# Patient Record
Sex: Female | Born: 1980 | Race: White | Hispanic: No | Smoking: Former smoker
Health system: Southern US, Community
[De-identification: ages and names within clinical notes are randomized; demographics above are authoritative.]

## PROBLEM LIST (undated history)

## (undated) ENCOUNTER — Inpatient Hospital Stay (HOSPITAL_COMMUNITY): Payer: Self-pay

## (undated) DIAGNOSIS — O24419 Gestational diabetes mellitus in pregnancy, unspecified control: Secondary | ICD-10-CM

## (undated) DIAGNOSIS — F419 Anxiety disorder, unspecified: Secondary | ICD-10-CM

## (undated) DIAGNOSIS — E119 Type 2 diabetes mellitus without complications: Secondary | ICD-10-CM

## (undated) DIAGNOSIS — O139 Gestational [pregnancy-induced] hypertension without significant proteinuria, unspecified trimester: Secondary | ICD-10-CM

## (undated) DIAGNOSIS — I499 Cardiac arrhythmia, unspecified: Secondary | ICD-10-CM

## (undated) DIAGNOSIS — R319 Hematuria, unspecified: Secondary | ICD-10-CM

## (undated) DIAGNOSIS — K8020 Calculus of gallbladder without cholecystitis without obstruction: Secondary | ICD-10-CM

## (undated) DIAGNOSIS — F329 Major depressive disorder, single episode, unspecified: Secondary | ICD-10-CM

## (undated) DIAGNOSIS — N809 Endometriosis, unspecified: Secondary | ICD-10-CM

## (undated) DIAGNOSIS — Z3490 Encounter for supervision of normal pregnancy, unspecified, unspecified trimester: Principal | ICD-10-CM

## (undated) DIAGNOSIS — D649 Anemia, unspecified: Secondary | ICD-10-CM

## (undated) DIAGNOSIS — M549 Dorsalgia, unspecified: Secondary | ICD-10-CM

## (undated) DIAGNOSIS — R110 Nausea: Secondary | ICD-10-CM

## (undated) DIAGNOSIS — F32A Depression, unspecified: Secondary | ICD-10-CM

## (undated) DIAGNOSIS — J069 Acute upper respiratory infection, unspecified: Secondary | ICD-10-CM

## (undated) DIAGNOSIS — T8859XA Other complications of anesthesia, initial encounter: Secondary | ICD-10-CM

## (undated) DIAGNOSIS — O9989 Other specified diseases and conditions complicating pregnancy, childbirth and the puerperium: Secondary | ICD-10-CM

## (undated) DIAGNOSIS — T4145XA Adverse effect of unspecified anesthetic, initial encounter: Secondary | ICD-10-CM

## (undated) DIAGNOSIS — R51 Headache: Secondary | ICD-10-CM

## (undated) DIAGNOSIS — Z87442 Personal history of urinary calculi: Secondary | ICD-10-CM

## (undated) DIAGNOSIS — N926 Irregular menstruation, unspecified: Secondary | ICD-10-CM

## (undated) DIAGNOSIS — O26851 Spotting complicating pregnancy, first trimester: Secondary | ICD-10-CM

## (undated) HISTORY — DX: Encounter for supervision of normal pregnancy, unspecified, unspecified trimester: Z34.90

## (undated) HISTORY — DX: Nausea: R11.0

## (undated) HISTORY — DX: Other specified diseases and conditions complicating pregnancy, childbirth and the puerperium: O99.89

## (undated) HISTORY — DX: Spotting complicating pregnancy, first trimester: O26.851

## (undated) HISTORY — DX: Gestational (pregnancy-induced) hypertension without significant proteinuria, unspecified trimester: O13.9

## (undated) HISTORY — DX: Hematuria, unspecified: R31.9

## (undated) HISTORY — DX: Dorsalgia, unspecified: M54.9

## (undated) HISTORY — PX: WISDOM TOOTH EXTRACTION: SHX21

## (undated) HISTORY — DX: Anemia, unspecified: D64.9

## (undated) HISTORY — PX: MULTIPLE TOOTH EXTRACTIONS: SHX2053

## (undated) HISTORY — DX: Cardiac arrhythmia, unspecified: I49.9

## (undated) HISTORY — DX: Type 2 diabetes mellitus without complications: E11.9

---

## 2000-07-26 ENCOUNTER — Encounter: Payer: Self-pay | Admitting: Family Medicine

## 2000-07-26 ENCOUNTER — Ambulatory Visit (HOSPITAL_COMMUNITY): Admission: RE | Admit: 2000-07-26 | Discharge: 2000-07-26 | Payer: Self-pay | Admitting: Family Medicine

## 2000-11-27 ENCOUNTER — Other Ambulatory Visit: Admission: RE | Admit: 2000-11-27 | Discharge: 2000-11-27 | Payer: Self-pay | Admitting: *Deleted

## 2001-02-18 ENCOUNTER — Emergency Department (HOSPITAL_COMMUNITY): Admission: EM | Admit: 2001-02-18 | Discharge: 2001-02-18 | Payer: Self-pay | Admitting: Internal Medicine

## 2001-04-10 ENCOUNTER — Emergency Department (HOSPITAL_COMMUNITY): Admission: EM | Admit: 2001-04-10 | Discharge: 2001-04-10 | Payer: Self-pay | Admitting: Emergency Medicine

## 2001-09-19 ENCOUNTER — Emergency Department (HOSPITAL_COMMUNITY): Admission: EM | Admit: 2001-09-19 | Discharge: 2001-09-19 | Payer: Self-pay | Admitting: Emergency Medicine

## 2001-09-19 ENCOUNTER — Encounter: Payer: Self-pay | Admitting: Emergency Medicine

## 2001-09-23 ENCOUNTER — Encounter: Payer: Self-pay | Admitting: Family Medicine

## 2001-09-23 ENCOUNTER — Ambulatory Visit (HOSPITAL_COMMUNITY): Admission: RE | Admit: 2001-09-23 | Discharge: 2001-09-23 | Payer: Self-pay | Admitting: Family Medicine

## 2002-03-20 ENCOUNTER — Ambulatory Visit (HOSPITAL_COMMUNITY): Admission: RE | Admit: 2002-03-20 | Discharge: 2002-03-20 | Payer: Self-pay | Admitting: Family Medicine

## 2002-03-20 ENCOUNTER — Encounter: Payer: Self-pay | Admitting: Family Medicine

## 2002-08-31 ENCOUNTER — Encounter: Payer: Self-pay | Admitting: Family Medicine

## 2002-08-31 ENCOUNTER — Ambulatory Visit (HOSPITAL_COMMUNITY): Admission: RE | Admit: 2002-08-31 | Discharge: 2002-08-31 | Payer: Self-pay | Admitting: Family Medicine

## 2003-01-13 ENCOUNTER — Emergency Department (HOSPITAL_COMMUNITY): Admission: EM | Admit: 2003-01-13 | Discharge: 2003-01-13 | Payer: Self-pay | Admitting: Emergency Medicine

## 2003-09-03 ENCOUNTER — Ambulatory Visit (HOSPITAL_COMMUNITY): Admission: RE | Admit: 2003-09-03 | Discharge: 2003-09-03 | Payer: Self-pay | Admitting: *Deleted

## 2003-09-17 ENCOUNTER — Ambulatory Visit (HOSPITAL_COMMUNITY): Admission: RE | Admit: 2003-09-17 | Discharge: 2003-09-17 | Payer: Self-pay | Admitting: *Deleted

## 2003-11-15 ENCOUNTER — Ambulatory Visit (HOSPITAL_COMMUNITY): Admission: AD | Admit: 2003-11-15 | Discharge: 2003-11-15 | Payer: Self-pay | Admitting: *Deleted

## 2003-12-10 ENCOUNTER — Ambulatory Visit (HOSPITAL_COMMUNITY): Admission: AD | Admit: 2003-12-10 | Discharge: 2003-12-10 | Payer: Self-pay | Admitting: *Deleted

## 2004-01-21 ENCOUNTER — Ambulatory Visit (HOSPITAL_COMMUNITY): Admission: AD | Admit: 2004-01-21 | Discharge: 2004-01-21 | Payer: Self-pay | Admitting: *Deleted

## 2004-01-24 ENCOUNTER — Inpatient Hospital Stay (HOSPITAL_COMMUNITY): Admission: AD | Admit: 2004-01-24 | Discharge: 2004-01-26 | Payer: Self-pay | Admitting: *Deleted

## 2004-06-11 ENCOUNTER — Emergency Department (HOSPITAL_COMMUNITY): Admission: EM | Admit: 2004-06-11 | Discharge: 2004-06-11 | Payer: Self-pay | Admitting: Family Medicine

## 2004-10-27 ENCOUNTER — Ambulatory Visit (HOSPITAL_COMMUNITY): Admission: RE | Admit: 2004-10-27 | Discharge: 2004-10-27 | Payer: Self-pay | Admitting: *Deleted

## 2005-01-23 ENCOUNTER — Ambulatory Visit (HOSPITAL_COMMUNITY): Admission: AD | Admit: 2005-01-23 | Discharge: 2005-01-23 | Payer: Self-pay | Admitting: *Deleted

## 2005-01-23 ENCOUNTER — Ambulatory Visit (HOSPITAL_COMMUNITY): Admission: RE | Admit: 2005-01-23 | Discharge: 2005-01-23 | Payer: Self-pay | Admitting: *Deleted

## 2005-02-25 ENCOUNTER — Ambulatory Visit (HOSPITAL_COMMUNITY): Admission: RE | Admit: 2005-02-25 | Discharge: 2005-02-25 | Payer: Self-pay | Admitting: *Deleted

## 2005-03-09 ENCOUNTER — Inpatient Hospital Stay (HOSPITAL_COMMUNITY): Admission: AD | Admit: 2005-03-09 | Discharge: 2005-03-11 | Payer: Self-pay | Admitting: *Deleted

## 2006-04-22 ENCOUNTER — Emergency Department (HOSPITAL_COMMUNITY): Admission: EM | Admit: 2006-04-22 | Discharge: 2006-04-22 | Payer: Self-pay | Admitting: Emergency Medicine

## 2006-05-17 ENCOUNTER — Emergency Department (HOSPITAL_COMMUNITY): Admission: EM | Admit: 2006-05-17 | Discharge: 2006-05-17 | Payer: Self-pay | Admitting: Emergency Medicine

## 2006-05-18 ENCOUNTER — Ambulatory Visit (HOSPITAL_COMMUNITY): Admission: RE | Admit: 2006-05-18 | Discharge: 2006-05-18 | Payer: Self-pay | Admitting: Family Medicine

## 2006-06-01 ENCOUNTER — Emergency Department (HOSPITAL_COMMUNITY): Admission: EM | Admit: 2006-06-01 | Discharge: 2006-06-01 | Payer: Self-pay | Admitting: Emergency Medicine

## 2006-07-05 ENCOUNTER — Ambulatory Visit (HOSPITAL_COMMUNITY): Admission: RE | Admit: 2006-07-05 | Discharge: 2006-07-05 | Payer: Self-pay | Admitting: Family Medicine

## 2006-08-06 ENCOUNTER — Ambulatory Visit (HOSPITAL_COMMUNITY): Admission: RE | Admit: 2006-08-06 | Discharge: 2006-08-06 | Payer: Self-pay | Admitting: Family Medicine

## 2007-01-19 ENCOUNTER — Inpatient Hospital Stay (HOSPITAL_COMMUNITY): Admission: AD | Admit: 2007-01-19 | Discharge: 2007-01-19 | Payer: Self-pay | Admitting: Obstetrics & Gynecology

## 2007-01-20 ENCOUNTER — Other Ambulatory Visit: Admission: RE | Admit: 2007-01-20 | Discharge: 2007-01-20 | Payer: Self-pay | Admitting: Obstetrics and Gynecology

## 2007-03-24 ENCOUNTER — Ambulatory Visit (HOSPITAL_COMMUNITY): Admission: RE | Admit: 2007-03-24 | Discharge: 2007-03-24 | Payer: Self-pay | Admitting: Obstetrics and Gynecology

## 2007-04-18 ENCOUNTER — Ambulatory Visit (HOSPITAL_COMMUNITY): Admission: RE | Admit: 2007-04-18 | Discharge: 2007-04-18 | Payer: Self-pay | Admitting: Obstetrics and Gynecology

## 2007-05-30 ENCOUNTER — Ambulatory Visit (HOSPITAL_COMMUNITY): Admission: RE | Admit: 2007-05-30 | Discharge: 2007-05-30 | Payer: Self-pay | Admitting: Obstetrics and Gynecology

## 2007-06-10 ENCOUNTER — Inpatient Hospital Stay (HOSPITAL_COMMUNITY): Admission: AD | Admit: 2007-06-10 | Discharge: 2007-06-11 | Payer: Self-pay | Admitting: Obstetrics & Gynecology

## 2007-06-11 ENCOUNTER — Ambulatory Visit: Payer: Self-pay | Admitting: *Deleted

## 2007-06-27 ENCOUNTER — Ambulatory Visit (HOSPITAL_COMMUNITY): Admission: RE | Admit: 2007-06-27 | Discharge: 2007-06-27 | Payer: Self-pay | Admitting: Obstetrics and Gynecology

## 2007-06-29 ENCOUNTER — Ambulatory Visit: Payer: Self-pay | Admitting: Obstetrics and Gynecology

## 2007-06-29 ENCOUNTER — Inpatient Hospital Stay (HOSPITAL_COMMUNITY): Admission: AD | Admit: 2007-06-29 | Discharge: 2007-06-29 | Payer: Self-pay | Admitting: Obstetrics & Gynecology

## 2007-07-11 ENCOUNTER — Ambulatory Visit (HOSPITAL_COMMUNITY): Admission: RE | Admit: 2007-07-11 | Discharge: 2007-07-11 | Payer: Self-pay | Admitting: Obstetrics and Gynecology

## 2007-07-26 ENCOUNTER — Emergency Department (HOSPITAL_COMMUNITY): Admission: EM | Admit: 2007-07-26 | Discharge: 2007-07-27 | Payer: Self-pay | Admitting: Emergency Medicine

## 2007-08-08 ENCOUNTER — Ambulatory Visit (HOSPITAL_COMMUNITY): Admission: RE | Admit: 2007-08-08 | Discharge: 2007-08-08 | Payer: Self-pay | Admitting: Obstetrics and Gynecology

## 2007-08-15 ENCOUNTER — Ambulatory Visit: Payer: Self-pay | Admitting: Obstetrics and Gynecology

## 2007-08-15 ENCOUNTER — Encounter: Payer: Self-pay | Admitting: Obstetrics and Gynecology

## 2007-08-15 ENCOUNTER — Inpatient Hospital Stay (HOSPITAL_COMMUNITY): Admission: AD | Admit: 2007-08-15 | Discharge: 2007-08-15 | Payer: Self-pay | Admitting: Obstetrics and Gynecology

## 2007-08-18 ENCOUNTER — Ambulatory Visit: Payer: Self-pay | Admitting: Obstetrics and Gynecology

## 2007-08-18 ENCOUNTER — Inpatient Hospital Stay (HOSPITAL_COMMUNITY): Admission: AD | Admit: 2007-08-18 | Discharge: 2007-08-20 | Payer: Self-pay | Admitting: Obstetrics & Gynecology

## 2007-08-18 DIAGNOSIS — O24419 Gestational diabetes mellitus in pregnancy, unspecified control: Secondary | ICD-10-CM

## 2009-03-30 ENCOUNTER — Emergency Department (HOSPITAL_COMMUNITY): Admission: EM | Admit: 2009-03-30 | Discharge: 2009-03-30 | Payer: Self-pay | Admitting: Family Medicine

## 2009-11-22 ENCOUNTER — Emergency Department (HOSPITAL_COMMUNITY): Admission: EM | Admit: 2009-11-22 | Discharge: 2009-11-22 | Payer: Self-pay | Admitting: Family Medicine

## 2010-08-22 NOTE — H&P (Signed)
NAMEJONNELL, HENTGES               ACCOUNT NO.:  [id number]   MEDICAL RECORD NO.:  [id number]          PATIENT TYPE:  INP   LOCATION:  9113                          FACILITY:  [facility]   PHYSICIAN:  Tilda Burrow, M.D. DATE OF BIRTH:  12/09/1980   DATE OF ADMISSION:  08/18/2007  DATE OF DISCHARGE:                              HISTORY & PHYSICAL   ADMITTING DIAGNOSIS:  Pregnancy at 37 weeks and 4 days, early labor.   HISTORY OF PRESENT ILLNESS:  This 30 year old gravida 5, para 3-0-1-3,  LMP October 12, 2006,  with [facility] Sep 04, 2007, is admitted after a pregnancy  course followed through our office at [facility].  She has been  seen through 14 prenatal visits with blood type A negative, antibody  screen negative, hemoglobin 14, and hematocrit 45; hepatitis C, HIV,  RPR, GC, and chlamydia all negative; rubella immunity present, and  varicella immunity present.  She has a negative group B strep, and  negative repeat gonorrhea and chlamydia cultures.  A 20-week glucose  tolerance test 119 mg/dL.  Today, she presents to our office with  prodromal labor symptoms.  She has been seen in labor in the MAU last  week throughout labor.  Today, she is progressing 3-4 cm.  Her membranes  are intact.  She is referred to [facility] at [facility] for labor  management with cervix 4 cm, 50, -1.   PAST MEDICAL HISTORY:  Benign.   SURGICAL HISTORY:  Negative.   She had pregnancy course also notable for detailed fetal survey due to  an increased risk of ONTD based on the MSAFP.  This fetal screen was  negative for anomalies.  Recheck was similarly negative.  The patient is  admitted for labor management.   PHYSICAL EXAMINATION:  VITAL SIGNS:  Height 5 feet 8 inches, weight 187,  and blood pressure 132/88.   LABORATORY DATA:  Urinalysis negative.  NST reactive.  Cervix 4 cm, 50,  -1.   PLAN:  Admit to [facility] at [facility] with good prognosis for  vaginal delivery promptly.   ADDENDUM  The patient is uncertain regarding tubal ligation plans versus  vasectomy.      Tilda Burrow, M.D.  Electronically Signed     JVF/MEDQ  D:  08/18/2007  T:  08/19/2007  Job:  [id number]   cc:   [facility]

## 2010-08-25 NOTE — Op Note (Signed)
[name], BENTLY               ACCOUNT NO.:  [id number]   MEDICAL RECORD NO.:  [id number]          PATIENT TYPE:  INP   LOCATION:                                FACILITY:  [facility]   PHYSICIAN:  Langley Gauss, M.D.   DATE OF BIRTH:  1980-11-13   DATE OF PROCEDURE:  DATE OF DISCHARGE:                                 OPERATIVE REPORT   PROCEDURE:  Placement of epidural analgesia at L3-L4 interspace performed by  Dr. Roylene Reason. Lisette Grinder.   COMPLICATIONS:  None.   DESCRIPTION OF PROCEDURE:  An appropriate and informed consent was obtained.  The patient had had epidural placed with previous labor and deliveries.  She  was placed in the seated position.  Immediately prior the cervix had been  examined, 3 cm dilated, 70% effaced, 0 station, vertex presentation  confirmed.  Fetal scalp electrode placed with the resultant amniotomy and  findings of clear amniotic fluid.   In a seated position bony landmarks were identified.  The back is sterilely  prepped and draped utilizing the epidural kit; 5 cc of 1% lidocaine injected  at the midline of the L3-L4 interspace.  A 17-gauge Touhy-Schliff needle was  then utilized with loss of resistance in air-filled glass syringe to  identify entry into the epidural space on the first attempt without  difficulty.  Initial test dose of 5 cc of 1.5% lidocaine plus epinephrine  injected through the epidural needle.  No signs of CSF or intravascular  injection.  A second test dose of 2 cc of 1.5% lidocaine plus epinephrine  injected through the epidural needle after insertion of the catheter to a  depth of 5 cm.  Aspiration test is negative. Catheter is secured into place.  The patient is having evidence of a bilateral block in place.   She is thus connected to the infusion pump with the standard mixture.  She  will be started on continuous infusion of 14 cc/hour.  Upon return to the  lateral supine position the patient does have evidence of a bilateral  block  in place.       ___________________________________________  Langley Gauss, M.D.    DC/MEDQ  D:  01/24/2004  T:  01/24/2004  Job:  [id number]

## 2010-08-25 NOTE — Op Note (Signed)
[name], [name]               ACCOUNT NO.:  [id number]   MEDICAL RECORD NO.:  [id number]          PATIENT TYPE:  INP   LOCATION:  LDR1                          FACILITY:  [facility]   PHYSICIAN:  Langley Gauss, MD     DATE OF BIRTH:  1980-05-10   DATE OF PROCEDURE:  03/09/2005  DATE OF DISCHARGE:                                 OPERATIVE REPORT   PROCEDURE:  Placement of continuous lumbar epidural analgesia at the L3-4  interspace, performed by Dr. Roylene Reason. Lisette Grinder.   COMPLICATIONS:  None.   SPECIMENS:  None.   EPIDURAL START TIME:  1330 hours.   EPIDURAL DISCONTINUATION TIME:  1830 hours.   SUMMARY:  Appropriate informed consent was obtained.  The patient had  epidural placed with previous labor and delivery.  She is placed in the  seated position, at which time bony landmarks are identified.  The L3-4  interspace is chosen.  The patient's back is sterilely prepped and draped,  utilizing epidural tray, and 5 cc 1% lidocaine injected at the midline of  the L3-4 interspace, __________ small skin wheal.  The 17 gauge Tuohy-Schiff  needle is then utilized with loss of resistance in an air-filled glass  syringe to identify and use the atraumatic entry into the epidural space on  the first attempt without difficulty.  Excellent loss of resistance is  noted.  Initial test dose 5 cc, 1.5% lidocaine plus epinephrine injected  through the epidural needle.  No signs of CSF or intravascular injection  obtained.  Thus, the catheter is inserted to a depth of 5 cm.  The needle is  removed.  Aspiration test is negative.  Second test dose 2 cc, 1.5%  lidocaine plus epinephrine injected through the epidural catheter.  Again,  no signs of CSF or intravascular injection obtained.  The catheter is  secured into place.  The patient is having an obvious block setting up on  the right side, but the left side is slower to respond.  The patient is  connected to the infusion pump, contained in a  standard mixture at an  infusion rate of 14 cc/hr and returned to the lateral supine position.  Fetal heart rates remained reassuring throughout the procedure.      Langley Gauss, MD  Electronically Signed     DC/MEDQ  D:  03/09/2005  T:  03/09/2005  Job:  [id number]

## 2010-08-25 NOTE — H&P (Signed)
[name], [name]               ACCOUNT NO.:  [id number]   MEDICAL RECORD NO.:  [id number]           PATIENT TYPE:   LOCATION:                                 FACILITY:   PHYSICIAN:  Langley Gauss, M.D.        DATE OF BIRTH:   DATE OF ADMISSION:  01/24/2004  DATE OF DISCHARGE:  LH                                HISTORY & PHYSICAL   HISTORY OF PRESENT ILLNESS:  This is a 30 year old gravida 2, para 1, at 38+  weeks gestation.  The patient is admitted for induction of labor due to  psychosocial factors.  Her husband, Bernadene Bell, works for [employer].  He has made special arrangements to be available in town for the  labor and delivery, where as the remainder of his work crew has currently  traveled to [place] to repair storm damage.  The patient's prenatal course  was complicated by some uncertainty regarding her date as her last menstrual  period was on arrival.  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  She was treated with Clomid x1 month duration, but  failed to conceive during that cycle.  Subsequently, she was able to  conceive on the cycle following the Clomid induced cycle.  She did have some  first trimester nausea with resultant weight loss, but overall was able to  gain 18 pounds during the entirety of the pregnancy.  She is noted to be A  negative blood type.  She did receive full dose RhoGAM dated November 02, 2003.  She otherwise has had ultrasounds which have shown documented normal  anatomic survey and good fetal growth.  She is noted to be hepatitis  negative, RPR is non-reactive, HIV is negative.  GC and Chlamydia cultures  negative.  Pap class I dated Aug 09, 2003.   PAST OBSTETRICAL HISTORY:  She had a vaginal delivery on July 28, 1999, 6  pound 4 ounce female infant.  She utilized epidural analgesic, delivered  over a first degree perineal laceration at that time.   ALLERGIES:  No known drug allergies.   CURRENT  MEDICATIONS:  Prenatal vitamins only.   PHYSICAL EXAMINATION:  GENERAL:  A pleasant white female.  VITAL SIGNS:  Weight is 178.5 pounds, blood pressure 120/82, pulse rate of  80, respiratory rate is 20.  HEENT:  Negative.  NECK:  No adenopathy, supple.  Thyroid is not palpable.  LUNGS:  Clear.  CARDIOVASCULAR:  Regular rate and rhythm.  ABDOMEN:  Soft and nontender.  No surgical scars are identified.  She has a  vertex presentation by Leopold's maneuvers.  The fetal heart tones are  auscultated in the 150s.  EXTREMITIES:  Normal.  PELVIC:  Normal external genitalia, no lesions or ulcerations identified.  No vaginal bleeding, no leakage of fluid.  Cervix noted to be 1+ cm dilated,  0 station, 60% effaced, with the vertex well applied.   LABORATORY DATA:  GBS carrier status is negative.   SOCIAL HISTORY:  The patient does plan on both breast-feeding and bottle-  feeding.  She would like to utilize either pills or patch for postpartum  birth control purposes.  She will be utilizing the pediatrician on call and  subsequent care provided at [facility].   ASSESSMENT AND PLAN:  A 38+ weeks intrauterine pregnancy.  Previous labor  and delivery went very well utilizing epidural analgesic.   PLAN:  I am admitting the patient on January 24, 2004.  Subsequently,  amniotomy can be performed.  The patient can be managed expectantly  subsequently thereafter augmentation or induction with Pitocin if clinically  indicated.  The patient had an epidural placed with previous labor and  delivery, and plans on utilizing epidural during this course of labor and  delivery.       ___________________________________________  Langley Gauss, M.D.    DC/MEDQ  D:  01/20/2004  T:  01/20/2004  Job:  [id number]

## 2010-08-25 NOTE — Op Note (Signed)
[name], [name]               ACCOUNT NO.:  [id number]   MEDICAL RECORD NO.:  [id number]          PATIENT TYPE:  INP   LOCATION:  A412                          FACILITY:  [facility]   PHYSICIAN:  Langley Gauss, MD     DATE OF BIRTH:  [birth year]   DATE OF PROCEDURE:  03/09/2005  DATE OF DISCHARGE:                                 OPERATIVE REPORT   DELIVERY NOTE:   DIAGNOSIS:  A 39-week intrauterine pregnancy for induction of labor.   PROCEDURES:  1.  Placement of continuous lumbar epidural analgesia.  2.  Spontaneous, assisted vaginal delivery of 7-pounds 10-ounce female      infant, delivered over an intact perineum.   ATTENDING PHYSICIAN:  Delivery performed by Dr. Langley Gauss.   ESTIMATED BLOOD LOSS:  Less than 500 cc.   COMPLICATIONS:  None.   SPECIMENS:  Arterial cord gas and cord blood to laboratory. The placenta is  examined, noted be apparently intact, with a three-vessel umbilical cord.   FINDINGS AT TIME OF DELIVERY:  A nuchal cord x1, which had resulted in some  mild variable decelerations during the second phase of labor.   SUMMARY:  The patient is admitted for induction of labor at 87 weeks'  gestation. On initial examination, she was noted to be 3-cm dilated, 70%  effaced, -1 station with the vertex well applied to the cervix, amniotomy  was performed at that time with findings of clear amniotic fluid. Fetal  scalp electrode was placed. The patient did have a reassuring fetal heart  rate. The patient planned on having an epidural placed with this labor and  delivery, as she had with two previous.  She has had a history of a very  rapid labor and delivery, so very shortly after starting the Pitocin, the  epidural was placed.  The epidural provided complete analgesia on the right.  The patient did have still some pain experienced on her left side.  She,  however, did progress rapidly to complete dilatation.  She was pushing in  the labor pad at complete  dilatation, pushing rather poorly with minimal  effort and minimal descent. Thus, I placed her in the dorsal lithotomy  position at which time her expulsive efforts improved.  She pushed well at  this time during this second stage of labor to delivery in a direct O-A  position over an intact perineum.   Mouth and nares were bulb-suctioned of clear amniotic fluid.  Renewed,  expulsive efforts resulted in spontaneous rotation to her right anterior  shoulder position. A nuchal cord x1 is reduced prior to delivery of the  shoulders. Gentle, downward traction combined with expulsive efforts,  resulted in delivery of this anterior shoulder as well as the remainder of  the infant without difficulty.  Umbilical cord is milked toward the infant,  cord is doubly clamped and cut.  Infant is placed on maternal abdomen for  immediate bonding purposes. Excellent uterine tone is noted. Arterial cord  gas and cord blood were then obtained.  Gentle traction on the umbilical  cord results in separation, which upon examination,  appears to be an intact  placenta with associated three-vessel umbilical cord. Again, excellent  uterine tone is still achieved.  Examination of the genital tract, at this  time, reveals a very minimal first-degree perineal laceration.  No other  lacerations are noted.  No episiotomy was performed.  The mother and infant  both doing well following delivery.  The patient is taken out dorsal  lithotomy position, and rolled to her side, at which time the epidural  catheter was removed, with the boot tip noted to be intact.      Langley Gauss, MD  Electronically Signed     DC/MEDQ  D:  03/09/2005  T:  03/10/2005  Job:  [id number]

## 2010-08-25 NOTE — H&P (Signed)
[name], [name]               ACCOUNT NO.:  [id number]   MEDICAL RECORD NO.:  [id number]          PATIENT TYPE:  INP   LOCATION:                                FACILITY:  [facility]   PHYSICIAN:  Langley Gauss, MD     DATE OF BIRTH:  [birth year]   DATE OF ADMISSION:  03/09/2005  DATE OF DISCHARGE:  LH                                HISTORY & PHYSICAL   The patient is a 30 year old gravida 4, para 2 at 38-1/[redacted] weeks gestation who  is admitted for induction of labor secondary to psychosocial factors.  Pertinently, the patient is noted to have two other living children who will  require child care. Additionally, she would like to schedule such that her  husband who frequently travels out of town to work on an emergency disaster  clean-up crew to be present at time of labor and delivery. Her cervix is  noted to be very favorable.   The patient's prenatal course. She is noted to be O negative. She did  receive full dose RhoGAM December 08, 2004. She has had serial ultrasounds  which have documented adequate fetal growth. GBS carrier status is negative.  Serial ultrasounds documented normal anatomical survey and adequate fetal  growth. Glucose tolerance test was normal in 2001. She has no high-risk  factors for abnormal GTT. Thus, it is not repeated during this pregnancy.   PAST MEDICAL HISTORY:  She has no known drug allergies.   SOCIAL HISTORY:  She is described as a homemaker, a nonsmoker. Her husband,  Bernadene Bell, works for [employer].   OBSTETRICAL HISTORY:  July 10, 1999, vaginal delivery 6-pound 4-ounce infant  utilizing epidural. January 24, 2004, vaginal delivery 6-pound 10.4-ounce  infant delivered utilizing epidural; that epidural was suboptimally  functioning as the catheter was noted to be kinked with limited flow of  medication during the active course of labor. She has no other medical or  surgical history. She does plan on both bottle and breast feeding. She  would  like to use Dr. Milford Cage for newborn pediatric services. She declines  sterilization, would like to use oral contraceptives for postpartum birth  control purposes.   PHYSICAL EXAMINATION:  VITAL SIGNS:  Prepregnancy 170, most recently is 187.  Blood pressure is 128/82.  HEENT:  Negative. No adenopathy. Neck is supple. Thyroid is nonpalpable.  LUNGS:  Clear.  CARDIOVASCULAR:  Regular rate and rhythm.  ABDOMEN:  Soft and nontender. No surgical scars are identified. She is  vertex presentation by Leopold's maneuvers. Fundal height is 36 cm. Fetal  tones are auscultated in the 150s.  EXTREMITIES:  Minimal edema identified.  PELVIC:  Normal external genitalia. No lesions or ulcerations identified. No  vaginal bleeding. No leakage of fluid. Pelvis is clinical adequate on  examination. Cervix is noted to be nearly 3 cm dilated, 80% effaced, with  the vertex well applied at a 0 station.   ASSESSMENT/PLAN:  A 38-1/2 week intrauterine pregnancy, very favorable  cervix. Additionally, the patient is noted to have history of very rapid  labor and delivery with previous  delivery occurring fours hours after  amniotomy. The patient to be referred to [facility] March 09, 2005. Can  initially proceed with amniotomy and then observe  patient for onset of active labor. If active labor does not ensue, can  augment or induce with Pitocin as clinically indicated. As per two previous  labor and deliveries, the patient very well may request epidural during this  course of labor and delivery.      Langley Gauss, MD  Electronically Signed     DC/MEDQ  D:  03/06/2005  T:  03/06/2005  Job:  [id number]

## 2010-08-25 NOTE — Op Note (Signed)
NAMERHODIA, ACRES               ACCOUNT NO.:  [id number]   MEDICAL RECORD NO.:  [id number]          PATIENT TYPE:  INP   LOCATION:  A401                          FACILITY:  [facility]   PHYSICIAN:  Langley Gauss, M.D.   DATE OF BIRTH:  02-17-1981   DATE OF PROCEDURE:  01/24/2004  DATE OF DISCHARGE:                                 OPERATIVE REPORT   DELIVERY NOTE:   PROCEDURES:  1.  Placement of continuous lumbar epidural analgesia.  2.  Spontaneous assisted vaginal delivery of a viable, vigorous female      infant, weight unavailable, delivered over an intact perineum.   Analgesia at time of delivery, a total of 5, 5 mL 2% lidocaine plane was  injected through the epidural.   COMPLICATIONS:  She did have some moderate variable decelerations during the  second stage of labor.  At time of delivery, a nuchal cord x2 was noted,  which was reduced.   ESTIMATED BLOOD LOSS:  Less than 500 mL.   SPECIMENS:  Arterial cord gas and cord blood to laboratory.  The placenta  was examined and noted to be apparently intact with a three-vessel umbilical  cord.   SUMMARY:  The patient had been admitted for induction of labor.  Initially  IV Pitocin induction was initiated, amniotomy performed.  Epidural was  placed without difficulty.  However, despite excellent functioning of the  epidural initially, it became apparent that during the active phase of labor  the epidural was nonfunctional for labor purposes as error message on  epidural pump was that of occlusion.  Thus, the patient was treated with IV  Nubain and Phenergan as I completed office space responsibilities.  When I  arrived to administer a bolus to the patient, she was noted to be actively  laboring, progressing very rapidly from 3 to 8 cm.  At time of evaluation  she was noted to be 9 cm dilated.  Lidocaine 2% plain 5, 5mL injected  through the epidural.  The patient is then placed in the dorsal lithotomy  position, prepped and  draped in the usual sterile manner.  Foley catheter is  removed.  The patient pushed well during the extremely short second stage of  labor with excellent perineal support.  The infant was noted to deliver in a  direct OA position over the intact perineum.  Spontaneous rotation occurred  to a left anterior shoulder position, nuchal cord x2 was reduced.  Expulsive  efforts plus gentle downward traction resulted in delivery of this anterior  shoulder as well as the remainder of the infant without difficulty.  Umbilical cord is milked toward the infant, cord is doubly clamped and cut.  The infant is placed on the maternal abdomen for immediate bonding purposes.  Arterial cord gas and cord blood then obtained.  Gentle traction on the  umbilical cord results in separation, which upon examination is intact  placenta plus a three-vessel umbilical cord.  Excellent uterine tone is  achieved.  Examination of the genital tract reveals intact perineum, no  lacerations noted.  Excellent uterine tone is  achieved.  The patient is thus taken out of the dorsal lithotomy position  and rolled to her side, at which time the epidural catheter is removed with  the blue tip noted to be intact.  Both mother and infant doing well  following delivery.      DC/MEDQ  D:  01/24/2004  T:  01/25/2004  Job:  [id number]

## 2010-08-25 NOTE — Discharge Summary (Signed)
[name], [name]               ACCOUNT NO.:  [id number]   MEDICAL RECORD NO.:  [id number]          PATIENT TYPE:  INP   LOCATION:  A401                          FACILITY:  [facility]   PHYSICIAN:  Langley Gauss, MD     DATE OF BIRTH:  05/12/1980   DATE OF ADMISSION:  01/24/2004  DATE OF DISCHARGE:  10/19/2005LH                                 DISCHARGE SUMMARY   PROCEDURES:  01/24/2004:  Placement of continuous lumbar epidural analgesia.  01/24/2004:  Spontaneous assisted vaginal delivery.   DISPOSITION:  Given a copy of standarized discharge instructions at the time  of discharge.  Follow up in the office in 4 weeks' time.   PERTINENT LABORATORY STUDIES:  Hemoglobin and hematocrit 9.9/29.0 with a  white count of 13.6; postpartum day #1:  9.8/29.4 with a white count of 9.1.   DISCHARGE MEDICATION:  Tylox #30. The patient does have significant problems  swallowing meds; however, she has done well this hospitalization with  crushing Tylox and mixing them with food.   HOSPITAL COURSE:  See previous dictations.  The patient actually delivered  on 01/24/2004.  Postpartum she did well. She had Neosporin ointment  postpartum complications; bonded well with the infant; and was thus  discharged home on postpartum day one and one-half.     [name]   DC/MEDQ  D:  02/02/2004  T:  02/02/2004  Job:  [id number]

## 2010-08-25 NOTE — Discharge Summary (Signed)
[name], [name]               ACCOUNT NO.:  [id number]   MEDICAL RECORD NO.:  [id number]          PATIENT TYPE:  INP   LOCATION:  A412                          FACILITY:  [facility]   PHYSICIAN:  Langley Gauss, MD     DATE OF BIRTH:  02/16/1981   DATE OF ADMISSION:  03/09/2005  DATE OF DISCHARGE:  12/03/2006LH                                 DISCHARGE SUMMARY   DATE OF EPIDURAL AND VAGINAL DELIVERY:  March 09, 2005.   DISPOSITION:  Patient is given a copy of standard discharge instructions at  time of discharge.  Follow up in the office in 4 weeks' time for postpartum  visit and discussion of birth control options.   PERTINENT LABORATORY STUDIES:  Admission hemoglobin and hematocrit 9.1 and  27.1; postpartum day #1, 8.4 and 25.8.   ADDITIONAL DIAGNOSIS:  Anemia secondary to iron deficiency and blood loss.   HOSPITAL COURSE:  See previous dictations.  Patient admitted on March 09, 2005 for medically indicated induction of labor.  She progressed rapidly  along the labor curve to an uncomplicated vaginal delivery utilizing  epidural analgesic.  Postpartum patient did well.  She bonded well with the  infant and had no postpartum complications and was thus discharged to home  on postpartum day one and one half.      Langley Gauss, MD  Electronically Signed     DC/MEDQ  D:  04/13/2005  T:  04/13/2005  Job:  [id number]

## 2011-01-01 LAB — CK TOTAL AND CKMB (NOT AT ARMC)
CK, MB: 1.3
Relative Index: INVALID
Total CK: 42

## 2011-01-02 LAB — DIFFERENTIAL
Basophils Absolute: 0
Basophils Relative: 0
Eosinophils Absolute: 0.1
Monocytes Absolute: 1.1 — ABNORMAL HIGH
Monocytes Relative: 10
Neutro Abs: 7.8 — ABNORMAL HIGH
Neutrophils Relative %: 71

## 2011-01-02 LAB — CBC
RBC: 3.51 — ABNORMAL LOW
RDW: 16.3 — ABNORMAL HIGH
WBC: 11.1 — ABNORMAL HIGH

## 2011-01-02 LAB — BASIC METABOLIC PANEL
Calcium: 8.7
Chloride: 104
Creatinine, Ser: 0.44

## 2011-01-02 LAB — URINE MICROSCOPIC-ADD ON

## 2011-01-02 LAB — URINALYSIS, ROUTINE W REFLEX MICROSCOPIC
Bilirubin Urine: NEGATIVE
Glucose, UA: NEGATIVE
Hgb urine dipstick: NEGATIVE
Nitrite: NEGATIVE
Specific Gravity, Urine: 1.015

## 2011-01-18 LAB — URINALYSIS, ROUTINE W REFLEX MICROSCOPIC
Nitrite: NEGATIVE
Urobilinogen, UA: 2 — ABNORMAL HIGH
pH: 6.5

## 2011-09-17 ENCOUNTER — Inpatient Hospital Stay (HOSPITAL_COMMUNITY): Payer: Medicaid Other

## 2011-09-17 ENCOUNTER — Inpatient Hospital Stay (HOSPITAL_COMMUNITY)
Admission: AD | Admit: 2011-09-17 | Discharge: 2011-09-17 | Disposition: A | Discharge status: Home / Self Care | Payer: Medicaid Other | Source: Ambulatory Visit | Attending: Obstetrics and Gynecology | Admitting: Obstetrics and Gynecology

## 2011-09-17 ENCOUNTER — Encounter (HOSPITAL_COMMUNITY): Payer: Self-pay | Admitting: *Deleted

## 2011-09-17 DIAGNOSIS — O2390 Unspecified genitourinary tract infection in pregnancy, unspecified trimester: Secondary | ICD-10-CM | POA: Insufficient documentation

## 2011-09-17 DIAGNOSIS — O2340 Unspecified infection of urinary tract in pregnancy, unspecified trimester: Secondary | ICD-10-CM

## 2011-09-17 DIAGNOSIS — Z1389 Encounter for screening for other disorder: Secondary | ICD-10-CM

## 2011-09-17 DIAGNOSIS — Z3490 Encounter for supervision of normal pregnancy, unspecified, unspecified trimester: Secondary | ICD-10-CM

## 2011-09-17 DIAGNOSIS — N390 Urinary tract infection, site not specified: Secondary | ICD-10-CM | POA: Insufficient documentation

## 2011-09-17 DIAGNOSIS — R109 Unspecified abdominal pain: Secondary | ICD-10-CM | POA: Insufficient documentation

## 2011-09-17 HISTORY — DX: Depression, unspecified: F32.A

## 2011-09-17 HISTORY — DX: Irregular menstruation, unspecified: N92.6

## 2011-09-17 HISTORY — DX: Other complications of anesthesia, initial encounter: T88.59XA

## 2011-09-17 HISTORY — DX: Adverse effect of unspecified anesthetic, initial encounter: T41.45XA

## 2011-09-17 HISTORY — DX: Acute upper respiratory infection, unspecified: J06.9

## 2011-09-17 HISTORY — DX: Headache: R51

## 2011-09-17 HISTORY — DX: Major depressive disorder, single episode, unspecified: F32.9

## 2011-09-17 LAB — POCT PREGNANCY, URINE: Preg Test, Ur: POSITIVE — AB

## 2011-09-17 LAB — HCG, QUANTITATIVE, PREGNANCY: hCG, Beta Chain, Quant, S: 22712 m[IU]/mL — ABNORMAL HIGH (ref <5)

## 2011-09-17 LAB — CBC
HCT: 37.1 % (ref 36.0–46.0)
MCV: 83.6 fL (ref 78.0–100.0)
RBC: 4.44 MIL/uL (ref 3.87–5.11)
WBC: 8 10*3/uL (ref 4.0–10.5)

## 2011-09-17 LAB — URINALYSIS, ROUTINE W REFLEX MICROSCOPIC: Nitrite: NEGATIVE

## 2011-09-17 LAB — DIFFERENTIAL
Eosinophils Relative: 1 % (ref 0–5)
Lymphocytes Relative: 17 % (ref 12–46)
Lymphs Abs: 1.4 10*3/uL (ref 0.7–4.0)
Monocytes Absolute: 0.8 10*3/uL (ref 0.1–1.0)

## 2011-09-17 LAB — URINE MICROSCOPIC-ADD ON

## 2011-09-17 MED ORDER — CEPHALEXIN 500 MG PO CAPS: 500.0000 mg | ORAL_CAPSULE | Freq: Three times a day (TID) | ORAL | Status: AC

## 2011-09-17 NOTE — MAU Provider Note (Signed)
History     CSN: [id number]  Arrival date and time: 09/17/11 0759        Chief Complaint  Patient presents with  . Abdominal Pain   HPI [name] is a 31 y.o. female @ approximately [redacted] weeks gestation who presents to MAU for abdominal pain. She had a positive home pregnancy test last week but has irregular periods so is not sure of her LMP. UTI symptoms that started a few days ago. No history of STI's.   Pertinent Gynecological History:    Past Medical History  Diagnosis Date  . Complication of anesthesia     Epidural 1 sided  . Headache   . Depression     During 1 pregnancy  . Irregular periods   . Recurrent upper respiratory infection (URI)     Mostly when pregnant    Past Surgical History  Procedure Date  . Wisdom tooth extraction     History reviewed. No pertinent family history.  History  Substance Use Topics  . Smoking status: Former Smoker    Types: Cigarettes  . Smokeless tobacco: Former Neurosurgeon    Quit date: 09/17/1998  . Alcohol Use: No    Allergies: No Known Allergies  Prescriptions prior to admission  Medication Sig Dispense Refill  . acetaminophen (TYLENOL) 160 MG/5ML suspension Take 325 mg by mouth every 4 (four) hours as needed. For pain/headache      . cetirizine (ZYRTEC) 10 MG tablet Take 10 mg by mouth daily as needed. For allergies      . fluticasone (FLONASE) 50 MCG/ACT nasal spray Place 2 sprays into the nose daily as needed. For allergies        Review of Systems  Constitutional: Negative for fever, chills, weight loss and malaise/fatigue.  HENT: Negative for ear pain, nosebleeds, congestion and sore throat.   Eyes: Negative for blurred vision, double vision and photophobia.  Respiratory: Negative for cough and wheezing.   Cardiovascular: Negative for chest pain, palpitations and leg swelling.          Gastrointestinal: Positive for nausea and abdominal pain. Negative for vomiting, diarrhea and constipation.  Genitourinary:  Positive for dysuria, urgency and frequency. Negative for flank pain.  Musculoskeletal: Positive for back pain.  Skin: Negative.   Neurological: Positive for dizziness and headaches. Negative for seizures.  Psychiatric/Behavioral: Negative for depression. The patient is not nervous/anxious.       Blood pressure 136/82, pulse 95, temperature 98.5 F (36.9 C), temperature source Oral, resp. rate 18, height 5\' 5"  (1.651 m), weight 177 lb (80.287 kg), last menstrual period 07/19/2011.  Physical Exam  Nursing note and vitals reviewed. Constitutional: She is oriented to person, place, and time. She appears well-developed and well-nourished. No distress.  HENT:  Head: Normocephalic.  Eyes: EOM are normal.  Neck: Neck supple.  Cardiovascular: Normal rate.   Respiratory: Effort normal.  GI: Soft. There is tenderness in the suprapubic area.  Genitourinary:       External genitalia without lesions. Yellow discharge vaginal vault. Cervix with irritation, closed, mild CMT, bilateral adnexal tenderness that is mild. Uterus without palpable enlargement.  Musculoskeletal: Normal range of motion. She exhibits no edema.  Neurological: She is alert and oriented to person, place, and time.  Skin: Skin is warm and dry.  Psychiatric: She has a normal mood and affect. Her behavior is normal. Judgment and thought content normal.   Results for orders placed during the hospital encounter of 09/17/11 (from the past 24 hour(s))  URINALYSIS, ROUTINE W REFLEX MICROSCOPIC     Status: Abnormal   Collection Time   09/17/11  8:20 AM      Component Value Range   Color, Urine YELLOW  YELLOW    APPearance HAZY (*) CLEAR    Specific Gravity, Urine 1.025  1.005 - 1.030    pH 6.5  5.0 - 8.0    Glucose, UA NEGATIVE  NEGATIVE (mg/dL)   Hgb urine dipstick NEGATIVE  NEGATIVE    Bilirubin Urine NEGATIVE  NEGATIVE    Ketones, ur NEGATIVE  NEGATIVE (mg/dL)   Protein, ur NEGATIVE  NEGATIVE (mg/dL)   Urobilinogen, UA 0.2   0.0 - 1.0 (mg/dL)   Nitrite NEGATIVE  NEGATIVE    Leukocytes, UA MODERATE (*) NEGATIVE   URINE MICROSCOPIC-ADD ON     Status: Abnormal   Collection Time   09/17/11  8:20 AM      Component Value Range   Squamous Epithelial / LPF MANY (*) RARE    WBC, UA 7-10  <3 (WBC/hpf)   Bacteria, UA FEW (*) RARE   POCT PREGNANCY, URINE     Status: Abnormal   Collection Time   09/17/11  8:33 AM      Component Value Range   Preg Test, Ur POSITIVE (*) NEGATIVE   WET PREP, GENITAL     Status: Abnormal   Collection Time   09/17/11  9:13 AM      Component Value Range   Yeast Wet Prep HPF POC NONE SEEN  NONE SEEN    Trich, Wet Prep NONE SEEN  NONE SEEN    Clue Cells Wet Prep HPF POC NONE SEEN  NONE SEEN    WBC, Wet Prep HPF POC TOO NUMEROUS TO COUNT (*) NONE SEEN    MAU Course  Procedures Ultrasound shows a 6 week IUGS with YS and FP, cardiac activity at 120 bpm.    Assessment and Plan  31 y.o. female with IUP @ [redacted] weeks gestation UTI in pregnancy  Rx Keflex GC, Chlamydia, Urine cultures pending Start prenatal care Return as needed  Evelena Masci 09/17/2011, 10:12 AM

## 2011-09-17 NOTE — MAU Note (Signed)
Every morning when she wakes up, her stomach has been hurting, hurts when she goes to the bathroom and  Her urine has been real dark.

## 2011-09-17 NOTE — Discharge Instructions (Signed)
    ________________________________________     To schedule your Maternity Eligibility Appointment, please call [phone].  When you arrive for your appointment you must bring the following items or information listed below.  Your appointment will be rescheduled if you do not have these items or are 15 minutes late. If currently receiving Medicaid, you MUST bring: 1. Medicaid Card 2. Social Security Card 3. Picture ID 4. Proof of Pregnancy 5. Verification of current address if the address on Medicaid card is incorrect "postmarked mail" If not receiving Medicaid, you MUST bring: 1. Social Security Card 2. Picture ID 3. Birth Certificate (if available) Passport or *Green Card 4. Proof of Pregnancy 5. Verification of current address "postmarked mail" for each income presented. 6. Verification of insurance coverage, if any 7. Check stubs from each employer for the previous month (if unable to present check stub  for each week, we will accept check stub for the first and last week ill the same month.) If you can't locate check stubs, you must bring a letter from the employer(s) and it must have the following information on letterhead, typed, in English: o name of company o company telephone number o how long been with the company, if less than one month o how much person earns per hour o how many hours per week work o the gross pay the person earned for the previous month If you are 32 years old or less, you do not have to bring proof of income unless you work or live with the father of the baby and at that time we will need proof of income from you and/or the father of the baby. Green Card recipients are eligible for Medicaid for Pregnant Women (MPW)    

## 2011-09-17 NOTE — MAU Note (Signed)
Pt needed to use restroom. Will obtain spec then speak with pt

## 2011-09-17 NOTE — MAU Note (Signed)
Had pos test on Thurs.  Burning with urination, abd pain, cramping like period was going to start. Frequency some urgency with urination. Hx of irreg cycles.

## 2011-09-17 NOTE — MAU Provider Note (Signed)
Agree with above note.  [name] 09/17/2011 10:21 AM

## 2011-09-18 LAB — URINE CULTURE: Culture  Setup Time: 201306101805

## 2011-09-19 ENCOUNTER — Other Ambulatory Visit (HOSPITAL_COMMUNITY): Payer: Self-pay | Admitting: Nurse Practitioner

## 2011-09-19 MED ORDER — PROMETHAZINE HCL 25 MG RE SUPP: 25.0000 mg | Freq: Four times a day (QID) | RECTAL | Status: DC | PRN

## 2011-09-19 MED ORDER — ONDANSETRON 8 MG PO TBDP: 8.0000 mg | ORAL_TABLET | Freq: Three times a day (TID) | ORAL | Status: AC | PRN

## 2011-09-21 LAB — OB RESULTS CONSOLE ABO/RH: RH Type: NEGATIVE

## 2011-09-21 LAB — OB RESULTS CONSOLE HEPATITIS B SURFACE ANTIGEN: Hepatitis B Surface Ag: NEGATIVE

## 2011-10-02 ENCOUNTER — Other Ambulatory Visit: Payer: Self-pay | Admitting: Adult Health

## 2011-10-02 ENCOUNTER — Other Ambulatory Visit (HOSPITAL_COMMUNITY)
Admission: RE | Admit: 2011-10-02 | Discharge: 2011-10-02 | Disposition: A | Discharge status: Home / Self Care | Payer: Medicaid Other | Source: Ambulatory Visit | Attending: Obstetrics and Gynecology | Admitting: Obstetrics and Gynecology

## 2011-10-02 DIAGNOSIS — Z01419 Encounter for gynecological examination (general) (routine) without abnormal findings: Secondary | ICD-10-CM | POA: Insufficient documentation

## 2011-10-02 DIAGNOSIS — Z113 Encounter for screening for infections with a predominantly sexual mode of transmission: Secondary | ICD-10-CM | POA: Insufficient documentation

## 2011-10-02 DIAGNOSIS — Z1159 Encounter for screening for other viral diseases: Secondary | ICD-10-CM | POA: Insufficient documentation

## 2011-11-11 ENCOUNTER — Inpatient Hospital Stay (HOSPITAL_COMMUNITY)
Admission: AD | Admit: 2011-11-11 | Discharge: 2011-11-11 | Disposition: A | Discharge status: Home / Self Care | Payer: Medicaid Other | Source: Ambulatory Visit | Attending: Obstetrics & Gynecology | Admitting: Obstetrics & Gynecology

## 2011-11-11 ENCOUNTER — Encounter (HOSPITAL_COMMUNITY): Payer: Self-pay | Admitting: *Deleted

## 2011-11-11 DIAGNOSIS — J329 Chronic sinusitis, unspecified: Secondary | ICD-10-CM | POA: Insufficient documentation

## 2011-11-11 DIAGNOSIS — J019 Acute sinusitis, unspecified: Secondary | ICD-10-CM

## 2011-11-11 DIAGNOSIS — O99891 Other specified diseases and conditions complicating pregnancy: Secondary | ICD-10-CM | POA: Insufficient documentation

## 2011-11-11 DIAGNOSIS — O210 Mild hyperemesis gravidarum: Secondary | ICD-10-CM | POA: Insufficient documentation

## 2011-11-11 DIAGNOSIS — H6690 Otitis media, unspecified, unspecified ear: Secondary | ICD-10-CM | POA: Insufficient documentation

## 2011-11-11 DIAGNOSIS — O219 Vomiting of pregnancy, unspecified: Secondary | ICD-10-CM

## 2011-11-11 MED ORDER — PROMETHAZINE HCL 25 MG/ML IJ SOLN
25.0000 mg | Freq: Once | INTRAMUSCULAR | Status: AC
  Administered 2011-11-11: 25 mg via INTRAMUSCULAR
  Filled 2011-11-11: qty 1

## 2011-11-11 MED ORDER — AZITHROMYCIN 200 MG/5ML PO SUSR: ORAL | Status: DC

## 2011-11-11 NOTE — MAU Note (Signed)
Pt C/O ears hurting & facial pressure x 1-2 weeks.  Severe  HA yesterday, not as bad today.  Chest heaviness also started yesterday.  Productive cough, clear sputum.

## 2011-11-11 NOTE — MAU Provider Note (Signed)
[name] [id number] @[redacted]w[redacted]d  by LMP Chief Complaint  Patient presents with  . URI   First Provider Initiated Contact with Patient 11/11/11 1947    SUBJECTIVE  HPI: The patient is a 31 year old gravida 6 para 4014 at 13 weeks 5 days gestation who presents to maternity admissions C/O ear pain & facial pressure x 1-2 weeks. Severe HA yesterday, not as bad today. Productive cough, clear sputum started today. Denies fever, chills, shortness of breath, ear drainage, vaginal bleeding or vaginal discharge. Has not experienced quickening yet. Morning sickness continues with vomiting approximately 3 times per day adequate controlled with Phenergan. Has tried multiple over-the-counter cold medications with minimal relief of symptoms.  Past Medical History  Diagnosis Date  . Complication of anesthesia     Epidural 1 sided  . Headache   . Depression     During 1 pregnancy  . Irregular periods   . Recurrent upper respiratory infection (URI)     Mostly when pregnant   Past Surgical History  Procedure Date  . Wisdom tooth extraction    History   Social History  . Marital Status: Married    Spouse Name: N/A    Number of Children: N/A  . Years of Education: N/A   Occupational History  . Not on file.   Social History Main Topics  . Smoking status: Former Smoker    Types: Cigarettes  . Smokeless tobacco: Former Neurosurgeon    Quit date: 09/17/1998  . Alcohol Use: No  . Drug Use: No  . Sexually Active: Yes    Birth Control/ Protection: None   Other Topics Concern  . Not on file   Social History Narrative  . No narrative on file   No current facility-administered medications on file prior to encounter.   Current Outpatient Prescriptions on File Prior to Encounter  Medication Sig Dispense Refill  . acetaminophen (TYLENOL) 160 MG/5ML suspension Take 325 mg by mouth every 4 (four) hours as needed. For pain/headache      . promethazine (PHENERGAN) 25 MG suppository Place 1 suppository  (25 mg total) rectally every 6 (six) hours as needed for nausea. Sedation precautions  12 each  0   No Known Allergies  ROS: Pertinent items in HPI  OBJECTIVE Blood pressure 130/76, pulse 32, temperature 99 F (37.2 C), temperature source Oral, resp. rate 18, last menstrual period 07/19/2011, SpO2 100.00%.  GENERAL: Well-developed, well-nourished female in no acute distress.  HEENT: Normocephalic, poor dentition, congested. Positive for rhinorrhea, edematous nasal mucous membranes. Throat without erythema, exudate or lesions. Left ear: Positive for effusion negative for erythema or drainage. Bony landmarks not visualized due to the effusion. Right ear positive for effusion and erythema. Negative for drainage. Bony landmarks not visualized due to the effusion. HEART: normal rate RESP: normal effort, lungs clear to auscultation bilaterally, no egophony. ABDOMEN: Soft, nontender EXTREMITIES: Nontender, no edema NEURO: Alert and oriented SPECULUM EXAM: Deferred FHR: 153  LAB RESULTS N/A  IMAGING N/A  ASSESSMENT 1. Acute sinus infection   2. Otitis media, acute   3. Nausea and vomiting of pregnancy, antepartum    PLAN Discharge home Increase fluids and rest Medication List  As of 11/11/2011  7:31 PM   ASK your doctor about these medications         acetaminophen 160 MG/5ML suspension   Commonly known as: TYLENOL   Take 325 mg by mouth every 4 (four) hours as needed. For pain/headache      promethazine 25 MG suppository  Commonly known as: PHENERGAN   Place 1 suppository (25 mg total) rectally every 6 (six) hours as needed for nausea. Sedation precautions      promethazine 25 MG tablet   Commonly known as: PHENERGAN   Take 25 mg by mouth every 6 (six) hours as needed. For nausea            start prenatal care Followup with primary care provider as needed if symptoms do not improve over the next 2-3 days.  Dorathy Kinsman 11/11/2011 7:31 PM

## 2011-11-11 NOTE — MAU Note (Signed)
Pt dx'd with UTI in MAU in June, states she completed an antibiotic.

## 2011-11-15 NOTE — MAU Provider Note (Signed)
Attestation of Attending Supervision of Advanced Practitioner (CNM/NP): Evaluation and management procedures were performed by the Advanced Practitioner under my supervision and collaboration.  I have reviewed the Advanced Practitioner's note and chart, and I agree with the management and plan.  HARRAWAY-SMITH, Savion Washam 4:02 PM     

## 2012-02-08 ENCOUNTER — Inpatient Hospital Stay (HOSPITAL_COMMUNITY)
Admission: AD | Admit: 2012-02-08 | Discharge: 2012-02-08 | Disposition: A | Discharge status: Home / Self Care | Payer: Medicaid Other | Source: Ambulatory Visit | Attending: Obstetrics & Gynecology | Admitting: Obstetrics & Gynecology

## 2012-02-08 ENCOUNTER — Encounter (HOSPITAL_COMMUNITY): Payer: Self-pay | Admitting: *Deleted

## 2012-02-08 DIAGNOSIS — R51 Headache: Secondary | ICD-10-CM | POA: Insufficient documentation

## 2012-02-08 DIAGNOSIS — M549 Dorsalgia, unspecified: Secondary | ICD-10-CM | POA: Insufficient documentation

## 2012-02-08 DIAGNOSIS — R109 Unspecified abdominal pain: Secondary | ICD-10-CM | POA: Insufficient documentation

## 2012-02-08 DIAGNOSIS — O99891 Other specified diseases and conditions complicating pregnancy: Secondary | ICD-10-CM | POA: Insufficient documentation

## 2012-02-08 LAB — URINALYSIS, ROUTINE W REFLEX MICROSCOPIC
Bilirubin Urine: NEGATIVE
Ketones, ur: NEGATIVE mg/dL
Nitrite: NEGATIVE
pH: 7.5 (ref 5.0–8.0)

## 2012-02-08 LAB — CBC
HCT: 30 % — ABNORMAL LOW (ref 36.0–46.0)
Hemoglobin: 10.1 g/dL — ABNORMAL LOW (ref 12.0–15.0)
MCH: 28.5 pg (ref 26.0–34.0)
MCHC: 33.7 g/dL (ref 30.0–36.0)
MCV: 84.5 fL (ref 78.0–100.0)

## 2012-02-08 LAB — COMPREHENSIVE METABOLIC PANEL
BUN: 5 mg/dL — ABNORMAL LOW (ref 6–23)
Calcium: 9.4 mg/dL (ref 8.4–10.5)
Creatinine, Ser: 0.4 mg/dL — ABNORMAL LOW (ref 0.50–1.10)
GFR calc Af Amer: >90 mL/min (ref >90)
GFR calc non Af Amer: >90 mL/min (ref >90)
Glucose, Bld: 91 mg/dL (ref 70–99)
Sodium: 131 mEq/L — ABNORMAL LOW (ref 135–145)
Total Protein: 6.5 g/dL (ref 6.0–8.3)

## 2012-02-08 LAB — URINE MICROSCOPIC-ADD ON

## 2012-02-08 MED ORDER — IBUPROFEN 600 MG PO TABS
600.0000 mg | ORAL_TABLET | Freq: Once | ORAL | Status: AC
  Administered 2012-02-08: 600 mg via ORAL
  Filled 2012-02-08: qty 1

## 2012-02-08 MED ORDER — LACTATED RINGERS IV BOLUS (SEPSIS)
1000.0000 mL | Freq: Once | INTRAVENOUS | Status: AC
  Administered 2012-02-08: 1000 mL via INTRAVENOUS

## 2012-02-08 MED ORDER — GI COCKTAIL ~~LOC~~
30.0000 mL | Freq: Three times a day (TID) | ORAL | Status: DC | PRN
  Administered 2012-02-08: 30 mL via ORAL
  Filled 2012-02-08: qty 30

## 2012-02-08 NOTE — MAU Note (Signed)
Haven't felt well all day. Lower back pain and pain in hips. Stopped awhile and then came back. Feeling some pressure in lower abd

## 2012-02-08 NOTE — MAU Provider Note (Signed)
Chief Complaint:  Back Pain, Abdominal Pain and Headache  First Provider Initiated Contact with Patient 02/08/12 2052     HPI: [name] is a 31 y.o. [id number] at [redacted]w[redacted]d who presents to maternity admissions reporting: 1. Not feeling well all day.  2. Intermittent low back and hip pain. Possibly contracting 10 times per day. 3. Pressure in lower abd. 4. Tightness in mid upper chest 5. Frontal headache and sinus pressure x1 week. Different than her usual headaches.  6. More shortness of breath at this point in the pregnancy compared to previous pregnancies.  Upon further questioning patient also reported increased shortness of breath this pregnancy, some episodes of racing heart rate in history of abnormal EKG several years ago. Patient states she does not know what the abnormality was and did not follow up. Denies vision changes, epigastric pain, urinary complaints, GI complaints, fever, chills, cough, wheezing, leakage of fluid or vaginal bleeding. No shortness of breath while in maternity admissions. Good fetal movement.   Past Medical History: Past Medical History  Diagnosis Date  . Complication of anesthesia     Epidural 1 sided  . Headache   . Depression     During 1 pregnancy  . Irregular periods   . Recurrent upper respiratory infection (URI)     Mostly when pregnant    Past obstetric history: OB History    Grav Para Term Preterm Abortions TAB SAB Ect Mult Living   6 4 4  0 1  1   4      # Outc Date GA Lbr Len/2nd Wgt Sex Del Anes PTL Lv   1 SAB            2 TRM            3 TRM            4 TRM            5 TRM            6 CUR               Past Surgical History: Past Surgical History  Procedure Date  . Wisdom tooth extraction   . Multiple tooth extractions     Family History: Family History  Problem Relation Age of Onset  . Heart disease Maternal Grandmother   . Cancer Maternal Grandmother   . Cancer Maternal Grandfather   . Cancer Paternal Grandmother    . Cancer Paternal Grandfather     Social History: History  Substance Use Topics  . Smoking status: Former Smoker    Types: Cigarettes  . Smokeless tobacco: Former Neurosurgeon    Quit date: 09/17/1998  . Alcohol Use: No    Allergies: No Known Allergies  Meds:  Prescriptions prior to admission  Medication Sig Dispense Refill  . promethazine (PHENERGAN) 25 MG tablet Take 25 mg by mouth every 6 (six) hours as needed. For nausea        ROS: Pertinent findings in history of present illness.  Physical Exam  Blood pressure 128/93, pulse 116, temperature 99.1 F (37.3 C), temperature source Oral, resp. rate 18, height 5' 5.5" (1.664 m), weight 84.369 kg (186 lb), last menstrual period 07/19/2011. Patient Vitals for the past 24 hrs:  BP Temp Temp src Pulse Resp Height Weight  02/08/12 2102 130/85 mmHg - - 109  - - -  02/08/12 2045 118/69 mmHg 98.8 F (37.1 C) - 108  - - -  02/08/12 2030 123/72 mmHg - -  109  - - -  02/08/12 2014 128/93 mmHg - - 116  - - -  02/08/12 2009 116/99 mmHg 99.1 F (37.3 C) Oral 115  18  - -  02/08/12 1934 135/87 mmHg 98.6 F (37 C) Oral 119  20  5' 5.5" (1.664 m) 84.369 kg (186 lb)    GENERAL: Well-developed, well-nourished female in no acute distress.  HEENT: normocephalic HEART: Tachycardia, normal rhythm RESP: normal effort, congested ABDOMEN: Soft, non-tender, gravid appropriate for gestational age EXTREMITIES: Nontender, no edema NEURO: alert and oriented SPECULUM EXAM: NEFG, physiologic discharge, no blood, cervix clean Dilation: Fingertip. 1 cm ext os, closed internal os Effacement (%): Thick Cervical Position: Middle Presentation: Undeterminable Exam by:: Dorathy Kinsman, CNM  FHT:  Baseline 140 , moderate variability, accelerations present, no decelerations Contractions: none   Labs: Results for orders placed during the hospital encounter of 02/08/12 (from the past 24 hour(s))  URINALYSIS, ROUTINE W REFLEX MICROSCOPIC     Status: Abnormal     Collection Time   02/08/12  7:46 PM      Component Value Range   Color, Urine YELLOW  YELLOW   APPearance CLEAR  CLEAR   Specific Gravity, Urine 1.010  1.005 - 1.030   pH 7.5  5.0 - 8.0   Glucose, UA NEGATIVE  NEGATIVE mg/dL   Hgb urine dipstick NEGATIVE  NEGATIVE   Bilirubin Urine NEGATIVE  NEGATIVE   Ketones, ur NEGATIVE  NEGATIVE mg/dL   Protein, ur NEGATIVE  NEGATIVE mg/dL   Urobilinogen, UA 0.2  0.0 - 1.0 mg/dL   Nitrite NEGATIVE  NEGATIVE   Leukocytes, UA SMALL (*) NEGATIVE  URINE MICROSCOPIC-ADD ON     Status: Abnormal   Collection Time   02/08/12  7:46 PM      Component Value Range   Squamous Epithelial / LPF FEW (*) RARE   WBC, UA 3-6  <3 WBC/hpf   RBC / HPF 0-2  <3 RBC/hpf   Bacteria, UA FEW (*) RARE  FETAL FIBRONECTIN     Status: Normal   Collection Time   02/08/12  9:00 PM      Component Value Range   Fetal Fibronectin NEGATIVE  NEGATIVE   Imaging:  NA  MAU Course: Consulted w/ Dr. Despina Hidden. Does not recommend EKG. Recommends GI cocktail for chest tightness and IV fluids for tachycardia. Will give Fioricet for HA and draw PIH labs.  Care of patient turned over to Philipp Deputy, CNM at 2130.  [place], CNM 02/08/2012 10:01 PM  Assessment: No diagnosis found.  Plan: Discharge home Preterm labor precautions and fetal kick counts    Medication List     As of 02/08/2012  8:17 PM    ASK your doctor about these medications         promethazine 25 MG tablet   Commonly known as: PHENERGAN   Take 25 mg by mouth every 6 (six) hours as needed. For nausea        Pt received mod relief of H/A from Fiorcet; feels better and is ready to go home. EFM: no ctx, FHR reactive and reassuring  Labs: Hgb 10.1, WBC 11.2, plt 250,  nl LE  Urine to cult per reflex  A/P: IUP @ 26.3wks Sinus H/A  D/C home with comfort meas rev'd Rev'd OTC sinus meds, Tylenol, Ibuprofen (until 28wks)  F/U as sched at [facility] or sooner prn  [name]  CNM 02/08/2012 11:22pm

## 2012-02-10 LAB — URINE CULTURE

## 2012-02-12 ENCOUNTER — Encounter (HOSPITAL_COMMUNITY): Payer: Self-pay | Admitting: Obstetrics and Gynecology

## 2012-02-12 ENCOUNTER — Inpatient Hospital Stay (HOSPITAL_COMMUNITY)
Admission: AD | Admit: 2012-02-12 | Discharge: 2012-02-12 | Disposition: A | Discharge status: Home / Self Care | Payer: Medicaid Other | Source: Ambulatory Visit | Attending: Family Medicine | Admitting: Family Medicine

## 2012-02-12 DIAGNOSIS — E860 Dehydration: Secondary | ICD-10-CM | POA: Insufficient documentation

## 2012-02-12 DIAGNOSIS — Z3490 Encounter for supervision of normal pregnancy, unspecified, unspecified trimester: Secondary | ICD-10-CM

## 2012-02-12 DIAGNOSIS — R0609 Other forms of dyspnea: Secondary | ICD-10-CM | POA: Insufficient documentation

## 2012-02-12 DIAGNOSIS — O26899 Other specified pregnancy related conditions, unspecified trimester: Secondary | ICD-10-CM | POA: Diagnosis present

## 2012-02-12 DIAGNOSIS — O99891 Other specified diseases and conditions complicating pregnancy: Secondary | ICD-10-CM | POA: Insufficient documentation

## 2012-02-12 DIAGNOSIS — R0989 Other specified symptoms and signs involving the circulatory and respiratory systems: Secondary | ICD-10-CM | POA: Insufficient documentation

## 2012-02-12 DIAGNOSIS — R0602 Shortness of breath: Secondary | ICD-10-CM | POA: Insufficient documentation

## 2012-02-12 LAB — URINALYSIS, ROUTINE W REFLEX MICROSCOPIC
Ketones, ur: NEGATIVE mg/dL
Nitrite: NEGATIVE
Specific Gravity, Urine: 1.015 (ref 1.005–1.030)
Urobilinogen, UA: 0.2 mg/dL (ref 0.0–1.0)
pH: 6 (ref 5.0–8.0)

## 2012-02-12 LAB — URINE MICROSCOPIC-ADD ON

## 2012-02-12 MED ORDER — LACTATED RINGERS IV BOLUS (SEPSIS)
1000.0000 mL | Freq: Once | INTRAVENOUS | Status: AC
  Administered 2012-02-12: 1000 mL via INTRAVENOUS

## 2012-02-12 NOTE — MAU Provider Note (Signed)
History     CSN: [id number]  Arrival date and time: 02/12/12 1248   First Provider Initiated Contact with Patient 02/12/12 1348      Chief Complaint  Patient presents with  . Shortness of Breath   HPI Patient presented to the [facility] for shortness of breath and chest pressure for "a couple weeks." She admits she experienced similar symptoms during her last pregnancy. She states, at that time she had an abnormal EKG, which she did not follow up. She does not remember why her EKG was said to be abnormal. She had a childhood history of asthma, but has not needed medications since. She drinks ice tea and little water. She has had some sinus congestion for a few weeks as well. She has experienced depression with her past pregnancy. She denies complications with her pregnancy and has not experienced leaking, gush of fluid or vaginal bleeding. She has felt her baby move.   Past Medical History  Diagnosis Date  . Complication of anesthesia     Epidural 1 sided  . Headache   . Depression     During 1 pregnancy  . Irregular periods   . Recurrent upper respiratory infection (URI)     Mostly when pregnant    Past Surgical History  Procedure Date  . Wisdom tooth extraction   . Multiple tooth extractions     Family History  Problem Relation Age of Onset  . Heart disease Maternal Grandmother   . Cancer Maternal Grandmother   . Cancer Maternal Grandfather   . Cancer Paternal Grandmother   . Cancer Paternal Grandfather     History  Substance Use Topics  . Smoking status: Former Smoker    Types: Cigarettes  . Smokeless tobacco: Former Neurosurgeon    Quit date: 09/17/1998  . Alcohol Use: No    Allergies: No Known Allergies  No prescriptions prior to admission    Review of Systems  Constitutional: Negative for fever and chills.  HENT: Positive for congestion. Negative for sore throat.   Eyes: Negative for blurred vision and double vision.  Respiratory: Positive for shortness of  breath. Negative for cough, hemoptysis, sputum production and wheezing.   Cardiovascular: Negative for chest pain, palpitations, orthopnea and leg swelling.       "Chest Pressure"  Gastrointestinal: Negative for heartburn, nausea, vomiting, abdominal pain and diarrhea.  Genitourinary: Negative for dysuria, urgency, frequency and hematuria.  Musculoskeletal: Negative.        Leg cramps in her sleep  Skin: Negative for rash.  Neurological: Positive for dizziness. Negative for weakness and headaches.       Dizziness upon standing  All other systems reviewed and are negative.   Physical Exam   Blood pressure 126/82, pulse 115, temperature 98.5 F (36.9 C), temperature source Oral, resp. rate 18, last menstrual period 07/19/2011. Patient Vitals for the past 24 hrs:  BP Temp Temp src Pulse Resp  02/12/12 1558 122/85 mmHg - - 100  18   02/12/12 1319 126/82 mmHg 98.5 F (36.9 C) Oral 115  18    Pulse oximetry on room air is 99 - 100%.  Physical Exam  Nursing note and vitals reviewed. Constitutional: She is oriented to person, place, and time. She appears well-developed and well-nourished. No distress.  Eyes: Conjunctivae normal and EOM are normal. Right eye exhibits no discharge. No scleral icterus.  Neck: Neck supple.  Cardiovascular: Normal rate, regular rhythm, normal heart sounds and intact distal pulses.  Exam reveals no gallop and  no friction rub.   No murmur heard. Respiratory: Effort normal and breath sounds normal. No respiratory distress. She has no wheezes. She has no rales. She exhibits no tenderness.  GI: Soft. Bowel sounds are normal. She exhibits no distension. There is no tenderness. There is no rebound and no guarding.       Gravid   Musculoskeletal: She exhibits no edema and no tenderness.  Lymphadenopathy:    She has no cervical adenopathy.  Neurological: She is alert and oriented to person, place, and time.  Skin: Skin is warm and dry. No rash noted.  Psychiatric:  She has a normal mood and affect.   EFM: no contractions. Moderate variability.  Results for orders placed during the hospital encounter of 02/12/12 (from the past 24 hour(s))  URINALYSIS, ROUTINE W REFLEX MICROSCOPIC     Status: Abnormal   Collection Time   02/12/12  1:54 PM      Component Value Range   Color, Urine YELLOW  YELLOW   APPearance CLEAR  CLEAR   Specific Gravity, Urine 1.015  1.005 - 1.030   pH 6.0  5.0 - 8.0   Glucose, UA NEGATIVE  NEGATIVE mg/dL   Hgb urine dipstick NEGATIVE  NEGATIVE   Bilirubin Urine NEGATIVE  NEGATIVE   Ketones, ur NEGATIVE  NEGATIVE mg/dL   Protein, ur NEGATIVE  NEGATIVE mg/dL   Urobilinogen, UA 0.2  0.0 - 1.0 mg/dL   Nitrite NEGATIVE  NEGATIVE   Leukocytes, UA TRACE (*) NEGATIVE  URINE MICROSCOPIC-ADD ON     Status: Abnormal   Collection Time   02/12/12  1:54 PM      Component Value Range   Squamous Epithelial / LPF FEW (*) RARE   WBC, UA 0-2  <3 WBC/hpf   Bacteria, UA RARE  RARE   MAU Course  Procedures 1. IV fluid bolus 2. UA 3. EKG Assessment and Plan  1. Dyspnea on exertion: outpatient cardiac consult with maternal Echo recommended 2. Dehydration with pregnancy: Encourage PO fluids 8-10 glasses a day 3. TSH: pending 4. Discharge home with follow up in clinic within a week    [name], [name] 02/12/2012, 2:09 PM  I was present for the exam and agree with above with addition of pt report that while she had mild tachycardia and SOB w/ prior pregnancies, she states that it is worse with this pregnancy. Sometimes she feels very dizzy as if she may pass out when she has a racing heartbeat. Sx are worse w/ exertion or anxiety. States she has not been significantly symptomatic while in MAU. Encouraged to Notify MDs at [facility] of how these Sx vary from previous pregnancies. May need cardiology referral. F/U tomorrow (has 1 hour GTT).   [place], CNM 02/12/2012 7:15 PM

## 2012-02-12 NOTE — MAU Note (Signed)
Pt presents to MAU with chief complain of shortness of breath. Pt is [redacted]w[redacted]d- with hx of asthma as a child. Pt says she had something similar to this with last pregnancy. Pt says the SOB started over the weekend, when she is up moving around the SOB worsens.

## 2012-02-13 NOTE — MAU Provider Note (Signed)
Chart reviewed and agree with management and plan.  

## 2012-03-05 ENCOUNTER — Encounter: Payer: Self-pay | Admitting: *Deleted

## 2012-03-05 ENCOUNTER — Encounter (HOSPITAL_COMMUNITY): Payer: Self-pay

## 2012-03-05 ENCOUNTER — Encounter: Payer: Medicaid Other | Attending: Obstetrics and Gynecology | Admitting: *Deleted

## 2012-03-05 ENCOUNTER — Other Ambulatory Visit: Payer: Self-pay

## 2012-03-05 ENCOUNTER — Inpatient Hospital Stay (HOSPITAL_COMMUNITY)
Admission: AD | Admit: 2012-03-05 | Discharge: 2012-03-05 | Disposition: A | Discharge status: Home / Self Care | Payer: Medicaid Other | Source: Ambulatory Visit | Attending: Family Medicine | Admitting: Family Medicine

## 2012-03-05 DIAGNOSIS — Z713 Dietary counseling and surveillance: Secondary | ICD-10-CM | POA: Insufficient documentation

## 2012-03-05 DIAGNOSIS — R000 Tachycardia, unspecified: Secondary | ICD-10-CM

## 2012-03-05 DIAGNOSIS — R109 Unspecified abdominal pain: Secondary | ICD-10-CM | POA: Insufficient documentation

## 2012-03-05 DIAGNOSIS — O99891 Other specified diseases and conditions complicating pregnancy: Secondary | ICD-10-CM | POA: Insufficient documentation

## 2012-03-05 DIAGNOSIS — R42 Dizziness and giddiness: Secondary | ICD-10-CM | POA: Insufficient documentation

## 2012-03-05 DIAGNOSIS — O99810 Abnormal glucose complicating pregnancy: Secondary | ICD-10-CM | POA: Insufficient documentation

## 2012-03-05 DIAGNOSIS — F419 Anxiety disorder, unspecified: Secondary | ICD-10-CM

## 2012-03-05 HISTORY — DX: Gestational diabetes mellitus in pregnancy, unspecified control: O24.419

## 2012-03-05 LAB — COMPREHENSIVE METABOLIC PANEL
ALT: 15 U/L (ref 0–35)
AST: 14 U/L (ref 0–37)
Albumin: 2.4 g/dL — ABNORMAL LOW (ref 3.5–5.2)
Alkaline Phosphatase: 66 U/L (ref 39–117)
CO2: 24 mEq/L (ref 19–32)
Chloride: 99 mEq/L (ref 96–112)
Creatinine, Ser: 0.44 mg/dL — ABNORMAL LOW (ref 0.50–1.10)
GFR calc non Af Amer: >90 mL/min (ref >90)
Potassium: 3.7 mEq/L (ref 3.5–5.1)
Sodium: 132 mEq/L — ABNORMAL LOW (ref 135–145)
Total Bilirubin: 0.2 mg/dL — ABNORMAL LOW (ref 0.3–1.2)

## 2012-03-05 LAB — URINALYSIS, ROUTINE W REFLEX MICROSCOPIC
Bilirubin Urine: NEGATIVE
Glucose, UA: 100 mg/dL — AB
Hgb urine dipstick: NEGATIVE
Specific Gravity, Urine: <1.005 — ABNORMAL LOW (ref 1.005–1.030)
Urobilinogen, UA: 0.2 mg/dL (ref 0.0–1.0)

## 2012-03-05 LAB — URINE MICROSCOPIC-ADD ON

## 2012-03-05 LAB — CBC
MCV: 81 fL (ref 78.0–100.0)
Platelets: 248 10*3/uL (ref 150–400)
RBC: 3.63 MIL/uL — ABNORMAL LOW (ref 3.87–5.11)
RDW: 14.1 % (ref 11.5–15.5)
WBC: 11.8 10*3/uL — ABNORMAL HIGH (ref 4.0–10.5)

## 2012-03-05 LAB — GLUCOSE, CAPILLARY: Glucose-Capillary: 97 mg/dL (ref 70–99)

## 2012-03-05 NOTE — MAU Provider Note (Signed)
History     CSN: [id number]  Arrival date and time: 03/05/12 1513   First Provider Initiated Contact with Patient 03/05/12 1633      Chief Complaint  Patient presents with  . Dizziness  . Headache  . Tachycardia   HPI Ms. Branch is 31yo, [id number], GA: [redacted]w[redacted]d who presents with tachycardia and dizziness. Pt states palpitation today and felt she might pass out. She also reports an abdominal pain and lower abdominal pain. No vaginal discharge, good fetal movement.  Pt was seen in MAU on 11/15 for the same problem. She reports stress at home lately with her kids.     Past Medical History  Diagnosis Date  . Complication of anesthesia     Epidural 1 sided  . Headache   . Depression     During 1 pregnancy  . Irregular periods   . Recurrent upper respiratory infection (URI)     Mostly when pregnant  . Diabetes mellitus without complication   . Gestational diabetes     just started checking BS 03/05/12    Past Surgical History  Procedure Date  . Wisdom tooth extraction   . Multiple tooth extractions     Family History  Problem Relation Age of Onset  . Heart disease Maternal Grandmother   . Cancer Maternal Grandmother   . Cancer Maternal Grandfather   . Cancer Paternal Grandmother   . Cancer Paternal Grandfather     History  Substance Use Topics  . Smoking status: Former Smoker    Types: Cigarettes  . Smokeless tobacco: Former Neurosurgeon    Quit date: 09/17/1998  . Alcohol Use: No    Allergies: No Known Allergies  Prescriptions prior to admission  Medication Sig Dispense Refill  . glucose blood test strip 1 each by Other route as needed. Use as instructed      . glucose monitoring kit (FREESTYLE) monitoring kit 1 each by Does not apply route as needed.        Review of Systems  Constitutional: Negative for fever.  Cardiovascular: Positive for chest pain and palpitations.  Gastrointestinal: Positive for nausea and abdominal pain. Negative for vomiting.    Physical Exam   Blood pressure 126/84, pulse 108, temperature 98.5 F (36.9 C), temperature source Oral, resp. rate 18, height 5\' 6"  (1.676 m), weight 86.274 kg (190 lb 3.2 oz), last menstrual period 07/19/2011, SpO2 99.00%.  Physical Exam  Constitutional: She appears well-developed.  HENT:  Head: Normocephalic.  Neck: Neck supple.  Cardiovascular: Regular rhythm.  Tachycardia present.   GI: Soft. Bowel sounds are normal.  Musculoskeletal: She exhibits no edema.  Skin: Skin is warm.  FHT: 130s, mod variability, no decels, accels present Contraction: None Category I   MAU Course  Procedures  MDM UA EKG CBC  CMP  Assessment and Plan  This  is 31yo, [id number], GA: [redacted]w[redacted]d who presents with tachycardia and dizziness.  Differential: dehydration, thyroid dysfunction, anemia, anxiety. Her tachycardia is likely due anxiety, her TSH was normal, her anemia is mild. Pt's CBC shows Hg 9.5. EKG wnl Will start Vistaril 50mg  TID PRN Advised to follow up with her pre natal provider.  Governor Specking 03/05/2012, 4:39 PM   I have seen this patient and agree with the above resident's note with the following additions.  Reviewed assessment and findings with Dr Emelda Fear.  Pt unable to swallow pills so will take liquid Benadryl in place of Vistaril, continue chewable vitamins, increase iron-rich foods, and f/u with [facility].  LEFTWICH-KIRBY, Shanitha Twining Certified Nurse-Midwife

## 2012-03-05 NOTE — Progress Notes (Signed)
  Patient was seen on 03/05/2012 for Gestational Diabetes self-management class at the [facility]. The following learning objectives were met by the patient during this course:   States the definition of Gestational Diabetes  States why dietary management is important in controlling blood glucose  Describes the effects each nutrient has on blood glucose levels  Demonstrates ability to create a balanced meal plan  Demonstrates carbohydrate counting   States when to check blood glucose levels  Demonstrates proper blood glucose monitoring techniques  States the effect of stress and exercise on blood glucose levels  States the importance of limiting caffeine and abstaining from alcohol and smoking  Blood glucose monitor given: Accu Chek Nano BG Monitoring Kit Lot # [id number] Exp: 03/08/2013 Blood glucose reading: 92 mg/dl  Patient instructed to monitor glucose levels: FBS: 60 - <90 2 hour: <120  *Patient received handouts:  Nutrition Diabetes and Pregnancy  Carbohydrate Counting List  Patient will be seen for follow-up as needed.

## 2012-03-05 NOTE — MAU Note (Signed)
Headache all week with some dizziness. Intermittent abdominal tightening with no pain. Urine with strong odor so she thinks might be getting a UTI. Denies vaginal bleeding or leaking of fluid. Felt heart racing today and felt like she might pass out; states has been seen here and at doctors office for fast heart rate.

## 2012-03-05 NOTE — Patient Instructions (Signed)
Goals:  Check glucose levels per MD as instructed  Follow Gestational Diabetes Diet as instructed  Call for follow-up as needed    

## 2012-03-08 NOTE — MAU Provider Note (Signed)
Attestation of Attending Supervision of Advanced Practitioner: Evaluation and management procedures were performed by the PA/NP/CNM/OB Fellow under my supervision/collaboration. Chart reviewed and agree with management and plan.  Tilda Burrow 03/08/2012 6:41 PM

## 2012-04-09 NOTE — L&D Delivery Note (Signed)
Delivery Note At 2:32 PM a viable and healthy female was delivered via Vaginal, Spontaneous Delivery (Presentation: Left Occiput Anterior).  APGAR: 9, 9; weight pending .   Placenta status: Intact, Spontaneous.  Cord: 3 vessels with the following complications: None.  AROM immediately prior to delivery, meconium noted.  Infant vigorous at delivery and placed in mothers arms, bulb suction only.  Cord clamped by CNM and cut by FOB.    Anesthesia: Epidural  Episiotomy: None Lacerations: None Suture Repair: N/A Est. Blood Loss (mL): 350  Mom to postpartum.  Baby rooming in with mother.   LEFTWICH-KIRBY, Caliegh Middlekauff 05/10/2012, 2:53 PM

## 2012-04-21 ENCOUNTER — Inpatient Hospital Stay (HOSPITAL_COMMUNITY)
Admission: AD | Admit: 2012-04-21 | Discharge: 2012-04-21 | Disposition: A | Discharge status: Hospice | Payer: Medicaid Other | Source: Ambulatory Visit | Attending: Obstetrics & Gynecology | Admitting: Obstetrics & Gynecology

## 2012-04-21 ENCOUNTER — Encounter (HOSPITAL_COMMUNITY): Payer: Self-pay | Admitting: *Deleted

## 2012-04-21 DIAGNOSIS — O99891 Other specified diseases and conditions complicating pregnancy: Secondary | ICD-10-CM | POA: Insufficient documentation

## 2012-04-21 DIAGNOSIS — R109 Unspecified abdominal pain: Secondary | ICD-10-CM | POA: Insufficient documentation

## 2012-04-21 LAB — URINALYSIS, ROUTINE W REFLEX MICROSCOPIC
Glucose, UA: NEGATIVE mg/dL
Nitrite: NEGATIVE
Protein, ur: NEGATIVE mg/dL
Urobilinogen, UA: 0.2 mg/dL (ref 0.0–1.0)

## 2012-04-21 LAB — CBC
Hemoglobin: 10.4 g/dL — ABNORMAL LOW (ref 12.0–15.0)
MCHC: 31.3 g/dL (ref 30.0–36.0)
Platelets: 268 10*3/uL (ref 150–400)
RDW: 18.6 % — ABNORMAL HIGH (ref 11.5–15.5)

## 2012-04-21 LAB — COMPREHENSIVE METABOLIC PANEL
ALT: 15 U/L (ref 0–35)
CO2: 20 mEq/L (ref 19–32)
Calcium: 9.4 mg/dL (ref 8.4–10.5)
GFR calc Af Amer: >90 mL/min (ref >90)
GFR calc non Af Amer: >90 mL/min (ref >90)
Glucose, Bld: 97 mg/dL (ref 70–99)
Sodium: 134 mEq/L — ABNORMAL LOW (ref 135–145)

## 2012-04-21 LAB — OB RESULTS CONSOLE GBS: GBS: NEGATIVE

## 2012-04-21 LAB — URINE MICROSCOPIC-ADD ON

## 2012-04-21 MED ORDER — OXYCODONE-ACETAMINOPHEN 5-325 MG/5ML PO SOLN
10.0000 mL | Freq: Once | ORAL | Status: AC
  Administered 2012-04-21: 10 mL via ORAL
  Filled 2012-04-21: qty 10

## 2012-04-21 MED ORDER — OXYCODONE-ACETAMINOPHEN 5-325 MG PO TABS: 2.0000 | ORAL_TABLET | Freq: Once | ORAL | Status: DC

## 2012-04-21 MED ORDER — ONDANSETRON 4 MG PO TBDP
4.0000 mg | ORAL_TABLET | Freq: Once | ORAL | Status: AC
  Administered 2012-04-21: 4 mg via ORAL
  Filled 2012-04-21: qty 1

## 2012-04-21 NOTE — MAU Note (Addendum)
Started around 1230 feeling sick and crampy.  Is nauseated, not actually thrown up.  Cramping in lower abd, was worse earlier.  Denies diarrhea or fever.

## 2012-04-22 LAB — URINE CULTURE: Colony Count: 4000

## 2012-05-02 ENCOUNTER — Encounter: Payer: Self-pay | Admitting: *Deleted

## 2012-05-02 DIAGNOSIS — Z3490 Encounter for supervision of normal pregnancy, unspecified, unspecified trimester: Secondary | ICD-10-CM

## 2012-05-09 ENCOUNTER — Telehealth (HOSPITAL_COMMUNITY): Payer: Self-pay | Admitting: *Deleted

## 2012-05-09 ENCOUNTER — Encounter (HOSPITAL_COMMUNITY): Payer: Self-pay | Admitting: *Deleted

## 2012-05-09 NOTE — Telephone Encounter (Signed)
Preadmission screen  

## 2012-05-10 ENCOUNTER — Inpatient Hospital Stay (HOSPITAL_COMMUNITY): Payer: Medicaid Other | Admitting: Anesthesiology

## 2012-05-10 ENCOUNTER — Inpatient Hospital Stay (HOSPITAL_COMMUNITY)
Admission: RE | Admit: 2012-05-10 | Discharge: 2012-05-11 | DRG: 775 | Disposition: A | Discharge status: Home / Self Care | Payer: Medicaid Other | Source: Ambulatory Visit | Attending: Obstetrics & Gynecology | Admitting: Obstetrics & Gynecology

## 2012-05-10 ENCOUNTER — Encounter (HOSPITAL_COMMUNITY): Payer: Self-pay

## 2012-05-10 ENCOUNTER — Encounter (HOSPITAL_COMMUNITY): Payer: Self-pay | Admitting: Anesthesiology

## 2012-05-10 DIAGNOSIS — O99892 Other specified diseases and conditions complicating childbirth: Secondary | ICD-10-CM | POA: Diagnosis present

## 2012-05-10 DIAGNOSIS — R030 Elevated blood-pressure reading, without diagnosis of hypertension: Secondary | ICD-10-CM | POA: Diagnosis present

## 2012-05-10 DIAGNOSIS — O9989 Other specified diseases and conditions complicating pregnancy, childbirth and the puerperium: Secondary | ICD-10-CM

## 2012-05-10 DIAGNOSIS — O99814 Abnormal glucose complicating childbirth: Principal | ICD-10-CM | POA: Diagnosis present

## 2012-05-10 HISTORY — DX: Anxiety disorder, unspecified: F41.9

## 2012-05-10 LAB — CBC
HCT: 33.1 % — ABNORMAL LOW (ref 36.0–46.0)
Hemoglobin: 10.3 g/dL — ABNORMAL LOW (ref 12.0–15.0)
MCV: 78.2 fL (ref 78.0–100.0)
Platelets: 249 10*3/uL (ref 150–400)
RBC: 4.17 MIL/uL (ref 3.87–5.11)
RDW: 18.6 % — ABNORMAL HIGH (ref 11.5–15.5)
WBC: 9.9 10*3/uL (ref 4.0–10.5)

## 2012-05-10 LAB — RPR: RPR Ser Ql: NONREACTIVE

## 2012-05-10 MED ORDER — EPHEDRINE 5 MG/ML INJ
10.0000 mg | INTRAVENOUS | Status: DC | PRN
  Filled 2012-05-10: qty 2

## 2012-05-10 MED ORDER — PHENYLEPHRINE 40 MCG/ML (10ML) SYRINGE FOR IV PUSH (FOR BLOOD PRESSURE SUPPORT)
80.0000 ug | PREFILLED_SYRINGE | INTRAVENOUS | Status: DC | PRN
  Filled 2012-05-10: qty 2
  Filled 2012-05-10: qty 5

## 2012-05-10 MED ORDER — CITRIC ACID-SODIUM CITRATE 334-500 MG/5ML PO SOLN: 30.0000 mL | ORAL | Status: DC | PRN

## 2012-05-10 MED ORDER — LIDOCAINE HCL (PF) 1 % IJ SOLN
INTRAMUSCULAR | Status: DC | PRN
  Administered 2012-05-10 (×2): 5 mL

## 2012-05-10 MED ORDER — FLEET ENEMA 7-19 GM/118ML RE ENEM: 1.0000 | ENEMA | RECTAL | Status: DC | PRN

## 2012-05-10 MED ORDER — ONDANSETRON HCL 4 MG/2ML IJ SOLN: 4.0000 mg | Freq: Four times a day (QID) | INTRAMUSCULAR | Status: DC | PRN

## 2012-05-10 MED ORDER — DIBUCAINE 1 % RE OINT: 1.0000 "application " | TOPICAL_OINTMENT | RECTAL | Status: DC | PRN

## 2012-05-10 MED ORDER — OXYCODONE-ACETAMINOPHEN 5-325 MG/5ML PO SOLN
5.0000 mg | ORAL | Status: DC | PRN
  Administered 2012-05-11: 5 mg via ORAL
  Filled 2012-05-10: qty 5

## 2012-05-10 MED ORDER — ONDANSETRON HCL 4 MG PO TABS: 4.0000 mg | ORAL_TABLET | ORAL | Status: DC | PRN

## 2012-05-10 MED ORDER — OXYCODONE-ACETAMINOPHEN 5-325 MG PO TABS: 1.0000 | ORAL_TABLET | ORAL | Status: DC | PRN

## 2012-05-10 MED ORDER — TETANUS-DIPHTH-ACELL PERTUSSIS 5-2.5-18.5 LF-MCG/0.5 IM SUSP
0.5000 mL | Freq: Once | INTRAMUSCULAR | Status: AC
  Administered 2012-05-10: 0.5 mL via INTRAMUSCULAR
  Filled 2012-05-10: qty 0.5

## 2012-05-10 MED ORDER — PRENATAL MULTIVITAMIN CH
1.0000 | ORAL_TABLET | Freq: Every day | ORAL | Status: DC
  Filled 2012-05-10: qty 1

## 2012-05-10 MED ORDER — PHENYLEPHRINE 40 MCG/ML (10ML) SYRINGE FOR IV PUSH (FOR BLOOD PRESSURE SUPPORT)
80.0000 ug | PREFILLED_SYRINGE | INTRAVENOUS | Status: DC | PRN
  Filled 2012-05-10: qty 2

## 2012-05-10 MED ORDER — DIPHENHYDRAMINE HCL 50 MG/ML IJ SOLN: 12.5000 mg | INTRAMUSCULAR | Status: DC | PRN

## 2012-05-10 MED ORDER — LANOLIN HYDROUS EX OINT: TOPICAL_OINTMENT | CUTANEOUS | Status: DC | PRN

## 2012-05-10 MED ORDER — IBUPROFEN 100 MG/5ML PO SUSP
600.0000 mg | Freq: Four times a day (QID) | ORAL | Status: DC | PRN
  Administered 2012-05-10 – 2012-05-11 (×5): 600 mg via ORAL
  Filled 2012-05-10 (×6): qty 30

## 2012-05-10 MED ORDER — ONDANSETRON HCL 4 MG/2ML IJ SOLN: 4.0000 mg | INTRAMUSCULAR | Status: DC | PRN

## 2012-05-10 MED ORDER — LIDOCAINE HCL (PF) 1 % IJ SOLN: 30.0000 mL | INTRAMUSCULAR | Status: DC | PRN

## 2012-05-10 MED ORDER — ZOLPIDEM TARTRATE 5 MG PO TABS: 5.0000 mg | ORAL_TABLET | Freq: Every evening | ORAL | Status: DC | PRN

## 2012-05-10 MED ORDER — OXYTOCIN BOLUS FROM INFUSION: 500.0000 mL | INTRAVENOUS | Status: DC

## 2012-05-10 MED ORDER — IBUPROFEN 600 MG PO TABS: 600.0000 mg | ORAL_TABLET | Freq: Four times a day (QID) | ORAL | Status: DC | PRN

## 2012-05-10 MED ORDER — LACTATED RINGERS IV SOLN: 500.0000 mL | INTRAVENOUS | Status: DC | PRN

## 2012-05-10 MED ORDER — LACTATED RINGERS IV SOLN
500.0000 mL | Freq: Once | INTRAVENOUS | Status: AC
  Administered 2012-05-10: 12:00:00 via INTRAVENOUS

## 2012-05-10 MED ORDER — BENZOCAINE-MENTHOL 20-0.5 % EX AERO: 1.0000 "application " | INHALATION_SPRAY | CUTANEOUS | Status: DC | PRN

## 2012-05-10 MED ORDER — ACETAMINOPHEN 325 MG PO TABS: 650.0000 mg | ORAL_TABLET | ORAL | Status: DC | PRN

## 2012-05-10 MED ORDER — FENTANYL 2.5 MCG/ML BUPIVACAINE 1/10 % EPIDURAL INFUSION (WH - ANES)
14.0000 mL/h | INTRAMUSCULAR | Status: DC
  Administered 2012-05-10: 14 mL/h via EPIDURAL
  Filled 2012-05-10: qty 125

## 2012-05-10 MED ORDER — OXYTOCIN 40 UNITS IN LACTATED RINGERS INFUSION - SIMPLE MED
62.5000 mL/h | INTRAVENOUS | Status: DC
Start: 2012-05-10 — End: 2012-05-10
  Administered 2012-05-10: 62.5 mL/h via INTRAVENOUS

## 2012-05-10 MED ORDER — OXYTOCIN 40 UNITS IN LACTATED RINGERS INFUSION - SIMPLE MED
1.0000 m[IU]/min | INTRAVENOUS | Status: DC
  Administered 2012-05-10: 2 m[IU]/min via INTRAVENOUS
  Filled 2012-05-10: qty 1000

## 2012-05-10 MED ORDER — WITCH HAZEL-GLYCERIN EX PADS: 1.0000 "application " | MEDICATED_PAD | CUTANEOUS | Status: DC | PRN

## 2012-05-10 MED ORDER — SIMETHICONE 80 MG PO CHEW: 80.0000 mg | CHEWABLE_TABLET | ORAL | Status: DC | PRN

## 2012-05-10 MED ORDER — SENNOSIDES-DOCUSATE SODIUM 8.6-50 MG PO TABS: 2.0000 | ORAL_TABLET | Freq: Every day | ORAL | Status: DC

## 2012-05-10 MED ORDER — EPHEDRINE 5 MG/ML INJ
10.0000 mg | INTRAVENOUS | Status: DC | PRN
  Filled 2012-05-10: qty 2
  Filled 2012-05-10: qty 4

## 2012-05-10 MED ORDER — LACTATED RINGERS IV SOLN
INTRAVENOUS | Status: DC
  Administered 2012-05-10 (×2): via INTRAVENOUS

## 2012-05-10 MED ORDER — TERBUTALINE SULFATE 1 MG/ML IJ SOLN: 0.2500 mg | Freq: Once | INTRAMUSCULAR | Status: DC | PRN

## 2012-05-10 MED ORDER — DIPHENHYDRAMINE HCL 12.5 MG/5ML PO ELIX: 25.0000 mg | ORAL_SOLUTION | Freq: Four times a day (QID) | ORAL | Status: DC | PRN

## 2012-05-10 NOTE — Progress Notes (Signed)
Pt state she has difficulty swallowing pills-prefers liquids.

## 2012-05-10 NOTE — Anesthesia Preprocedure Evaluation (Signed)
Anesthesia Evaluation  Patient identified by MRN, date of birth, ID band Patient awake    Reviewed: Allergy & Precautions, H&P , Patient's Chart, lab work & pertinent test results  Airway Mallampati: II TM Distance: >3 FB Neck ROM: full    Dental No notable dental hx.    Pulmonary neg pulmonary ROS, Recent URI ,  breath sounds clear to auscultation  Pulmonary exam normal       Cardiovascular hypertension, negative cardio ROS  Rhythm:regular Rate:Normal     Neuro/Psych  Headaches, negative neurological ROS  negative psych ROS   GI/Hepatic negative GI ROS, Neg liver ROS,   Endo/Other  negative endocrine ROSdiabetes  Renal/GU negative Renal ROS     Musculoskeletal   Abdominal   Peds  Hematology negative hematology ROS (+)   Anesthesia Other Findings Complication of anesthesia   Epidural 1 sided Headache        Depression   During 1 pregnancy Irregular periods        Recurrent upper respiratory infection (URI)   Mostly when pregnant Diabetes mellitus without complication        Gestational diabetes   just started checking BS 03/05/12 Pregnancy induced hypertension        Anemia     Anxiety "fast hearbeat" took Benadryl at end of pregnancy    Reproductive/Obstetrics (+) Pregnancy                           Anesthesia Physical Anesthesia Plan  ASA: III  Anesthesia Plan: Epidural   Post-op Pain Management:    Induction:   Airway Management Planned:   Additional Equipment:   Intra-op Plan:   Post-operative Plan:   Informed Consent: I have reviewed the patients History and Physical, chart, labs and discussed the procedure including the risks, benefits and alternatives for the proposed anesthesia with the patient or authorized representative who has indicated his/her understanding and acceptance.     Plan Discussed with:   Anesthesia Plan Comments:         Anesthesia Quick  Evaluation

## 2012-05-10 NOTE — Anesthesia Procedure Notes (Signed)
Epidural Patient location during procedure: OB Start time: 05/10/2012 11:58 AM  Staffing Anesthesiologist: Angus Seller., Harrell Gave. Performed by: anesthesiologist   Preanesthetic Checklist Completed: patient identified, site marked, surgical consent, pre-op evaluation, timeout performed, IV checked, risks and benefits discussed and monitors and equipment checked  Epidural Patient position: sitting Prep: site prepped and draped and DuraPrep Patient monitoring: continuous pulse ox and blood pressure Approach: midline Injection technique: LOR air and LOR saline  Needle:  Needle type: Tuohy  Needle gauge: 17 G Needle length: 9 cm and 9 Needle insertion depth: 5 cm cm Catheter type: closed end flexible Catheter size: 19 Gauge Catheter at skin depth: 10 cm Test dose: negative  Assessment Events: blood not aspirated, injection not painful, no injection resistance, negative IV test and no paresthesia  Additional Notes Patient identified.  Risk benefits discussed including failed block, incomplete pain control, headache, nerve damage, paralysis, blood pressure changes, nausea, vomiting, reactions to medication both toxic or allergic, and postpartum back pain.  Patient expressed understanding and wished to proceed.  All questions were answered.  Sterile technique used throughout procedure and epidural site dressed with sterile barrier dressing. No paresthesia or other complications noted.The patient did not experience any signs of intravascular injection such as tinnitus or metallic taste in mouth nor signs of intrathecal spread such as rapid motor block. Please see nursing notes for vital signs.

## 2012-05-10 NOTE — Progress Notes (Signed)
Pt states her blood sugar was 78 this morning at home.

## 2012-05-10 NOTE — Plan of Care (Signed)
Problem: Consults Goal: Birthing Suites Patient Information Press F2 to bring up selections list  Outcome: Completed/Met Date Met:  05/10/12  Pt 37-[redacted] weeks EGA, Inpatient induction, PIH (Pregnancy induced hypertension) and Diabetic

## 2012-05-10 NOTE — H&P (Signed)
[name] is a 32 y.o. female presenting for IOL for gestational diabetes at 60 weeks with favorable cervix.  GDM is diet controlled.  Pt has had some elevated BPs during late pregnancy, but no proteinuria and BP normal on admission. Her blood type is A negative and she received routine Rhophylac injection during her pregnancy.  She reports good fetal movement, denies LOF, vaginal bleeding, vaginal itching/burning, urinary symptoms, h/a, dizziness, n/v, or fever/chills.    CBG on admission 78  Maternal Medical History:  Reason for admission: Reason for Admission:   nauseaIOL for gestational diabetes, diet controlled, and favorable cervix  Contractions: Onset was 1-2 hours ago.   Frequency: irregular.   Perceived severity is mild.    Fetal activity: Perceived fetal activity is normal.   Last perceived fetal movement was within the past hour.    Prenatal complications: Hypertension.   Prenatal Complications - Diabetes: gestational. Diabetes is managed by diet.      OB History    Grav Para Term Preterm Abortions TAB SAB Ect Mult Living   6 4 4  0 1  1   4      Past Medical History  Diagnosis Date  . Complication of anesthesia     Epidural 1 sided  . Headache   . Depression     During 1 pregnancy  . Irregular periods   . Recurrent upper respiratory infection (URI)     Mostly when pregnant  . Diabetes mellitus without complication   . Gestational diabetes     just started checking BS 03/05/12  . Pregnancy induced hypertension   . Anemia   . Anxiety "fast hearbeat"    took Benadryl at end of pregnancy   Past Surgical History  Procedure Date  . Wisdom tooth extraction   . Multiple tooth extractions    Family History: family history includes Cancer in her maternal grandmother and paternal grandmother; Heart disease in her maternal grandmother; Heart failure in her maternal grandmother; and Hypertension in her father and mother. Social History:  reports that she quit  smoking about 13 years ago. Her smoking use included Cigarettes. She quit after .5 years of use. She has quit using smokeless tobacco. She reports that she does not drink alcohol or use illicit drugs.   Prenatal Transfer Tool  Maternal Diabetes: Yes:  Diabetes Type:  Diet controlled Genetic Screening: Normal Maternal Ultrasounds/Referrals: Normal Fetal Ultrasounds or other Referrals:  None Maternal Substance Abuse:  No Significant Maternal Medications:  None Significant Maternal Lab Results:  Lab values include: Group B Strep negative Other Comments:  None  Review of Systems  Constitutional: Negative for fever, chills and malaise/fatigue.  Eyes: Negative for blurred vision.  Respiratory: Negative for cough and shortness of breath.   Cardiovascular: Negative for chest pain.  Gastrointestinal: Negative for heartburn, nausea, vomiting and abdominal pain.  Genitourinary: Negative for dysuria, urgency and frequency.  Musculoskeletal: Negative.   Neurological: Negative for dizziness and headaches.  Psychiatric/Behavioral: Negative for depression.    Dilation: 3.5 Effacement (%): 60 Station: -2 Exam by:: Milt Coye Leftwhich-Kirby,CNM Blood pressure 131/73, pulse 105, temperature 98.9 F (37.2 C), temperature source Oral, resp. rate 20, height 5\' 6"  (1.676 m), weight 87.544 kg (193 lb), last menstrual period 07/19/2011. Maternal Exam:  Uterine Assessment: Contraction strength is mild.  Contraction frequency is rare.   Abdomen: Patient reports no abdominal tenderness. Estimated fetal weight is 2730g, 43%tile at 36 weeks.   Fetal presentation: vertex  Pelvis: adequate for delivery.   Cervix:  Cervix evaluated by digital exam.     Fetal Exam Fetal Monitor Review: Mode: ultrasound.   Baseline rate: 135.  Variability: moderate (6-25 bpm).   Pattern: accelerations present and no decelerations.    Fetal State Assessment: Category I - tracings are normal.     Physical Exam  Nursing  note and vitals reviewed. Constitutional: She is oriented to person, place, and time. She appears well-developed and well-nourished.  Neck: Normal range of motion.  Cardiovascular: Normal rate, regular rhythm and normal heart sounds.   Respiratory: Effort normal and breath sounds normal.  GI: Soft.  Genitourinary:       Dilation: 3.5 Effacement (%): 60 Station: -2 Presentation: Vertex Exam by:: Misty Stanley Leftwhich-Kirby,CNM  Musculoskeletal: Normal range of motion.  Neurological: She is alert and oriented to person, place, and time. She has normal reflexes.  Skin: Skin is warm and dry.  Psychiatric: She has a normal mood and affect. Her behavior is normal. Judgment and thought content normal.    Prenatal labs: ABO, Rh: A/Negative/-- (06/14 0000) Antibody: Negative (06/14 0000) Rubella: Immune (06/14 0000) RPR: Nonreactive (06/14 0000)  HBsAg: Negative (06/14 0000)  HIV: Non-reactive (06/14 0000)  GBS: Negative (01/13 0000)   Assessment/Plan: IOL for GDM at [redacted]w[redacted]d GBS negative  Pitocin started on admission Anticipate NSVD  LEFTWICH-KIRBY, [name] 05/10/2012, 10:21 AM

## 2012-05-11 MED ORDER — IBUPROFEN 100 MG/5ML PO SUSP: 600.0000 mg | Freq: Four times a day (QID) | ORAL | Status: DC | PRN

## 2012-05-11 MED ORDER — RHO D IMMUNE GLOBULIN 1500 UNIT/2ML IJ SOLN
300.0000 ug | Freq: Once | INTRAMUSCULAR | Status: AC
  Administered 2012-05-11: 300 ug via INTRAMUSCULAR
  Filled 2012-05-11: qty 2

## 2012-05-11 MED ORDER — COMPLETENATE 29-1 MG PO CHEW
1.0000 | CHEWABLE_TABLET | Freq: Every day | ORAL | Status: DC
  Administered 2012-05-11: 1 via ORAL
  Filled 2012-05-11 (×2): qty 1

## 2012-05-11 NOTE — Anesthesia Postprocedure Evaluation (Signed)
Anesthesia Post Note  Patient: [name]  Procedure(s) Performed: * No procedures listed *  Anesthesia type: Epidural  Patient location: Mother/Baby  Post pain: Pain level controlled  Post assessment: Post-op Vital signs reviewed  Last Vitals:  Filed Vitals:   05/11/12 0505  BP: 125/86  Pulse: 87  Temp: 36.6 C  Resp: 18    Post vital signs: Reviewed  Level of consciousness:alert  Complications: No apparent anesthesia complications

## 2012-05-11 NOTE — Discharge Summary (Signed)
Obstetric Discharge Summary Reason for Admission: induction of labor Prenatal Procedures: ultrasound and management of Class A1 diabetes Intrapartum Procedures: spontaneous vaginal delivery Postpartum Procedures: none Complications-Operative and Postpartum: none Hemoglobin  Date Value Range Status  05/10/2012 9.8* 12.0 - 15.0 g/dL Final     HCT  Date Value Range Status  05/10/2012 30.5* 36.0 - 46.0 % Final    Physical Exam:  General: alert, cooperative and no distress Lochia: few clots Uterine Fundus: firm DVT Evaluation: No evidence of DVT seen on physical exam. Negative Homan's sign.  Discharge Diagnoses: Term Pregnancy-delivered and gestational diabetes diet controlled  Discharge Information: Date: 05/11/2012 Activity: pelvic rest Diet: routine Medications: Ibuprofen Condition: stable Instructions: postpatum  Discharge to: home Follow-up Information    Follow up with [facility]. In 6 weeks.   Contact information:   [address] [place] [address] [phone]         Newborn Data: Live born female  Birth Weight: 7 lb 7.2 oz (3379 g) APGAR: 9, 9  Home with mother.  ARNOLD,JAMES 05/11/2012, 7:07 AM

## 2012-05-12 LAB — RH IG WORKUP (INCLUDES ABO/RH): Antibody Screen: POSITIVE

## 2012-05-12 NOTE — Progress Notes (Signed)
Post discharge chart review completed.  

## 2012-10-02 ENCOUNTER — Emergency Department (HOSPITAL_COMMUNITY)
Admission: EM | Admit: 2012-10-02 | Discharge: 2012-10-03 | Disposition: A | Discharge status: Home / Self Care | Payer: Self-pay | Attending: Emergency Medicine | Admitting: Emergency Medicine

## 2012-10-02 ENCOUNTER — Encounter (HOSPITAL_COMMUNITY): Payer: Self-pay | Admitting: *Deleted

## 2012-10-02 ENCOUNTER — Emergency Department (HOSPITAL_COMMUNITY): Payer: Self-pay

## 2012-10-02 DIAGNOSIS — R0789 Other chest pain: Secondary | ICD-10-CM | POA: Insufficient documentation

## 2012-10-02 DIAGNOSIS — Z87891 Personal history of nicotine dependence: Secondary | ICD-10-CM | POA: Insufficient documentation

## 2012-10-02 DIAGNOSIS — Z8632 Personal history of gestational diabetes: Secondary | ICD-10-CM | POA: Insufficient documentation

## 2012-10-02 DIAGNOSIS — R42 Dizziness and giddiness: Secondary | ICD-10-CM | POA: Insufficient documentation

## 2012-10-02 DIAGNOSIS — Z8679 Personal history of other diseases of the circulatory system: Secondary | ICD-10-CM | POA: Insufficient documentation

## 2012-10-02 DIAGNOSIS — Z79899 Other long term (current) drug therapy: Secondary | ICD-10-CM | POA: Insufficient documentation

## 2012-10-02 DIAGNOSIS — R002 Palpitations: Secondary | ICD-10-CM | POA: Insufficient documentation

## 2012-10-02 DIAGNOSIS — R064 Hyperventilation: Secondary | ICD-10-CM | POA: Insufficient documentation

## 2012-10-02 DIAGNOSIS — R110 Nausea: Secondary | ICD-10-CM | POA: Insufficient documentation

## 2012-10-02 DIAGNOSIS — E119 Type 2 diabetes mellitus without complications: Secondary | ICD-10-CM | POA: Insufficient documentation

## 2012-10-02 DIAGNOSIS — Z8709 Personal history of other diseases of the respiratory system: Secondary | ICD-10-CM | POA: Insufficient documentation

## 2012-10-02 DIAGNOSIS — D649 Anemia, unspecified: Secondary | ICD-10-CM | POA: Insufficient documentation

## 2012-10-02 DIAGNOSIS — R0602 Shortness of breath: Secondary | ICD-10-CM | POA: Insufficient documentation

## 2012-10-02 DIAGNOSIS — Z8659 Personal history of other mental and behavioral disorders: Secondary | ICD-10-CM | POA: Insufficient documentation

## 2012-10-02 DIAGNOSIS — F410 Panic disorder [episodic paroxysmal anxiety] without agoraphobia: Secondary | ICD-10-CM | POA: Insufficient documentation

## 2012-10-02 DIAGNOSIS — F419 Anxiety disorder, unspecified: Secondary | ICD-10-CM

## 2012-10-02 DIAGNOSIS — R209 Unspecified disturbances of skin sensation: Secondary | ICD-10-CM | POA: Insufficient documentation

## 2012-10-02 LAB — CBC WITH DIFFERENTIAL/PLATELET
Basophils Relative: 0 % (ref 0–1)
HCT: 39.2 % (ref 36.0–46.0)
Hemoglobin: 13.2 g/dL (ref 12.0–15.0)
Lymphocytes Relative: 33 % (ref 12–46)
MCHC: 33.7 g/dL (ref 30.0–36.0)
Monocytes Absolute: 0.7 10*3/uL (ref 0.1–1.0)
Monocytes Relative: 10 % (ref 3–12)
Neutro Abs: 3.7 10*3/uL (ref 1.7–7.7)

## 2012-10-02 MED ORDER — LORAZEPAM 2 MG/ML IJ SOLN
1.0000 mg | Freq: Once | INTRAMUSCULAR | Status: DC
  Filled 2012-10-02: qty 1

## 2012-10-02 NOTE — ED Provider Notes (Signed)
History    This chart was scribed for Dione Booze, MD by Donne Anon, ED Scribe. This patient was seen in room APA14/APA14 and the patient's care was started at 2317.  CSN: [id number] Arrival date & time 10/02/12  2237  First MD Initiated Contact with Patient 10/02/12 2317     Chief Complaint  Patient presents with  . Panic Attack  . Shortness of Breath  . Dizziness   The history is provided by the patient and the spouse. No language interpreter was used.    HPI Comments: [name] is a 32 y.o. female who presents to the Emergency Department complaining of gradual onset, intermittent, moderate palpitations and tingling in her arms, hands, lips and forehead which began 4 months ago. She states this began when she was pregnant. She reports chest tightness and CP with deep breaths with associated nausea that typically last 30 minutes. She also reports associated dry mouth and dizziness. She reports she has had panic attacks before and that this feels similar. She denies any other pain at this time.  [facility] is her PCP.  Past Medical History  Diagnosis Date  . Complication of anesthesia     Epidural 1 sided  . Headache(784.0)   . Depression     During 1 pregnancy  . Irregular periods   . Recurrent upper respiratory infection (URI)     Mostly when pregnant  . Diabetes mellitus without complication   . Gestational diabetes     just started checking BS 03/05/12  . Pregnancy induced hypertension   . Anemia   . Anxiety "fast hearbeat"    took Benadryl at end of pregnancy   Past Surgical History  Procedure Laterality Date  . Wisdom tooth extraction    . Multiple tooth extractions     Family History  Problem Relation Age of Onset  . Heart disease Maternal Grandmother   . Cancer Maternal Grandmother     brain  . Heart failure Maternal Grandmother   . Cancer Paternal Grandmother     brain  . Hypertension Mother   . Hypertension Father    History   Substance Use Topics  . Smoking status: Former Smoker -- .5 years    Types: Cigarettes    Quit date: 09/17/1998  . Smokeless tobacco: Former Neurosurgeon  . Alcohol Use: No     Comment: not while pregnant   OB History   Grav Para Term Preterm Abortions TAB SAB Ect Mult Living   6 5 5  0 1  1   5      Review of Systems  Constitutional: Negative for fever.  Respiratory: Positive for chest tightness and shortness of breath.   Gastrointestinal: Positive for nausea.  Neurological: Positive for numbness.  All other systems reviewed and are negative.    Allergies  Review of patient's allergies indicates no known allergies.  Home Medications   Current Outpatient Rx  Name  Route  Sig  Dispense  Refill  . diphenhydrAMINE (BENADRYL) 25 mg capsule   Oral   Take 25 mg by mouth every 6 (six) hours as needed. Patient uses for anxiety         . ibuprofen (ADVIL,MOTRIN) 100 MG/5ML suspension   Oral   Take 30 mLs (600 mg total) by mouth every 6 (six) hours as needed.   240 mL   1   . multivitamin (VIT W/EXTRA C) CHEW   Oral   Chew 2 tablets by mouth daily.  BP 128/83  Pulse 103  Temp(Src) 98.5 F (36.9 C) (Oral)  Resp 18  SpO2 98%  LMP 08/28/2012  Breastfeeding? Yes  Physical Exam  Nursing note and vitals reviewed. Constitutional: She appears well-developed and well-nourished. No distress.  HENT:  Head: Normocephalic and atraumatic.  Eyes: Conjunctivae are normal.  Neck: Neck supple. No tracheal deviation present.  Cardiovascular: Normal rate.   Pulmonary/Chest: Effort normal. No respiratory distress.  Musculoskeletal: Normal range of motion.  Neurological: She is alert.  Skin: Skin is warm and dry.  Psychiatric: She has a normal mood and affect. Her behavior is normal.    ED Course  Procedures (including critical care time) DIAGNOSTIC STUDIES: Oxygen Saturation is 98% on RA, normal by my interpretation.    COORDINATION OF CARE: 11:25 PM Discussed treatment  plan which includes CXR, labs and medication with pt at bedside and pt agreed to plan.    Results for orders placed during the hospital encounter of 10/02/12  CBC WITH DIFFERENTIAL      Result Value Range   WBC 6.8  4.0 - 10.5 K/uL   RBC 4.54  3.87 - 5.11 MIL/uL   Hemoglobin 13.2  12.0 - 15.0 g/dL   HCT 16.1  09.6 - 04.5 %   MCV 86.3  78.0 - 100.0 fL   MCH 29.1  26.0 - 34.0 pg   MCHC 33.7  30.0 - 36.0 g/dL   RDW 40.9  81.1 - 91.4 %   Platelets 281  150 - 400 K/uL   Neutrophils Relative % 55  43 - 77 %   Neutro Abs 3.7  1.7 - 7.7 K/uL   Lymphocytes Relative 33  12 - 46 %   Lymphs Abs 2.3  0.7 - 4.0 K/uL   Monocytes Relative 10  3 - 12 %   Monocytes Absolute 0.7  0.1 - 1.0 K/uL   Eosinophils Relative 1  0 - 5 %   Eosinophils Absolute 0.1  0.0 - 0.7 K/uL   Basophils Relative 0  0 - 1 %   Basophils Absolute 0.0  0.0 - 0.1 K/uL  BASIC METABOLIC PANEL      Result Value Range   Sodium 138  135 - 145 mEq/L   Potassium 3.6  3.5 - 5.1 mEq/L   Chloride 99  96 - 112 mEq/L   CO2 27  19 - 32 mEq/L   Glucose, Bld 102 (*) 70 - 99 mg/dL   BUN 14  6 - 23 mg/dL   Creatinine, Ser 7.82  0.50 - 1.10 mg/dL   Calcium 95.6  8.4 - 21.3 mg/dL   GFR calc non Af Amer >90  >90 mL/min   GFR calc Af Amer >90  >90 mL/min  D-DIMER, QUANTITATIVE      Result Value Range   D-Dimer, Quant 0.30  0.00 - 0.48 ug/mL-FEU  POCT PREGNANCY, URINE      Result Value Range   Preg Test, Ur NEGATIVE  NEGATIVE   Dg Chest 2 View  10/03/2012   *RADIOLOGY REPORT*  Clinical Data: Panic attack, shortness of breath.  CHEST - 2 VIEW  Comparison: 12/20/2009  Findings: Heart and mediastinal contours are within normal limits. No focal opacities or effusions.  No acute bony abnormality.  IMPRESSION: Normal study.   Original Report Authenticated By: Charlett Nose, M.D.     Date: 10/03/2012  Rate: 108  Rhythm: sinus tachycardia  QRS Axis: normal  Intervals: normal  ST/T Wave abnormalities: normal  Conduction Disutrbances:none  Narrative Interpretation: Sinus tachycardia, otherwise normal ECG. When compared with ECG of 03/05/2012, no significant changes are seen  Old EKG Reviewed: unchanged   1. Anxiety   2. Hyperventilation     MDM  Difficulty breathing with numbness and tingling most consistent with hyperventilation syndrome. ECG shows no acute changes and a laboratory testing is unremarkable. She is currently breast-feeding, so I am reluctant to use benzodiazepines for her. Workup was done including chest x-ray and d-dimer because of her recent pregnancy. All tests have come back normal. Patient is advised of negative workup and given instructions on how to treat hyperventilation when it occurs. Patient expresses understanding.  I personally performed the services described in this documentation, which was scribed in my presence. The recorded information has been reviewed and is accurate.      Dione Booze, MD 10/03/12 [phone]

## 2012-10-02 NOTE — ED Notes (Signed)
Pt co sob, tingling in arms and forehead, pt also states she has had panic attacks before and the feeling is similar.

## 2012-10-03 LAB — BASIC METABOLIC PANEL
BUN: 14 mg/dL (ref 6–23)
CO2: 27 mEq/L (ref 19–32)
Chloride: 99 mEq/L (ref 96–112)
Creatinine, Ser: 0.59 mg/dL (ref 0.50–1.10)
Glucose, Bld: 102 mg/dL — ABNORMAL HIGH (ref 70–99)

## 2012-10-03 NOTE — ED Notes (Signed)
Discharge instructions reviewed with pt, questions answered. Pt verbalized understanding.  

## 2013-09-25 ENCOUNTER — Encounter (HOSPITAL_COMMUNITY): Payer: Self-pay | Admitting: Emergency Medicine

## 2013-09-25 ENCOUNTER — Emergency Department (HOSPITAL_COMMUNITY): Payer: Medicaid Other

## 2013-09-25 ENCOUNTER — Emergency Department (HOSPITAL_COMMUNITY)
Admission: EM | Admit: 2013-09-25 | Discharge: 2013-09-25 | Disposition: A | Discharge status: Home / Self Care | Payer: Medicaid Other | Attending: Emergency Medicine | Admitting: Emergency Medicine

## 2013-09-25 DIAGNOSIS — R071 Chest pain on breathing: Secondary | ICD-10-CM | POA: Insufficient documentation

## 2013-09-25 DIAGNOSIS — J069 Acute upper respiratory infection, unspecified: Secondary | ICD-10-CM | POA: Insufficient documentation

## 2013-09-25 DIAGNOSIS — Z791 Long term (current) use of non-steroidal anti-inflammatories (NSAID): Secondary | ICD-10-CM | POA: Insufficient documentation

## 2013-09-25 DIAGNOSIS — Z8679 Personal history of other diseases of the circulatory system: Secondary | ICD-10-CM | POA: Insufficient documentation

## 2013-09-25 DIAGNOSIS — E119 Type 2 diabetes mellitus without complications: Secondary | ICD-10-CM | POA: Insufficient documentation

## 2013-09-25 DIAGNOSIS — Z862 Personal history of diseases of the blood and blood-forming organs and certain disorders involving the immune mechanism: Secondary | ICD-10-CM | POA: Insufficient documentation

## 2013-09-25 DIAGNOSIS — Z8659 Personal history of other mental and behavioral disorders: Secondary | ICD-10-CM | POA: Insufficient documentation

## 2013-09-25 DIAGNOSIS — R0789 Other chest pain: Secondary | ICD-10-CM

## 2013-09-25 DIAGNOSIS — Z87891 Personal history of nicotine dependence: Secondary | ICD-10-CM | POA: Insufficient documentation

## 2013-09-25 DIAGNOSIS — Z8742 Personal history of other diseases of the female genital tract: Secondary | ICD-10-CM | POA: Insufficient documentation

## 2013-09-25 MED ORDER — FLUTICASONE PROPIONATE 50 MCG/ACT NA SUSP: 1.0000 | Freq: Every day | NASAL | Status: DC

## 2013-09-25 MED ORDER — HYDROCODONE-HOMATROPINE 5-1.5 MG/5ML PO SYRP: 5.0000 mL | ORAL_SOLUTION | Freq: Four times a day (QID) | ORAL | Status: DC | PRN

## 2013-09-25 MED ORDER — CETIRIZINE HCL 5 MG/5ML PO SYRP: 5.0000 mg | ORAL_SOLUTION | Freq: Every day | ORAL | Status: DC

## 2013-09-25 MED ORDER — ALBUTEROL SULFATE HFA 108 (90 BASE) MCG/ACT IN AERS: 2.0000 | INHALATION_SPRAY | RESPIRATORY_TRACT | Status: DC | PRN

## 2013-09-25 NOTE — ED Notes (Signed)
Pt reports pain in chest with movment, coughing, and deep breathing.  Reports has had productive cough with clear sputum.  Denies fever.

## 2013-09-25 NOTE — ED Provider Notes (Signed)
CSN: [id number]     Arrival date & time 09/25/13  1619 History   First MD Initiated Contact with Patient 09/25/13 1738     Chief Complaint  Patient presents with  . Chest Pain     (Consider location/radiation/quality/duration/timing/severity/associated sxs/prior Treatment) HPI Comments: Patient presents to the ER for evaluation of chest discomfort the patient reports that she has had increased sinus congestion, sinus drainage, cough, chest discomfort for the last 2 or 3 days. She reports that the pain is in the center of her chest and worsens when she bends over, twists, turns, coughs or takes a deep breath. There has not been any fever.  Patient is a 33 y.o. female presenting with chest pain.  Chest Pain Associated symptoms: cough     Past Medical History  Diagnosis Date  . Complication of anesthesia     Epidural 1 sided  . Headache(784.0)   . Depression     During 1 pregnancy  . Irregular periods   . Recurrent upper respiratory infection (URI)     Mostly when pregnant  . Diabetes mellitus without complication   . Gestational diabetes     just started checking BS 03/05/12  . Pregnancy induced hypertension   . Anemia   . Anxiety "fast hearbeat"    took Benadryl at end of pregnancy   Past Surgical History  Procedure Laterality Date  . Wisdom tooth extraction    . Multiple tooth extractions     Family History  Problem Relation Age of Onset  . Heart disease Maternal Grandmother   . Cancer Maternal Grandmother     brain  . Heart failure Maternal Grandmother   . Cancer Paternal Grandmother     brain  . Hypertension Mother   . Hypertension Father    History  Substance Use Topics  . Smoking status: Former Smoker -- .5 years    Types: Cigarettes    Quit date: 09/17/1998  . Smokeless tobacco: Former NeurosurgeonUser  . Alcohol Use: No     Comment: not while pregnant   OB History   Grav Para Term Preterm Abortions TAB SAB Ect Mult Living   6 5 5  0 1  1   5      Review of  Systems  HENT: Positive for congestion.   Respiratory: Positive for cough.   Cardiovascular: Positive for chest pain.  All other systems reviewed and are negative.     Allergies  Review of patient's allergies indicates no known allergies.  Home Medications   Prior to Admission medications   Medication Sig Start Date End Date Taking? Authorizing Provider  diphenhydrAMINE (BENADRYL) 25 mg capsule Take 25 mg by mouth every 6 (six) hours as needed. Patient uses for anxiety    Historical Provider, MD  ibuprofen (ADVIL,MOTRIN) 100 MG/5ML suspension Take 30 mLs (600 mg total) by mouth every 6 (six) hours as needed. 05/11/12   Adam PhenixJames G Arnold, MD  multivitamin (VIT Lorel MonacoW/EXTRA C) CHEW Chew 2 tablets by mouth daily.    Historical Provider, MD   BP 149/85  Pulse 108  Temp(Src) 98.4 F (36.9 C) (Oral)  Resp 20  SpO2 99%  LMP 09/02/2013 Physical Exam  Constitutional: She is oriented to person, place, and time. She appears well-developed and well-nourished. No distress.  HENT:  Head: Normocephalic and atraumatic.  Right Ear: Hearing normal.  Left Ear: Hearing normal.  Nose: Nose normal.  Mouth/Throat: Oropharynx is clear and moist and mucous membranes are normal.  Eyes: Conjunctivae and EOM are  normal. Pupils are equal, round, and reactive to light.  Neck: Normal range of motion. Neck supple.  Cardiovascular: Regular rhythm, S1 normal and S2 normal.  Exam reveals no gallop and no friction rub.   No murmur heard. Pulmonary/Chest: Effort normal and breath sounds normal. No respiratory distress. She exhibits tenderness.    Abdominal: Soft. Normal appearance and bowel sounds are normal. There is no hepatosplenomegaly. There is no tenderness. There is no rebound, no guarding, no tenderness at McBurney's point and negative Murphy's sign. No hernia.  Musculoskeletal: Normal range of motion.  Neurological: She is alert and oriented to person, place, and time. She has normal strength. No cranial nerve  deficit or sensory deficit. Coordination normal. GCS eye subscore is 4. GCS verbal subscore is 5. GCS motor subscore is 6.  Skin: Skin is warm, dry and intact. No rash noted. No cyanosis.  Psychiatric: She has a normal mood and affect. Her speech is normal and behavior is normal. Thought content normal.    ED Course  Procedures (including critical care time) Labs Review Labs Reviewed - No data to display  Imaging Review Dg Chest 2 View  09/25/2013   CLINICAL DATA:  Productive cough and chest pain.  EXAM: CHEST  2 VIEW  COMPARISON:  10/02/2012  FINDINGS: Two views of the chest were obtained. Evidence for bilateral nipple shadows. No evidence for airspace disease or pulmonary edema. Heart and mediastinum are within normal limits. The trachea is midline. No acute bone abnormality.  IMPRESSION: No acute cardiopulmonary disease.   Electronically Signed   By: Richarda OverlieAdam  Henn M.D.   On: 09/25/2013 16:59     EKG Interpretation   Date/Time:  Friday September 25 2013 16:44:50 EDT Ventricular Rate:  96 PR Interval:  170 QRS Duration: 92 QT Interval:  348 QTC Calculation: 439 R Axis:   81 Text Interpretation:  Normal sinus rhythm Normal ECG Confirmed by POLLINA   MD, CHRISTOPHER [id number]) on 09/25/2013 5:39:59 PM      MDM   Final diagnoses:  None   URI  Chest wall pain  Presents to the ER for evaluation of upper respiratory infection symptoms with chest discomfort. No acute bronchospasm seen upon arrival. Chest is tender to palpation and pain is also reproducible with movement of the torso. This is consistent with musculoskeletal chest wall pain.  I have no suspicion for PE, she does have cough and upper respiratory infection symptoms I explained the current complaints. EKG is negative. Chest x-ray was unremarkable.  Gilda Creasehristopher J. Pollina, MD 09/25/13 [phone]

## 2013-09-25 NOTE — Discharge Instructions (Signed)

## 2014-02-08 ENCOUNTER — Encounter (HOSPITAL_COMMUNITY): Payer: Self-pay | Admitting: Emergency Medicine

## 2014-09-28 ENCOUNTER — Emergency Department (HOSPITAL_COMMUNITY)
Admission: EM | Admit: 2014-09-28 | Discharge: 2014-09-28 | Disposition: A | Discharge status: Home / Self Care | Payer: Medicaid Other | Attending: Emergency Medicine | Admitting: Emergency Medicine

## 2014-09-28 ENCOUNTER — Encounter (HOSPITAL_COMMUNITY): Payer: Self-pay

## 2014-09-28 DIAGNOSIS — E119 Type 2 diabetes mellitus without complications: Secondary | ICD-10-CM | POA: Diagnosis not present

## 2014-09-28 DIAGNOSIS — Z8742 Personal history of other diseases of the female genital tract: Secondary | ICD-10-CM | POA: Diagnosis not present

## 2014-09-28 DIAGNOSIS — Z79899 Other long term (current) drug therapy: Secondary | ICD-10-CM | POA: Insufficient documentation

## 2014-09-28 DIAGNOSIS — Z7951 Long term (current) use of inhaled steroids: Secondary | ICD-10-CM | POA: Diagnosis not present

## 2014-09-28 DIAGNOSIS — T679XXA Effect of heat and light, unspecified, initial encounter: Secondary | ICD-10-CM | POA: Insufficient documentation

## 2014-09-28 DIAGNOSIS — Y998 Other external cause status: Secondary | ICD-10-CM | POA: Insufficient documentation

## 2014-09-28 DIAGNOSIS — F419 Anxiety disorder, unspecified: Secondary | ICD-10-CM | POA: Insufficient documentation

## 2014-09-28 DIAGNOSIS — L559 Sunburn, unspecified: Secondary | ICD-10-CM | POA: Insufficient documentation

## 2014-09-28 DIAGNOSIS — Y9289 Other specified places as the place of occurrence of the external cause: Secondary | ICD-10-CM | POA: Insufficient documentation

## 2014-09-28 DIAGNOSIS — Z3202 Encounter for pregnancy test, result negative: Secondary | ICD-10-CM | POA: Insufficient documentation

## 2014-09-28 DIAGNOSIS — Z87891 Personal history of nicotine dependence: Secondary | ICD-10-CM | POA: Diagnosis not present

## 2014-09-28 DIAGNOSIS — Z8632 Personal history of gestational diabetes: Secondary | ICD-10-CM | POA: Diagnosis not present

## 2014-09-28 DIAGNOSIS — Y9389 Activity, other specified: Secondary | ICD-10-CM | POA: Insufficient documentation

## 2014-09-28 DIAGNOSIS — Z862 Personal history of diseases of the blood and blood-forming organs and certain disorders involving the immune mechanism: Secondary | ICD-10-CM | POA: Diagnosis not present

## 2014-09-28 DIAGNOSIS — X30XXXA Exposure to excessive natural heat, initial encounter: Secondary | ICD-10-CM | POA: Diagnosis not present

## 2014-09-28 DIAGNOSIS — R42 Dizziness and giddiness: Secondary | ICD-10-CM | POA: Diagnosis present

## 2014-09-28 LAB — URINE MICROSCOPIC-ADD ON

## 2014-09-28 LAB — COMPREHENSIVE METABOLIC PANEL
ALBUMIN: 3.9 g/dL (ref 3.5–5.0)
ALK PHOS: 27 U/L — AB (ref 38–126)
ALT: 14 U/L (ref 14–54)
ANION GAP: 9 (ref 5–15)
AST: 16 U/L (ref 15–41)
BUN: 11 mg/dL (ref 6–20)
CALCIUM: 9.2 mg/dL (ref 8.9–10.3)
CO2: 25 mmol/L (ref 22–32)
CREATININE: 0.69 mg/dL (ref 0.44–1.00)
Chloride: 100 mmol/L — ABNORMAL LOW (ref 101–111)
GFR calc non Af Amer: >60 mL/min (ref >60)
GLUCOSE: 127 mg/dL — AB (ref 65–99)
Potassium: 3.8 mmol/L (ref 3.5–5.1)
SODIUM: 134 mmol/L — AB (ref 135–145)
TOTAL PROTEIN: 7.6 g/dL (ref 6.5–8.1)
Total Bilirubin: 1.8 mg/dL — ABNORMAL HIGH (ref 0.3–1.2)

## 2014-09-28 LAB — PREGNANCY, URINE: Preg Test, Ur: NEGATIVE

## 2014-09-28 LAB — URINALYSIS, ROUTINE W REFLEX MICROSCOPIC
Bilirubin Urine: NEGATIVE
GLUCOSE, UA: NEGATIVE mg/dL
Hgb urine dipstick: NEGATIVE
KETONES UR: NEGATIVE mg/dL
Nitrite: NEGATIVE
PH: 6 (ref 5.0–8.0)
Protein, ur: NEGATIVE mg/dL
SPECIFIC GRAVITY, URINE: 1.025 (ref 1.005–1.030)
Urobilinogen, UA: 0.2 mg/dL (ref 0.0–1.0)

## 2014-09-28 LAB — CBC WITH DIFFERENTIAL/PLATELET
BASOS ABS: 0 10*3/uL (ref 0.0–0.1)
BASOS PCT: 0 % (ref 0–1)
Eosinophils Absolute: 0 10*3/uL (ref 0.0–0.7)
Eosinophils Relative: 0 % (ref 0–5)
HEMATOCRIT: 46.1 % — AB (ref 36.0–46.0)
Hemoglobin: 15.7 g/dL — ABNORMAL HIGH (ref 12.0–15.0)
LYMPHS ABS: 1.2 10*3/uL (ref 0.7–4.0)
LYMPHS PCT: 7 % — AB (ref 12–46)
MCH: 30.4 pg (ref 26.0–34.0)
MCHC: 34.1 g/dL (ref 30.0–36.0)
MCV: 89.2 fL (ref 78.0–100.0)
MONO ABS: 1.9 10*3/uL — AB (ref 0.1–1.0)
Monocytes Relative: 11 % (ref 3–12)
Neutro Abs: 13.9 10*3/uL — ABNORMAL HIGH (ref 1.7–7.7)
Neutrophils Relative %: 82 % — ABNORMAL HIGH (ref 43–77)
Platelets: 294 10*3/uL (ref 150–400)
RBC: 5.17 MIL/uL — AB (ref 3.87–5.11)
RDW: 13.5 % (ref 11.5–15.5)
WBC: 17 10*3/uL — AB (ref 4.0–10.5)

## 2014-09-28 LAB — LIPASE, BLOOD: Lipase: 12 U/L — ABNORMAL LOW (ref 22–51)

## 2014-09-28 MED ORDER — ONDANSETRON HCL 4 MG/2ML IJ SOLN
4.0000 mg | INTRAMUSCULAR | Status: DC | PRN
  Administered 2014-09-28: 4 mg via INTRAVENOUS
  Filled 2014-09-28: qty 2

## 2014-09-28 MED ORDER — SODIUM CHLORIDE 0.9 % IV BOLUS (SEPSIS)
2000.0000 mL | Freq: Once | INTRAVENOUS | Status: AC
  Administered 2014-09-28: 2000 mL via INTRAVENOUS

## 2014-09-28 NOTE — ED Notes (Signed)
Pt states she lay out in the sun yesterday for several hours. Complain of pain, nausea, dizziness and many other complaints

## 2014-09-28 NOTE — Discharge Instructions (Signed)
°Emergency Department Resource Guide °1) Find a Doctor and Pay Out of Pocket °Although you won't have to find out who is covered by your insurance plan, it is a good idea to ask around and get recommendations. You will then need to call the office and see if the doctor you have chosen will accept you as a new patient and what types of options they offer for patients who are self-pay. Some doctors offer discounts or will set up payment plans for their patients who do not have insurance, but you will need to ask so you aren't surprised when you get to your appointment. ° °2) Contact Your Local Health Department °Not all health departments have doctors that can see patients for sick visits, but many do, so it is worth a call to see if yours does. If you don't know where your local health department is, you can check in your phone book. The CDC also has a tool to help you locate your state's health department, and many state websites also have listings of all of their local health departments. ° °3) Find a Walk-in Clinic °If your illness is not likely to be very severe or complicated, you may want to try a walk in clinic. These are popping up all over the country in pharmacies, drugstores, and shopping centers. They're usually staffed by nurse practitioners or physician assistants that have been trained to treat common illnesses and complaints. They're usually fairly quick and inexpensive. However, if you have serious medical issues or chronic medical problems, these are probably not your best option. ° °No Primary Care Doctor: °- Call [facility] Connect at  832-8000 - they can help you locate a primary care doctor that  accepts your insurance, provides certain services, etc. °- Physician Referral Service- 1-800-533-3463 ° °Chronic Pain Problems: °Organization         Address  Phone   Notes  °[facility]  (336) 297-2271 Patients need to be referred by their primary care doctor.  ° °Medication  Assistance: °Organization         Address  Phone   Notes  °[place] [facility] [address] °[place], [place] [address] (336) 641-8030 --Must be a resident of [place] °-- Must have NO insurance coverage whatsoever (no Medicaid/ Medicare, etc.) °-- The pt. MUST have a primary care doctor that directs their care regularly and follows them in the community °  °[facility]  (866) 331-1348   °[employer]  (888) 892-1162   ° °Agencies that provide inexpensive medical care: °Organization         Address  Phone   Notes  °[facility]  (336) 832-8035   °[facility]    (336) 832-7272   °[facility] [address] °[place], [place] [address] (336) 832-4777   °[facility] [address], °[place] (336) 271-4999   °[facility]    (336) 373-0678   °[facility]    (336) 272-1050   °[facility] ° [address], [place] Phone:  (336) 832-4444, Fax:  (336) 832-4440 Hours of Operation:  9 am - 6 pm, M-F.  Also accepts Medicaid/Medicare and self-pay.  °[facility] ° [address], [place] Phone: (336) 832-3150, Fax: (336) 832-3151. Hours of Operation:  8:30 am - 5:30 pm, M-F.  Also accepts Medicaid and self-pay.  °[facility] [place] [address], [place] Phone: (336) 878-6027   °[facility] [address], [place], [place] (336)723-1848, Ext. 123 Mondays & Thursdays: 7-9 AM.  First 15 patients are seen on a first come, first serve basis. °  ° °Medicaid-accepting [place] Providers: ° °Organization         Address  Phone   Notes  °[facility] [address], [place] (336) 641-2100 Also accepts self-pay patients.  °[facility] [address], [place] ° (336) 856-9996   °[facility] [address], [place]  (336) 288-8857   °[facility] [address] [place] [address], [place] (336) 299-7000   °Veita Bland [address], [place]  ° (336) 373-1557 Only accepts [place] Access Medicaid patients after they have their name applied to their card.  ° °Self-Pay (no insurance) in [place]: ° °Organization         Address  Phone   Notes  °Sickle Cell Patients, [facility] [address], [place] (336) 832-1970   °[facility] [address], [place] (336) 832-4400   °[facility] ° [address], [place] (336) 992-4800   °[facility]/Dr. Osei-Bonsu ° [address] [place] [address], [place] or [address], [place] (336) 841-8500 Phone number for both [place] and [place] locations is the same.  °[facility] [address], [place] (336) 299-0000   °[facility] [address] [place] [address], [place] or [address] (336) 852-7530 °(336) 878-2260   °[facility] [address], [place] (336) 350-1642, phone; (336) 294-5005, fax Sees patients 1st and 3rd Saturday of every month.  Must not qualify for public or private insurance (i.e. Medicaid, Medicare, [place] Health Choice, Veterans' Benefits) • Household income should be no more than 200% of the poverty level •The clinic cannot treat you if you are pregnant or think you are pregnant • Sexually transmitted diseases are not treated at the clinic.  ° ° °Dental Care: °Organization         Address  Phone  Notes  °[place] [facility] [address], [place] (336) 641-6152 Accepts children up to age 21 who are enrolled in Medicaid or [place] Health Choice; pregnant women with a Medicaid card; and children who have applied for Medicaid or [place] Health Choice, but were declined, whose parents can pay a reduced fee at time of service.  °[place]  [facility] [place]  [address], [place] (336) 641-7733 Accepts children up to age 21 who are enrolled in Medicaid or [place] Health Choice; pregnant women with a Medicaid card; and children who have applied for Medicaid or [place] Health Choice, but were declined, whose parents can pay a reduced fee at time of service.  °[facility] ° [address], [place] (336) 641-4533 Patients are seen by appointment only. Walk-ins are not accepted. [facility] will see patients 18 years of age and older. °Monday - Tuesday (8am-5pm) °Most Wednesdays (8:30-5pm) °$30 per visit, cash only  °[facility] ° [address], [place] (336) 641-4533 Patients are seen by appointment only. Walk-ins are not accepted. [facility] will see patients 18 years of age and older. °One   Wednesday Evening (Monthly: Volunteer Based).  $30 per visit, cash only  °[facility]  (919) 537-3737 for adults; Children under age 4, call Graduate Pediatric Dentistry at (919) [phone]. Children aged 4-14, please call (919) [phone] to request a pediatric application. ° Dental services are provided in all areas of dental care including fillings, crowns and bridges, complete and partial dentures, implants, gum treatment, root canals, and extractions. Preventive care is also provided. Treatment is provided to both adults and children. °Patients are selected via a lottery and there is often a waiting list. °  °[facility] [address], °[place] ° (336) 763-8833 [email or web address] °  °[facility] [address], [place], [place] (336)723-1848, Ext. 123 Second and Fourth Thursday of each month, opens at 6:30 AM; Clinic ends at 9 AM.  Patients are seen on a first-come first-served basis, and a limited number are seen during each clinic.  ° °[facility] ° [address], [place], [place] (336) 723-7904    Eligibility Requirements °You must have lived in [place], [place], or [place] counties for at least the last three months. °  You cannot be eligible for state or federal sponsored healthcare insurance, including Veterans Administration, Medicaid, or Medicare. °  You generally cannot be eligible for healthcare insurance through your employer.  °  How to apply: °Eligibility screenings are held every Tuesday and Wednesday afternoon from 1:00 pm until 4:00 pm. You do not need an appointment for the interview!  °[facility] [address], [place], [place] 336-631-2330   °[facility]  336-342-8273   °[facility]  336-703-3100   °[facility]  336-570-6415   ° °Behavioral Health Resources in the Community: °Intensive Outpatient Programs °Organization         Address  Phone  Notes  °[facility] [address], [place], [place] 336-878-6098   °[facility] [address], [place], [place] 336-832-9800   °[facility] [address], [place], [place] ° 336-882-2125   °[facility] [address],  °[place], [place] 1-800-853-5163 or 336-641-4981   °Substance Abuse Resources °Organization         Address  Phone  Notes  °[facility]  336-882-2125   °[facility]  336-784-9470   °[facility]  336-285-9073   °[facility]  336-845-3988   °Residential & Outpatient Substance Abuse Program  1-800-659-3381   °Psychological Services °Organization         Address  Phone  Notes  °[facility]  336- 832-9600   °[facility]  336- 378-7881   °[facility] [address], [place] 1-800-853-5163 or 336-641-4981   ° °Mobile Crisis Teams °Organization         Address  Phone  Notes  °[facility], Mobile Crisis Care Unit  1-877-626-1772   °Assertive °[facility] ° [address]  [place], [place] 336-834-9664   °Sharon DeEsch [address] °[place] 336-554-5454   ° °Self-Help/Support Groups °Organization         Address  Phone             Notes  °[facility]  - variety of support groups  336- 373-1402 Call for more information  °Narcotics Anonymous (NA), [facility] [address], °[place]  2 meetings at this location  ° °  Residential Treatment Programs Organization         Address  Phone  Notes  [facility] [address]    [place]  0-254-270-6237   [facility]  [address], [place] [address], [place], [place] 176-160-7371   [facility] [address] [place], [place] 062-694-8546 Admissions: 8am-3pm M-F  [facility] [address],    [place], [place] 270-350-0938   [facility] [address] [place], [place], [place] 182-993-7169   [facility] [address],  [place], [place] 678-938-1017   [facility] - Intensive Outpatient [address], [place], [place] 510-258-5277   [facility] ([facility] [address]  [place], [place] 8-242-353-6144 or 873-072-8653   [facility] ([facility] [address] [place], [place] 195-093-2671 Accepts Medicaid  [facility] [address]  [place] 2-458-099-8338 Substance Abuse/Addiction Treatment   [facility] Organization         Address  Phone  Notes  [facility]  226-188-5781   Angie Fava, PhD [address] [place] [place] [place], [place]   430-097-6854 or 226 828 4573   [facility]   [address] [place], [place] (820)736-8304   [facility] [address] [place], [place] 541-729-1192 Insurance/Medicaid/sponsorship through [facility] [address]                                    [place], [place] 475-731-4646 Therapy/tele-psych/case    [facility] [address] [place], [place] (276)622-7697    Dr. Lolly Mustache  251-676-6109   [facility]  [employer] [place] [facility] 1) [address] [place] 2) [address], [place] 3)  [address], [place] [phone] 435-622-4647  502-769-3826   [facility] Child Abuse Hotline (579) 441-7812 or (681) 071-3886 (After Hours)      Increase your fluid intake (ie:  Gatoraide) for the next few days.  Eat a bland diet and advance to your regular diet slowly as you can tolerate it. Take your usual prescriptions as previously directed.  Call your regular medical doctor today to schedule a follow up appointment within the next 2 days.  Return to the Emergency Department immediately sooner if worsening.

## 2014-09-28 NOTE — ED Notes (Signed)
Pt made aware to return if symptoms worsen or if any life threatening symptoms occur.   

## 2014-09-28 NOTE — ED Provider Notes (Signed)
CSN: [id number]     Arrival date & time 09/28/14  1029 History   First MD Initiated Contact with Patient 09/28/14 1136     Chief Complaint  Patient presents with  . Sunburn  . Nausea  . Dizziness     HPI  Pt was seen at 1150. Per pt, c/o gradual onset and persistence of constant "dizziness" that began yesterday after she was "laying outside in the sun" for several hours. Pt also c/o nausea, generalized fatigue and sunburn. Denies vomiting/diarrhea, no CP/palpitations, no SOB/cough, no abd pain, no back pain, no fevers.    Past Medical History  Diagnosis Date  . Complication of anesthesia     Epidural 1 sided  . Headache(784.0)   . Depression     During 1 pregnancy  . Irregular periods   . Recurrent upper respiratory infection (URI)     Mostly when pregnant  . Diabetes mellitus without complication   . Gestational diabetes     just started checking BS 03/05/12  . Pregnancy induced hypertension   . Anemia   . Anxiety "fast hearbeat"    took Benadryl at end of pregnancy   Past Surgical History  Procedure Laterality Date  . Wisdom tooth extraction    . Multiple tooth extractions     Family History  Problem Relation Age of Onset  . Heart disease Maternal Grandmother   . Cancer Maternal Grandmother     brain  . Heart failure Maternal Grandmother   . Cancer Paternal Grandmother     brain  . Hypertension Mother   . Hypertension Father    History  Substance Use Topics  . Smoking status: Former Smoker -- .5 years    Types: Cigarettes    Quit date: 09/17/1998  . Smokeless tobacco: Former Neurosurgeon  . Alcohol Use: No     Comment: not while pregnant   OB History    Gravida Para Term Preterm AB TAB SAB Ectopic Multiple Living   6 5 5  0 1  1   5      Review of Systems ROS: Statement: All systems negative except as marked or noted in the HPI; Constitutional: Negative for fever and chills. +generalized fatigue, "dizziness.".; ; Eyes: Negative for eye pain, redness and  discharge. ; ; ENMT: Negative for ear pain, hoarseness, nasal congestion, sinus pressure and sore throat. ; ; Cardiovascular: Negative for chest pain, palpitations, diaphoresis, dyspnea and peripheral edema. ; ; Respiratory: Negative for cough, wheezing and stridor. ; ; Gastrointestinal: +nausea. Negative for vomiting, diarrhea, abdominal pain, blood in stool, hematemesis, jaundice and rectal bleeding. . ; ; Genitourinary: Negative for dysuria, flank pain and hematuria. ; ; Musculoskeletal: Negative for back pain and neck pain. Negative for swelling and trauma.; ; Skin: +sunburn. Negative for pruritus, abrasions, blisters, bruising and skin lesion.; ; Neuro: Negative for headache, lightheadedness and neck stiffness. Negative for altered level of consciousness , altered mental status, extremity weakness, paresthesias, involuntary movement, seizure and syncope.      Allergies  Review of patient's allergies indicates no known allergies.  Home Medications   Prior to Admission medications   Medication Sig Start Date End Date Taking? Authorizing Provider  cetirizine HCl (ZYRTEC) 5 MG/5ML SYRP Take 5 mLs (5 mg total) by mouth daily. 09/25/13  Yes Gilda Crease, MD  citalopram (CELEXA) 10 MG tablet Take 10 mg by mouth daily.   Yes Historical Provider, MD  fluticasone (FLONASE) 50 MCG/ACT nasal spray Place 1 spray into both nostrils daily. 09/25/13  Yes Gilda Crease, MD  albuterol (PROVENTIL HFA;VENTOLIN HFA) 108 (90 BASE) MCG/ACT inhaler Inhale 2 puffs into the lungs every 4 (four) hours as needed for wheezing or shortness of breath. Patient not taking: Reported on 09/28/2014 09/25/13   Gilda Crease, MD  HYDROcodone-homatropine [facility]) 5-1.5 MG/5ML syrup Take 5 mLs by mouth every 6 (six) hours as needed for cough. Patient not taking: Reported on 09/28/2014 09/25/13   Gilda Crease, MD   BP 113/69 mmHg  Pulse 103  Temp(Src) 98.5 F (36.9 C) (Oral)  Resp 18  Ht   (1.651 m)  Wt 170 lb (77.111 kg)  BMI 28.29 kg/m2  SpO2 100%  LMP 09/15/2014   11:40 Orthostatic Vital Signs AC  Orthostatic Lying  - BP- Lying: 113/69 mmHg ; Pulse- Lying: 107  Orthostatic Sitting - BP- Sitting: 117/79 mmHg ; Pulse- Sitting: 114  Orthostatic Standing at 0 minutes - BP- Standing at 0 minutes: 108/75 mmHg ; Pulse- Standing at 0 minutes: 139      Physical Exam  1155: Physical examination:  Nursing notes reviewed; Vital signs and O2 SAT reviewed;  Constitutional: Well developed, Well nourished, In no acute distress; Head:  Normocephalic, atraumatic; Eyes: EOMI, PERRL, No scleral icterus; ENMT: Mouth and pharynx normal, Mucous membranes dry; Neck: Supple, Full range of motion, No lymphadenopathy. No meningeal signs; Cardiovascular: Tachycardic rate and rhythm, No murmur, rub, or gallop; Respiratory: Breath sounds clear & equal bilaterally, No rales, rhonchi, wheezes.  Speaking full sentences with ease, Normal respiratory effort/excursion; Chest: Nontender, Movement normal; Abdomen: Soft, Nontender, Nondistended, Normal bowel sounds; Genitourinary: No CVA tenderness; Extremities: Pulses normal, No tenderness, No edema, No calf edema or asymmetry.; Neuro: AA&Ox3, Major CN grossly intact.  Speech clear. No gross focal motor or sensory deficits in extremities.; Skin: Color normal, Warm, Dry. +diffuse sunburn.   ED Course  Procedures     EKG Interpretation None      MDM  MDM Reviewed: previous chart, nursing note and vitals Reviewed previous: labs Interpretation: labs      Results for orders placed or performed during the hospital encounter of 09/28/14  Pregnancy, urine  Result Value Ref Range   Preg Test, Ur NEGATIVE NEGATIVE  Urinalysis, Routine w reflex microscopic (not at [facility])  Result Value Ref Range   Color, Urine YELLOW YELLOW   APPearance CLEAR CLEAR   Specific Gravity, Urine 1.025 1.005 - 1.030   pH 6.0 5.0 - 8.0   Glucose, UA NEGATIVE NEGATIVE mg/dL    Hgb urine dipstick NEGATIVE NEGATIVE   Bilirubin Urine NEGATIVE NEGATIVE   Ketones, ur NEGATIVE NEGATIVE mg/dL   Protein, ur NEGATIVE NEGATIVE mg/dL   Urobilinogen, UA 0.2 0.0 - 1.0 mg/dL   Nitrite NEGATIVE NEGATIVE   Leukocytes, UA TRACE (A) NEGATIVE  Comprehensive metabolic panel  Result Value Ref Range   Sodium 134 (L) 135 - 145 mmol/L   Potassium 3.8 3.5 - 5.1 mmol/L   Chloride 100 (L) 101 - 111 mmol/L   CO2 25 22 - 32 mmol/L   Glucose, Bld 127 (H) 65 - 99 mg/dL   BUN 11 6 - 20 mg/dL   Creatinine, Ser 1.61 0.44 - 1.00 mg/dL   Calcium 9.2 8.9 - 09.6 mg/dL   Total Protein 7.6 6.5 - 8.1 g/dL   Albumin 3.9 3.5 - 5.0 g/dL   AST 16 15 - 41 U/L   ALT 14 14 - 54 U/L   Alkaline Phosphatase 27 (L) 38 - 126 U/L   Total  Bilirubin 1.8 (H) 0.3 - 1.2 mg/dL   GFR calc non Af Amer >60 >60 mL/min   GFR calc Af Amer >60 >60 mL/min   Anion gap 9 5 - 15  Lipase, blood  Result Value Ref Range   Lipase 12 (L) 22 - 51 U/L  CBC with Differential  Result Value Ref Range   WBC 17.0 (H) 4.0 - 10.5 K/uL   RBC 5.17 (H) 3.87 - 5.11 MIL/uL   Hemoglobin 15.7 (H) 12.0 - 15.0 g/dL   HCT 56.2 (H) 13.0 - 86.5 %   MCV 89.2 78.0 - 100.0 fL   MCH 30.4 26.0 - 34.0 pg   MCHC 34.1 30.0 - 36.0 g/dL   RDW 78.4 69.6 - 29.5 %   Platelets 294 150 - 400 K/uL   Neutrophils Relative % 82 (H) 43 - 77 %   Neutro Abs 13.9 (H) 1.7 - 7.7 K/uL   Lymphocytes Relative 7 (L) 12 - 46 %   Lymphs Abs 1.2 0.7 - 4.0 K/uL   Monocytes Relative 11 3 - 12 %   Monocytes Absolute 1.9 (H) 0.1 - 1.0 K/uL   Eosinophils Relative 0 0 - 5 %   Eosinophils Absolute 0.0 0.0 - 0.7 K/uL   Basophils Relative 0 0 - 1 %   Basophils Absolute 0.0 0.0 - 0.1 K/uL  Urine microscopic-add on  Result Value Ref Range   Squamous Epithelial / LPF MANY (A) RARE   WBC, UA 3-6 <3 WBC/hpf   Bacteria, UA MANY (A) RARE    1420:  WBC, H/H elevated d/t hemeconcentration/dehydration. IVF boluses given. Pt has tol PO well while in the ED without N/V.  No  stooling while in the ED.  Abd remains benign. Pt has ambulated with steady gait, easy resps, NAD. Denies "dizziness." Pt states she feels better and wants to go home now. Udip contaminated; UC pending. Dx and testing d/w pt and family.  Questions answered.  Verb understanding, agreeable to d/c home with outpt f/u.     Samuel Jester, DO 10/01/14 Flossie Buffy

## 2014-09-30 LAB — URINE CULTURE

## 2015-01-25 ENCOUNTER — Telehealth: Payer: Self-pay | Admitting: *Deleted

## 2015-01-25 NOTE — Telephone Encounter (Signed)
Pt states she just found out she was pregnant about 6 weeks based on LMP noticed blood in her under garment but not when she wiped and c/o right sided pain and dizziness. Pt informed to continue to monitor bleeding if increases or right sided pain increases go to ER. Also to push fluids, eat frequent meals, and rest to help with dizziness. Pt has an appt 01/27/2015 with Cyril MourningJennifer Griffin, NP for pregnancy test. Pt verbalized understanding.

## 2015-01-27 ENCOUNTER — Ambulatory Visit (INDEPENDENT_AMBULATORY_CARE_PROVIDER_SITE_OTHER): Payer: Medicaid Other | Admitting: Adult Health

## 2015-01-27 ENCOUNTER — Encounter: Payer: Self-pay | Admitting: Adult Health

## 2015-01-27 VITALS — BP 120/80 | HR 106 | Ht 64.5 in | Wt 164.5 lb

## 2015-01-27 DIAGNOSIS — Z3201 Encounter for pregnancy test, result positive: Secondary | ICD-10-CM

## 2015-01-27 DIAGNOSIS — Z3490 Encounter for supervision of normal pregnancy, unspecified, unspecified trimester: Secondary | ICD-10-CM

## 2015-01-27 DIAGNOSIS — R110 Nausea: Secondary | ICD-10-CM | POA: Insufficient documentation

## 2015-01-27 DIAGNOSIS — O3680X0 Pregnancy with inconclusive fetal viability, not applicable or unspecified: Secondary | ICD-10-CM

## 2015-01-27 DIAGNOSIS — O26851 Spotting complicating pregnancy, first trimester: Secondary | ICD-10-CM | POA: Insufficient documentation

## 2015-01-27 HISTORY — DX: Encounter for supervision of normal pregnancy, unspecified, unspecified trimester: Z34.90

## 2015-01-27 HISTORY — DX: Nausea: R11.0

## 2015-01-27 HISTORY — DX: Spotting complicating pregnancy, first trimester: O26.851

## 2015-01-27 LAB — POCT URINE PREGNANCY: PREG TEST UR: POSITIVE — AB

## 2015-01-27 NOTE — Patient Instructions (Signed)
First Trimester of Pregnancy The first trimester of pregnancy is from week 1 until the end of week 12 (months 1 through 3). A week after a sperm fertilizes an egg, the egg will implant on the wall of the uterus. This embryo will begin to develop into a baby. Genes from you and your partner are forming the baby. The female genes determine whether the baby is a boy or a girl. At 6-8 weeks, the eyes and face are formed, and the heartbeat can be seen on ultrasound. At the end of 12 weeks, all the baby's organs are formed.  Now that you are pregnant, you will want to do everything you can to have a healthy baby. Two of the most important things are to get good prenatal care and to follow your health care provider's instructions. Prenatal care is all the medical care you receive before the baby's birth. This care will help prevent, find, and treat any problems during the pregnancy and childbirth. BODY CHANGES Your body goes through many changes during pregnancy. The changes vary from woman to woman.   You may gain or lose a couple of pounds at first.  You may feel sick to your stomach (nauseous) and throw up (vomit). If the vomiting is uncontrollable, call your health care provider.  You may tire easily.  You may develop headaches that can be relieved by medicines approved by your health care provider.  You may urinate more often. Painful urination may mean you have a bladder infection.  You may develop heartburn as a result of your pregnancy.  You may develop constipation because certain hormones are causing the muscles that push waste through your intestines to slow down.  You may develop hemorrhoids or swollen, bulging veins (varicose veins).  Your breasts may begin to grow larger and become tender. Your nipples may stick out more, and the tissue that surrounds them (areola) may become darker.  Your gums may bleed and may be sensitive to brushing and flossing.  Dark spots or blotches (chloasma,  mask of pregnancy) may develop on your face. This will likely fade after the baby is born.  Your menstrual periods will stop.  You may have a loss of appetite.  You may develop cravings for certain kinds of food.  You may have changes in your emotions from day to day, such as being excited to be pregnant or being concerned that something may go wrong with the pregnancy and baby.  You may have more vivid and strange dreams.  You may have changes in your hair. These can include thickening of your hair, rapid growth, and changes in texture. Some women also have hair loss during or after pregnancy, or hair that feels dry or thin. Your hair will most likely return to normal after your baby is born. WHAT TO EXPECT AT YOUR PRENATAL VISITS During a routine prenatal visit:  You will be weighed to make sure you and the baby are growing normally.  Your blood pressure will be taken.  Your abdomen will be measured to track your baby's growth.  The fetal heartbeat will be listened to starting around week 10 or 12 of your pregnancy.  Test results from any previous visits will be discussed. Your health care provider may ask you:  How you are feeling.  If you are feeling the baby move.  If you have had any abnormal symptoms, such as leaking fluid, bleeding, severe headaches, or abdominal cramping.  If you are using any tobacco products,   including cigarettes, chewing tobacco, and electronic cigarettes.  If you have any questions. Other tests that may be performed during your first trimester include:  Blood tests to find your blood type and to check for the presence of any previous infections. They will also be used to check for low iron levels (anemia) and Rh antibodies. Later in the pregnancy, blood tests for diabetes will be done along with other tests if problems develop.  Urine tests to check for infections, diabetes, or protein in the urine.  An ultrasound to confirm the proper growth  and development of the baby.  An amniocentesis to check for possible genetic problems.  Fetal screens for spina bifida and Down syndrome.  You may need other tests to make sure you and the baby are doing well.  HIV (human immunodeficiency virus) testing. Routine prenatal testing includes screening for HIV, unless you choose not to have this test. HOME CARE INSTRUCTIONS  Medicines  Follow your health care provider's instructions regarding medicine use. Specific medicines may be either safe or unsafe to take during pregnancy.  Take your prenatal vitamins as directed.  If you develop constipation, try taking a stool softener if your health care provider approves. Diet  Eat regular, well-balanced meals. Choose a variety of foods, such as meat or vegetable-based protein, fish, milk and low-fat dairy products, vegetables, fruits, and whole grain breads and cereals. Your health care provider will help you determine the amount of weight gain that is right for you.  Avoid raw meat and uncooked cheese. These carry germs that can cause birth defects in the baby.  Eating four or five small meals rather than three large meals a day may help relieve nausea and vomiting. If you start to feel nauseous, eating a few soda crackers can be helpful. Drinking liquids between meals instead of during meals also seems to help nausea and vomiting.  If you develop constipation, eat more high-fiber foods, such as fresh vegetables or fruit and whole grains. Drink enough fluids to keep your urine clear or pale yellow. Activity and Exercise  Exercise only as directed by your health care provider. Exercising will help you:  Control your weight.  Stay in shape.  Be prepared for labor and delivery.  Experiencing pain or cramping in the lower abdomen or low back is a good sign that you should stop exercising. Check with your health care provider before continuing normal exercises.  Try to avoid standing for long  periods of time. Move your legs often if you must stand in one place for a long time.  Avoid heavy lifting.  Wear low-heeled shoes, and practice good posture.  You may continue to have sex unless your health care provider directs you otherwise. Relief of Pain or Discomfort  Wear a good support bra for breast tenderness.   Take warm sitz baths to soothe any pain or discomfort caused by hemorrhoids. Use hemorrhoid cream if your health care provider approves.   Rest with your legs elevated if you have leg cramps or low back pain.  If you develop varicose veins in your legs, wear support hose. Elevate your feet for 15 minutes, 3-4 times a day. Limit salt in your diet. Prenatal Care  Schedule your prenatal visits by the twelfth week of pregnancy. They are usually scheduled monthly at first, then more often in the last 2 months before delivery.  Write down your questions. Take them to your prenatal visits.  Keep all your prenatal visits as directed by your   health care provider. Safety  Wear your seat belt at all times when driving.  Make a list of emergency phone numbers, including numbers for family, friends, the hospital, and police and fire departments. General Tips  Ask your health care provider for a referral to a local prenatal education class. Begin classes no later than at the beginning of month 6 of your pregnancy.  Ask for help if you have counseling or nutritional needs during pregnancy. Your health care provider can offer advice or refer you to specialists for help with various needs.  Do not use hot tubs, steam rooms, or saunas.  Do not douche or use tampons or scented sanitary pads.  Do not cross your legs for long periods of time.  Avoid cat litter boxes and soil used by cats. These carry germs that can cause birth defects in the baby and possibly loss of the fetus by miscarriage or stillbirth.  Avoid all smoking, herbs, alcohol, and medicines not prescribed by  your health care provider. Chemicals in these affect the formation and growth of the baby.  Do not use any tobacco products, including cigarettes, chewing tobacco, and electronic cigarettes. If you need help quitting, ask your health care provider. You may receive counseling support and other resources to help you quit.  Schedule a dentist appointment. At home, brush your teeth with a soft toothbrush and be gentle when you floss. SEEK MEDICAL CARE IF:   You have dizziness.  You have mild pelvic cramps, pelvic pressure, or nagging pain in the abdominal area.  You have persistent nausea, vomiting, or diarrhea.  You have a bad smelling vaginal discharge.  You have pain with urination.  You notice increased swelling in your face, hands, legs, or ankles. SEEK IMMEDIATE MEDICAL CARE IF:   You have a fever.  You are leaking fluid from your vagina.  You have spotting or bleeding from your vagina.  You have severe abdominal cramping or pain.  You have rapid weight gain or loss.  You vomit blood or material that looks like coffee grounds.  You are exposed to [place] measles and have never had them.  You are exposed to fifth disease or chickenpox.  You develop a severe headache.  You have shortness of breath.  You have any kind of trauma, such as from a fall or a car accident.   This information is not intended to replace advice given to you by your health care provider. Make sure you discuss any questions you have with your health care provider.   Document Released: 03/20/2001 Document Revised: 04/16/2014 Document Reviewed: 02/03/2013 Elsevier Interactive Patient Education 2016 Elsevier Inc. Hemorragia vaginal durante Firefighterel embarazo (primer trimestre) (Vaginal Bleeding During Pregnancy, First Trimester) Durante los primeros meses de [place], es comn tener una pequea hemorragia vaginal (manchas). A veces, la hemorragia es normal y no representa un problema, pero en algunas  ocasiones es un sntoma de algo grave. Asegrese de decirle a su mdico de inmediato si tiene algn tipo de hemorragia vaginal. CUIDADOS EN EL HOGAR  Controle su afeccin para ver si hay cambios.  Siga las indicaciones de su mdico con respecto al Roslyngrado de actividad que [place]uede tener.  Si debe hacer reposo en cama:  Es posible que deba quedarse en cama y levantarse nicamente para ir al bao.  Quizs le permitan hacer The PNC Financialalgunas actividades.  Si es necesario, planifique que alguien la ayude.  Marcelino FreestoneEscriba:  La cantidad de toallas higinicas que [place]a cada da.  La frecuencia con la  que se cambia las toallas higinicas.  Indique que tan empapados (saturados) estn.  No use tampones.  No se haga duchas vaginales.  No tenga relaciones sexuales ni orgasmos hasta que el mdico la autorice.  Si elimina tejido por la vagina, gurdelo para mostrrselo al [employer].  Tome los medicamentos solamente como se lo haya indicado el mdico.  No tome aspirina, ya que puede causar hemorragias.  Concurra a todas las visitas de control como se lo haya indicado el mdico. SOLICITE AYUDA SI:   Tiene una hemorragia vaginal.  Tiene clicos.  Tiene dolores de [place].  Tiene fiebre que no desaparece despus de Teacher, adult education. SOLICITE AYUDA DE INMEDIATO SI:   Siente clicos muy intensos en la espalda o en el vientre (abdomen).  Elimina cogulos grandes o tejido por la vagina.  Tiene ms hemorragia.  Se siente dbil o que va a desvanecerse.  Pierde el conocimiento (se desmaya).  Tiene escalofros.  Tiene una prdida importante o sale lquido a borbotones por la vagina.  Se desmaya mientras defeca. ASEGRESE DE QUE:  Comprende estas instrucciones.  Controlar su afeccin.  Recibir ayuda de inmediato si no mejora o si empeora.   Esta informacin no tiene Theme park manager el consejo del mdico. Asegrese de hacerle al mdico cualquier pregunta que tenga.   Document Released:  08/10/2013 Elsevier Interactive Patient Education [employer] No sex no lifting Return in 5 days for [place]

## 2015-01-27 NOTE — Progress Notes (Signed)
Subjective:     Patient ID: [name] C Branch, female   DOB: [birth year], 34 y.o.   MRN: [id number]  HPI [name] is a 34 year old white female, G7P5 in for UPT, had + HPT and has spotted some and stopped celexa but thinks she needs it, has new partner, husband left after last child.She has some nausea and dizziness at times.  Review of Systems Patient denies any headaches, hearing loss, fatigue, blurred vision, shortness of breath, chest pain, abdominal pain, problems with bowel movements, or intercourse. No joint pain or mood swings. See HPI for positives. Reviewed past medical,surgical, social and family history. Reviewed medications and allergies.     Objective:   Physical Exam BP 120/80 mmHg  Pulse 106  Ht 5' 4.5" (1.638 m)  Wt 164 lb 8 oz (74.617 kg)  BMI 27.81 kg/m2  LMP 12/15/2014  Breastfeeding? No UPT +, about 6+1 week by LMP EDD 09/21/15, Skin warm and dry.Lungs: clear to ausculation bilaterally. Cardiovascular: regular rate and rhythm.Abdomen non tender, had brown spotting this morning,no pain.    Assessment:     Pregnant Nausea Spotting in first trimester     Plan:    Take flintstones 2 daily  Eat often and push fluids Ok to take celexa Return in 5 days for dating [place] No sex or heavy lifting Review handout on first tirmester

## 2015-01-30 ENCOUNTER — Inpatient Hospital Stay (HOSPITAL_COMMUNITY): Payer: Medicaid Other

## 2015-01-30 ENCOUNTER — Encounter (HOSPITAL_COMMUNITY): Payer: Self-pay | Admitting: *Deleted

## 2015-01-30 ENCOUNTER — Inpatient Hospital Stay (HOSPITAL_COMMUNITY)
Admission: AD | Admit: 2015-01-30 | Discharge: 2015-01-30 | Disposition: A | Discharge status: Home / Self Care | Payer: Medicaid Other | Source: Ambulatory Visit | Attending: Obstetrics and Gynecology | Admitting: Obstetrics and Gynecology

## 2015-01-30 DIAGNOSIS — R350 Frequency of micturition: Secondary | ICD-10-CM | POA: Insufficient documentation

## 2015-01-30 DIAGNOSIS — Z3A01 Less than 8 weeks gestation of pregnancy: Secondary | ICD-10-CM | POA: Diagnosis not present

## 2015-01-30 DIAGNOSIS — O4691 Antepartum hemorrhage, unspecified, first trimester: Secondary | ICD-10-CM

## 2015-01-30 DIAGNOSIS — O209 Hemorrhage in early pregnancy, unspecified: Secondary | ICD-10-CM | POA: Diagnosis not present

## 2015-01-30 DIAGNOSIS — Z87891 Personal history of nicotine dependence: Secondary | ICD-10-CM | POA: Insufficient documentation

## 2015-01-30 DIAGNOSIS — Z79899 Other long term (current) drug therapy: Secondary | ICD-10-CM | POA: Diagnosis not present

## 2015-01-30 DIAGNOSIS — R1031 Right lower quadrant pain: Secondary | ICD-10-CM | POA: Insufficient documentation

## 2015-01-30 DIAGNOSIS — O3680X0 Pregnancy with inconclusive fetal viability, not applicable or unspecified: Secondary | ICD-10-CM

## 2015-01-30 DIAGNOSIS — O360111 Maternal care for anti-D [Rh] antibodies, first trimester, fetus 1: Secondary | ICD-10-CM

## 2015-01-30 LAB — URINE MICROSCOPIC-ADD ON

## 2015-01-30 LAB — URINALYSIS, ROUTINE W REFLEX MICROSCOPIC
BILIRUBIN URINE: NEGATIVE
Glucose, UA: NEGATIVE mg/dL
KETONES UR: NEGATIVE mg/dL
Leukocytes, UA: NEGATIVE
NITRITE: NEGATIVE
PROTEIN: NEGATIVE mg/dL
Specific Gravity, Urine: 1.02 (ref 1.005–1.030)
UROBILINOGEN UA: 0.2 mg/dL (ref 0.0–1.0)
pH: 7 (ref 5.0–8.0)

## 2015-01-30 LAB — CBC
HCT: 36.8 % (ref 36.0–46.0)
Hemoglobin: 12.1 g/dL (ref 12.0–15.0)
MCH: 29.2 pg (ref 26.0–34.0)
MCHC: 32.9 g/dL (ref 30.0–36.0)
MCV: 88.7 fL (ref 78.0–100.0)
PLATELETS: 323 10*3/uL (ref 150–400)
RBC: 4.15 MIL/uL (ref 3.87–5.11)
RDW: 13.6 % (ref 11.5–15.5)
WBC: 6.4 10*3/uL (ref 4.0–10.5)

## 2015-01-30 LAB — WET PREP, GENITAL
Clue Cells Wet Prep HPF POC: NONE SEEN
Trich, Wet Prep: NONE SEEN
Yeast Wet Prep HPF POC: NONE SEEN

## 2015-01-30 LAB — HCG, QUANTITATIVE, PREGNANCY: hCG, Beta Chain, Quant, S: 143 m[IU]/mL — ABNORMAL HIGH (ref <5)

## 2015-01-30 MED ORDER — RHO D IMMUNE GLOBULIN 1500 UNIT/2ML IJ SOSY
300.0000 ug | PREFILLED_SYRINGE | Freq: Once | INTRAMUSCULAR | Status: AC
  Administered 2015-01-30: 300 ug via INTRAMUSCULAR
  Filled 2015-01-30: qty 2

## 2015-01-30 NOTE — MAU Note (Signed)
Patient presents at [redacted] weeks gestation with c/o vaginal bleeding since Wednesday that is now brownish in color. Also c/o right flank pain today. Denies discharge.

## 2015-01-30 NOTE — MAU Provider Note (Signed)
Chief Complaint: Vaginal Bleeding and Flank Pain   First Provider Initiated Contact with Patient 01/30/15 1805      SUBJECTIVE HPI: [name] is a 34 y.o. [id number] at [redacted]w[redacted]d by LMP who presents to maternity admissions reporting RLQ abdominal pain radiating to right flank, urinary frequency and urgency, and vaginal spotting x 4 days. She was seen on 10/20 at [facility] for pregnancy test and labs and reported her spotting at that time.   She denies vaginal itching/burning, h/a, dizziness, n/v, or fever/chills.     Vaginal Bleeding The patient's primary symptoms include vaginal bleeding. The patient's pertinent negatives include no pelvic pain or vaginal discharge. This is a new problem. The current episode started in the past 7 days. The problem occurs constantly. The problem has been unchanged. The pain is moderate. The problem affects the right side. She is pregnant. Associated symptoms include frequency, nausea and urgency. Pertinent negatives include no chills, constipation, diarrhea, dysuria, fever, flank pain, headaches or vomiting. The vaginal bleeding is spotting. She has not been passing clots. She has not been passing tissue. Nothing aggravates the symptoms. She has tried nothing for the symptoms.    Past Medical History  Diagnosis Date  . Complication of anesthesia     Epidural 1 sided  . Headache(784.0)   . Depression     During 1 pregnancy  . Irregular periods   . Recurrent upper respiratory infection (URI)     Mostly when pregnant  . Diabetes mellitus without complication (HCC)   . Gestational diabetes     just started checking BS 03/05/12  . Pregnancy induced hypertension   . Anemia   . Anxiety "fast hearbeat"    took Benadryl at end of pregnancy  . Pregnant 01/27/2015  . Nausea 01/27/2015  . Spotting affecting pregnancy in first trimester 01/27/2015   Past Surgical History  Procedure Laterality Date  . Wisdom tooth extraction    . Multiple tooth  extractions     Social History   Social History  . Marital Status: Legally Separated    Spouse Name: N/A  . Number of Children: N/A  . Years of Education: N/A   Occupational History  . Not on file.   Social History Main Topics  . Smoking status: Former Smoker -- .5 years    Types: Cigarettes    Quit date: 09/17/1998  . Smokeless tobacco: Former Neurosurgeon  . Alcohol Use: No     Comment: not while pregnant  . Drug Use: No  . Sexual Activity: Yes    Birth Control/ Protection: None   Other Topics Concern  . Not on file   Social History Narrative   No current facility-administered medications on file prior to encounter.   Current Outpatient Prescriptions on File Prior to Encounter  Medication Sig Dispense Refill  . citalopram (CELEXA) 10 MG tablet Take 10 mg by mouth at bedtime.      No Known Allergies  ROS:  Review of Systems  Constitutional: Negative for fever, chills and fatigue.  HENT: Negative for sinus pressure.   Eyes: Negative for photophobia.  Respiratory: Negative for shortness of breath.   Cardiovascular: Negative for chest pain.  Gastrointestinal: Positive for nausea. Negative for vomiting, diarrhea and constipation.  Genitourinary: Positive for urgency, frequency and vaginal bleeding. Negative for dysuria, flank pain, vaginal discharge, difficulty urinating, vaginal pain and pelvic pain.  Musculoskeletal: Negative for neck pain.  Neurological: Negative for dizziness, weakness and headaches.  Psychiatric/Behavioral: Negative.  I have reviewed patient's Past Medical Hx, Surgical Hx, Family Hx, Social Hx, medications and allergies.   Physical Exam   Patient Vitals for the past 24 hrs:  BP Temp Temp src Pulse Resp Height Weight  01/30/15 1719 139/86 mmHg 98.1 F (36.7 C) Oral 98 16 5' 4.5" (1.638 m) 74.617 kg (164 lb 8 oz)   Constitutional: Well-developed, well-nourished female in no acute distress.  Cardiovascular: normal rate Respiratory: normal  effort GI: Abd soft, mild tenderness in RLQ, no rebound tenderness or guarding. Pos BS x 4 MS: Extremities nontender, no edema, normal ROM Neurologic: Alert and oriented x 4.  GU: Neg CVAT.  PELVIC EXAM: Cervix pink, visually closed, without lesion, small amount dark brown bleeding, vaginal walls and external genitalia normal Bimanual exam: Cervix 0/long/high, firm, anterior, neg CMT, uterus nontender, nonenlarged, adnexa without tenderness, enlargement, or mass  LAB RESULTS Results for orders placed or performed during the hospital encounter of 01/30/15 (from the past 24 hour(s))  Urinalysis, Routine w reflex microscopic (not at [facility])     Status: Abnormal   Collection Time: 01/30/15  5:10 PM  Result Value Ref Range   Color, Urine YELLOW YELLOW   APPearance CLEAR CLEAR   Specific Gravity, Urine 1.020 1.005 - 1.030   pH 7.0 5.0 - 8.0   Glucose, UA NEGATIVE NEGATIVE mg/dL   Hgb urine dipstick SMALL (A) NEGATIVE   Bilirubin Urine NEGATIVE NEGATIVE   Ketones, ur NEGATIVE NEGATIVE mg/dL   Protein, ur NEGATIVE NEGATIVE mg/dL   Urobilinogen, UA 0.2 0.0 - 1.0 mg/dL   Nitrite NEGATIVE NEGATIVE   Leukocytes, UA NEGATIVE NEGATIVE  Urine microscopic-add on     Status: Abnormal   Collection Time: 01/30/15  5:10 PM  Result Value Ref Range   Squamous Epithelial / LPF FEW (A) RARE   WBC, UA 0-2 <3 WBC/hpf   RBC / HPF 0-2 <3 RBC/hpf   Bacteria, UA FEW (A) RARE  Wet prep, genital     Status: Abnormal   Collection Time: 01/30/15  5:49 PM  Result Value Ref Range   Yeast Wet Prep HPF POC NONE SEEN NONE SEEN   Trich, Wet Prep NONE SEEN NONE SEEN   Clue Cells Wet Prep HPF POC NONE SEEN NONE SEEN   WBC, Wet Prep HPF POC MANY (A) NONE SEEN  CBC     Status: None   Collection Time: 01/30/15  6:10 PM  Result Value Ref Range   WBC 6.4 4.0 - 10.5 K/uL   RBC 4.15 3.87 - 5.11 MIL/uL   Hemoglobin 12.1 12.0 - 15.0 g/dL   HCT 16.1 09.6 - 04.5 %   MCV 88.7 78.0 - 100.0 fL   MCH 29.2 26.0 - 34.0 pg    MCHC 32.9 30.0 - 36.0 g/dL   RDW 40.9 81.1 - 91.4 %   Platelets 323 150 - 400 K/uL  hCG, quantitative, pregnancy     Status: Abnormal   Collection Time: 01/30/15  6:10 PM  Result Value Ref Range   hCG, Beta Chain, Quant, S 143 (H) <5 mIU/mL  Rh IG workup (includes ABO/Rh)     Status: None (Preliminary result)   Collection Time: 01/30/15  6:10 PM  Result Value Ref Range   Gestational Age(Wks) 6    ABO/RH(D) A NEG    Antibody Screen NEG    Unit Number [id number]    Blood Component Type RHIG    Unit division 00    Status of Unit ALLOCATED    Transfusion  Status OK TO TRANSFUSE     --/--/A NEG (10/23 1810)  IMAGING [place] Ob Comp Less 14 Wks  01/30/2015  CLINICAL DATA:  Vaginal bleeding for 5 days EXAM: OBSTETRIC <14 WK US AND TRANSVAGINAL OB US TECHNIQUE: Both transabdominal and transvaginal ultrasound examinations were performed for complete evaluation of the gestation as well as the maternal uterus, adnexal regions, and pelvic cul-de-sac. Transvaginal technique was performed to assess early pregnancy. COMPARISON:  None. FINDINGS: Intrauterine gestational sac: Not visualized Maternal uterus/adnexae: The ovaries are within normal limits bilaterally. Mild decidual reaction is noted within the uterus although no gestational sac is seen. No free fluid is noted. IMPRESSION: No evidence of intrauterine or extrauterine gestational sac is noted. Electronically Signed   By: Alcide CleverMark  Lukens M.D.   On: 01/30/2015 19:23   [place] Ob Transvaginal  01/30/2015  CLINICAL DATA:  Vaginal bleeding for 5 days EXAM: OBSTETRIC <14 WK US AND TRANSVAGINAL OB US TECHNIQUE: Both transabdominal and transvaginal ultrasound examinations were performed for complete evaluation of the gestation as well as the maternal uterus, adnexal regions, and pelvic cul-de-sac. Transvaginal technique was performed to assess early pregnancy. COMPARISON:  None. FINDINGS: Intrauterine gestational sac: Not visualized Maternal uterus/adnexae: The  ovaries are within normal limits bilaterally. Mild decidual reaction is noted within the uterus although no gestational sac is seen. No free fluid is noted. IMPRESSION: No evidence of intrauterine or extrauterine gestational sac is noted. Electronically Signed   By: Alcide CleverMark  Lukens M.D.   On: 01/30/2015 19:23    MAU Management/MDM: Ordered labs and imaging and reviewed results. Findings today could represent a normal early pregnancy, spontaneous abortion or ectopic pregnancy which can be life-threatening.  Ectopic precautions were given to the patient with plan to return in 48 hours for repeat quant hcg to evaluate pregnancy development.  Treatments in MAU included Rhophylac dose given IM x 1.  Pt stable at time of discharge.  ASSESSMENT 1. Pregnancy of unknown anatomic location   2. Vaginal bleeding in pregnancy, first trimester     PLAN Discharge home with ectopic precautions F/U in MAU in 48 hours for repeat hcg or sooner as needed for emergencies    Medication List    TAKE these medications        citalopram 10 MG tablet  Commonly known as:  CELEXA  Take 10 mg by mouth at bedtime.     prenatal multivitamin Tabs tablet  Take 1 tablet by mouth daily.       Follow-up Information    [facility]   Why:  In 48 hours for repeat labs or sooner as needed   Contact information:   [address] [id number] [place] [id number] [phone]      Sharen CounterLisa Leftwich-Kirby Certified Nurse-Midwife 01/30/2015  7:26 PM

## 2015-01-30 NOTE — Discharge Instructions (Signed)
Vaginal Bleeding During Pregnancy, First Trimester A small amount of bleeding (spotting) from the vagina is relatively common in early pregnancy. It usually stops on its own. Various things may cause bleeding or spotting in early pregnancy. Some bleeding may be related to the pregnancy, and some may not. In most cases, the bleeding is normal and is not a problem. However, bleeding can also be a sign of something serious. Be sure to tell your health care provider about any vaginal bleeding right away. Some possible causes of vaginal bleeding during the first trimester include:  Infection or inflammation of the cervix.  Growths (polyps) on the cervix.  Miscarriage or threatened miscarriage.  Pregnancy tissue has developed outside of the uterus and in a fallopian tube (tubal pregnancy).  Tiny cysts have developed in the uterus instead of pregnancy tissue (molar pregnancy). HOME CARE INSTRUCTIONS  Watch your condition for any changes. The following actions may help to lessen any discomfort you are feeling:  Follow your health care provider's instructions for limiting your activity. If your health care provider orders bed rest, you may need to stay in bed and only get up to use the bathroom. However, your health care provider may allow you to continue light activity.  If needed, make plans for someone to help with your regular activities and responsibilities while you are on bed rest.  Keep track of the number of pads you use each day, how often you change pads, and how soaked (saturated) they are. Write this down.  Do not use tampons. Do not douche.  Do not have sexual intercourse or orgasms until approved by your health care provider.  If you pass any tissue from your vagina, save the tissue so you can show it to your health care provider.  Only take over-the-counter or prescription medicines as directed by your health care provider.  Do not take aspirin because it can make you  bleed.  Keep all follow-up appointments as directed by your health care provider. SEEK MEDICAL CARE IF:  You have any vaginal bleeding during any part of your pregnancy.  You have cramps or labor pains.  You have a fever, not controlled by medicine. SEEK IMMEDIATE MEDICAL CARE IF:   You have severe cramps in your back or belly (abdomen).  You pass large clots or tissue from your vagina.  Your bleeding increases.  You feel light-headed or weak, or you have fainting episodes.  You have chills.  You are leaking fluid or have a gush of fluid from your vagina.  You pass out while having a bowel movement. MAKE SURE YOU:  Understand these instructions.  Will watch your condition.  Will get help right away if you are not doing well or get worse.   This information is not intended to replace advice given to you by your health care provider. Make sure you discuss any questions you have with your health care provider.   Document Released: 01/03/2005 Document Revised: 03/31/2013 Document Reviewed: 12/01/2012 Elsevier Interactive Patient Education 2016 Elsevier Inc. Abdominal Pain During Pregnancy Abdominal pain is common in pregnancy. Most of the time, it does not cause harm. There are many causes of abdominal pain. Some causes are more serious than others. Some of the causes of abdominal pain in pregnancy are easily diagnosed. Occasionally, the diagnosis takes time to understand. Other times, the cause is not determined. Abdominal pain can be a sign that something is very wrong with the pregnancy, or the pain may have nothing to do  with the pregnancy at all. For this reason, always tell your health care provider if you have any abdominal discomfort. HOME CARE INSTRUCTIONS  Monitor your abdominal pain for any changes. The following actions may help to alleviate any discomfort you are experiencing:  Do not have sexual intercourse or put anything in your vagina until your symptoms go  away completely.  Get plenty of rest until your pain improves.  Drink clear fluids if you feel nauseous. Avoid solid food as long as you are uncomfortable or nauseous.  Only take over-the-counter or prescription medicine as directed by your health care provider.  Keep all follow-up appointments with your health care provider. SEEK IMMEDIATE MEDICAL CARE IF:  You are bleeding, leaking fluid, or passing tissue from the vagina.  You have increasing pain or cramping.  You have persistent vomiting.  You have painful or bloody urination.  You have a fever.  You notice a decrease in your baby's movements.  You have extreme weakness or feel faint.  You have shortness of breath, with or without abdominal pain.  You develop a severe headache with abdominal pain.  You have abnormal vaginal discharge with abdominal pain.  You have persistent diarrhea.  You have abdominal pain that continues even after rest, or gets worse. MAKE SURE YOU:   Understand these instructions.  Will watch your condition.  Will get help right away if you are not doing well or get worse.   This information is not intended to replace advice given to you by your health care provider. Make sure you discuss any questions you have with your health care provider.   Document Released: 03/26/2005 Document Revised: 01/14/2013 Document Reviewed: 10/23/2012 Elsevier Interactive Patient Education [employer] Elsevier Inc.

## 2015-01-31 LAB — GC/CHLAMYDIA PROBE AMP (~~LOC~~) NOT AT ARMC
Chlamydia: NEGATIVE
Neisseria Gonorrhea: NEGATIVE

## 2015-01-31 LAB — RH IG WORKUP (INCLUDES ABO/RH)
ABO/RH(D): A NEG
ANTIBODY SCREEN: NEGATIVE
GESTATIONAL AGE(WKS): 6
UNIT DIVISION: 0

## 2015-01-31 LAB — HIV ANTIBODY (ROUTINE TESTING W REFLEX): HIV SCREEN 4TH GENERATION: NONREACTIVE

## 2015-02-01 ENCOUNTER — Inpatient Hospital Stay (HOSPITAL_COMMUNITY)
Admission: AD | Admit: 2015-02-01 | Discharge: 2015-02-01 | Disposition: A | Discharge status: Home / Self Care | Payer: Medicaid Other | Source: Ambulatory Visit | Attending: Obstetrics & Gynecology | Admitting: Obstetrics & Gynecology

## 2015-02-01 DIAGNOSIS — O209 Hemorrhage in early pregnancy, unspecified: Secondary | ICD-10-CM | POA: Diagnosis present

## 2015-02-01 DIAGNOSIS — O020 Blighted ovum and nonhydatidiform mole: Secondary | ICD-10-CM | POA: Insufficient documentation

## 2015-02-01 LAB — HCG, QUANTITATIVE, PREGNANCY: HCG, BETA CHAIN, QUANT, S: 65 m[IU]/mL — AB (ref <5)

## 2015-02-01 NOTE — MAU Provider Note (Signed)
History     CSN: [id number]  Arrival date and time: 02/01/15 1828   None     Chief Complaint  Patient presents with  . Follow-up   HPI  Pt is [id number] here for repeat HCG after being seen on 10/23 with HCG 143 and US showing no intrauterine or extrauterine pregnancy. Pt is having some pain and has passed 2 clots- small amount.  Pain is tolerable RN note:      Expand All Collapse All   PT SAYS WAS TOLD TO RETURN FOR LABS. WAS HERE ON Sunday - WAS TOLD LABS AND U/S - TO RECHECK HORMONE LEVEL . ON Sunday - BLEEDING WAS BROWN WITH PINK AND RED SPOTS WHEN SHE WIPED . TODAY IS RED - STARTED AT 0600 - - WHEN SHE WIPED . HAS BEEN BLEEDING ALL DAY . IN TRIAGE - PAD - SMALL AMT LIGHT BROWN. PAIN ON Sunday- RIGHT SIDE AND BACK- 7. NO MEDS FOR PAIN. NOW PAIN IS 6 - NO MEDS TODAY .           Past Medical History  Diagnosis Date  . Complication of anesthesia     Epidural 1 sided  . Headache(784.0)   . Depression     During 1 pregnancy  . Irregular periods   . Recurrent upper respiratory infection (URI)     Mostly when pregnant  . Diabetes mellitus without complication (HCC)   . Gestational diabetes     just started checking BS 03/05/12  . Pregnancy induced hypertension   . Anemia   . Anxiety "fast hearbeat"    took Benadryl at end of pregnancy  . Pregnant 01/27/2015  . Nausea 01/27/2015  . Spotting affecting pregnancy in first trimester 01/27/2015    Past Surgical History  Procedure Laterality Date  . Wisdom tooth extraction    . Multiple tooth extractions      Family History  Problem Relation Age of Onset  . Heart disease Maternal Grandmother   . Cancer Maternal Grandmother     brain  . Heart failure Maternal Grandmother   . Cancer Paternal Grandmother     brain  . Hypertension Mother   . Anxiety disorder Mother   . Depression Mother   . Hypertension Father   . Anxiety disorder Sister   . ADD /  ADHD Daughter   . Asthma Daughter   . ADD / ADHD Daughter   . Seizures Daughter   . ADD / ADHD Daughter   . ADD / ADHD Daughter     Social History  Substance Use Topics  . Smoking status: Former Smoker -- .5 years    Types: Cigarettes    Quit date: 09/17/1998  . Smokeless tobacco: Former Neurosurgeon  . Alcohol Use: No     Comment: not while pregnant    Allergies: No Known Allergies  Prescriptions prior to admission  Medication Sig Dispense Refill Last Dose  . citalopram (CELEXA) 10 MG tablet Take 10 mg by mouth at bedtime.    01/29/2015 at Unknown time  . Prenatal Vit-Fe Fumarate-FA (PRENATAL MULTIVITAMIN) TABS tablet Take 1 tablet by mouth daily.   01/30/2015 at Unknown time    Review of Systems  Constitutional: Negative for fever and chills.  Gastrointestinal: Positive for abdominal pain. Negative for nausea and vomiting.  Genitourinary: Negative for dysuria.   Physical Exam   Last menstrual period 12/15/2014.  Physical Exam  Nursing note and vitals reviewed. Constitutional: She is oriented to person, place, and time. She appears  well-developed and well-nourished. No distress.  HENT:  Head: Normocephalic.  Eyes: Pupils are equal, round, and reactive to light.  Neck: Normal range of motion. Neck supple.  Cardiovascular: Normal rate.   Respiratory: Effort normal.  GI: Soft.  Musculoskeletal: Normal range of motion.  Neurological: She is alert and oriented to person, place, and time.  Skin: Skin is warm and dry.  Psychiatric: She has a normal mood and affect.    MAU Course  Procedures Results for orders placed or performed during the hospital encounter of 02/01/15 (from the past 24 hour(s))  hCG, quantitative, pregnancy     Status: Abnormal   Collection Time: 02/01/15  6:32 PM  Result Value Ref Range   hCG, Beta Chain, Quant, S 65 (H) <5 mIU/mL  Results for Lucretia FieldBRANCH, Infantof C (MRN [id number]) as of 02/01/2015 21:06  Ref. Range 01/30/2015 17:10 01/30/2015 17:49  01/30/2015 18:10 01/30/2015 19:14 02/01/2015 18:32  HCG, Beta Chain, Quant, S Latest Ref Range: <5 mIU/mL   143 (H)  65 (H)    Assessment and Plan  Blighted ovum- [facility]  H[place]le [place]burn 02/01/2015, 7:25 PM

## 2015-02-01 NOTE — Discharge Instructions (Signed)
°  Blighted Ovum °A blighted ovum is a common kind of early pregnancy failure. It happens when a fertilized egg attaches to the uterus but stops growing. Even though the egg never develops, the body acts like it is pregnant. A sac starts to form around the egg, and tissue to support a baby starts to form in the placenta. °CAUSES °This condition is usually caused by a genetic defect in the egg. °SYMPTOMS °Early symptoms of this condition are the same as those of early pregnancy. They include: °· A missed menstrual period. °· Fatigue. °· Feeling sick to your stomach (nauseous). °· Sore breasts. °Later symptoms are those of pregnancy loss. They include: °· Abdominal cramps. °· Vaginal bleeding or spotting. °· A menstrual period that is heavier than usual. °DIAGNOSIS °This condition is usually diagnosed during a routine ultrasound. It can be confirmed with blood tests. °TREATMENT °This condition may be treated by: °· Waiting until your body naturally gets rid of the empty egg sac and placenta (miscarriage). °· Taking medicine to start a miscarriage. This medicine can be taken by mouth or placed into the vagina. °· Having a surgical procedure to remove the tissue. Your health care provider would open the entrance to your womb (dilation) and remove the tissue (curettage). °HOME CARE INSTRUCTIONS °· Take over-the-counter and prescription medicines only as told by your health care provider. °· Talk to your health care provider about when you can try to get pregnant again. Having this condition does not mean you will lose future pregnancies. °· After your miscarriage: °¨ Rest at home for a few days. °¨ You may bleed heavily for a week or more, and you may have light bleeding for a couple weeks after that. Wear a pad until vaginal bleeding stops. °SEEK MEDICAL CARE IF: °· You have a fever or chills. °· Your pain medicine is not helping. °· You have vaginal bleeding that continues for longer than expected. °SEEK IMMEDIATE  MEDICAL CARE IF: °· You have severe abdominal pain. °· You feel dizzy or faint. °· You pass out. °· You have very heavy vaginal bleeding. A sign that vaginal bleeding is very heavy is if blood soaks through two large sanitary pads an hour for more than two hours. °  °This information is not intended to replace advice given to you by your health care provider. Make sure you discuss any questions you have with your health care provider. °  °Document Released: 07/11/2010 Document Revised: 12/15/2014 Document Reviewed: 08/11/2014 °Elsevier Interactive Patient Education ©2016 Elsevier Inc. ° °

## 2015-02-01 NOTE — MAU Note (Signed)
PT  SAYS WAS TOLD  TO RETURN   FOR  LABS.  WAS HERE ON Sunday - WAS TOLD  LABS AND U/S -  TO  RECHECK  HORMONE  LEVEL .     ON Sunday  - BLEEDING  WAS BROWN    WITH PINK AND RED   SPOTS  WHEN SHE WIPED .   TODAY   IS RED - STARTED   AT 0600  -  - WHEN SHE WIPED   . HAS BEEN BLEEDING ALL DAY  .   IN TRIAGE  - PAD  -  SMALL AMT  LIGHT  BROWN.        PAIN ON Sunday-    RIGHT  SIDE  AND BACK- 7.  NO MEDS  FOR  PAIN.  NOW  PAIN IS   6 - NO MEDS  TODAY .

## 2015-02-02 ENCOUNTER — Other Ambulatory Visit: Payer: Medicaid Other

## 2015-02-09 ENCOUNTER — Ambulatory Visit (INDEPENDENT_AMBULATORY_CARE_PROVIDER_SITE_OTHER): Payer: Medicaid Other | Admitting: Obstetrics and Gynecology

## 2015-02-09 ENCOUNTER — Encounter: Payer: Self-pay | Admitting: Obstetrics and Gynecology

## 2015-02-09 VITALS — BP 100/60 | Ht 63.0 in | Wt 164.5 lb

## 2015-02-09 DIAGNOSIS — O021 Missed abortion: Secondary | ICD-10-CM

## 2015-02-09 DIAGNOSIS — Z3202 Encounter for pregnancy test, result negative: Secondary | ICD-10-CM

## 2015-02-09 LAB — POCT URINE PREGNANCY: Preg Test, Ur: NEGATIVE

## 2015-02-09 NOTE — Progress Notes (Signed)
Patient ID: [name], female   DOB: 1980/06/20, 34 y.o.   MRN: [id number] Pt miscarried and was told to come and have some blood work done. Pt has stopped bleeding.

## 2015-02-09 NOTE — Progress Notes (Signed)
   [facility] Visit  Patient name: [name] MRN [id number]  Date of birth: 06-Nov-1980  CC & HPI:  [name] is a 34 y.o. female [facility] 6 [facility]. She is here to have blood work done. Pt denies any any bleeding at this time. On 10/`23/16 pt [facility] HCG 143 and US showing not intrauterine or extrauterine pregnancy. Pregnancy test done in the office today was negative.  ROS:  10 Systems reviewed and all are negative for acute change except as noted in the HPI.  Pertinent History Reviewed:   Reviewed: Significant for DM Medical         Past Medical History  Diagnosis Date  . Complication of anesthesia     Epidural 1 sided  . Headache(784.0)   . Depression     During 1 pregnancy  . Irregular periods   . Recurrent upper respiratory infection (URI)     Mostly when pregnant  . Diabetes mellitus without complication (HCC)   . Gestational diabetes     just started checking BS 03/05/12  . Pregnancy induced hypertension   . Anemia   . Anxiety "fast hearbeat"    took Benadryl at end of pregnancy  . Pregnant 01/27/2015  . Nausea 01/27/2015  . Spotting affecting pregnancy in first trimester 01/27/2015                              Surgical Hx:    Past Surgical History  Procedure Laterality Date  . Wisdom tooth extraction    . Multiple tooth extractions     Medications: Reviewed & Updated - see associated section                       Current outpatient prescriptions:  .  citalopram (CELEXA) 10 MG tablet, Take 10 mg by mouth at bedtime. , Disp: , Rfl:  .  Prenatal Vit-Fe Fumarate-FA (PRENATAL MULTIVITAMIN) TABS tablet, Take 1 tablet by mouth daily., Disp: , Rfl:    Social History: Reviewed -  reports that she quit smoking about 16 years ago. Her smoking use included Cigarettes. She quit after .5 years of use. She has never used smokeless tobacco.  Objective Findings:  Vitals: Blood pressure 100/60, height  5\' 3"  (1.6 m), weight 164 lb 8 oz (74.617 kg), last menstrual period 12/15/2014, not currently breastfeeding.  Physical Examination: discussion only 15 minutes   Assessment & Plan:   A:  1. Spontaneous Miscarriage completed  P:  1.  Continue prenatal vitamins and begin conception efforts after first menses. Discussed risk of miscarriage, future fertility rates,  And when to seek assistance. (6 mos)  By signing my name below, I, Jarvis Morganaylor Wiatt Mahabir, attest that this documentation has been prepared under the direction and in the presence of Tilda BurrowJohn Tishina Lown V, MD. Electronically Signed: Jarvis Morganaylor Esha Fincher, ED Scribe. 02/09/2015. 9:38 AM.  I personally performed the services described in this documentation, which was SCRIBED in my presence. The recorded information has been reviewed and considered accurate. It has been edited as necessary during review. Tilda BurrowFERGUSON,Azzure Garabedian V, MD

## 2015-02-16 ENCOUNTER — Telehealth: Payer: Self-pay | Admitting: Obstetrics and Gynecology

## 2015-02-16 NOTE — Telephone Encounter (Signed)
Pt seen last week for spont ab. No further f/u required at this time.

## 2015-04-10 NOTE — L&D Delivery Note (Signed)
       [name], [name] [[id number]][[id number]]  Delivery Note At 9:03 AM a viable female was delivered via Vaginal, Spontaneous Delivery (Presentation:  OA to ROA ).  APGAR: 9, 9; weight pending  .   Placenta status: delivered spontaneously shultz presentation. 3VC.  Anesthesia:  epidural Episiotomy: None Lacerations: None Est. Blood Loss (mL): 200  Mom to postpartum.  Baby to Couplet care / Skin to Skin.  Charlesetta GaribaldiKathryn Lorraine Dafne Nield CNM 01/24/2016, 9:16 AM

## 2015-05-27 ENCOUNTER — Ambulatory Visit (INDEPENDENT_AMBULATORY_CARE_PROVIDER_SITE_OTHER): Payer: Medicaid Other | Admitting: Adult Health

## 2015-05-27 ENCOUNTER — Encounter: Payer: Self-pay | Admitting: Adult Health

## 2015-05-27 VITALS — BP 128/90 | HR 84 | Ht 64.75 in | Wt 166.5 lb

## 2015-05-27 DIAGNOSIS — R350 Frequency of micturition: Secondary | ICD-10-CM | POA: Diagnosis not present

## 2015-05-27 DIAGNOSIS — O3680X0 Pregnancy with inconclusive fetal viability, not applicable or unspecified: Secondary | ICD-10-CM | POA: Diagnosis not present

## 2015-05-27 DIAGNOSIS — Z3201 Encounter for pregnancy test, result positive: Secondary | ICD-10-CM | POA: Diagnosis not present

## 2015-05-27 DIAGNOSIS — R319 Hematuria, unspecified: Secondary | ICD-10-CM

## 2015-05-27 DIAGNOSIS — M549 Dorsalgia, unspecified: Secondary | ICD-10-CM

## 2015-05-27 DIAGNOSIS — O9989 Other specified diseases and conditions complicating pregnancy, childbirth and the puerperium: Secondary | ICD-10-CM

## 2015-05-27 DIAGNOSIS — O99891 Other specified diseases and conditions complicating pregnancy: Secondary | ICD-10-CM

## 2015-05-27 DIAGNOSIS — Z3490 Encounter for supervision of normal pregnancy, unspecified, unspecified trimester: Secondary | ICD-10-CM

## 2015-05-27 HISTORY — DX: Dorsalgia, unspecified: M54.9

## 2015-05-27 HISTORY — DX: Other specified diseases and conditions complicating pregnancy: O99.891

## 2015-05-27 HISTORY — DX: Hematuria, unspecified: R31.9

## 2015-05-27 LAB — POCT URINALYSIS DIPSTICK
Glucose, UA: NEGATIVE
LEUKOCYTES UA: NEGATIVE
NITRITE UA: NEGATIVE
PROTEIN UA: NEGATIVE

## 2015-05-27 LAB — POCT URINE PREGNANCY: PREG TEST UR: POSITIVE — AB

## 2015-05-27 NOTE — Patient Instructions (Signed)
First Trimester of Pregnancy The first trimester of pregnancy is from week 1 until the end of week 12 (months 1 through 3). A week after a sperm fertilizes an egg, the egg will implant on the wall of the uterus. This embryo will begin to develop into a baby. Genes from you and your partner are forming the baby. The female genes determine whether the baby is a boy or a girl. At 6-8 weeks, the eyes and face are formed, and the heartbeat can be seen on ultrasound. At the end of 12 weeks, all the baby's organs are formed.  Now that you are pregnant, you will want to do everything you can to have a healthy baby. Two of the most important things are to get good prenatal care and to follow your health care provider's instructions. Prenatal care is all the medical care you receive before the baby's birth. This care will help prevent, find, and treat any problems during the pregnancy and childbirth. BODY CHANGES Your body goes through many changes during pregnancy. The changes vary from woman to woman.   You may gain or lose a couple of pounds at first.  You may feel sick to your stomach (nauseous) and throw up (vomit). If the vomiting is uncontrollable, call your health care provider.  You may tire easily.  You may develop headaches that can be relieved by medicines approved by your health care provider.  You may urinate more often. Painful urination may mean you have a bladder infection.  You may develop heartburn as a result of your pregnancy.  You may develop constipation because certain hormones are causing the muscles that push waste through your intestines to slow down.  You may develop hemorrhoids or swollen, bulging veins (varicose veins).  Your breasts may begin to grow larger and become tender. Your nipples may stick out more, and the tissue that surrounds them (areola) may become darker.  Your gums may bleed and may be sensitive to brushing and flossing.  Dark spots or blotches (chloasma,  mask of pregnancy) may develop on your face. This will likely fade after the baby is born.  Your menstrual periods will stop.  You may have a loss of appetite.  You may develop cravings for certain kinds of food.  You may have changes in your emotions from day to day, such as being excited to be pregnant or being concerned that something may go wrong with the pregnancy and baby.  You may have more vivid and strange dreams.  You may have changes in your hair. These can include thickening of your hair, rapid growth, and changes in texture. Some women also have hair loss during or after pregnancy, or hair that feels dry or thin. Your hair will most likely return to normal after your baby is born. WHAT TO EXPECT AT YOUR PRENATAL VISITS During a routine prenatal visit:  You will be weighed to make sure you and the baby are growing normally.  Your blood pressure will be taken.  Your abdomen will be measured to track your baby's growth.  The fetal heartbeat will be listened to starting around week 10 or 12 of your pregnancy.  Test results from any previous visits will be discussed. Your health care provider may ask you:  How you are feeling.  If you are feeling the baby move.  If you have had any abnormal symptoms, such as leaking fluid, bleeding, severe headaches, or abdominal cramping.  If you are using any tobacco products,   including cigarettes, chewing tobacco, and electronic cigarettes.  If you have any questions. Other tests that may be performed during your first trimester include:  Blood tests to find your blood type and to check for the presence of any previous infections. They will also be used to check for low iron levels (anemia) and Rh antibodies. Later in the pregnancy, blood tests for diabetes will be done along with other tests if problems develop.  Urine tests to check for infections, diabetes, or protein in the urine.  An ultrasound to confirm the proper growth  and development of the baby.  An amniocentesis to check for possible genetic problems.  Fetal screens for spina bifida and Down syndrome.  You may need other tests to make sure you and the baby are doing well.  HIV (human immunodeficiency virus) testing. Routine prenatal testing includes screening for HIV, unless you choose not to have this test. HOME CARE INSTRUCTIONS  Medicines  Follow your health care provider's instructions regarding medicine use. Specific medicines may be either safe or unsafe to take during pregnancy.  Take your prenatal vitamins as directed.  If you develop constipation, try taking a stool softener if your health care provider approves. Diet  Eat regular, well-balanced meals. Choose a variety of foods, such as meat or vegetable-based protein, fish, milk and low-fat dairy products, vegetables, fruits, and whole grain breads and cereals. Your health care provider will help you determine the amount of weight gain that is right for you.  Avoid raw meat and uncooked cheese. These carry germs that can cause birth defects in the baby.  Eating four or five small meals rather than three large meals a day may help relieve nausea and vomiting. If you start to feel nauseous, eating a few soda crackers can be helpful. Drinking liquids between meals instead of during meals also seems to help nausea and vomiting.  If you develop constipation, eat more high-fiber foods, such as fresh vegetables or fruit and whole grains. Drink enough fluids to keep your urine clear or pale yellow. Activity and Exercise  Exercise only as directed by your health care provider. Exercising will help you:  Control your weight.  Stay in shape.  Be prepared for labor and delivery.  Experiencing pain or cramping in the lower abdomen or low back is a good sign that you should stop exercising. Check with your health care provider before continuing normal exercises.  Try to avoid standing for long  periods of time. Move your legs often if you must stand in one place for a long time.  Avoid heavy lifting.  Wear low-heeled shoes, and practice good posture.  You may continue to have sex unless your health care provider directs you otherwise. Relief of Pain or Discomfort  Wear a good support bra for breast tenderness.   Take warm sitz baths to soothe any pain or discomfort caused by hemorrhoids. Use hemorrhoid cream if your health care provider approves.   Rest with your legs elevated if you have leg cramps or low back pain.  If you develop varicose veins in your legs, wear support hose. Elevate your feet for 15 minutes, 3-4 times a day. Limit salt in your diet. Prenatal Care  Schedule your prenatal visits by the twelfth week of pregnancy. They are usually scheduled monthly at first, then more often in the last 2 months before delivery.  Write down your questions. Take them to your prenatal visits.  Keep all your prenatal visits as directed by your   health care provider. Safety  Wear your seat belt at all times when driving.  Make a list of emergency phone numbers, including numbers for family, friends, the hospital, and police and fire departments. General Tips  Ask your health care provider for a referral to a local prenatal education class. Begin classes no later than at the beginning of month 6 of your pregnancy.  Ask for help if you have counseling or nutritional needs during pregnancy. Your health care provider can offer advice or refer you to specialists for help with various needs.  Do not use hot tubs, steam rooms, or saunas.  Do not douche or use tampons or scented sanitary pads.  Do not cross your legs for long periods of time.  Avoid cat litter boxes and soil used by cats. These carry germs that can cause birth defects in the baby and possibly loss of the fetus by miscarriage or stillbirth.  Avoid all smoking, herbs, alcohol, and medicines not prescribed by  your health care provider. Chemicals in these affect the formation and growth of the baby.  Do not use any tobacco products, including cigarettes, chewing tobacco, and electronic cigarettes. If you need help quitting, ask your health care provider. You may receive counseling support and other resources to help you quit.  Schedule a dentist appointment. At home, brush your teeth with a soft toothbrush and be gentle when you floss. SEEK MEDICAL CARE IF:   You have dizziness.  You have mild pelvic cramps, pelvic pressure, or nagging pain in the abdominal area.  You have persistent nausea, vomiting, or diarrhea.  You have a bad smelling vaginal discharge.  You have pain with urination.  You notice increased swelling in your face, hands, legs, or ankles. SEEK IMMEDIATE MEDICAL CARE IF:   You have a fever.  You are leaking fluid from your vagina.  You have spotting or bleeding from your vagina.  You have severe abdominal cramping or pain.  You have rapid weight gain or loss.  You vomit blood or material that looks like coffee grounds.  You are exposed to [place] measles and have never had them.  You are exposed to fifth disease or chickenpox.  You develop a severe headache.  You have shortness of breath.  You have any kind of trauma, such as from a fall or a car accident.   This information is not intended to replace advice given to you by your health care provider. Make sure you discuss any questions you have with your health care provider.   Document Released: 03/20/2001 Document Revised: 04/16/2014 Document Reviewed: 02/03/2013 Elsevier Interactive Patient Education [employer] Push fluids Return in 3 days for dating [place]

## 2015-05-27 NOTE — Progress Notes (Signed)
Subjective:     Patient ID: [name], female   DOB: February 15, 1981, 35 y.o.   MRN: [id number]  HPI [name] is a 35 year old white female, in for UPT, has missed a period and had +HPT and is complaining of urinary frequency and back pain and some stomach pain on and off at times. She had miscarriage in October, at about 6 weeks.   Review of Systems Patient denies any headaches, hearing loss, fatigue, blurred vision, shortness of breath, chest pain,  problems with bowel movements, or intercourse. No joint pain or mood swings.See HPI for positives. Reviewed past medical,surgical, social and family history. Reviewed medications and allergies.     Objective:   Physical Exam BP 128/90 mmHg  Pulse 84  Ht 5' 4.75" (1.645 m)  Wt 166 lb 8 oz (75.524 kg)  BMI 27.91 kg/m2  LMP 04/09/2015 UPT +, about 6+6 weeks by LMP with EDD 01/14/16, Skin warm and dry. Neck: mid line trachea, normal thyroid, good ROM, no lymphadenopathy noted. Lungs: clear to ausculation bilaterally. Cardiovascular: regular rate and rhythm.No CVAT, but has low back discomfort with palpation, and some RLQ tenderness.Meidcaid form given.      Assessment:     UPT+  Pregnant Hematuria  Urinary frequency Back pain     Plan:     UA C&S sent Push fluids No sex Return in 3 days for dating [place] Review handout on first trimester

## 2015-05-28 LAB — URINALYSIS, ROUTINE W REFLEX MICROSCOPIC
Bilirubin, UA: NEGATIVE
Glucose, UA: NEGATIVE
Ketones, UA: NEGATIVE
LEUKOCYTES UA: NEGATIVE
Nitrite, UA: NEGATIVE
PH UA: 6 (ref 5.0–7.5)
Protein, UA: NEGATIVE
RBC, UA: NEGATIVE
Specific Gravity, UA: >=1.03 — AB (ref 1.005–1.030)
Urobilinogen, Ur: 0.2 mg/dL (ref 0.2–1.0)

## 2015-05-29 LAB — URINE CULTURE: ORGANISM ID, BACTERIA: NO GROWTH

## 2015-05-30 ENCOUNTER — Other Ambulatory Visit: Payer: Medicaid Other

## 2015-05-30 ENCOUNTER — Telehealth: Payer: Self-pay | Admitting: Adult Health

## 2015-05-30 ENCOUNTER — Other Ambulatory Visit: Payer: Self-pay | Admitting: Adult Health

## 2015-05-30 ENCOUNTER — Ambulatory Visit (INDEPENDENT_AMBULATORY_CARE_PROVIDER_SITE_OTHER): Payer: Medicaid Other

## 2015-05-30 DIAGNOSIS — O3680X0 Pregnancy with inconclusive fetal viability, not applicable or unspecified: Secondary | ICD-10-CM

## 2015-05-30 DIAGNOSIS — Z3A08 8 weeks gestation of pregnancy: Secondary | ICD-10-CM | POA: Diagnosis not present

## 2015-05-30 DIAGNOSIS — O200 Threatened abortion: Secondary | ICD-10-CM

## 2015-05-30 LAB — BETA HCG QUANT (REF LAB): HCG QUANT: 1605 m[IU]/mL

## 2015-05-30 NOTE — Progress Notes (Signed)
[place] NO IUP seen,(7+2wks by LMP),normal ov's bilat,small amount of cul de sac fluid,EEC 2.2cm,pt will have labs today and a f/u ultrasound in 10 days,per Victorino Dike.

## 2015-05-30 NOTE — Telephone Encounter (Signed)
Pt aware QHCG 1605 keep appt 3/2 for [place], increase fluids, if pain continues call for appt.

## 2015-06-01 ENCOUNTER — Telehealth: Payer: Self-pay | Admitting: Adult Health

## 2015-06-01 DIAGNOSIS — O3680X0 Pregnancy with inconclusive fetal viability, not applicable or unspecified: Secondary | ICD-10-CM

## 2015-06-01 NOTE — Telephone Encounter (Signed)
Will get [facility] today

## 2015-06-02 ENCOUNTER — Telehealth: Payer: Self-pay | Admitting: Adult Health

## 2015-06-02 LAB — BETA HCG QUANT (REF LAB): HCG QUANT: 3574 m[IU]/mL

## 2015-06-02 NOTE — Telephone Encounter (Signed)
Pt aware QHCG doubling will get [place] tomorrow or Monday

## 2015-06-03 ENCOUNTER — Ambulatory Visit (INDEPENDENT_AMBULATORY_CARE_PROVIDER_SITE_OTHER): Payer: Medicaid Other

## 2015-06-03 ENCOUNTER — Other Ambulatory Visit: Payer: Self-pay | Admitting: Adult Health

## 2015-06-03 DIAGNOSIS — Z3A01 Less than 8 weeks gestation of pregnancy: Secondary | ICD-10-CM

## 2015-06-03 DIAGNOSIS — O3680X0 Pregnancy with inconclusive fetal viability, not applicable or unspecified: Secondary | ICD-10-CM

## 2015-06-03 NOTE — Progress Notes (Signed)
US TV: 5+4 wks GS ,no fetal pole or ys seen,normal ov's bilat,NO free fluid seen,pt will come back in 10 days for f/u ultrasound per Victorino Dike.

## 2015-06-09 ENCOUNTER — Other Ambulatory Visit: Payer: Medicaid Other

## 2015-06-09 ENCOUNTER — Other Ambulatory Visit: Payer: Self-pay | Admitting: Obstetrics and Gynecology

## 2015-06-09 ENCOUNTER — Other Ambulatory Visit: Payer: Self-pay | Admitting: Adult Health

## 2015-06-09 DIAGNOSIS — O3680X0 Pregnancy with inconclusive fetal viability, not applicable or unspecified: Secondary | ICD-10-CM

## 2015-06-10 ENCOUNTER — Other Ambulatory Visit: Payer: Medicaid Other

## 2015-06-13 ENCOUNTER — Ambulatory Visit (INDEPENDENT_AMBULATORY_CARE_PROVIDER_SITE_OTHER): Payer: Medicaid Other

## 2015-06-13 DIAGNOSIS — O3680X0 Pregnancy with inconclusive fetal viability, not applicable or unspecified: Secondary | ICD-10-CM | POA: Diagnosis not present

## 2015-06-13 DIAGNOSIS — Z3A01 Less than 8 weeks gestation of pregnancy: Secondary | ICD-10-CM | POA: Diagnosis not present

## 2015-06-13 NOTE — Progress Notes (Signed)
US 6+6wks,single IUP w/ys, pos fht 128 bpm,normal ov's bilat,crl 10mm

## 2015-06-21 ENCOUNTER — Other Ambulatory Visit (HOSPITAL_COMMUNITY)
Admission: RE | Admit: 2015-06-21 | Discharge: 2015-06-21 | Disposition: A | Discharge status: Home / Self Care | Payer: Medicaid Other | Source: Ambulatory Visit | Attending: Obstetrics & Gynecology | Admitting: Obstetrics & Gynecology

## 2015-06-21 ENCOUNTER — Encounter: Payer: Self-pay | Admitting: Women's Health

## 2015-06-21 ENCOUNTER — Ambulatory Visit (INDEPENDENT_AMBULATORY_CARE_PROVIDER_SITE_OTHER): Payer: Medicaid Other | Admitting: Women's Health

## 2015-06-21 VITALS — BP 102/62 | HR 72 | Wt 167.0 lb

## 2015-06-21 DIAGNOSIS — Z3682 Encounter for antenatal screening for nuchal translucency: Secondary | ICD-10-CM

## 2015-06-21 DIAGNOSIS — Z641 Problems related to multiparity: Secondary | ICD-10-CM

## 2015-06-21 DIAGNOSIS — Z1151 Encounter for screening for human papillomavirus (HPV): Secondary | ICD-10-CM | POA: Diagnosis present

## 2015-06-21 DIAGNOSIS — Z1389 Encounter for screening for other disorder: Secondary | ICD-10-CM

## 2015-06-21 DIAGNOSIS — Z113 Encounter for screening for infections with a predominantly sexual mode of transmission: Secondary | ICD-10-CM | POA: Diagnosis present

## 2015-06-21 DIAGNOSIS — Z369 Encounter for antenatal screening, unspecified: Secondary | ICD-10-CM

## 2015-06-21 DIAGNOSIS — Z23 Encounter for immunization: Secondary | ICD-10-CM

## 2015-06-21 DIAGNOSIS — Z8759 Personal history of other complications of pregnancy, childbirth and the puerperium: Secondary | ICD-10-CM | POA: Insufficient documentation

## 2015-06-21 DIAGNOSIS — O0991 Supervision of high risk pregnancy, unspecified, first trimester: Secondary | ICD-10-CM

## 2015-06-21 DIAGNOSIS — O09529 Supervision of elderly multigravida, unspecified trimester: Secondary | ICD-10-CM | POA: Insufficient documentation

## 2015-06-21 DIAGNOSIS — O09521 Supervision of elderly multigravida, first trimester: Secondary | ICD-10-CM | POA: Diagnosis not present

## 2015-06-21 DIAGNOSIS — Z01419 Encounter for gynecological examination (general) (routine) without abnormal findings: Secondary | ICD-10-CM | POA: Insufficient documentation

## 2015-06-21 DIAGNOSIS — O09299 Supervision of pregnancy with other poor reproductive or obstetric history, unspecified trimester: Secondary | ICD-10-CM

## 2015-06-21 DIAGNOSIS — Z124 Encounter for screening for malignant neoplasm of cervix: Secondary | ICD-10-CM

## 2015-06-21 DIAGNOSIS — O360111 Maternal care for anti-D [Rh] antibodies, first trimester, fetus 1: Secondary | ICD-10-CM

## 2015-06-21 DIAGNOSIS — Z3490 Encounter for supervision of normal pregnancy, unspecified, unspecified trimester: Secondary | ICD-10-CM | POA: Insufficient documentation

## 2015-06-21 DIAGNOSIS — O09291 Supervision of pregnancy with other poor reproductive or obstetric history, first trimester: Secondary | ICD-10-CM

## 2015-06-21 DIAGNOSIS — Z331 Pregnant state, incidental: Secondary | ICD-10-CM

## 2015-06-21 DIAGNOSIS — O360110 Maternal care for anti-D [Rh] antibodies, first trimester, not applicable or unspecified: Secondary | ICD-10-CM

## 2015-06-21 DIAGNOSIS — Z3491 Encounter for supervision of normal pregnancy, unspecified, first trimester: Secondary | ICD-10-CM

## 2015-06-21 DIAGNOSIS — Z8632 Personal history of gestational diabetes: Secondary | ICD-10-CM

## 2015-06-21 DIAGNOSIS — F419 Anxiety disorder, unspecified: Secondary | ICD-10-CM

## 2015-06-21 DIAGNOSIS — O210 Mild hyperemesis gravidarum: Secondary | ICD-10-CM

## 2015-06-21 DIAGNOSIS — Z6791 Unspecified blood type, Rh negative: Secondary | ICD-10-CM | POA: Insufficient documentation

## 2015-06-21 DIAGNOSIS — O26899 Other specified pregnancy related conditions, unspecified trimester: Secondary | ICD-10-CM

## 2015-06-21 DIAGNOSIS — F418 Other specified anxiety disorders: Secondary | ICD-10-CM

## 2015-06-21 DIAGNOSIS — Z3A08 8 weeks gestation of pregnancy: Secondary | ICD-10-CM | POA: Diagnosis not present

## 2015-06-21 DIAGNOSIS — Z0283 Encounter for blood-alcohol and blood-drug test: Secondary | ICD-10-CM

## 2015-06-21 HISTORY — DX: Supervision of pregnancy with other poor reproductive or obstetric history, unspecified trimester: O09.299

## 2015-06-21 LAB — POCT URINALYSIS DIPSTICK
GLUCOSE UA: NEGATIVE
KETONES UA: NEGATIVE
Leukocytes, UA: NEGATIVE
Nitrite, UA: NEGATIVE
RBC UA: NEGATIVE

## 2015-06-21 MED ORDER — PROMETHAZINE HCL 6.25 MG/5ML PO SYRP
ORAL_SOLUTION | ORAL | Status: DC
Start: 2015-06-21 — End: 2015-10-06

## 2015-06-21 NOTE — Patient Instructions (Addendum)
You will have your sugar test next visit.  Please do not eat or drink anything after midnight the night before you come, not even water.  You will be here for at least two hours.     Begin taking a  baby aspirin daily at 12 weeks of pregnancy (07/19/15) to decrease risk of preeclampsia during pregnancy   Nausea & Vomiting  Have saltine crackers or pretzels by your bed and eat a few bites before you raise your head out of bed in the morning  Eat small frequent meals throughout the day instead of large meals  Drink plenty of fluids throughout the day to stay hydrated, just don't drink a lot of fluids with your meals.  This can make your stomach fill up faster making you feel sick  Do not brush your teeth right after you eat  Products with real ginger are good for nausea, like ginger ale and ginger hard candy Make sure it says made with real ginger!  Sucking on sour candy like lemon heads is also good for nausea  If your prenatal vitamins make you nauseated, take them at night so you will sleep through the nausea  Sea Bands  If you feel like you need medicine for the nausea & vomiting please let us know  If you are unable to keep any fluids or food down please let us know   Constipation  Drink plenty of fluid, preferably water, throughout the day  Eat foods high in fiber such as fruits, vegetables, and grains  Exercise, such as walking, is a good way to keep your bowels regular  Drink warm fluids, especially warm prune juice, or decaf coffee  Eat a 1/2 cup of real oatmeal (not instant), 1/2 cup applesauce, and 1/2-1 cup warm prune juice every day  If needed, you may take Colace (docusate sodium) stool softener once or twice a day to help keep the stool soft. If you are pregnant, wait until you are out of your first trimester (12-14 weeks of pregnancy)  If you still are having problems with constipation, you may take Miralax once daily as needed to help keep your bowels  regular.  If you are pregnant, wait until you are out of your first trimester (12-14 weeks of pregnancy)   First Trimester of Pregnancy The first trimester of pregnancy is from week 1 until the end of week 12 (months 1 through 3). A week after a sperm fertilizes an egg, the egg will implant on the wall of the uterus. This embryo will begin to develop into a baby. Genes from you and your partner are forming the baby. The female genes determine whether the baby is a boy or a girl. At 6-8 weeks, the eyes and face are formed, and the heartbeat can be seen on ultrasound. At the end of 12 weeks, all the baby's organs are formed.  Now that you are pregnant, you will want to do everything you can to have a healthy baby. Two of the most important things are to get good prenatal care and to follow your health care provider's instructions. Prenatal care is all the medical care you receive before the baby's birth. This care will help prevent, find, and treat any problems during the pregnancy and childbirth. BODY CHANGES Your body goes through many changes during pregnancy. The changes vary from woman to woman.   You may gain or lose a couple of pounds at first.  You may feel sick to your stomach (nauseous)  and throw up (vomit). If the vomiting is uncontrollable, call your health care provider.  You may tire easily.  You may develop headaches that can be relieved by medicines approved by your health care provider.  You may urinate more often. Painful urination may mean you have a bladder infection.  You may develop heartburn as a result of your pregnancy.  You may develop constipation because certain hormones are causing the muscles that push waste through your intestines to slow down.  You may develop hemorrhoids or swollen, bulging veins (varicose veins).  Your breasts may begin to grow larger and become tender. Your nipples may stick out more, and the tissue that surrounds them (areola) may become  darker.  Your gums may bleed and may be sensitive to brushing and flossing.  Dark spots or blotches (chloasma, mask of pregnancy) may develop on your face. This will likely fade after the baby is born.  Your menstrual periods will stop.  You may have a loss of appetite.  You may develop cravings for certain kinds of food.  You may have changes in your emotions from day to day, such as being excited to be pregnant or being concerned that something may go wrong with the pregnancy and baby.  You may have more vivid and strange dreams.  You may have changes in your hair. These can include thickening of your hair, rapid growth, and changes in texture. Some women also have hair loss during or after pregnancy, or hair that feels dry or thin. Your hair will most likely return to normal after your baby is born. WHAT TO EXPECT AT YOUR PRENATAL VISITS During a routine prenatal visit:  You will be weighed to make sure you and the baby are growing normally.  Your blood pressure will be taken.  Your abdomen will be measured to track your baby's growth.  The fetal heartbeat will be listened to starting around week 10 or 12 of your pregnancy.  Test results from any previous visits will be discussed. Your health care provider may ask you:  How you are feeling.  If you are feeling the baby move.  If you have had any abnormal symptoms, such as leaking fluid, bleeding, severe headaches, or abdominal cramping.  If you have any questions. Other tests that may be performed during your first trimester include:  Blood tests to find your blood type and to check for the presence of any previous infections. They will also be used to check for low iron levels (anemia) and Rh antibodies. Later in the pregnancy, blood tests for diabetes will be done along with other tests if problems develop.  Urine tests to check for infections, diabetes, or protein in the urine.  An ultrasound to confirm the proper  growth and development of the baby.  An amniocentesis to check for possible genetic problems.  Fetal screens for spina bifida and Down syndrome.  You may need other tests to make sure you and the baby are doing well. HOME CARE INSTRUCTIONS  Medicines  Follow your health care provider's instructions regarding medicine use. Specific medicines may be either safe or unsafe to take during pregnancy.  Take your prenatal vitamins as directed.  If you develop constipation, try taking a stool softener if your health care provider approves. Diet  Eat regular, well-balanced meals. Choose a variety of foods, such as meat or vegetable-based protein, fish, milk and low-fat dairy products, vegetables, fruits, and whole grain breads and cereals. Your health care provider will help  you determine the amount of weight gain that is right for you.  Avoid raw meat and uncooked cheese. These carry germs that can cause birth defects in the baby.  Eating four or five small meals rather than three large meals a day may help relieve nausea and vomiting. If you start to feel nauseous, eating a few soda crackers can be helpful. Drinking liquids between meals instead of during meals also seems to help nausea and vomiting.  If you develop constipation, eat more high-fiber foods, such as fresh vegetables or fruit and whole grains. Drink enough fluids to keep your urine clear or pale yellow. Activity and Exercise  Exercise only as directed by your health care provider. Exercising will help you:  Control your weight.  Stay in shape.  Be prepared for labor and delivery.  Experiencing pain or cramping in the lower abdomen or low back is a good sign that you should stop exercising. Check with your health care provider before continuing normal exercises.  Try to avoid standing for long periods of time. Move your legs often if you must stand in one place for a long time.  Avoid heavy lifting.  Wear low-heeled  shoes, and practice good posture.  You may continue to have sex unless your health care provider directs you otherwise. Relief of Pain or Discomfort  Wear a good support bra for breast tenderness.   Take warm sitz baths to soothe any pain or discomfort caused by hemorrhoids. Use hemorrhoid cream if your health care provider approves.   Rest with your legs elevated if you have leg cramps or low back pain.  If you develop varicose veins in your legs, wear support hose. Elevate your feet for 15 minutes, 3-4 times a day. Limit salt in your diet. Prenatal Care  Schedule your prenatal visits by the twelfth week of pregnancy. They are usually scheduled monthly at first, then more often in the last 2 months before delivery.  Write down your questions. Take them to your prenatal visits.  Keep all your prenatal visits as directed by your health care provider. Safety  Wear your seat belt at all times when driving.  Make a list of emergency phone numbers, including numbers for family, friends, the hospital, and police and fire departments. General Tips  Ask your health care provider for a referral to a local prenatal education class. Begin classes no later than at the beginning of month 6 of your pregnancy.  Ask for help if you have counseling or nutritional needs during pregnancy. Your health care provider can offer advice or refer you to specialists for help with various needs.  Do not use hot tubs, steam rooms, or saunas.  Do not douche or use tampons or scented sanitary pads.  Do not cross your legs for long periods of time.  Avoid cat litter boxes and soil used by cats. These carry germs that can cause birth defects in the baby and possibly loss of the fetus by miscarriage or stillbirth.  Avoid all smoking, herbs, alcohol, and medicines not prescribed by your health care provider. Chemicals in these affect the formation and growth of the baby.  Schedule a dentist appointment. At  home, brush your teeth with a soft toothbrush and be gentle when you floss. SEEK MEDICAL CARE IF:   You have dizziness.  You have mild pelvic cramps, pelvic pressure, or nagging pain in the abdominal area.  You have persistent nausea, vomiting, or diarrhea.  You have a bad smelling vaginal  discharge.  You have pain with urination.  You notice increased swelling in your face, hands, legs, or ankles. SEEK IMMEDIATE MEDICAL CARE IF:   You have a fever.  You are leaking fluid from your vagina.  You have spotting or bleeding from your vagina.  You have severe abdominal cramping or pain.  You have rapid weight gain or loss.  You vomit blood or material that looks like coffee grounds.  You are exposed to [place] measles and have never had them.  You are exposed to fifth disease or chickenpox.  You develop a severe headache.  You have shortness of breath.  You have any kind of trauma, such as from a fall or a car accident. Document Released: 03/20/2001 Document Revised: 08/10/2013 Document Reviewed: 02/03/2013 [facility] Patient Information 2015 [place], [place]. This information is not intended to replace advice given to you by your health care provider. Make sure you discuss any questions you have with your health care provider.

## 2015-06-21 NOTE — Progress Notes (Addendum)
Subjective:  [name] is a 35 y.o. [id number] Caucasian female at 5753w0d by 6wk u/s, being seen today for her first obstetrical visit.  Her obstetrical history is significant for term uncomplicated svb x 5, sab x 2. Had A1DM and GHTN w/ last pregnancy. AMA, grand multip.  Anxiety- on celexa. Pregnancy history fully reviewed.  Patient reports nausea w/ occ vomiting- requests meds- can't swallow pills- requests liquid phenergan. Denies vb, cramping, uti s/s, abnormal/malodorous vag d/c, or vulvovaginal itching/irritation.  BP 102/62 mmHg  Pulse 72  Wt 167 lb (75.751 kg)  LMP 04/09/2015  HISTORY: OB History  Gravida Para Term Preterm AB SAB TAB Ectopic Multiple Living  8 5 5  0 2 2    5     # Outcome Date GA Lbr Len/2nd Weight Sex Delivery Anes PTL Lv  8 Current           7 SAB 02/01/15 4453w0d         6 Term 05/10/12 3062w4d 04:22 / 00:10 7 lb (3.175 kg) F Vag-Spont EPI N Y     Comments: none  5 Term 08/18/07 10528w0d  6 lb 6 oz (2.892 kg) F Vag-Spont EPI N Y  4 Term 03/09/05 65353w0d  7 lb 5 oz (3.317 kg) F Vag-Spont EPI N Y  3 Term 01/24/04 16353w0d  6 lb 8 oz (2.948 kg) F Vag-Spont EPI N Y  2 SAB 01/11/03 7243w0d         1 Term 07/28/99 68353w0d  6 lb 4 oz (2.835 kg) F Vag-Spont EPI N Y     Past Medical History  Diagnosis Date  . Complication of anesthesia     Epidural 1 sided  . Headache(784.0)   . Depression     During 1 pregnancy  . Irregular periods   . Recurrent upper respiratory infection (URI)     Mostly when pregnant  . Diabetes mellitus without complication (HCC)   . Gestational diabetes     just started checking BS 03/05/12  . Pregnancy induced hypertension   . Anemia   . Anxiety "fast hearbeat"    took Benadryl at end of pregnancy  . Pregnant 01/27/2015  . Nausea 01/27/2015  . Spotting affecting pregnancy in first trimester 01/27/2015  . Hematuria 05/27/2015  . Back pain affecting pregnancy in first trimester 05/27/2015   Past Surgical History  Procedure Laterality Date  .  Wisdom tooth extraction    . Multiple tooth extractions     Family History  Problem Relation Age of Onset  . Heart disease Maternal Grandmother   . Cancer Maternal Grandmother     brain  . Heart failure Maternal Grandmother   . Cancer Paternal Grandmother     brain  . Hypertension Mother   . Anxiety disorder Mother   . Depression Mother   . Hypertension Father   . Anxiety disorder Sister   . ADD / ADHD Daughter   . Asthma Daughter   . ADD / ADHD Daughter   . Seizures Daughter   . ADD / ADHD Daughter   . ADD / ADHD Daughter     Exam   System:     General: Well developed & nourished, no acute distress   Skin: Warm & dry, normal coloration and turgor, no rashes   Neurologic: Alert & oriented, normal mood   Cardiovascular: Regular rate & rhythm   Respiratory: Effort & rate normal, LCTAB, acyanotic   Abdomen: Soft, non tender   Extremities: normal strength, tone  Pelvic Exam:    Perineum: Normal perineum   Vulva: Normal, no lesions   Vagina:  Normal mucosa, normal discharge   Cervix: Normal, bulbous, appears closed   Uterus: Normal size/shape/contour for GA   Thin prep pap smear obtained w/ high risk HPV cotesting   Assessment:   Pregnancy: [id number] Patient Active Problem List   Diagnosis Date Noted  . Supervision of normal pregnancy 06/21/2015    Priority: High  . 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 History of gestational diabetes in prior pregnancy, currently pregnant 06/21/2015  . History of gestational hypertension 06/21/2015  . Hematuria 05/27/2015  . Back pain affecting pregnancy in first trimester 05/27/2015  . Anxiety 03/05/2012  . Tachycardia 03/05/2012    [redacted]w[redacted]d [id number] New OB visit AMA Grand multip H/O A1DM H/O GHTN Rh neg Anxiety  Plan:  Initial labs drawn Continue prenatal vitamins Problem list reviewed and updated Reviewed n/v relief measures and warning s/s to report Rx liquid phenergan for n/v per pt request Reviewed recommended  weight gain based on pre-gravid BMI Encouraged well-balanced diet Genetic Screening discussed Integrated Screen: requested Cystic fibrosis screening discussed declined Ultrasound discussed; fetal survey: requested Follow up in this week for early 2hr gtt (no visit), then 4 weeks for 1st nt/it and visit [facility] completed Flu shot today Begin baby ASA at 12wks d/t h/o GHTN OK to continue celexa  Marge Duncans CNM, [facility] 06/21/2015 11:40 AM

## 2015-06-23 LAB — URINALYSIS, ROUTINE W REFLEX MICROSCOPIC
BILIRUBIN UA: NEGATIVE
GLUCOSE, UA: NEGATIVE
Ketones, UA: NEGATIVE
LEUKOCYTES UA: NEGATIVE
Nitrite, UA: NEGATIVE
RBC UA: NEGATIVE
Specific Gravity, UA: >=1.03 — AB (ref 1.005–1.030)
UUROB: 1 mg/dL (ref 0.2–1.0)
pH, UA: 6.5 (ref 5.0–7.5)

## 2015-06-23 LAB — PMP SCREEN PROFILE (10S), URINE
AMPHETAMINE SCRN UR: NEGATIVE ng/mL
Barbiturate Screen, Ur: NEGATIVE ng/mL
Benzodiazepine Screen, Urine: NEGATIVE ng/mL
Cannabinoids Ur Ql Scn: NEGATIVE ng/mL
Cocaine(Metab.)Screen, Urine: NEGATIVE ng/mL
Creatinine(Crt), U: 275.8 mg/dL (ref 20.0–300.0)
METHADONE SCREEN, URINE: NEGATIVE ng/mL
OXYCODONE+OXYMORPHONE UR QL SCN: NEGATIVE ng/mL
Opiate Scrn, Ur: NEGATIVE ng/mL
PCP SCRN UR: NEGATIVE ng/mL
PROPOXYPHENE SCREEN: NEGATIVE ng/mL
Ph of Urine: 6.4 (ref 4.5–8.9)

## 2015-06-23 LAB — MICROSCOPIC EXAMINATION: CASTS: NONE SEEN /LPF

## 2015-06-23 LAB — CBC
Hematocrit: 39.7 % (ref 34.0–46.6)
Hemoglobin: 13.3 g/dL (ref 11.1–15.9)
MCH: 29.6 pg (ref 26.6–33.0)
MCHC: 33.5 g/dL (ref 31.5–35.7)
MCV: 88 fL (ref 79–97)
PLATELETS: 325 10*3/uL (ref 150–379)
RBC: 4.49 x10E6/uL (ref 3.77–5.28)
RDW: 14.3 % (ref 12.3–15.4)
WBC: 7.4 10*3/uL (ref 3.4–10.8)

## 2015-06-23 LAB — URINE CULTURE

## 2015-06-23 LAB — HIV ANTIBODY (ROUTINE TESTING W REFLEX): HIV Screen 4th Generation wRfx: NONREACTIVE

## 2015-06-23 LAB — CYTOLOGY - PAP

## 2015-06-23 LAB — RPR: RPR Ser Ql: NONREACTIVE

## 2015-06-23 LAB — RUBELLA SCREEN: Rubella Antibodies, IGG: 4.3 index (ref >0.99)

## 2015-06-23 LAB — ANTIBODY SCREEN: ANTIBODY SCREEN: NEGATIVE

## 2015-06-23 LAB — ABO/RH: Rh Factor: NEGATIVE

## 2015-06-23 LAB — HEPATITIS B SURFACE ANTIGEN: Hepatitis B Surface Ag: NEGATIVE

## 2015-06-23 LAB — VARICELLA ZOSTER ANTIBODY, IGG: Varicella zoster IgG: 451 index (ref >165)

## 2015-06-24 ENCOUNTER — Other Ambulatory Visit: Payer: Medicaid Other

## 2015-06-27 ENCOUNTER — Telehealth: Payer: Self-pay | Admitting: *Deleted

## 2015-06-27 ENCOUNTER — Other Ambulatory Visit: Payer: Medicaid Other

## 2015-06-27 NOTE — Telephone Encounter (Signed)
Pt c/o right sided pain, no vaginal bleeding. Pt informed to push lots of water, take tylenol if no improvement call our office back. Pt verbalized understanding.

## 2015-06-28 ENCOUNTER — Other Ambulatory Visit: Payer: Medicaid Other

## 2015-06-29 LAB — GLUCOSE TOLERANCE, 2 HOURS W/ 1HR
GLUCOSE, 2 HOUR: 139 mg/dL (ref 65–152)
GLUCOSE, FASTING: 79 mg/dL (ref 65–91)
Glucose, 1 hour: 158 mg/dL (ref 65–179)

## 2015-07-10 ENCOUNTER — Encounter (HOSPITAL_COMMUNITY): Payer: Self-pay | Admitting: *Deleted

## 2015-07-10 ENCOUNTER — Inpatient Hospital Stay (HOSPITAL_COMMUNITY)
Admission: AD | Admit: 2015-07-10 | Discharge: 2015-07-10 | Disposition: A | Discharge status: Home / Self Care | Payer: Medicaid Other | Source: Ambulatory Visit | Attending: Obstetrics and Gynecology | Admitting: Obstetrics and Gynecology

## 2015-07-10 DIAGNOSIS — O26891 Other specified pregnancy related conditions, first trimester: Secondary | ICD-10-CM | POA: Diagnosis not present

## 2015-07-10 DIAGNOSIS — Z3A10 10 weeks gestation of pregnancy: Secondary | ICD-10-CM | POA: Diagnosis not present

## 2015-07-10 DIAGNOSIS — Z87891 Personal history of nicotine dependence: Secondary | ICD-10-CM | POA: Diagnosis not present

## 2015-07-10 DIAGNOSIS — R42 Dizziness and giddiness: Secondary | ICD-10-CM | POA: Diagnosis present

## 2015-07-10 DIAGNOSIS — J302 Other seasonal allergic rhinitis: Secondary | ICD-10-CM | POA: Diagnosis not present

## 2015-07-10 DIAGNOSIS — O9989 Other specified diseases and conditions complicating pregnancy, childbirth and the puerperium: Secondary | ICD-10-CM

## 2015-07-10 LAB — URINALYSIS, ROUTINE W REFLEX MICROSCOPIC
BILIRUBIN URINE: NEGATIVE
GLUCOSE, UA: NEGATIVE mg/dL
HGB URINE DIPSTICK: NEGATIVE
Ketones, ur: 15 mg/dL — AB
Nitrite: NEGATIVE
Protein, ur: NEGATIVE mg/dL
SPECIFIC GRAVITY, URINE: 1.02 (ref 1.005–1.030)
pH: 7 (ref 5.0–8.0)

## 2015-07-10 LAB — URINE MICROSCOPIC-ADD ON: RBC / HPF: NONE SEEN RBC/hpf (ref 0–5)

## 2015-07-10 NOTE — MAU Provider Note (Signed)
History     CSN: [id number]  Arrival date and time: 07/10/15 1344   First Provider Initiated Contact with Patient 07/10/15 1438      Chief Complaint  Patient presents with  . Dizziness  . Nasal Drainage    HPI [name] 35 y.o. 5074w5d Having nasal drainage for 3 weeks, has itchy eyes, and feels weak.  Is unsure what medications to take.  Usually takes claritin or zyrtec or flonase.  Advised she can take any of these that would be helpful for her.  All are safe in pregnancy.  OB History    Gravida Para Term Preterm AB TAB SAB Ectopic Multiple Living   8 5 5  0 2  2   5       Past Medical History  Diagnosis Date  . Complication of anesthesia     Epidural 1 sided  . Headache(784.0)   . Depression     During 1 pregnancy  . Irregular periods   . Recurrent upper respiratory infection (URI)     Mostly when pregnant  . Diabetes mellitus without complication (HCC)   . Gestational diabetes     just started checking BS 03/05/12  . Pregnancy induced hypertension   . Anemia   . Anxiety "fast hearbeat"    took Benadryl at end of pregnancy  . Pregnant 01/27/2015  . Nausea 01/27/2015  . Spotting affecting pregnancy in first trimester 01/27/2015  . Hematuria 05/27/2015  . Back pain affecting pregnancy in first trimester 05/27/2015    Past Surgical History  Procedure Laterality Date  . Wisdom tooth extraction    . Multiple tooth extractions      Family History  Problem Relation Age of Onset  . Heart disease Maternal Grandmother   . Cancer Maternal Grandmother     brain  . Heart failure Maternal Grandmother   . Cancer Paternal Grandmother     brain  . Hypertension Mother   . Anxiety disorder Mother   . Depression Mother   . Hypertension Father   . Anxiety disorder Sister   . ADD / ADHD Daughter   . Asthma Daughter   . ADD / ADHD Daughter   . Seizures Daughter   . ADD / ADHD Daughter   . ADD / ADHD Daughter     Social History  Substance Use Topics  . Smoking  status: Former Smoker -- .5 years    Types: Cigarettes    Quit date: 09/17/1998  . Smokeless tobacco: Never Used  . Alcohol Use: No     Comment: not while pregnant    Allergies: No Known Allergies  Prescriptions prior to admission  Medication Sig Dispense Refill Last Dose  . acetaminophen (TYLENOL) 325 MG tablet Take 650 mg by mouth every 6 (six) hours as needed.   Taking  . citalopram (CELEXA) 10 MG tablet Take 10 mg by mouth at bedtime.    Taking  . Prenatal Vit-Fe Fumarate-FA (PRENATAL MULTIVITAMIN) TABS tablet Take 1 tablet by mouth daily.   Taking  . promethazine (PHENERGAN) 6.25 MG/5ML syrup 12.5-25mg  every 6 hours as needed for nausea and or vomiting 120 mL 0     Review of Systems  Constitutional: Positive for malaise/fatigue. Negative for fever.  HENT:       Nasal drainage  Eyes:       Itchy eyes  Gastrointestinal: Negative for nausea, vomiting and abdominal pain.  Genitourinary:       No vaginal discharge. No vaginal bleeding. No dysuria.  Physical Exam   Blood pressure 112/80, pulse 91, temperature 98.2 F (36.8 C), temperature source Oral, resp. rate 16, height  (1.651 m), weight 167 lb (75.751 kg), last menstrual period 04/09/2015.  Physical Exam  Nursing note and vitals reviewed. Constitutional: She is oriented to person, place, and time. She appears well-developed and well-nourished.  HENT:  Head: Normocephalic.  Eyes: EOM are normal.  Neck: Neck supple.  GI:  FHT heard with doppler - 172  Musculoskeletal: Normal range of motion.  Neurological: She is alert and oriented to person, place, and time.  Skin: Skin is warm and dry.  Psychiatric: She has a normal mood and affect.    MAU Course  Procedures Results for orders placed or performed during the hospital encounter of 07/10/15 (from the past 24 hour(s))  Urinalysis, Routine w reflex microscopic (not at [facility])     Status: Abnormal   Collection Time: 07/10/15  1:55 PM  Result Value Ref Range    Color, Urine YELLOW YELLOW   APPearance CLEAR CLEAR   Specific Gravity, Urine 1.020 1.005 - 1.030   pH 7.0 5.0 - 8.0   Glucose, UA NEGATIVE NEGATIVE mg/dL   Hgb urine dipstick NEGATIVE NEGATIVE   Bilirubin Urine NEGATIVE NEGATIVE   Ketones, ur 15 (A) NEGATIVE mg/dL   Protein, ur NEGATIVE NEGATIVE mg/dL   Nitrite NEGATIVE NEGATIVE   Leukocytes, UA TRACE (A) NEGATIVE  Urine microscopic-add on     Status: Abnormal   Collection Time: 07/10/15  1:55 PM  Result Value Ref Range   Squamous Epithelial / LPF 6-30 (A) NONE SEEN   WBC, UA 0-5 0 - 5 WBC/hpf   RBC / HPF NONE SEEN 0 - 5 RBC/hpf   Bacteria, UA FEW (A) NONE SEEN   Urine-Other MUCOUS PRESENT     MDM   Assessment and Plan  Allergies 10w pregnancy  Plan List of medications that are safe in pregnancy given to patient. Keep your appointments as scheduled in the office.  [name] 07/10/2015, 2:47 PM

## 2015-07-10 NOTE — Discharge Instructions (Signed)
Keep your appointments as scheduled in the office. Call the office if you have questions or concerns.

## 2015-07-10 NOTE — MAU Note (Signed)
Patient presents at [redacted] weeks gestation with c/o fatigue, dizziness and nasal drainage due to allergies. Denies bleeding but states she has a small clear discharge sometimes when wiping after voiding.

## 2015-07-19 ENCOUNTER — Ambulatory Visit (INDEPENDENT_AMBULATORY_CARE_PROVIDER_SITE_OTHER): Payer: Medicaid Other

## 2015-07-19 ENCOUNTER — Ambulatory Visit (INDEPENDENT_AMBULATORY_CARE_PROVIDER_SITE_OTHER): Payer: Medicaid Other | Admitting: Women's Health

## 2015-07-19 ENCOUNTER — Encounter: Payer: Self-pay | Admitting: Women's Health

## 2015-07-19 VITALS — BP 100/70 | HR 60 | Wt 157.0 lb

## 2015-07-19 DIAGNOSIS — Z331 Pregnant state, incidental: Secondary | ICD-10-CM

## 2015-07-19 DIAGNOSIS — Z3682 Encounter for antenatal screening for nuchal translucency: Secondary | ICD-10-CM

## 2015-07-19 DIAGNOSIS — O0991 Supervision of high risk pregnancy, unspecified, first trimester: Secondary | ICD-10-CM | POA: Diagnosis not present

## 2015-07-19 DIAGNOSIS — Z1389 Encounter for screening for other disorder: Secondary | ICD-10-CM | POA: Diagnosis not present

## 2015-07-19 DIAGNOSIS — Z3A12 12 weeks gestation of pregnancy: Secondary | ICD-10-CM

## 2015-07-19 DIAGNOSIS — O09521 Supervision of elderly multigravida, first trimester: Secondary | ICD-10-CM | POA: Diagnosis not present

## 2015-07-19 DIAGNOSIS — Z3491 Encounter for supervision of normal pregnancy, unspecified, first trimester: Secondary | ICD-10-CM

## 2015-07-19 DIAGNOSIS — K089 Disorder of teeth and supporting structures, unspecified: Secondary | ICD-10-CM | POA: Insufficient documentation

## 2015-07-19 DIAGNOSIS — Z36 Encounter for antenatal screening of mother: Secondary | ICD-10-CM | POA: Diagnosis not present

## 2015-07-19 DIAGNOSIS — Z8759 Personal history of other complications of pregnancy, childbirth and the puerperium: Secondary | ICD-10-CM

## 2015-07-19 LAB — POCT URINALYSIS DIPSTICK
Blood, UA: NEGATIVE
Glucose, UA: NEGATIVE
KETONES UA: NEGATIVE
Leukocytes, UA: NEGATIVE
Nitrite, UA: NEGATIVE

## 2015-07-19 NOTE — Progress Notes (Signed)
Low-risk OB appointment [id number] 2535w0d Estimated Date of Delivery: 01/31/16 BP 100/70 mmHg  Pulse 60  Wt 157 lb (71.215 kg)  LMP 04/09/2015  BP, weight, and urine reviewed.  Refer to obstetrical flow sheet for FH & FHR.  No fm yet. Denies cramping, lof, vb, or uti s/s. Pain in Rt breast for a few weeks- reports she doesn't really drink a lot of caffeine, doesn't smoke. Breast exam normal. To let us know if anything changes.  Reviewed today's normal nt u/s. Discussed warning s/s to report. Early 2hr gtt normal. To begin taking daily baby asa starting today d/t h/o GHTN.  Plan:  Continue routine obstetrical care  F/U in 4wks for OB appointment and 2nd IT

## 2015-07-19 NOTE — Progress Notes (Signed)
US 12 wks,measurements c/w dates,crl 57.9 mm,normal ov's bilat,NT 1.3 mm,NB present,ant pl gr 0,pos fht 164 bpm

## 2015-07-19 NOTE — Patient Instructions (Signed)
Begin taking a 81mg baby aspirin daily at 12 weeks of pregnancy to decrease risk of preeclampsia during pregnancy   Second Trimester of Pregnancy The second trimester is from week 13 through week 28, months 4 through 6. The second trimester is often a time when you feel your best. Your body has also adjusted to being pregnant, and you begin to feel better physically. Usually, morning sickness has lessened or quit completely, you may have more energy, and you may have an increase in appetite. The second trimester is also a time when the fetus is growing rapidly. At the end of the sixth month, the fetus is about 9 inches long and weighs about 1 pounds. You will likely begin to feel the baby move (quickening) between 18 and 20 weeks of the pregnancy. BODY CHANGES Your body goes through many changes during pregnancy. The changes vary from woman to woman.   Your weight will continue to increase. You will notice your lower abdomen bulging out.  You may begin to get stretch marks on your hips, abdomen, and breasts.  You may develop headaches that can be relieved by medicines approved by your health care provider.  You may urinate more often because the fetus is pressing on your bladder.  You may develop or continue to have heartburn as a result of your pregnancy.  You may develop constipation because certain hormones are causing the muscles that push waste through your intestines to slow down.  You may develop hemorrhoids or swollen, bulging veins (varicose veins).  You may have back pain because of the weight gain and pregnancy hormones relaxing your joints between the bones in your pelvis and as a result of a shift in weight and the muscles that support your balance.  Your breasts will continue to grow and be tender.  Your gums may bleed and may be sensitive to brushing and flossing.  Dark spots or blotches (chloasma, mask of pregnancy) may develop on your face. This will likely fade after the  baby is born.  A dark line from your belly button to the pubic area (linea nigra) may appear. This will likely fade after the baby is born.  You may have changes in your hair. These can include thickening of your hair, rapid growth, and changes in texture. Some women also have hair loss during or after pregnancy, or hair that feels dry or thin. Your hair will most likely return to normal after your baby is born. WHAT TO EXPECT AT YOUR PRENATAL VISITS During a routine prenatal visit:  You will be weighed to make sure you and the fetus are growing normally.  Your blood pressure will be taken.  Your abdomen will be measured to track your baby's growth.  The fetal heartbeat will be listened to.  Any test results from the previous visit will be discussed. Your health care provider may ask you:  How you are feeling.  If you are feeling the baby move.  If you have had any abnormal symptoms, such as leaking fluid, bleeding, severe headaches, or abdominal cramping.  If you are using any tobacco products, including cigarettes, chewing tobacco, and electronic cigarettes.  If you have any questions. Other tests that may be performed during your second trimester include:  Blood tests that check for:  Low iron levels (anemia).  Gestational diabetes (between 24 and 28 weeks).  Rh antibodies.  Urine tests to check for infections, diabetes, or protein in the urine.  An ultrasound to confirm the proper   growth and development of the baby.  An amniocentesis to check for possible genetic problems.  Fetal screens for spina bifida and Down syndrome.  HIV (human immunodeficiency virus) testing. Routine prenatal testing includes screening for HIV, unless you choose not to have this test. HOME CARE INSTRUCTIONS   Avoid all smoking, herbs, alcohol, and unprescribed drugs. These chemicals affect the formation and growth of the baby.  Do not use any tobacco products, including cigarettes,  chewing tobacco, and electronic cigarettes. If you need help quitting, ask your health care provider. You may receive counseling support and other resources to help you quit.  Follow your health care provider's instructions regarding medicine use. There are medicines that are either safe or unsafe to take during pregnancy.  Exercise only as directed by your health care provider. Experiencing uterine cramps is a good sign to stop exercising.  Continue to eat regular, healthy meals.  Wear a good support bra for breast tenderness.  Do not use hot tubs, steam rooms, or saunas.  Wear your seat belt at all times when driving.  Avoid raw meat, uncooked cheese, cat litter boxes, and soil used by cats. These carry germs that can cause birth defects in the baby.  Take your prenatal vitamins.  Take 1500-2000 mg of calcium daily starting at the 20th week of pregnancy until you deliver your baby.  Try taking a stool softener (if your health care provider approves) if you develop constipation. Eat more high-fiber foods, such as fresh vegetables or fruit and whole grains. Drink plenty of fluids to keep your urine clear or pale yellow.  Take warm sitz baths to soothe any pain or discomfort caused by hemorrhoids. Use hemorrhoid cream if your health care provider approves.  If you develop varicose veins, wear support hose. Elevate your feet for 15 minutes, 3-4 times a day. Limit salt in your diet.  Avoid heavy lifting, wear low heel shoes, and practice good posture.  Rest with your legs elevated if you have leg cramps or low back pain.  Visit your dentist if you have not gone yet during your pregnancy. Use a soft toothbrush to brush your teeth and be gentle when you floss.  A sexual relationship may be continued unless your health care provider directs you otherwise.  Continue to go to all your prenatal visits as directed by your health care provider. SEEK MEDICAL CARE IF:   You have  dizziness.  You have mild pelvic cramps, pelvic pressure, or nagging pain in the abdominal area.  You have persistent nausea, vomiting, or diarrhea.  You have a bad smelling vaginal discharge.  You have pain with urination. SEEK IMMEDIATE MEDICAL CARE IF:   You have a fever.  You are leaking fluid from your vagina.  You have spotting or bleeding from your vagina.  You have severe abdominal cramping or pain.  You have rapid weight gain or loss.  You have shortness of breath with chest pain.  You notice sudden or extreme swelling of your face, hands, ankles, feet, or legs.  You have not felt your baby move in over an hour.  You have severe headaches that do not go away with medicine.  You have vision changes.   This information is not intended to replace advice given to you by your health care provider. Make sure you discuss any questions you have with your health care provider.   Document Released: 03/20/2001 Document Revised: 04/16/2014 Document Reviewed: 05/27/2012 Elsevier Interactive Patient Education 2016 Elsevier Inc.  

## 2015-07-21 LAB — MATERNAL SCREEN, INTEGRATED #1
Crown Rump Length: 57.9 mm
Gest. Age on Collection Date: 12.3 weeks
Maternal Age at EDD: 35.9 years
NUCHAL TRANSLUCENCY (NT): 1.3 mm
Number of Fetuses: 1
PAPP-A VALUE: 465.5 ng/mL
Weight: 157 [lb_av]

## 2015-08-08 ENCOUNTER — Inpatient Hospital Stay (HOSPITAL_COMMUNITY)
Admission: AD | Admit: 2015-08-08 | Discharge: 2015-08-08 | Disposition: A | Discharge status: Home / Self Care | Payer: Medicaid Other | Source: Ambulatory Visit | Attending: Family Medicine | Admitting: Family Medicine

## 2015-08-08 ENCOUNTER — Other Ambulatory Visit: Payer: Self-pay | Admitting: Women's Health

## 2015-08-08 ENCOUNTER — Encounter (HOSPITAL_COMMUNITY): Payer: Self-pay | Admitting: *Deleted

## 2015-08-08 ENCOUNTER — Encounter: Payer: Medicaid Other | Admitting: Obstetrics & Gynecology

## 2015-08-08 DIAGNOSIS — F418 Other specified anxiety disorders: Secondary | ICD-10-CM

## 2015-08-08 DIAGNOSIS — E119 Type 2 diabetes mellitus without complications: Secondary | ICD-10-CM | POA: Insufficient documentation

## 2015-08-08 DIAGNOSIS — Z87891 Personal history of nicotine dependence: Secondary | ICD-10-CM | POA: Diagnosis not present

## 2015-08-08 DIAGNOSIS — O24312 Unspecified pre-existing diabetes mellitus in pregnancy, second trimester: Secondary | ICD-10-CM | POA: Insufficient documentation

## 2015-08-08 DIAGNOSIS — R202 Paresthesia of skin: Secondary | ICD-10-CM

## 2015-08-08 DIAGNOSIS — O99342 Other mental disorders complicating pregnancy, second trimester: Secondary | ICD-10-CM

## 2015-08-08 DIAGNOSIS — R1011 Right upper quadrant pain: Secondary | ICD-10-CM | POA: Insufficient documentation

## 2015-08-08 DIAGNOSIS — Z3A14 14 weeks gestation of pregnancy: Secondary | ICD-10-CM | POA: Diagnosis not present

## 2015-08-08 DIAGNOSIS — Z3492 Encounter for supervision of normal pregnancy, unspecified, second trimester: Secondary | ICD-10-CM

## 2015-08-08 DIAGNOSIS — O26892 Other specified pregnancy related conditions, second trimester: Secondary | ICD-10-CM | POA: Insufficient documentation

## 2015-08-08 DIAGNOSIS — F419 Anxiety disorder, unspecified: Secondary | ICD-10-CM | POA: Insufficient documentation

## 2015-08-08 DIAGNOSIS — F411 Generalized anxiety disorder: Secondary | ICD-10-CM

## 2015-08-08 DIAGNOSIS — R200 Anesthesia of skin: Secondary | ICD-10-CM | POA: Insufficient documentation

## 2015-08-08 DIAGNOSIS — I4510 Unspecified right bundle-branch block: Secondary | ICD-10-CM

## 2015-08-08 DIAGNOSIS — K30 Functional dyspepsia: Secondary | ICD-10-CM

## 2015-08-08 DIAGNOSIS — R9431 Abnormal electrocardiogram [ECG] [EKG]: Secondary | ICD-10-CM

## 2015-08-08 LAB — CBC WITH DIFFERENTIAL/PLATELET
Basophils Absolute: 0 10*3/uL (ref 0.0–0.1)
Basophils Relative: 0 %
EOS PCT: 2 %
Eosinophils Absolute: 0.1 10*3/uL (ref 0.0–0.7)
HCT: 33.6 % — ABNORMAL LOW (ref 36.0–46.0)
Hemoglobin: 11.7 g/dL — ABNORMAL LOW (ref 12.0–15.0)
LYMPHS ABS: 1.5 10*3/uL (ref 0.7–4.0)
LYMPHS PCT: 19 %
MCH: 29.7 pg (ref 26.0–34.0)
MCHC: 34.8 g/dL (ref 30.0–36.0)
MCV: 85.3 fL (ref 78.0–100.0)
Monocytes Absolute: 0.5 10*3/uL (ref 0.1–1.0)
Monocytes Relative: 6 %
Neutro Abs: 5.6 10*3/uL (ref 1.7–7.7)
Neutrophils Relative %: 73 %
PLATELETS: 237 10*3/uL (ref 150–400)
RBC: 3.94 MIL/uL (ref 3.87–5.11)
RDW: 13.2 % (ref 11.5–15.5)
WBC: 7.7 10*3/uL (ref 4.0–10.5)

## 2015-08-08 LAB — URINALYSIS, ROUTINE W REFLEX MICROSCOPIC
Bilirubin Urine: NEGATIVE
GLUCOSE, UA: NEGATIVE mg/dL
HGB URINE DIPSTICK: NEGATIVE
Ketones, ur: NEGATIVE mg/dL
Leukocytes, UA: NEGATIVE
Nitrite: NEGATIVE
PH: 8 (ref 5.0–8.0)
Protein, ur: NEGATIVE mg/dL
SPECIFIC GRAVITY, URINE: 1.02 (ref 1.005–1.030)

## 2015-08-08 NOTE — Discharge Instructions (Signed)
Heartburn During Pregnancy  Heartburn happens when stomach acid goes up into the esophagus. The esophagus is the tube between the mouth and the stomach. This acid causes a burning pain in the chest or throat. This happens more often in the later part of pregnancy because the womb (uterus) gets larger. It may also happen because of hormone changes. Heartburn problems often go away after giving birth. HOME CARE  Take all medicine as told by your doctor.  Raise the head of your bed with blocks only as told by your doctor.  Do not exercise right after eating.  Avoid eating 2-3 hours before bed. Do not lie down right after eating.  Eat small meals throughout the day instead of 3 large meals.  Avoid foods that give you heartburn. Foods you may want to avoid include:  Peppers.  Chocolate.  High-fat foods, including fried foods.  Spicy foods.  Garlic and onions.  Citrus fruits, including oranges, grapefruit, lemons, and limes.  Food containing tomatoes or tomato products.  Mint.  Bubbly (carbonated) drinks and drinks with caffeine.  Vinegar. GET HELP IF:  You have any belly (abdominal) pain.  You feel burning in your upper belly or chest, especially after eating or lying down.  You feel sick to your stomach (nauseous) and throw up (vomit).  Your stomach feels upset after you eat. GET HELP RIGHT AWAY IF:  You have bad chest pain that goes down your arm or into your jaw or neck.  You feel sweaty, dizzy, or light-headed.  You have trouble breathing.  You throw up blood.  You have trouble or pain when swallowing.  You have bloody or black poop (stool).  You have heartburn more than 3 times a week, for more than 2 weeks. MAKE SURE YOU:  Understand these instructions.  Will watch your condition.  Will get help right away if you are not doing well or get worse.   This information is not intended to replace advice given to you by your health care provider. Make sure  you discuss any questions you have with your health care provider.   Document Released: 04/28/2010 Document Revised: 03/31/2013 Document Reviewed: 11/12/2012 Elsevier Interactive Patient Education 2016 Elsevier Inc.  GO TO EMERGENCY DEPARTMENT IF NUMBNESS WORSENS

## 2015-08-08 NOTE — MAU Note (Signed)
Pt reports history of taking Celexa for anxiety, has not been able to take due to morning sickness.

## 2015-08-08 NOTE — Progress Notes (Unsigned)
Received call from Jeani SowEve Key, NP at [facility], pt presented today at 732w6d pregnant w/ report of Lt sided numbness, mild sternal cp, sob, dizziness, ruq pain. EKG revealed Rt bundle branch block along w/ some other abnormalities and MD who read EKG recommended to Eve to have her f/u w/ cardiology. [facility] to [facility] ordered today and called to confirm they received, which they did and state they will call pt w/ appt time/date.  Cheral MarkerKimberly R. Henri Baumler, CNM, WHNP-BC 08/08/2015 1:33 PM

## 2015-08-08 NOTE — MAU Provider Note (Signed)
History     CSN: [id number]  Arrival date and time: 08/08/15 1102   None     No chief complaint on file.  HPI [name] is 35 y.o. [id number] [redacted]w[redacted]d weeks presenting with numbness on the left side involving her tongue, arm and index finger and right toe that began around 8:30  Has not worsen, tongue is less numb.  Neg for vaginal bleeding.  Is having upper right abdominal pain.  Rating 5-6 at present.  Worse "the other day"  Having a little morning sickness, a lot of indigestion.  Mid sternal chest pain, dizziness and gets short of breath if she moves around too much.  Hx of anxiety, Rx'd Celexa but has not been able to take because of morning sickness.   Negative for imbalance, falling, tripping.     Past Medical History  Diagnosis Date  . Complication of anesthesia     Epidural 1 sided  . Headache(784.0)   . Depression     During 1 pregnancy  . Irregular periods   . Recurrent upper respiratory infection (URI)     Mostly when pregnant  . Diabetes mellitus without complication (HCC)   . Gestational diabetes     just started checking BS 03/05/12  . Pregnancy induced hypertension   . Anemia   . Anxiety "fast hearbeat"    took Benadryl at end of pregnancy  . Pregnant 01/27/2015  . Nausea 01/27/2015  . Spotting affecting pregnancy in first trimester 01/27/2015  . Hematuria 05/27/2015  . Back pain affecting pregnancy in first trimester 05/27/2015    Past Surgical History  Procedure Laterality Date  . Wisdom tooth extraction    . Multiple tooth extractions      Family History  Problem Relation Age of Onset  . Heart disease Maternal Grandmother   . Cancer Maternal Grandmother     brain  . Heart failure Maternal Grandmother   . Cancer Paternal Grandmother     brain  . Hypertension Mother   . Anxiety disorder Mother   . Depression Mother   . Hypertension Father   . Anxiety disorder Sister   . ADD / ADHD Daughter   . Asthma Daughter   . ADD / ADHD Daughter   .  Seizures Daughter   . ADD / ADHD Daughter   . ADD / ADHD Daughter     Social History  Substance Use Topics  . Smoking status: Former Smoker -- .5 years    Types: Cigarettes    Quit date: 09/17/1998  . Smokeless tobacco: Never Used  . Alcohol Use: No     Comment: not while pregnant    Allergies: No Known Allergies  Prescriptions prior to admission  Medication Sig Dispense Refill Last Dose  . acetaminophen (TYLENOL) 325 MG tablet Take 650 mg by mouth every 6 (six) hours as needed for mild pain or headache.    prn  . fluticasone (FLONASE) 50 MCG/ACT nasal spray Place 1 spray into both nostrils daily.   08/07/2015 at Unknown time  . loratadine (CLARITIN) 10 MG tablet Take 10 mg by mouth daily as needed for allergies.   08/07/2015 at Unknown time  . Polyethyl Glycol-Propyl Glycol (SYSTANE ULTRA) 0.4-0.3 % SOLN Apply 1 drop to eye 2 (two) times daily as needed (for dryness).   08/07/2015 at Unknown time  . Prenatal Vit-Fe Fumarate-FA (PRENATAL MULTIVITAMIN) TABS tablet Take 1 tablet by mouth daily.   Past Month at Unknown time  . promethazine (PHENERGAN) 6.25 MG/5ML  syrup 12.5-25mg  every 6 hours as needed for nausea and or vomiting 120 mL 0 Taking    Review of Systems  Constitutional: Negative for fever and chills.  Cardiovascular: Negative for chest pain.  Gastrointestinal: Positive for heartburn, nausea, vomiting and abdominal pain (upper right abdominal pain).  Genitourinary: Negative for dysuria, urgency, frequency and hematuria.       Neg for vaginal discharge or bleeding  Neurological: Positive for dizziness. Negative for headaches.       Numbness left side of tongue (improving), left arm and index finger; left toes.  Psychiatric/Behavioral: Nervous/anxious: untreated this time because of morning sickness.    Physical Exam   Blood pressure 116/84, pulse 94, temperature 98.7 F (37.1 C), temperature source Oral, resp. rate 20, weight 156 lb 3.2 oz (70.852 kg), last menstrual  period 04/09/2015, SpO2 99 %.  Physical Exam  Constitutional: She is oriented to person, place, and time. She appears well-developed and well-nourished. No distress.  Is not acute appearing  HENT:  Head: Normocephalic.  Neck: Normal range of motion.  Cardiovascular: Normal rate and regular rhythm.   Respiratory: Effort normal.  GI: There is tenderness (mild tenderness in URQ). There is no rebound and no guarding.  Genitourinary:  Deferred.  FHR by doppler 150  Neurological: She is alert and oriented to person, place, and time. No cranial nerve deficit. She exhibits normal muscle tone. Coordination normal.  Skin: Skin is warm and dry.  Psychiatric: She has a normal mood and affect. Her behavior is normal. Thought content normal.    Results for orders placed or performed during the hospital encounter of 08/08/15 (from the past 24 hour(s))  Urinalysis, Routine w reflex microscopic (not at [facility])     Status: Abnormal   Collection Time: 08/08/15 12:15 PM  Result Value Ref Range   Color, Urine YELLOW YELLOW   APPearance CLOUDY (A) CLEAR   Specific Gravity, Urine 1.020 1.005 - 1.030   pH 8.0 5.0 - 8.0   Glucose, UA NEGATIVE NEGATIVE mg/dL   Hgb urine dipstick NEGATIVE NEGATIVE   Bilirubin Urine NEGATIVE NEGATIVE   Ketones, ur NEGATIVE NEGATIVE mg/dL   Protein, ur NEGATIVE NEGATIVE mg/dL   Nitrite NEGATIVE NEGATIVE   Leukocytes, UA NEGATIVE NEGATIVE  CBC with Differential/Platelet     Status: Abnormal   Collection Time: 08/08/15  1:10 PM  Result Value Ref Range   WBC 7.7 4.0 - 10.5 K/uL   RBC 3.94 3.87 - 5.11 MIL/uL   Hemoglobin 11.7 (L) 12.0 - 15.0 g/dL   HCT 40.9 (L) 81.1 - 91.4 %   MCV 85.3 78.0 - 100.0 fL   MCH 29.7 26.0 - 34.0 pg   MCHC 34.8 30.0 - 36.0 g/dL   RDW 78.2 95.6 - 21.3 %   Platelets 237 150 - 400 K/uL   Neutrophils Relative % 73 %   Neutro Abs 5.6 1.7 - 7.7 K/uL   Lymphocytes Relative 19 %   Lymphs Abs 1.5 0.7 - 4.0 K/uL   Monocytes Relative 6 %   Monocytes  Absolute 0.5 0.1 - 1.0 K/uL   Eosinophils Relative 2 %   Eosinophils Absolute 0.1 0.0 - 0.7 K/uL   Basophils Relative 0 %   Basophils Absolute 0.0 0.0 - 0.1 K/uL   MAU Course  Procedures  MDM MSE Exam Lab EKG--Sinus rhythm with fusion complexes.  ST&T wave abnormality, consider anterior ischemia,   Consultation with Attending and Cardiologist:  Discussed/consulted EKG results with Dr. Adrian Blackwater.  Instructed to discuss with  cardiologist.  Spoke with Cardiologist on call.  He reports she has a Right Bundle Branch Block and "pre-excitation".  Suggested cardiology referral.  I called [facility] and spoke to Joellyn HaffKim Booker, [facility] and she will make referral for [place].    Assessment and Plan  A:  Left sided numbness       Right upper abdominal pain       6617w6d gestation        Anxiety, untreated   P: Discuss anxiety with patient and encouraged her to take with OB regarding restarting medication.            They may want to use a different medication, talk with them first       Encouraged non spicy, non fried foods to see if that would decrease indigestion       REPORT any increase of numbness sxs and go to ED if needed       Continue prenatal vitamins.   Seab Axel,EVE M 08/08/2015, 1:39 PM

## 2015-08-08 NOTE — MAU Note (Signed)
Pt complains of chest pain, heavy and sharp pains, SOB at times rates pain at 5 .Dizzy at times. Left big toe feels num, and left hand feels. Pt reports N/V with pregnancy. No vomit today. Denies bleeding and discharge.

## 2015-08-14 ENCOUNTER — Emergency Department (HOSPITAL_COMMUNITY)
Admission: EM | Admit: 2015-08-14 | Discharge: 2015-08-14 | Disposition: A | Discharge status: Home / Self Care | Payer: Medicaid Other | Attending: Emergency Medicine | Admitting: Emergency Medicine

## 2015-08-14 ENCOUNTER — Emergency Department (HOSPITAL_COMMUNITY): Payer: Medicaid Other

## 2015-08-14 ENCOUNTER — Encounter (HOSPITAL_COMMUNITY): Payer: Self-pay

## 2015-08-14 DIAGNOSIS — R110 Nausea: Secondary | ICD-10-CM | POA: Diagnosis not present

## 2015-08-14 DIAGNOSIS — F329 Major depressive disorder, single episode, unspecified: Secondary | ICD-10-CM | POA: Diagnosis not present

## 2015-08-14 DIAGNOSIS — R072 Precordial pain: Secondary | ICD-10-CM | POA: Diagnosis not present

## 2015-08-14 DIAGNOSIS — Z87891 Personal history of nicotine dependence: Secondary | ICD-10-CM | POA: Diagnosis not present

## 2015-08-14 DIAGNOSIS — Z79899 Other long term (current) drug therapy: Secondary | ICD-10-CM | POA: Diagnosis not present

## 2015-08-14 DIAGNOSIS — R079 Chest pain, unspecified: Secondary | ICD-10-CM

## 2015-08-14 DIAGNOSIS — E119 Type 2 diabetes mellitus without complications: Secondary | ICD-10-CM | POA: Diagnosis not present

## 2015-08-14 LAB — CBC
HCT: 34.1 % — ABNORMAL LOW (ref 36.0–46.0)
HEMOGLOBIN: 11.8 g/dL — AB (ref 12.0–15.0)
MCH: 30 pg (ref 26.0–34.0)
MCHC: 34.6 g/dL (ref 30.0–36.0)
MCV: 86.8 fL (ref 78.0–100.0)
Platelets: 230 10*3/uL (ref 150–400)
RBC: 3.93 MIL/uL (ref 3.87–5.11)
RDW: 13.2 % (ref 11.5–15.5)
WBC: 7 10*3/uL (ref 4.0–10.5)

## 2015-08-14 LAB — COMPREHENSIVE METABOLIC PANEL
ALBUMIN: 3.3 g/dL — AB (ref 3.5–5.0)
ALK PHOS: 32 U/L — AB (ref 38–126)
ALT: 12 U/L — AB (ref 14–54)
ANION GAP: 8 (ref 5–15)
AST: 13 U/L — ABNORMAL LOW (ref 15–41)
BUN: 7 mg/dL (ref 6–20)
CALCIUM: 9.5 mg/dL (ref 8.9–10.3)
CO2: 23 mmol/L (ref 22–32)
CREATININE: 0.43 mg/dL — AB (ref 0.44–1.00)
Chloride: 105 mmol/L (ref 101–111)
GFR calc Af Amer: >60 mL/min (ref >60)
GFR calc non Af Amer: >60 mL/min (ref >60)
GLUCOSE: 102 mg/dL — AB (ref 65–99)
Potassium: 3.4 mmol/L — ABNORMAL LOW (ref 3.5–5.1)
SODIUM: 136 mmol/L (ref 135–145)
Total Bilirubin: 0.3 mg/dL (ref 0.3–1.2)
Total Protein: 6.9 g/dL (ref 6.5–8.1)

## 2015-08-14 LAB — TROPONIN I: Troponin I: <0.03 ng/mL (ref <0.031)

## 2015-08-14 MED ORDER — SODIUM CHLORIDE 0.9 % IV BOLUS (SEPSIS)
1000.0000 mL | Freq: Once | INTRAVENOUS | Status: AC
  Administered 2015-08-14: 1000 mL via INTRAVENOUS

## 2015-08-14 NOTE — ED Provider Notes (Signed)
CSN: [id number]     Arrival date & time 08/14/15  1259 History   First MD Initiated Contact with Patient 08/14/15 1318     Chief Complaint  Patient presents with  . Chest Pain     (Consider location/radiation/quality/duration/timing/severity/associated sxs/prior Treatment) Patient is a 35 y.o. female presenting with chest pain.  Chest Pain Pain location:  R chest and substernal area Pain quality: aching, pressure and sharp   Pain radiates to:  Does not radiate Pain severity:  No pain Onset quality:  Gradual Timing:  Intermittent Chronicity:  Recurrent Relieved by:  None tried Associated symptoms: dizziness, nausea and palpitations   Associated symptoms: no back pain     Past Medical History  Diagnosis Date  . Complication of anesthesia     Epidural 1 sided  . Headache(784.0)   . Depression     During 1 pregnancy  . Irregular periods   . Recurrent upper respiratory infection (URI)     Mostly when pregnant  . Diabetes mellitus without complication (HCC)   . Gestational diabetes     just started checking BS 03/05/12  . Pregnancy induced hypertension   . Anemia   . Anxiety "fast hearbeat"    took Benadryl at end of pregnancy  . Pregnant 01/27/2015  . Nausea 01/27/2015  . Spotting affecting pregnancy in first trimester 01/27/2015  . Hematuria 05/27/2015  . Back pain affecting pregnancy in first trimester 05/27/2015   Past Surgical History  Procedure Laterality Date  . Wisdom tooth extraction    . Multiple tooth extractions     Family History  Problem Relation Age of Onset  . Heart disease Maternal Grandmother   . Cancer Maternal Grandmother     brain  . Heart failure Maternal Grandmother   . Cancer Paternal Grandmother     brain  . Hypertension Mother   . Anxiety disorder Mother   . Depression Mother   . Hypertension Father   . Anxiety disorder Sister   . ADD / ADHD Daughter   . Asthma Daughter   . ADD / ADHD Daughter   . Seizures Daughter   . ADD / ADHD  Daughter   . ADD / ADHD Daughter    Social History  Substance Use Topics  . Smoking status: Former Smoker -- .5 years    Types: Cigarettes    Quit date: 09/17/1998  . Smokeless tobacco: Never Used  . Alcohol Use: No     Comment: not while pregnant   OB History    Gravida Para Term Preterm AB TAB SAB Ectopic Multiple Living   8 5 5  0 2  2   5      Review of Systems  HENT: Negative for drooling and ear pain.   Cardiovascular: Positive for chest pain and palpitations. Negative for leg swelling.  Gastrointestinal: Positive for nausea.  Musculoskeletal: Negative for back pain.  Neurological: Positive for dizziness.  Psychiatric/Behavioral: Negative for confusion and agitation. The patient is nervous/anxious. The patient is not hyperactive.   All other systems reviewed and are negative.     Allergies  Review of patient's allergies indicates no known allergies.  Home Medications   Prior to Admission medications   Medication Sig Start Date End Date Taking? Authorizing Provider  acetaminophen (TYLENOL) 325 MG tablet Take 650 mg by mouth every 6 (six) hours as needed for mild pain or headache.    Yes Historical Provider, MD  citalopram (CELEXA) 10 MG tablet Take 10 mg by mouth daily.  Yes Historical Provider, MD  fluticasone (FLONASE) 50 MCG/ACT nasal spray Place 1 spray into both nostrils daily.   Yes Historical Provider, MD  loratadine (CLARITIN) 10 MG tablet Take 10 mg by mouth at bedtime.    Yes Historical Provider, MD  Polyethyl Glycol-Propyl Glycol (SYSTANE ULTRA) 0.4-0.3 % SOLN Apply 1 drop to eye 2 (two) times daily as needed (for dryness).   Yes Historical Provider, MD  Prenatal Vit-Fe Fumarate-FA (PRENATAL MULTIVITAMIN) TABS tablet Take 1 tablet by mouth daily.   Yes Historical Provider, MD  promethazine (PHENERGAN) 6.25 MG/5ML syrup 12.5-25mg  every 6 hours as needed for nausea and or vomiting 06/21/15  Yes Cheral Marker, CNM   BP 97/59 mmHg  Pulse 89  Temp(Src) 98 F  (36.7 C) (Oral)  Resp 17  Wt 156 lb (70.761 kg)  SpO2 98%  LMP 04/09/2015 Physical Exam  Constitutional: She appears well-developed and well-nourished.  HENT:  Head: Normocephalic and atraumatic.  Neck: Normal range of motion.  Cardiovascular: Normal rate and regular rhythm.   Pulmonary/Chest: No stridor. No respiratory distress.  Abdominal: She exhibits no distension.  Neurological: She is alert.  Nursing note and vitals reviewed.   ED Course  Procedures (including critical care time) Labs Review Labs Reviewed  CBC - Abnormal; Notable for the following:    Hemoglobin 11.8 (*)    HCT 34.1 (*)    All other components within normal limits  COMPREHENSIVE METABOLIC PANEL - Abnormal; Notable for the following:    Potassium 3.4 (*)    Glucose, Bld 102 (*)    Creatinine, Ser 0.43 (*)    Albumin 3.3 (*)    AST 13 (*)    ALT 12 (*)    Alkaline Phosphatase 32 (*)    All other components within normal limits  TROPONIN I    Imaging Review No results found. I have personally reviewed and evaluated these images and lab results as part of my medical decision-making.   EKG Interpretation   Date/Time:  Sunday Aug 14 2015 13:11:34 EDT Ventricular Rate:  99 PR Interval:  153 QRS Duration: 84 QT Interval:  340 QTC Calculation: 436 R Axis:   57 Text Interpretation:  Sinus tachycardia Ventricular premature complex No  significant change since last tracing Confirmed by BEATON  MD, ROBERT  ([id number]) on 08/14/2015 1:15:53 PM      MDM   Final diagnoses:  Chest pain, unspecified chest pain type   Reproducible CP similar to previous anxiety, likely same. Does have some component of tightness, will check troponin.  Low Wells score, doubt PE.  No rash to suggest infection. No recurrent symptoms. Labs unremarkable. Will continue to follow up with cardiologist on Wednesday as planned.   New Prescriptions: Discharge Medication List as of 08/14/2015  4:01 PM     I have personally and  contemperaneously reviewed labs and imaging and used in my decision making as above.   A medical screening exam was performed and I feel the patient has had an appropriate workup for their chief complaint at this time and likelihood of emergent condition existing is low and thus workup can continue on an outpatient basis.. Their vital signs are stable. They have been counseled on decision, discharge, follow up and which symptoms necessitate immediate return to the emergency department.  They verbally stated understanding and agreement with plan and discharged in stable condition.      Marily Memos, MD 08/14/15 1850

## 2015-08-14 NOTE — ED Notes (Signed)
Patient verbalizes understanding of discharge instructions, home care and follow up care. Patient out of department at this time with family. 

## 2015-08-14 NOTE — ED Notes (Signed)
C/o substernal chest pain that started while eating today. C/o blurred vision and dizziness as well. A&OX4 [redacted]weeks pregnant per patient.

## 2015-08-14 NOTE — ED Notes (Signed)
Patient states she has this recurrent chest pain and was followed by PCP and referred to cardiologist. Patient states she has a history of Anxiety and her PCP has encouraged her to start taking her medication again. Patient states she has not taken her medication for anxiety.

## 2015-08-16 ENCOUNTER — Ambulatory Visit (INDEPENDENT_AMBULATORY_CARE_PROVIDER_SITE_OTHER): Payer: Medicaid Other | Admitting: Women's Health

## 2015-08-16 ENCOUNTER — Encounter: Payer: Self-pay | Admitting: Women's Health

## 2015-08-16 VITALS — BP 108/68 | HR 76 | Wt 158.0 lb

## 2015-08-16 DIAGNOSIS — O210 Mild hyperemesis gravidarum: Secondary | ICD-10-CM

## 2015-08-16 DIAGNOSIS — Z1389 Encounter for screening for other disorder: Secondary | ICD-10-CM

## 2015-08-16 DIAGNOSIS — O09522 Supervision of elderly multigravida, second trimester: Secondary | ICD-10-CM

## 2015-08-16 DIAGNOSIS — O2692 Pregnancy related conditions, unspecified, second trimester: Secondary | ICD-10-CM | POA: Diagnosis not present

## 2015-08-16 DIAGNOSIS — Z331 Pregnant state, incidental: Secondary | ICD-10-CM | POA: Diagnosis not present

## 2015-08-16 DIAGNOSIS — Z3A16 16 weeks gestation of pregnancy: Secondary | ICD-10-CM

## 2015-08-16 DIAGNOSIS — Z3682 Encounter for antenatal screening for nuchal translucency: Secondary | ICD-10-CM

## 2015-08-16 DIAGNOSIS — O0992 Supervision of high risk pregnancy, unspecified, second trimester: Secondary | ICD-10-CM

## 2015-08-16 DIAGNOSIS — Z3492 Encounter for supervision of normal pregnancy, unspecified, second trimester: Secondary | ICD-10-CM

## 2015-08-16 DIAGNOSIS — Z363 Encounter for antenatal screening for malformations: Secondary | ICD-10-CM

## 2015-08-16 LAB — POCT URINALYSIS DIPSTICK
GLUCOSE UA: NEGATIVE
KETONES UA: NEGATIVE
Nitrite, UA: NEGATIVE

## 2015-08-16 NOTE — Patient Instructions (Addendum)
Begin taking a 81mg  baby aspirin daily to decrease risk of preeclampsia during pregnancy   For Dizzy Spells:   This is usually related to either your blood sugar or your blood pressure dropping  Make sure you are staying well hydrated and drinking enough water so that your urine is clear  Eat small frequent meals and snacks containing protein (meat, eggs, nuts, cheese) so that your blood sugar doesn't drop  If you do get dizzy, sit/lay down and get you something to drink and a snack containing protein- you will usually start feeling better in 10-20 minutes  Nausea & Vomiting  Have saltine crackers or pretzels by your bed and eat a few bites before you raise your head out of bed in the morning  Eat small frequent meals throughout the day instead of large meals  Drink plenty of fluids throughout the day to stay hydrated, just don't drink a lot of fluids with your meals.  This can make your stomach fill up faster making you feel sick  Do not brush your teeth right after you eat  Products with real ginger are good for nausea, like ginger ale and ginger hard candy Make sure it says made with real ginger!  Sucking on sour candy like lemon heads is also good for nausea  If your prenatal vitamins make you nauseated, take them at night so you will sleep through the nausea  Sea Bands  If you feel like you need medicine for the nausea & vomiting please let us know  If you are unable to keep any fluids or food down please let us know      Second Trimester of Pregnancy The second trimester is from week 13 through week 28, months 4 through 6. The second trimester is often a time when you feel your best. Your body has also adjusted to being pregnant, and you begin to feel better physically. Usually, morning sickness has lessened or quit completely, you may have more energy, and you may have an increase in appetite. The second trimester is also a time when the fetus is growing rapidly. At the  end of the sixth month, the fetus is about 9 inches long and weighs about 1 pounds. You will likely begin to feel the baby move (quickening) between 18 and 20 weeks of the pregnancy. BODY CHANGES Your body goes through many changes during pregnancy. The changes vary from woman to woman.  15. Your weight will continue to increase. You will notice your lower abdomen bulging out. 16. You may begin to get stretch marks on your hips, abdomen, and breasts. 17. You may develop headaches that can be relieved by medicines approved by your health care provider. 18. You may urinate more often because the fetus is pressing on your bladder. 19. You may develop or continue to have heartburn as a result of your pregnancy. 20. You may develop constipation because certain hormones are causing the muscles that push waste through your intestines to slow down. 21. You may develop hemorrhoids or swollen, bulging veins (varicose veins). 22. You may have back pain because of the weight gain and pregnancy hormones relaxing your joints between the bones in your pelvis and as a result of a shift in weight and the muscles that support your balance. 23. Your breasts will continue to grow and be tender. 24. Your gums may bleed and may be sensitive to brushing and flossing. 25. Dark spots or blotches (chloasma, mask of pregnancy) may develop on your  face. This will likely fade after the baby is born. 26. A dark line from your belly button to the pubic area (linea nigra) may appear. This will likely fade after the baby is born. 27. You may have changes in your hair. These can include thickening of your hair, rapid growth, and changes in texture. Some women also have hair loss during or after pregnancy, or hair that feels dry or thin. Your hair will most likely return to normal after your baby is born. WHAT TO EXPECT AT YOUR PRENATAL VISITS During a routine prenatal visit:  You will be weighed to make sure you and the fetus are  growing normally.  Your blood pressure will be taken.  Your abdomen will be measured to track your baby's growth.  The fetal heartbeat will be listened to.  Any test results from the previous visit will be discussed. Your health care provider may ask you:  How you are feeling.  If you are feeling the baby move.  If you have had any abnormal symptoms, such as leaking fluid, bleeding, severe headaches, or abdominal cramping.  If you are using any tobacco products, including cigarettes, chewing tobacco, and electronic cigarettes.  If you have any questions. Other tests that may be performed during your second trimester include:  Blood tests that check for:  Low iron levels (anemia).  Gestational diabetes (between 24 and 28 weeks).  Rh antibodies.  Urine tests to check for infections, diabetes, or protein in the urine.  An ultrasound to confirm the proper growth and development of the baby.  An amniocentesis to check for possible genetic problems.  Fetal screens for spina bifida and Down syndrome.  HIV (human immunodeficiency virus) testing. Routine prenatal testing includes screening for HIV, unless you choose not to have this test. HOME CARE INSTRUCTIONS   Avoid all smoking, herbs, alcohol, and unprescribed drugs. These chemicals affect the formation and growth of the baby.  Do not use any tobacco products, including cigarettes, chewing tobacco, and electronic cigarettes. If you need help quitting, ask your health care provider. You may receive counseling support and other resources to help you quit.  Follow your health care provider's instructions regarding medicine use. There are medicines that are either safe or unsafe to take during pregnancy.  Exercise only as directed by your health care provider. Experiencing uterine cramps is a good sign to stop exercising.  Continue to eat regular, healthy meals.  Wear a good support bra for breast tenderness.  Do not use  hot tubs, steam rooms, or saunas.  Wear your seat belt at all times when driving.  Avoid raw meat, uncooked cheese, cat litter boxes, and soil used by cats. These carry germs that can cause birth defects in the baby.  Take your prenatal vitamins.  Take 1500-2000 mg of calcium daily starting at the 20th week of pregnancy until you deliver your baby.  Try taking a stool softener (if your health care provider approves) if you develop constipation. Eat more high-fiber foods, such as fresh vegetables or fruit and whole grains. Drink plenty of fluids to keep your urine clear or pale yellow.  Take warm sitz baths to soothe any pain or discomfort caused by hemorrhoids. Use hemorrhoid cream if your health care provider approves.  If you develop varicose veins, wear support hose. Elevate your feet for 15 minutes, 3-4 times a day. Limit salt in your diet.  Avoid heavy lifting, wear low heel shoes, and practice good posture.  Rest with your  legs elevated if you have leg cramps or low back pain.  Visit your dentist if you have not gone yet during your pregnancy. Use a soft toothbrush to brush your teeth and be gentle when you floss.  A sexual relationship may be continued unless your health care provider directs you otherwise.  Continue to go to all your prenatal visits as directed by your health care provider. SEEK MEDICAL CARE IF:   You have dizziness.  You have mild pelvic cramps, pelvic pressure, or nagging pain in the abdominal area.  You have persistent nausea, vomiting, or diarrhea.  You have a bad smelling vaginal discharge.  You have pain with urination. SEEK IMMEDIATE MEDICAL CARE IF:   You have a fever.  You are leaking fluid from your vagina.  You have spotting or bleeding from your vagina.  You have severe abdominal cramping or pain.  You have rapid weight gain or loss.  You have shortness of breath with chest pain.  You notice sudden or extreme swelling of your  face, hands, ankles, feet, or legs.  You have not felt your baby move in over an hour.  You have severe headaches that do not go away with medicine.  You have vision changes.   This information is not intended to replace advice given to you by your health care provider. Make sure you discuss any questions you have with your health care provider.   Document Released: 03/20/2001 Document Revised: 04/16/2014 Document Reviewed: 05/27/2012 Elsevier Interactive Patient Education 2016 [employer] Inc[employer]  Generalized Anxiety Disorder Generalized anxiety disorder (GAD) is a mental disorder. It interferes with life functions, including relationships, work, and school. GAD is different from normal anxiety, which everyone experiences at some point in their lives in response to specific life events and activities. Normal anxiety actually helps [place] prepare for and get through these life events and activities. Normal anxiety goes away after the event or activity is over.  GAD causes anxiety that is not necessarily related to specific events or activities. It also causes excess anxiety in proportion to specific events or activities. The anxiety associated with GAD is also difficult to control. GAD can vary from mild to severe. People with severe GAD can have intense waves of anxiety with physical symptoms (panic attacks).  SYMPTOMS The anxiety and worry associated with GAD are difficult to control. This anxiety and worry are related to many life events and activities and also occur more days than not for 6 months or longer. People with GAD also have three or more of the following symptoms (one or more in children): 28. Restlessness.  29. Fatigue. 30. Difficulty concentrating.  31. Irritability. 32. Muscle tension. 33. Difficulty sleeping or unsatisfying sleep. DIAGNOSIS GAD is diagnosed through an assessment by your health care provider. Your health care provider will ask you questions aboutyour  mood,physical symptoms, and events in your life. Your health care provider may ask you about your medical history and use of alcohol or drugs, including prescription medicines. Your health care provider may also do a physical exam and blood tests. Certain medical conditions and the use of certain substances can cause symptoms similar to those associated with GAD. Your health care provider may refer you to a mental health specialist for further evaluation. TREATMENT The following therapies are usually used to treat GAD:   Medication. Antidepressant medication usually is prescribed for long-term daily control. Antianxiety medicines may be added in severe cases, especially when panic attacks occur.   Talk therapy (psychotherapy).  Certain types of talk therapy can be helpful in treating GAD by providing support, education, and guidance. A form of talk therapy called cognitive behavioral therapy can teach you healthy ways to think about and react to daily life events and activities.  Stress managementtechniques. These include yoga, meditation, and exercise and can be very helpful when they are practiced regularly. A mental health specialist can help determine which treatment is best for you. Some people see improvement with one therapy. However, other people require a combination of therapies.   This information is not intended to replace advice given to you by your health care provider. Make sure you discuss any questions you have with your health care provider.   Document Released: 07/21/2012 Document Revised: 04/16/2014 Document Reviewed: 07/21/2012 Elsevier Interactive Patient Education [employer]

## 2015-08-16 NOTE — Progress Notes (Signed)
Low-risk OB appointment [id number] Estimated Date of Delivery: 01/31/16 BP 108/68 mmHg  Pulse 76  Wt 158 lb (71.668 kg)  LMP 04/09/2015  BP, weight, and urine reviewed.  Refer to obstetrical flow sheet for FH & FHR.  Starting to feel some fm. Denies cramping, lof, vb, or uti s/s. Dizzy spells- discussed and gave printed prevention/relief measures. Anxiety- hasn't been able to take celexa d/t nausea, did start it again last night, will try again today. Has phenergan rx at home that she says she isn't using- can take this prn to help w/ nausea to be able to keep meds down. Did counseling for anxiety in past, offered again- states once she's able to take celexa as normal she should be fine. Tried baby asa as directed for h/o GHTN but d/t nausea didn't stay down- to try to restart once nausea better. Or can try taking all meds at night.  Went to [facility] 5/1 w/ Lt sided numbness, dizziness, sob, sternal cp, ekg Rt bundle branch block- has cardiology appt tomorrow. Went to [facility] 5/7 w/ sternal cp, ekg w/o changes, labs normal, recommended keeping f/u w/ cards as scheduled.  Reviewed warning s/s to report. Plan:  Continue routine obstetrical care  F/U in 3wks for OB appointment and anatomy u/s 2nd IT today

## 2015-08-17 ENCOUNTER — Ambulatory Visit (INDEPENDENT_AMBULATORY_CARE_PROVIDER_SITE_OTHER): Payer: Medicaid Other | Admitting: Cardiovascular Disease

## 2015-08-17 ENCOUNTER — Encounter: Payer: Self-pay | Admitting: Cardiovascular Disease

## 2015-08-17 VITALS — BP 100/67 | HR 96 | Ht 66.0 in | Wt 159.0 lb

## 2015-08-17 DIAGNOSIS — R079 Chest pain, unspecified: Secondary | ICD-10-CM | POA: Diagnosis not present

## 2015-08-17 DIAGNOSIS — R9431 Abnormal electrocardiogram [ECG] [EKG]: Secondary | ICD-10-CM

## 2015-08-17 DIAGNOSIS — R0602 Shortness of breath: Secondary | ICD-10-CM | POA: Diagnosis not present

## 2015-08-17 NOTE — Patient Instructions (Signed)
Your physician recommends that you schedule a follow-up appointment TO BE DETERMINED AFTER TESTING  Your physician recommends that you continue on your current medications as directed. Please refer to the Current Medication list given to you today.  Your physician has requested that you have an echocardiogram. Echocardiography is a painless test that uses sound waves to create images of your heart. It provides your doctor with information about the size and shape of your heart and how well your heart's chambers and valves are working. This procedure takes approximately one hour. There are no restrictions for this procedure.  Thank you for choosing [facility]!!    

## 2015-08-17 NOTE — Progress Notes (Signed)
Patient ID: [name], female   DOB: Aug 10, 1980, 35 y.o.   MRN: [id number]       CARDIOLOGY CONSULT NOTE  Patient ID: [name] MRN: [id number] DOB/AGE: July 31, 1980 34 y.o.  Admit date: (Not on file) Primary Physician: Ernestine Conrad, MD Referring Physician: Darlyn Chamber NP  Reason for Consultation: chest pain  HPI: The patient is a 35 year old woman who is currently pregnant. She has a history of anxiety previously treated with Celexa but she has been unable to take this due to morning sickness. She is referred for the evaluation of chest pain. She was evaluated in the ED on 5/7. ECG showed sinus tachycardia with a nonspecific ST segment abnormality. ECG on 5/1 showed sinus rhythm with incomplete RBBB and precordial T-wave inversions. Troponin was normal on 5/7. She was mildly hypokalemic at 3.4 and mildly anemic with Hgb 11.7. ED physician thought chest pain was reproducible with palpation and due to anxiety.  She had been off of Celexa for 2-1/2 months but has now been taking it for the past 2 days. She also describes chest pain and shortness of breath when climbing a flight of stairs. This began with her first pregnancy 3-4 years ago. She denies leg swelling and paroxysmal nocturnal dyspnea. She has occasional lightheadedness and dizziness while pregnant but denies syncope. She was told she was dehydrated and has increased her water intake recently. She also has a history of panic attacks.   No Known Allergies  Current Outpatient Prescriptions  Medication Sig Dispense Refill  . acetaminophen (TYLENOL) 325 MG tablet Take 650 mg by mouth every 6 (six) hours as needed for mild pain or headache.     . citalopram (CELEXA) 10 MG tablet Take 10 mg by mouth daily.    . fluticasone (FLONASE) 50 MCG/ACT nasal spray Place 1 spray into both nostrils daily.    Marland Kitchen loratadine (CLARITIN) 10 MG tablet Take 10 mg by mouth at bedtime.     Bertram Gala Glycol-Propyl Glycol (SYSTANE ULTRA) 0.4-0.3  % SOLN Apply 1 drop to eye 2 (two) times daily as needed (for dryness). Reported on 08/16/2015    . Prenatal Vit-Fe Fumarate-FA (PRENATAL MULTIVITAMIN) TABS tablet Take 1 tablet by mouth daily.    . promethazine (PHENERGAN) 6.25 MG/5ML syrup 12.5-25mg  every 6 hours as needed for nausea and or vomiting 120 mL 0   No current facility-administered medications for this visit.    Past Medical History  Diagnosis Date  . Complication of anesthesia     Epidural 1 sided  . Headache(784.0)   . Depression     During 1 pregnancy  . Irregular periods   . Recurrent upper respiratory infection (URI)     Mostly when pregnant  . Diabetes mellitus without complication (HCC)   . Gestational diabetes     just started checking BS 03/05/12  . Pregnancy induced hypertension   . Anemia   . Anxiety "fast hearbeat"    took Benadryl at end of pregnancy  . Pregnant 01/27/2015  . Nausea 01/27/2015  . Spotting affecting pregnancy in first trimester 01/27/2015  . Hematuria 05/27/2015  . Back pain affecting pregnancy in first trimester 05/27/2015    Past Surgical History  Procedure Laterality Date  . Wisdom tooth extraction    . Multiple tooth extractions      Social History   Social History  . Marital Status: Divorced    Spouse Name: N/A  . Number of Children: N/A  . Years of Education: N/A  Occupational History  . Not on file.   Social History Main Topics  . Smoking status: Former Smoker -- 0.10 packs/day for .5 years    Types: Cigarettes    Start date: 02/19/1998    Quit date: 09/17/1998  . Smokeless tobacco: Never Used  . Alcohol Use: No     Comment: not while pregnant  . Drug Use: No  . Sexual Activity: Yes    Birth Control/ Protection: None   Other Topics Concern  . Not on file   Social History Narrative     No family history of premature CAD in 1st degree relatives.  Prior to Admission medications   Medication Sig Start Date End Date Taking? Authorizing Provider    acetaminophen (TYLENOL) 325 MG tablet Take 650 mg by mouth every 6 (six) hours as needed for mild pain or headache.     Historical Provider, MD  citalopram (CELEXA) 10 MG tablet Take 10 mg by mouth daily.    Historical Provider, MD  fluticasone (FLONASE) 50 MCG/ACT nasal spray Place 1 spray into both nostrils daily.    Historical Provider, MD  loratadine (CLARITIN) 10 MG tablet Take 10 mg by mouth at bedtime.     Historical Provider, MD  Polyethyl Glycol-Propyl Glycol (SYSTANE ULTRA) 0.4-0.3 % SOLN Apply 1 drop to eye 2 (two) times daily as needed (for dryness). Reported on 08/16/2015    Historical Provider, MD  Prenatal Vit-Fe Fumarate-FA (PRENATAL MULTIVITAMIN) TABS tablet Take 1 tablet by mouth daily.    Historical Provider, MD  promethazine (PHENERGAN) 6.25 MG/5ML syrup 12.5-25mg  every 6 hours as needed for nausea and or vomiting 06/21/15   Cheral MarkerKimberly R Booker, CNM     Review of systems complete and found to be negative unless listed above in HPI     Physical exam Blood pressure 100/67, pulse 96, height 5\' 6"  (1.676 m), weight 159 lb (72.122 kg), last menstrual period 04/09/2015. General: NAD Neck: No JVD, no thyromegaly or thyroid nodule.  Lungs: Clear to auscultation bilaterally with normal respiratory effort. CV: Nondisplaced PMI. Regular rate and rhythm, normal S1/S2, no S3/S4, no murmur.  No peripheral edema.  Normal pedal pulses.  Abdomen: Gravid.  Skin: Intact without lesions or rashes.  Neurologic: Alert and oriented x 3.  Psych: Normal affect. Extremities: No clubbing or cyanosis.  HEENT: Poor dentition.  ECG: Most recent ECG reviewed.  Labs:   Lab Results  Component Value Date   WBC 7.0 08/14/2015   HGB 11.8* 08/14/2015   HCT 34.1* 08/14/2015   MCV 86.8 08/14/2015   PLT 230 08/14/2015     Recent Labs Lab 08/14/15 1414  NA 136  K 3.4*  CL 105  CO2 23  BUN 7  CREATININE 0.43*  CALCIUM 9.5  PROT 6.9  BILITOT 0.3  ALKPHOS 32*  ALT 12*  AST 13*  GLUCOSE  102*   Lab Results  Component Value Date   CKTOTAL 42 06/11/2007   CKMB 1.3 06/11/2007   TROPONINI <0.03 08/14/2015   No results found for: CHOL No results found for: HDL No results found for: LDLCALC No results found for: TRIG No results found for: CHOLHDL No results found for: LDLDIRECT       Studies: No results found.  ASSESSMENT AND PLAN:  1. Chest pain/shortness of breath/abnormal ECG: Symptoms somewhat atypical and may be due to anxiety. However, there is an exertional component. ECG findings are nonspecific. I will order a 2-D echocardiogram with Doppler to evaluate cardiac structure, function, and  regional wall motion.  Dispo: fu to be determined.   Signed: Prentice Docker, M.D., F.A.C.C.  08/17/2015, 9:24 AM

## 2015-08-18 ENCOUNTER — Other Ambulatory Visit: Payer: Self-pay

## 2015-08-18 ENCOUNTER — Ambulatory Visit (INDEPENDENT_AMBULATORY_CARE_PROVIDER_SITE_OTHER): Payer: Medicaid Other

## 2015-08-18 DIAGNOSIS — R079 Chest pain, unspecified: Secondary | ICD-10-CM

## 2015-08-18 LAB — MATERNAL SCREEN, INTEGRATED #2
ADSF: 1.32
AFP MoM: 1.11
Alpha-Fetoprotein: 34.5 ng/mL
CROWN RUMP LENGTH: 57.9 mm
DIA MOM: 1.15
DIA VALUE: 195.7 pg/mL
ESTRIOL UNCONJUGATED: 1.12 ng/mL
GEST. AGE ON COLLECTION DATE: 12.3 wk
Gestational Age: 16.3 weeks
Maternal Age at EDD: 35.9 years
NUCHAL TRANSLUCENCY (NT): 1.3 mm
NUCHAL TRANSLUCENCY MOM: 0.87
NUMBER OF FETUSES: 1
PAPP-A MoM: 0.56
PAPP-A VALUE: 465.5 ng/mL
TEST RESULTS: NEGATIVE
Weight: 157 [lb_av]
Weight: 158 [lb_av]
hCG MoM: 1.81
hCG Value: 59.3 IU/mL

## 2015-08-23 ENCOUNTER — Telehealth: Payer: Self-pay | Admitting: *Deleted

## 2015-08-23 NOTE — Telephone Encounter (Signed)
Notes Recorded by Lesle ChrisAngela G Austyn Seier, LPN on 5/32/99245/16/2017 at 9:49 AM Patient notified. Copy to pmd.  =======================================================  No follow up required.       ----- Message -----    From: Lesle ChrisAngela G Porfirio Bollier, LPN    Sent: 2/68/34195/15/2017  4:07 PM     To: Laqueta LindenSuresh A Koneswaran, MD   ========================================================  Notes Recorded by Lesle ChrisAngela G Kaelynne Christley, LPN on 6/22/29795/15/2017 at 4:07 PM Does she need any further follow up??  Notes Recorded by Laqueta LindenSuresh A Koneswaran, MD on 08/18/2015 at 7:22 PM Normal echo.

## 2015-08-23 NOTE — Telephone Encounter (Signed)
Copy also fwd to OB/GYN.

## 2015-09-06 ENCOUNTER — Encounter: Payer: Self-pay | Admitting: Women's Health

## 2015-09-06 ENCOUNTER — Ambulatory Visit (INDEPENDENT_AMBULATORY_CARE_PROVIDER_SITE_OTHER): Payer: Medicaid Other

## 2015-09-06 ENCOUNTER — Ambulatory Visit (INDEPENDENT_AMBULATORY_CARE_PROVIDER_SITE_OTHER): Payer: Medicaid Other | Admitting: Women's Health

## 2015-09-06 VITALS — BP 104/62 | HR 76 | Wt 163.0 lb

## 2015-09-06 DIAGNOSIS — O09522 Supervision of elderly multigravida, second trimester: Secondary | ICD-10-CM

## 2015-09-06 DIAGNOSIS — Z1389 Encounter for screening for other disorder: Secondary | ICD-10-CM

## 2015-09-06 DIAGNOSIS — Z36 Encounter for antenatal screening of mother: Secondary | ICD-10-CM

## 2015-09-06 DIAGNOSIS — Z3492 Encounter for supervision of normal pregnancy, unspecified, second trimester: Secondary | ICD-10-CM

## 2015-09-06 DIAGNOSIS — Z363 Encounter for antenatal screening for malformations: Secondary | ICD-10-CM

## 2015-09-06 DIAGNOSIS — Z331 Pregnant state, incidental: Secondary | ICD-10-CM

## 2015-09-06 DIAGNOSIS — Z3A19 19 weeks gestation of pregnancy: Secondary | ICD-10-CM

## 2015-09-06 DIAGNOSIS — O0992 Supervision of high risk pregnancy, unspecified, second trimester: Secondary | ICD-10-CM

## 2015-09-06 DIAGNOSIS — Z3A20 20 weeks gestation of pregnancy: Secondary | ICD-10-CM | POA: Diagnosis not present

## 2015-09-06 LAB — POCT URINALYSIS DIPSTICK
Glucose, UA: NEGATIVE
KETONES UA: NEGATIVE
Leukocytes, UA: NEGATIVE
Nitrite, UA: NEGATIVE
PROTEIN UA: NEGATIVE
RBC UA: NEGATIVE

## 2015-09-06 NOTE — Progress Notes (Signed)
US 19 wks,measurements c/w dates,ant pl gr 0,normal ov's bilat,breech,svp of fluid 5.2,cx 3.6 cm,fhr 168 bpm,efw 260 g,please have pt come back for additional images of spine,gender and cord insertion because of fetal pos.,no obvious abnormalities seen

## 2015-09-06 NOTE — Patient Instructions (Addendum)
Humidifier, nasal spray for nose bleeds TUMS, Zantac, Prilosec, Nexium for heartburn  For Dizzy Spells:   This is usually related to either your blood sugar or your blood pressure dropping  Make sure you are staying well hydrated and drinking enough water so that your urine is clear  Eat small frequent meals and snacks containing protein (meat, eggs, nuts, cheese) so that your blood sugar doesn't drop  If you do get dizzy, sit/lay down and get you something to drink and a snack containing protein- you will usually start feeling better in 10-20 minutes   Second Trimester of Pregnancy The second trimester is from week 13 through week 28, months 4 through 6. The second trimester is often a time when you feel your best. Your body has also adjusted to being pregnant, and you begin to feel better physically. Usually, morning sickness has lessened or quit completely, you may have more energy, and you may have an increase in appetite. The second trimester is also a time when the fetus is growing rapidly. At the end of the sixth month, the fetus is about 9 inches long and weighs about 1 pounds. You will likely begin to feel the baby move (quickening) between 18 and 20 weeks of the pregnancy. BODY CHANGES Your body goes through many changes during pregnancy. The changes vary from woman to woman.   Your weight will continue to increase. You will notice your lower abdomen bulging out.  You may begin to get stretch marks on your hips, abdomen, and breasts.  You may develop headaches that can be relieved by medicines approved by your health care provider.  You may urinate more often because the fetus is pressing on your bladder.  You may develop or continue to have heartburn as a result of your pregnancy.  You may develop constipation because certain hormones are causing the muscles that push waste through your intestines to slow down.  You may develop hemorrhoids or swollen, bulging veins (varicose  veins).  You may have back pain because of the weight gain and pregnancy hormones relaxing your joints between the bones in your pelvis and as a result of a shift in weight and the muscles that support your balance.  Your breasts will continue to grow and be tender.  Your gums may bleed and may be sensitive to brushing and flossing.  Dark spots or blotches (chloasma, mask of pregnancy) may develop on your face. This will likely fade after the baby is born.  A dark line from your belly button to the pubic area (linea nigra) may appear. This will likely fade after the baby is born.  You may have changes in your hair. These can include thickening of your hair, rapid growth, and changes in texture. Some women also have hair loss during or after pregnancy, or hair that feels dry or thin. Your hair will most likely return to normal after your baby is born. WHAT TO EXPECT AT YOUR PRENATAL VISITS During a routine prenatal visit:  You will be weighed to make sure you and the fetus are growing normally.  Your blood pressure will be taken.  Your abdomen will be measured to track your baby's growth.  The fetal heartbeat will be listened to.  Any test results from the previous visit will be discussed. Your health care provider may ask you:  How you are feeling.  If you are feeling the baby move.  If you have had any abnormal symptoms, such as leaking fluid, bleeding,  severe headaches, or abdominal cramping.  If you are using any tobacco products, including cigarettes, chewing tobacco, and electronic cigarettes.  If you have any questions. Other tests that may be performed during your second trimester include:  Blood tests that check for:  Low iron levels (anemia).  Gestational diabetes (between 24 and 28 weeks).  Rh antibodies.  Urine tests to check for infections, diabetes, or protein in the urine.  An ultrasound to confirm the proper growth and development of the baby.  An  amniocentesis to check for possible genetic problems.  Fetal screens for spina bifida and Down syndrome.  HIV (human immunodeficiency virus) testing. Routine prenatal testing includes screening for HIV, unless you choose not to have this test. HOME CARE INSTRUCTIONS   Avoid all smoking, herbs, alcohol, and unprescribed drugs. These chemicals affect the formation and growth of the baby.  Do not use any tobacco products, including cigarettes, chewing tobacco, and electronic cigarettes. If you need help quitting, ask your health care provider. You may receive counseling support and other resources to help you quit.  Follow your health care provider's instructions regarding medicine use. There are medicines that are either safe or unsafe to take during pregnancy.  Exercise only as directed by your health care provider. Experiencing uterine cramps is a good sign to stop exercising.  Continue to eat regular, healthy meals.  Wear a good support bra for breast tenderness.  Do not use hot tubs, steam rooms, or saunas.  Wear your seat belt at all times when driving.  Avoid raw meat, uncooked cheese, cat litter boxes, and soil used by cats. These carry germs that can cause birth defects in the baby.  Take your prenatal vitamins.  Take 1500-2000 mg of calcium daily starting at the 20th week of pregnancy until you deliver your baby.  Try taking a stool softener (if your health care provider approves) if you develop constipation. Eat more high-fiber foods, such as fresh vegetables or fruit and whole grains. Drink plenty of fluids to keep your urine clear or pale yellow.  Take warm sitz baths to soothe any pain or discomfort caused by hemorrhoids. Use hemorrhoid cream if your health care provider approves.  If you develop varicose veins, wear support hose. Elevate your feet for 15 minutes, 3-4 times a day. Limit salt in your diet.  Avoid heavy lifting, wear low heel shoes, and practice good  posture.  Rest with your legs elevated if you have leg cramps or low back pain.  Visit your dentist if you have not gone yet during your pregnancy. Use a soft toothbrush to brush your teeth and be gentle when you floss.  A sexual relationship may be continued unless your health care provider directs you otherwise.  Continue to go to all your prenatal visits as directed by your health care provider. SEEK MEDICAL CARE IF:   You have dizziness.  You have mild pelvic cramps, pelvic pressure, or nagging pain in the abdominal area.  You have persistent nausea, vomiting, or diarrhea.  You have a bad smelling vaginal discharge.  You have pain with urination. SEEK IMMEDIATE MEDICAL CARE IF:   You have a fever.  You are leaking fluid from your vagina.  You have spotting or bleeding from your vagina.  You have severe abdominal cramping or pain.  You have rapid weight gain or loss.  You have shortness of breath with chest pain.  You notice sudden or extreme swelling of your face, hands, ankles, feet, or legs.  You have not felt your baby move in over an hour.  You have severe headaches that do not go away with medicine.  You have vision changes.   This information is not intended to replace advice given to you by your health care provider. Make sure you discuss any questions you have with your health care provider.   Document Released: 03/20/2001 Document Revised: 04/16/2014 Document Reviewed: 05/27/2012 Elsevier Interactive Patient Education 2016 [employer]  

## 2015-09-06 NOTE — Progress Notes (Signed)
Low-risk OB appointment [id number] Estimated Date of Delivery: 01/31/16 BP 104/62 mmHg  Pulse 76  Wt 163 lb (73.936 kg)  LMP 04/09/2015  BP, weight, and urine reviewed.  Refer to obstetrical flow sheet for FH & FHR.  Reports good fm.  Denies regular uc's, lof, vb, or uti s/s. Occ dizziness, nosebleeds- ran out of her flonase and bleeding started after that. To resume flonase, can use humidifier. Discussed reasons/relief measures for dizziness. Discussed normal ekg/echo w/ cardiology. Gets red rash on skin after goes into sun- doesn't wear sunblock- to start wearing and see if improves. Some epigastric pain w/ some reflux- hasn't tried anything- to try tums or otc antacids.  Reviewed today's anatomy u/s- limited views d/t position, unable to tell gender. Discussed warning s/s to report, fm.  Plan:  Continue routine obstetrical care  F/U in 4wks for OB appointment and f/u anatomy u/s

## 2015-09-19 ENCOUNTER — Encounter: Payer: Self-pay | Admitting: Obstetrics and Gynecology

## 2015-09-19 ENCOUNTER — Ambulatory Visit (INDEPENDENT_AMBULATORY_CARE_PROVIDER_SITE_OTHER): Payer: Medicaid Other | Admitting: Obstetrics and Gynecology

## 2015-09-19 VITALS — BP 120/72 | HR 96 | Wt 164.0 lb

## 2015-09-19 DIAGNOSIS — Z3A21 21 weeks gestation of pregnancy: Secondary | ICD-10-CM | POA: Diagnosis not present

## 2015-09-19 DIAGNOSIS — O09522 Supervision of elderly multigravida, second trimester: Secondary | ICD-10-CM | POA: Diagnosis not present

## 2015-09-19 DIAGNOSIS — O0992 Supervision of high risk pregnancy, unspecified, second trimester: Secondary | ICD-10-CM

## 2015-09-19 DIAGNOSIS — O26892 Other specified pregnancy related conditions, second trimester: Secondary | ICD-10-CM | POA: Diagnosis not present

## 2015-09-19 DIAGNOSIS — O09292 Supervision of pregnancy with other poor reproductive or obstetric history, second trimester: Secondary | ICD-10-CM

## 2015-09-19 DIAGNOSIS — Z8632 Personal history of gestational diabetes: Secondary | ICD-10-CM

## 2015-09-19 DIAGNOSIS — O210 Mild hyperemesis gravidarum: Secondary | ICD-10-CM

## 2015-09-19 DIAGNOSIS — Z1389 Encounter for screening for other disorder: Secondary | ICD-10-CM | POA: Diagnosis not present

## 2015-09-19 DIAGNOSIS — Z3492 Encounter for supervision of normal pregnancy, unspecified, second trimester: Secondary | ICD-10-CM

## 2015-09-19 DIAGNOSIS — Z331 Pregnant state, incidental: Secondary | ICD-10-CM

## 2015-09-19 DIAGNOSIS — H539 Unspecified visual disturbance: Secondary | ICD-10-CM

## 2015-09-19 LAB — POCT URINALYSIS DIPSTICK
Blood, UA: NEGATIVE
GLUCOSE UA: NEGATIVE
KETONES UA: NEGATIVE
Leukocytes, UA: NEGATIVE
Nitrite, UA: NEGATIVE
Protein, UA: NEGATIVE

## 2015-09-19 NOTE — Progress Notes (Signed)
Patient ID: [name], female   DOB: July 14, 1980, 35 y.o.   MRN: [id number] [place]  [id number] Estimated Date of Delivery: 01/31/16  Blood pressure 120/72, pulse 96, weight 164 lb (74.39 kg), last menstrual period 04/09/2015.   refer to the ob flow sheet for FH and FHR, also BP, Wt, Urine results: notable for none  Patient reports good fetal movement, denies any bleeding or discharge, and no rupture of membranes symptoms or regular contractions. Patient complaints: Sudden onset blurred vision this morning lasting 30 minutes. Vision is improved slightly currently. She has not eaten this morning. Pt wears glasses and reports her optometrist is monitoring spots on her cornea; last visit in 2014. Pt also reports sinus pressure and intermittent tingling in her left big toe. She notes mild nausea and lower fluid intake yesterday; urine has been light yellow. Pt states her potassium was low at a visit to the Cardiologist and ED; Potassium measured 3.1 on 08/14/2015 per chart review. Pt reports h/o gestational diabetes during 5th pregnancy, controlled through diet. This is her 6th pregnancy. Denies h/o vision changes during pregnancy, syncope, light-headedness, weakness, numbness, diarrhea, or vomiting.  FHR: 154 bpm FH: 21 cm Eyes: On fundoscopic exam, no evidence of papal edema, BL darkly pigmented spots superior to the fundus  Questions were answered. Assessment: [facility] [id number] @ [id number]   Episodic blurred vision, likely secondary to volume depletion   Plan:  Continued routine obstetrical care  Follow up with optometrist  F/u in 4 weeks for regular pnx care   By signing my name below, I, Marisue HumbleMichelle Chaffee, attest that this documentation has been prepared under the direction and in the presence of Tilda BurrowJohn V Abdiaziz Klahn, MD . Electronically Signed: Marisue HumbleMichelle Chaffee, Scribe. 09/19/2015. 10:08 AM.  I personally performed the services described in this documentation, which was SCRIBED in my presence.  The recorded information has been reviewed and considered accurate. It has been edited as necessary during review. Tilda BurrowFERGUSON,Baruch Lewers V, MD

## 2015-09-19 NOTE — Progress Notes (Signed)
Pt worked in today for blurred vision and dizziness that started suddenly this morning.

## 2015-10-04 ENCOUNTER — Encounter: Payer: Self-pay | Admitting: Women's Health

## 2015-10-04 ENCOUNTER — Ambulatory Visit (INDEPENDENT_AMBULATORY_CARE_PROVIDER_SITE_OTHER): Payer: Medicaid Other | Admitting: Women's Health

## 2015-10-04 ENCOUNTER — Ambulatory Visit (INDEPENDENT_AMBULATORY_CARE_PROVIDER_SITE_OTHER): Payer: Medicaid Other

## 2015-10-04 ENCOUNTER — Other Ambulatory Visit: Payer: Self-pay | Admitting: Women's Health

## 2015-10-04 VITALS — BP 102/66 | HR 88 | Wt 169.0 lb

## 2015-10-04 DIAGNOSIS — O0992 Supervision of high risk pregnancy, unspecified, second trimester: Secondary | ICD-10-CM

## 2015-10-04 DIAGNOSIS — Z36 Encounter for antenatal screening of mother: Secondary | ICD-10-CM | POA: Diagnosis not present

## 2015-10-04 DIAGNOSIS — Z3A23 23 weeks gestation of pregnancy: Secondary | ICD-10-CM

## 2015-10-04 DIAGNOSIS — Z1389 Encounter for screening for other disorder: Secondary | ICD-10-CM | POA: Diagnosis not present

## 2015-10-04 DIAGNOSIS — Z331 Pregnant state, incidental: Secondary | ICD-10-CM | POA: Diagnosis not present

## 2015-10-04 DIAGNOSIS — IMO0002 Reserved for concepts with insufficient information to code with codable children: Secondary | ICD-10-CM

## 2015-10-04 DIAGNOSIS — O09522 Supervision of elderly multigravida, second trimester: Secondary | ICD-10-CM | POA: Diagnosis not present

## 2015-10-04 DIAGNOSIS — Z0489 Encounter for examination and observation for other specified reasons: Secondary | ICD-10-CM

## 2015-10-04 DIAGNOSIS — Z3492 Encounter for supervision of normal pregnancy, unspecified, second trimester: Secondary | ICD-10-CM

## 2015-10-04 DIAGNOSIS — Z3A24 24 weeks gestation of pregnancy: Secondary | ICD-10-CM | POA: Diagnosis not present

## 2015-10-04 DIAGNOSIS — Z363 Encounter for antenatal screening for malformations: Secondary | ICD-10-CM

## 2015-10-04 LAB — POCT URINALYSIS DIPSTICK
GLUCOSE UA: NEGATIVE
Ketones, UA: NEGATIVE
Leukocytes, UA: NEGATIVE
Nitrite, UA: NEGATIVE
Protein, UA: NEGATIVE
RBC UA: NEGATIVE

## 2015-10-04 NOTE — Patient Instructions (Addendum)
You will have your sugar test next visit.  Please do not eat or drink anything after midnight the night before you come, not even water.  You will be here for at least two hours.     For your lower back pain you may:  Purchase a pregnancy belt from [employer], [employer], [employer], etc and wear it while you are up and about  Take warm baths  Use a heating pad to your lower back for no longer than 20 minutes at a time, and do not place near abdomen  Take tylenol as needed. Please follow directions on the bottle   Call the office ([phone]) or go to [facility] if:  You begin to have strong, frequent contractions  Your water breaks.  Sometimes it is a big gush of fluid, sometimes it is just a trickle that keeps getting your panties wet or running down your legs  You have vaginal bleeding.  It is normal to have a small amount of spotting if your cervix was checked.   You don't feel your baby moving like normal.  If you don't, get you something to eat and drink and lay down and focus on feeling your baby move.   If your baby is still not moving like normal, you should call the office or go to [facility].  Second Trimester of Pregnancy The second trimester is from week 13 through week 28, months 4 through 6. The second trimester is often a time when you feel your best. Your body has also adjusted to being pregnant, and you begin to feel better physically. Usually, morning sickness has lessened or quit completely, you may have more energy, and you may have an increase in appetite. The second trimester is also a time when the fetus is growing rapidly. At the end of the sixth month, the fetus is about 9 inches long and weighs about 1 pounds. You will likely begin to feel the baby move (quickening) between 18 and 20 weeks of the pregnancy. BODY CHANGES Your body goes through many changes during pregnancy. The changes vary from woman to woman.   Your weight will continue to  increase. You will notice your lower abdomen bulging out.  You may begin to get stretch marks on your hips, abdomen, and breasts.  You may develop headaches that can be relieved by medicines approved by your health care provider.  You may urinate more often because the fetus is pressing on your bladder.  You may develop or continue to have heartburn as a result of your pregnancy.  You may develop constipation because certain hormones are causing the muscles that push waste through your intestines to slow down.  You may develop hemorrhoids or swollen, bulging veins (varicose veins).  You may have back pain because of the weight gain and pregnancy hormones relaxing your joints between the bones in your pelvis and as a result of a shift in weight and the muscles that support your balance.  Your breasts will continue to grow and be tender.  Your gums may bleed and may be sensitive to brushing and flossing.  Dark spots or blotches (chloasma, mask of pregnancy) may develop on your face. This will likely fade after the baby is born.  A dark line from your belly button to the pubic area (linea nigra) may appear. This will likely fade after the baby is born.  You may have changes in your hair. These can include thickening of your hair, rapid   growth, and changes in texture. Some women also have hair loss during or after pregnancy, or hair that feels dry or thin. Your hair will most likely return to normal after your baby is born. WHAT TO EXPECT AT YOUR PRENATAL VISITS During a routine prenatal visit:  You will be weighed to make sure you and the fetus are growing normally.  Your blood pressure will be taken.  Your abdomen will be measured to track your baby's growth.  The fetal heartbeat will be listened to.  Any test results from the previous visit will be discussed. Your health care provider may ask you:  How you are feeling.  If you are feeling the baby move.  If you have had any  abnormal symptoms, such as leaking fluid, bleeding, severe headaches, or abdominal cramping.  If you have any questions. Other tests that may be performed during your second trimester include:  Blood tests that check for:  Low iron levels (anemia).  Gestational diabetes (between 24 and 28 weeks).  Rh antibodies.  Urine tests to check for infections, diabetes, or protein in the urine.  An ultrasound to confirm the proper growth and development of the baby.  An amniocentesis to check for possible genetic problems.  Fetal screens for spina bifida and Down syndrome. HOME CARE INSTRUCTIONS  5. Avoid all smoking, herbs, alcohol, and unprescribed drugs. These chemicals affect the formation and growth of the baby. 6. Follow your health care provider's instructions regarding medicine use. There are medicines that are either safe or unsafe to take during pregnancy. 7. Exercise only as directed by your health care provider. Experiencing uterine cramps is a good sign to stop exercising. 8. Continue to eat regular, healthy meals. 9. Wear a good support bra for breast tenderness. 10. Do not use hot tubs, steam rooms, or saunas. 11. Wear your seat belt at all times when driving. 12. Avoid raw meat, uncooked cheese, cat litter boxes, and soil used by cats. These carry germs that can cause birth defects in the baby. 13. Take your prenatal vitamins. 14. Try taking a stool softener (if your health care provider approves) if you develop constipation. Eat more high-fiber foods, such as fresh vegetables or fruit and whole grains. Drink plenty of fluids to keep your urine clear or pale yellow. 15. Take warm sitz baths to soothe any pain or discomfort caused by hemorrhoids. Use hemorrhoid cream if your health care provider approves. 16. If you develop varicose veins, wear support hose. Elevate your feet for 15 minutes, 3-4 times a day. Limit salt in your diet. 17. Avoid heavy lifting, wear low heel shoes,  and practice good posture. 18. Rest with your legs elevated if you have leg cramps or low back pain. 19. Visit your dentist if you have not gone yet during your pregnancy. Use a soft toothbrush to brush your teeth and be gentle when you floss. 20. A sexual relationship may be continued unless your health care provider directs you otherwise. 21. Continue to go to all your prenatal visits as directed by your health care provider. SEEK MEDICAL CARE IF:   You have dizziness.  You have mild pelvic cramps, pelvic pressure, or nagging pain in the abdominal area.  You have persistent nausea, vomiting, or diarrhea.  You have a bad smelling vaginal discharge.  You have pain with urination. SEEK IMMEDIATE MEDICAL CARE IF:   You have a fever.  You are leaking fluid from your vagina.  You have spotting or bleeding from your   vagina.  You have severe abdominal cramping or pain.  You have rapid weight gain or loss.  You have shortness of breath with chest pain.  You notice sudden or extreme swelling of your face, hands, ankles, feet, or legs.  You have not felt your baby move in over an hour.  You have severe headaches that do not go away with medicine.  You have vision changes. Document Released: 03/20/2001 Document Revised: 03/31/2013 Document Reviewed: 05/27/2012 [employer] Patient Information 2015 [employer]. This information is not intended to replace advice given to you by your health care provider. Make sure you discuss any questions you have with your health care provider.     

## 2015-10-04 NOTE — Progress Notes (Signed)
US 23 wks,cephalic,ant pl gr 0,cx 3.7 cm,normal ov's bilat,fhr 171 bpm,svp of fluid 5.5 cm,efw 555g 42 %,anatomy complete,no obvious abnormalities seen

## 2015-10-04 NOTE — Progress Notes (Signed)
Low-risk OB appointment [id number] Estimated Date of Delivery: 01/31/16 BP 102/66 mmHg  Pulse 88  Wt 169 lb (76.658 kg)  LMP 04/09/2015  BP, weight, and urine reviewed.  Refer to obstetrical flow sheet for FH & FHR.  Reports good fm.  Denies regular uc's, lof, vb, or uti s/s. Some LBP/aches- gave printed info Reviewed today's f/u anatomy u/s- normal anatomy, now complete. Discussed ptl s/s, fm Plan:  Continue routine obstetrical care  F/U in 4wks for OB appointment and pn2

## 2015-10-06 ENCOUNTER — Emergency Department (HOSPITAL_COMMUNITY)
Admission: EM | Admit: 2015-10-06 | Discharge: 2015-10-06 | Disposition: A | Discharge status: Home / Self Care | Payer: Medicaid Other | Attending: Emergency Medicine | Admitting: Emergency Medicine

## 2015-10-06 ENCOUNTER — Encounter (HOSPITAL_COMMUNITY): Payer: Self-pay | Admitting: Emergency Medicine

## 2015-10-06 DIAGNOSIS — H538 Other visual disturbances: Secondary | ICD-10-CM | POA: Insufficient documentation

## 2015-10-06 DIAGNOSIS — Z87891 Personal history of nicotine dependence: Secondary | ICD-10-CM | POA: Insufficient documentation

## 2015-10-06 DIAGNOSIS — E119 Type 2 diabetes mellitus without complications: Secondary | ICD-10-CM | POA: Diagnosis not present

## 2015-10-06 DIAGNOSIS — Z7982 Long term (current) use of aspirin: Secondary | ICD-10-CM | POA: Diagnosis not present

## 2015-10-06 DIAGNOSIS — F329 Major depressive disorder, single episode, unspecified: Secondary | ICD-10-CM | POA: Insufficient documentation

## 2015-10-06 DIAGNOSIS — Z79899 Other long term (current) drug therapy: Secondary | ICD-10-CM | POA: Diagnosis not present

## 2015-10-06 DIAGNOSIS — O9989 Other specified diseases and conditions complicating pregnancy, childbirth and the puerperium: Secondary | ICD-10-CM | POA: Diagnosis not present

## 2015-10-06 DIAGNOSIS — Z3A23 23 weeks gestation of pregnancy: Secondary | ICD-10-CM | POA: Diagnosis not present

## 2015-10-06 LAB — CBG MONITORING, ED: Glucose-Capillary: 93 mg/dL (ref 65–99)

## 2015-10-06 NOTE — ED Notes (Signed)
Physician in to assess pt 

## 2015-10-06 NOTE — Discharge Instructions (Signed)
Follow up with your md if any problems °

## 2015-10-06 NOTE — ED Provider Notes (Signed)
CSN: [id number]     Arrival date & time 10/06/15  2040 History  By signing my name below, I, [name], attest that this documentation has been prepared under the direction and in the presence of physician practitioner, Bethann BerkshireJoseph Layla Kesling, MD. Electronically Signed: Linna Darnerussell Lane, Scribe. 10/06/2015. 9:21 PM.    Chief Complaint  Patient presents with  . Blurred Vision    HPI Comments: Pt complains of blurred vision.  Patient is a 35 y.o. female presenting with eye problem. The history is provided by the patient. No language interpreter was used.  Eye Problem Location:  Both Severity:  Moderate Onset quality:  Sudden Duration:  45 minutes Timing:  Intermittent Progression:  Improving Chronicity:  New Context: not contact lens problem   Relieved by:  None tried Ineffective treatments:  None tried Associated symptoms: blurred vision and headaches   Associated symptoms: no numbness and no weakness   Headaches:    Severity:  Moderate   Onset quality:  Sudden   Duration: 45 minutes.   Timing:  Intermittent   Progression:  Unable to specify   Chronicity:  New    HPI Comments: [name] is a 35 y.o. female with extensive PMHx, including headache and DM, who presents to the Emergency Department complaining of sudden onset, intermittent, improving, bilateral blurred vision beginning about 45 minutes ago. Pt states that her symptoms have improved significantly but she is experiencing mild blurry vision currently. She reports that she was watching a movie tonight and began to see "jagged lines" intermittently. Pt notes associated mild dizziness and headache as well. Pt wears glasses but does not use contact lenses. She denies difficulty ambulating, eye pain, numbness, weakness, or any other associated symptoms. She is [redacted] weeks pregnant and going through her eighth pregnancy, two of which were miscarriages.  Past Medical History  Diagnosis Date  . Complication of anesthesia    Epidural 1 sided  . Headache(784.0)   . Depression     During 1 pregnancy  . Irregular periods   . Recurrent upper respiratory infection (URI)     Mostly when pregnant  . Diabetes mellitus without complication (HCC)   . Gestational diabetes     just started checking BS 03/05/12  . Pregnancy induced hypertension   . Anemia   . Anxiety "fast hearbeat"    took Benadryl at end of pregnancy  . Pregnant 01/27/2015  . Nausea 01/27/2015  . Spotting affecting pregnancy in first trimester 01/27/2015  . Hematuria 05/27/2015  . Back pain affecting pregnancy in first trimester 05/27/2015   Past Surgical History  Procedure Laterality Date  . Wisdom tooth extraction    . Multiple tooth extractions     Family History  Problem Relation Age of Onset  . Heart disease Maternal Grandmother   . Cancer Maternal Grandmother     brain  . Heart failure Maternal Grandmother   . Cancer Paternal Grandmother     brain  . Hypertension Mother   . Anxiety disorder Mother   . Depression Mother   . Hypertension Father   . Anxiety disorder Sister   . ADD / ADHD Daughter   . Asthma Daughter   . ADD / ADHD Daughter   . Seizures Daughter   . ADD / ADHD Daughter   . ADD / ADHD Daughter    Social History  Substance Use Topics  . Smoking status: Former Smoker -- 0.10 packs/day for .5 years    Types: Cigarettes    Start date:  02/19/1998    Quit date: 09/17/1998  . Smokeless tobacco: Never Used  . Alcohol Use: No     Comment: not while pregnant   OB History    Gravida Para Term Preterm AB TAB SAB Ectopic Multiple Living   0 Review of Systems  Constitutional: Negative for appetite change and fatigue.  HENT: Negative for congestion, ear discharge and sinus pressure.   Eyes: Positive for blurred vision and visual disturbance (blurred vision bilaterally). Negative for pain.  Respiratory: Negative for cough.   Cardiovascular: Negative for chest pain.  Gastrointestinal: Negative for  abdominal pain and diarrhea.  Genitourinary: Negative for frequency and hematuria.  Musculoskeletal: Negative for back pain.  Skin: Negative for rash.  Neurological: Positive for dizziness and headaches. Negative for seizures, weakness and numbness.  Psychiatric/Behavioral: Negative for hallucinations.    Allergies  Review of patient's allergies indicates no known allergies.  Home Medications   Prior to Admission medications   Medication Sig Start Date End Date Taking? Authorizing Provider  acetaminophen (TYLENOL) 325 MG tablet Take 650 mg by mouth every 6 (six) hours as needed for mild pain or headache.    Yes Historical Provider, MD  aspirin EC 81 MG tablet Take 81 mg by mouth daily.   Yes Historical Provider, MD  citalopram (CELEXA) 10 MG tablet Take 10 mg by mouth daily.   Yes Historical Provider, MD  fluticasone (FLONASE) 50 MCG/ACT nasal spray Place 1 spray into both nostrils daily.   Yes Historical Provider, MD  loratadine (CLARITIN) 10 MG tablet Take 10 mg by mouth at bedtime.    Yes Historical Provider, MD  Prenatal Vit-Fe Fumarate-FA (PRENATAL MULTIVITAMIN) TABS tablet Take 1 tablet by mouth daily.   Yes Historical Provider, MD   BP 135/79 mmHg  Pulse 102  Temp(Src) 98.7 F (37.1 C) (Oral)  Resp 18  Ht  (1.6 m)  Wt 168 lb (76.204 kg)  BMI 29.77 kg/m2  SpO2 100%  LMP 04/09/2015 Physical Exam  Constitutional: She is oriented to person, place, and time. She appears well-developed.  HENT:  Head: Normocephalic.  Eyes: Conjunctivae and EOM are normal. Pupils are equal, round, and reactive to light. No scleral icterus.  Neck: Neck supple. No thyromegaly present.  Cardiovascular: Normal rate and regular rhythm.  Exam reveals no gallop and no friction rub.   No murmur heard. Pulmonary/Chest: No stridor. She has no wheezes. She has no rales. She exhibits no tenderness.  Abdominal: She exhibits no distension. There is no tenderness. There is no rebound.  Abdomen size  consistent with length of pregnancy.  Musculoskeletal: Normal range of motion. She exhibits no edema.  Lymphadenopathy:    She has no cervical adenopathy.  Neurological: She is oriented to person, place, and time. She exhibits normal muscle tone. Coordination normal.  Skin: No rash noted. No erythema.  Psychiatric: She has a normal mood and affect. Her behavior is normal.    ED Course  Procedures (including critical care time)  DIAGNOSTIC STUDIES: Oxygen Saturation is 100% on RA, normal by my interpretation.    COORDINATION OF CARE: 9:21 PM Discussed treatment plan with pt at bedside and pt agreed to plan.  Labs Review Labs Reviewed  CBG MONITORING, ED    Imaging Review No results found. I have personally reviewed and evaluated these images and lab results as part of my medical decision-making.   EKG Interpretation None  MDM   Final diagnoses:  None   Patient vision is completely back to normal after 20 minutes. I suspect that she had a migraine variant. She will follow-up with her PCP or OB/GYN as needed  Bethann BerkshireJoseph Charde Macfarlane, MD 10/06/15 2132

## 2015-10-06 NOTE — ED Notes (Signed)
Visual acuity: OS 20/25 OD 20/30 OU 2020  With patient wearing her glasses. She reports a "film type sensation over her eyes making them blurry with ? Lines for 2-30 minutes that resolved spontaineously

## 2015-10-06 NOTE — ED Notes (Signed)
Patient states she was watching TV approximately 30 minutes ago and had blurry vision. States symptoms are "better but still a little blurry." States she is [redacted] weeks pregnant at this time.

## 2015-11-01 ENCOUNTER — Ambulatory Visit (INDEPENDENT_AMBULATORY_CARE_PROVIDER_SITE_OTHER): Payer: Medicaid Other | Admitting: Women's Health

## 2015-11-01 ENCOUNTER — Other Ambulatory Visit: Payer: Medicaid Other

## 2015-11-01 ENCOUNTER — Encounter: Payer: Self-pay | Admitting: Women's Health

## 2015-11-01 VITALS — BP 116/62 | HR 92 | Wt 176.0 lb

## 2015-11-01 DIAGNOSIS — Z1389 Encounter for screening for other disorder: Secondary | ICD-10-CM

## 2015-11-01 DIAGNOSIS — Z331 Pregnant state, incidental: Secondary | ICD-10-CM

## 2015-11-01 DIAGNOSIS — Z131 Encounter for screening for diabetes mellitus: Secondary | ICD-10-CM

## 2015-11-01 DIAGNOSIS — Z3A27 27 weeks gestation of pregnancy: Secondary | ICD-10-CM | POA: Diagnosis not present

## 2015-11-01 DIAGNOSIS — O09522 Supervision of elderly multigravida, second trimester: Secondary | ICD-10-CM | POA: Diagnosis not present

## 2015-11-01 DIAGNOSIS — O0992 Supervision of high risk pregnancy, unspecified, second trimester: Secondary | ICD-10-CM

## 2015-11-01 DIAGNOSIS — Z3493 Encounter for supervision of normal pregnancy, unspecified, third trimester: Secondary | ICD-10-CM

## 2015-11-01 DIAGNOSIS — Z369 Encounter for antenatal screening, unspecified: Secondary | ICD-10-CM

## 2015-11-01 LAB — POCT URINALYSIS DIPSTICK
GLUCOSE UA: NEGATIVE
KETONES UA: NEGATIVE
Leukocytes, UA: NEGATIVE
NITRITE UA: NEGATIVE
Protein, UA: NEGATIVE
RBC UA: NEGATIVE

## 2015-11-01 NOTE — Progress Notes (Signed)
Low-risk OB appointment [id number] [redacted]w[redacted]d Estimated Date of Delivery: 01/31/16 BP 116/62   Pulse 92   Wt 176 lb (79.8 kg)   LMP 04/09/2015   BMI 31.18 kg/m   BP, weight, and urine reviewed.  Refer to obstetrical flow sheet for FH & FHR.  Reports good fm.  Denies regular uc's, lof, vb, or uti s/s. 1-2 Braxton Hicks/day, LBP- gave relief measures Reviewed ptl s/s, fkc. Recommended Tdap at HD/PCP per CDC guidelines.  Plan:  Continue routine obstetrical care  F/U in 3wks for OB appointment  PN2 today

## 2015-11-01 NOTE — Patient Instructions (Addendum)
Call the office [phone]) or go to [facility] if:  You begin to have strong, frequent contractions  Your water breaks.  Sometimes it is a big gush of fluid, sometimes it is just a trickle that keeps getting your panties wet or running down your legs  You have vaginal bleeding.  It is normal to have a small amount of spotting if your cervix was checked.   You don't feel your baby moving like normal.  If you don't, get you something to eat and drink and lay down and focus on feeling your baby move.  You should feel at least 10 movements in 2 hours.  If you don't, you should call the office or go to [facility].    Tdap Vaccine  It is recommended that you get the Tdap vaccine during the third trimester of EACH pregnancy to help protect your baby from getting pertussis (whooping cough)  27-36 weeks is the BEST time to do this so that you can pass the protection on to your baby. During pregnancy is better than after pregnancy, but if you are unable to get it during pregnancy it will be offered at the hospital.   You can get this vaccine at the health department or your family doctor Everyone who will be around your baby should also be up-to-date on their vaccines. Adults (who are not pregnant) only need 1 dose of Tdap during adulthood.   For your lower back pain you may:  Purchase a pregnancy belt from [employer], [employer], [employer], etc and wear it while you are up and about  Take warm baths  Use a heating pad to your lower back for no longer than 20 minutes at a time, and do not place near abdomen  Take tylenol as needed. Please follow directions on the bottle   Third Trimester of Pregnancy The third trimester is from week 29 through week 42, months 7 through 9. The third trimester is a time when the fetus is growing rapidly. At the end of the ninth month, the fetus is about 20 inches in length and weighs 6-10 pounds.  BODY CHANGES Your body goes through many  changes during pregnancy. The changes vary from woman to woman.   Your weight will continue to increase. You can expect to gain 25-35 pounds (11-16 kg) by the end of the pregnancy.  You may begin to get stretch marks on your hips, abdomen, and breasts.  You may urinate more often because the fetus is moving lower into your pelvis and pressing on your bladder.  You may develop or continue to have heartburn as a result of your pregnancy.  You may develop constipation because certain hormones are causing the muscles that push waste through your intestines to slow down.  You may develop hemorrhoids or swollen, bulging veins (varicose veins).  You may have pelvic pain because of the weight gain and pregnancy hormones relaxing your joints between the bones in your pelvis. Backaches may result from overexertion of the muscles supporting your posture.  You may have changes in your hair. These can include thickening of your hair, rapid growth, and changes in texture. Some women also have hair loss during or after pregnancy, or hair that feels dry or thin. Your hair will most likely return to normal after your baby is born.  Your breasts will continue to grow and be tender. A yellow discharge may leak from your breasts called colostrum.  Your belly button may stick out.  You may feel short of breath because of your expanding uterus.  You may notice the fetus "dropping," or moving lower in your abdomen.  You may have a bloody mucus discharge. This usually occurs a few days to a week before labor begins.  Your cervix becomes thin and soft (effaced) near your due date. WHAT TO EXPECT AT YOUR PRENATAL EXAMS  You will have prenatal exams every 2 weeks until week 36. Then, you will have weekly prenatal exams. During a routine prenatal visit:  You will be weighed to make sure you and the fetus are growing normally.  Your blood pressure is taken.  Your abdomen will be measured to track your baby's  growth.  The fetal heartbeat will be listened to.  Any test results from the previous visit will be discussed.  You may have a cervical check near your due date to see if you have effaced. At around 36 weeks, your caregiver will check your cervix. At the same time, your caregiver will also perform a test on the secretions of the vaginal tissue. This test is to determine if a type of bacteria, Group B streptococcus, is present. Your caregiver will explain this further. Your caregiver may ask you:  What your birth plan is.  How you are feeling.  If you are feeling the baby move.  If you have had any abnormal symptoms, such as leaking fluid, bleeding, severe headaches, or abdominal cramping.  If you have any questions. Other tests or screenings that may be performed during your third trimester include:  Blood tests that check for low iron levels (anemia).  Fetal testing to check the health, activity level, and growth of the fetus. Testing is done if you have certain medical conditions or if there are problems during the pregnancy. FALSE LABOR You may feel small, irregular contractions that eventually go away. These are called Braxton Hicks contractions, or false labor. Contractions may last for hours, days, or even weeks before true labor sets in. If contractions come at regular intervals, intensify, or become painful, it is best to be seen by your caregiver.  SIGNS OF LABOR   Menstrual-like cramps.  Contractions that are 5 minutes apart or less.  Contractions that start on the top of the uterus and spread down to the lower abdomen and back.  A sense of increased pelvic pressure or back pain.  A watery or bloody mucus discharge that comes from the vagina. If you have any of these signs before the 37th week of pregnancy, call your caregiver right away. You need to go to the hospital to get checked immediately. HOME CARE INSTRUCTIONS   Avoid all smoking, herbs, alcohol, and  unprescribed drugs. These chemicals affect the formation and growth of the baby.  Follow your caregiver's instructions regarding medicine use. There are medicines that are either safe or unsafe to take during pregnancy.  Exercise only as directed by your caregiver. Experiencing uterine cramps is a good sign to stop exercising.  Continue to eat regular, healthy meals.  Wear a good support bra for breast tenderness.  Do not use hot tubs, steam rooms, or saunas.  Wear your seat belt at all times when driving.  Avoid raw meat, uncooked cheese, cat litter boxes, and soil used by cats. These carry germs that can cause birth defects in the baby.  Take your prenatal vitamins.  Try taking a stool softener (if your caregiver approves) if you develop constipation. Eat more high-fiber foods, such as fresh vegetables or  fruit and whole grains. Drink plenty of fluids to keep your urine clear or pale yellow.  Take warm sitz baths to soothe any pain or discomfort caused by hemorrhoids. Use hemorrhoid cream if your caregiver approves.  If you develop varicose veins, wear support hose. Elevate your feet for 15 minutes, 3-4 times a day. Limit salt in your diet.  Avoid heavy lifting, wear low heal shoes, and practice good posture.  Rest a lot with your legs elevated if you have leg cramps or low back pain.  Visit your dentist if you have not gone during your pregnancy. Use a soft toothbrush to brush your teeth and be gentle when you floss.  A sexual relationship may be continued unless your caregiver directs you otherwise.  Do not travel far distances unless it is absolutely necessary and only with the approval of your caregiver.  Take prenatal classes to understand, practice, and ask questions about the labor and delivery.  Make a trial run to the hospital.  Pack your hospital bag.  Prepare the baby's nursery.  Continue to go to all your prenatal visits as directed by your caregiver. SEEK  MEDICAL CARE IF:  You are unsure if you are in labor or if your water has broken.  You have dizziness.  You have mild pelvic cramps, pelvic pressure, or nagging pain in your abdominal area.  You have persistent nausea, vomiting, or diarrhea.  You have a bad smelling vaginal discharge.  You have pain with urination. SEEK IMMEDIATE MEDICAL CARE IF:   You have a fever.  You are leaking fluid from your vagina.  You have spotting or bleeding from your vagina.  You have severe abdominal cramping or pain.  You have rapid weight loss or gain.  You have shortness of breath with chest pain.  You notice sudden or extreme swelling of your face, hands, ankles, feet, or legs.  You have not felt your baby move in over an hour.  You have severe headaches that do not go away with medicine.  You have vision changes. Document Released: 03/20/2001 Document Revised: 03/31/2013 Document Reviewed: 05/27/2012 [facility] Patient Information 2015 [place], [place]. This information is not intended to replace advice given to you by your health care provider. Make sure you discuss any questions you have with your health care provider.

## 2015-11-02 LAB — CBC
HEMATOCRIT: 34.9 % (ref 34.0–46.6)
HEMOGLOBIN: 11.6 g/dL (ref 11.1–15.9)
MCH: 30.5 pg (ref 26.6–33.0)
MCHC: 33.2 g/dL (ref 31.5–35.7)
MCV: 92 fL (ref 79–97)
Platelets: 232 10*3/uL (ref 150–379)
RBC: 3.8 x10E6/uL (ref 3.77–5.28)
RDW: 13.4 % (ref 12.3–15.4)
WBC: 7.1 10*3/uL (ref 3.4–10.8)

## 2015-11-02 LAB — ANTIBODY SCREEN: ANTIBODY SCREEN: NEGATIVE

## 2015-11-02 LAB — HIV ANTIBODY (ROUTINE TESTING W REFLEX): HIV Screen 4th Generation wRfx: NONREACTIVE

## 2015-11-02 LAB — GLUCOSE TOLERANCE, 2 HOURS W/ 1HR
GLUCOSE, 2 HOUR: 127 mg/dL (ref 65–152)
GLUCOSE, FASTING: 77 mg/dL (ref 65–91)
Glucose, 1 hour: 145 mg/dL (ref 65–179)

## 2015-11-02 LAB — RPR: RPR Ser Ql: NONREACTIVE

## 2015-11-18 ENCOUNTER — Encounter: Payer: Self-pay | Admitting: Obstetrics and Gynecology

## 2015-11-18 ENCOUNTER — Ambulatory Visit (INDEPENDENT_AMBULATORY_CARE_PROVIDER_SITE_OTHER): Payer: Medicaid Other | Admitting: Obstetrics and Gynecology

## 2015-11-18 VITALS — BP 140/80 | HR 98 | Wt 176.0 lb

## 2015-11-18 DIAGNOSIS — Z1389 Encounter for screening for other disorder: Secondary | ICD-10-CM | POA: Diagnosis not present

## 2015-11-18 DIAGNOSIS — O09523 Supervision of elderly multigravida, third trimester: Secondary | ICD-10-CM | POA: Diagnosis not present

## 2015-11-18 DIAGNOSIS — O2693 Pregnancy related conditions, unspecified, third trimester: Secondary | ICD-10-CM

## 2015-11-18 DIAGNOSIS — O0993 Supervision of high risk pregnancy, unspecified, third trimester: Secondary | ICD-10-CM | POA: Diagnosis not present

## 2015-11-18 DIAGNOSIS — Z3A30 30 weeks gestation of pregnancy: Secondary | ICD-10-CM

## 2015-11-18 DIAGNOSIS — Z3492 Encounter for supervision of normal pregnancy, unspecified, second trimester: Secondary | ICD-10-CM

## 2015-11-18 DIAGNOSIS — O99513 Diseases of the respiratory system complicating pregnancy, third trimester: Secondary | ICD-10-CM

## 2015-11-18 DIAGNOSIS — Z331 Pregnant state, incidental: Secondary | ICD-10-CM | POA: Diagnosis not present

## 2015-11-18 LAB — POCT URINALYSIS DIPSTICK
Glucose, UA: NEGATIVE
Ketones, UA: NEGATIVE
LEUKOCYTES UA: NEGATIVE
NITRITE UA: NEGATIVE
PROTEIN UA: NEGATIVE
RBC UA: NEGATIVE

## 2015-11-18 NOTE — Progress Notes (Signed)
Patient ID: [name], female   DOB: 1980/08/26, 35 y.o.   MRN: [id number]  [id number]  Estimated Date of Delivery: 01/31/16 LROB 8345w3d  Blood pressure 140/80, pulse 98, weight 176 lb (79.8 kg), last menstrual period 04/09/2015.    Urine results: notable for none  Chief Complaint  Patient presents with  . Dizziness    Patient complaints: 3 days of moderate, waxing and waning, persistent dizziness and lightheadedness. Pt states her dizziness is worsened with head movement and prolonged periods of standing. She also complains of frontal HA and sinus pressure. Pt states she experiences similar symptoms with prior colds, but not as severe. She also complains of mild SOB and the sensation of her heart racing today. She denies recent fever, visual disturbance.   Patient reports good fetal movement. She denies any bleeding, rupture of membranes, or regular contractions.   Refer to the ob flow sheet for FH and FHR.    Physical Examination: General appearance - alert, well appearing, and in no distress    HENT-TMs clear bilaterally, no retraction, erythema or middle ear effusion bilaterally.     Cardiac- Pulse 114                                            Questions were answered. Assessment: LROB [id number] @ 4245w3d  Dizziness and lightheadedness x 3 days  Suspect middle ear sensitivity    Plan:   Continued routine obstetrical care Pt advised to stay well hydrated  Pt advised not to drive when she is feeling dizzy or lightheaded   F/u in 4 days as scheduled for routine prenatal care   By signing my name below, I, Doreatha MartinEva Mathews, attest that this documentation has been prepared under the direction and in the presence of Tilda BurrowJohn V Angely Dietz, MD. Electronically Signed: Doreatha MartinEva Mathews, ED Scribe. 11/18/15. 1:04 PM.  I personally performed the services described in this documentation, which was SCRIBED in my presence. The recorded information has been reviewed and considered accurate. It has been  edited as necessary during review. Tilda BurrowFERGUSON,Cheikh Bramble V, MD

## 2015-11-18 NOTE — Progress Notes (Signed)
Pt worked in today for dizziness. Pt states that the dizziness happens at anytime, but is worse with movement.

## 2015-11-22 ENCOUNTER — Ambulatory Visit (INDEPENDENT_AMBULATORY_CARE_PROVIDER_SITE_OTHER): Payer: Medicaid Other | Admitting: Women's Health

## 2015-11-22 ENCOUNTER — Encounter: Payer: Self-pay | Admitting: Women's Health

## 2015-11-22 VITALS — BP 118/70 | HR 88 | Wt 176.0 lb

## 2015-11-22 DIAGNOSIS — Z1389 Encounter for screening for other disorder: Secondary | ICD-10-CM

## 2015-11-22 DIAGNOSIS — Z3A30 30 weeks gestation of pregnancy: Secondary | ICD-10-CM

## 2015-11-22 DIAGNOSIS — O360130 Maternal care for anti-D [Rh] antibodies, third trimester, not applicable or unspecified: Secondary | ICD-10-CM | POA: Diagnosis not present

## 2015-11-22 DIAGNOSIS — Z331 Pregnant state, incidental: Secondary | ICD-10-CM

## 2015-11-22 DIAGNOSIS — Z6791 Unspecified blood type, Rh negative: Secondary | ICD-10-CM

## 2015-11-22 DIAGNOSIS — O09523 Supervision of elderly multigravida, third trimester: Secondary | ICD-10-CM | POA: Diagnosis not present

## 2015-11-22 DIAGNOSIS — O0993 Supervision of high risk pregnancy, unspecified, third trimester: Secondary | ICD-10-CM

## 2015-11-22 DIAGNOSIS — O09293 Supervision of pregnancy with other poor reproductive or obstetric history, third trimester: Secondary | ICD-10-CM | POA: Diagnosis not present

## 2015-11-22 DIAGNOSIS — Z8632 Personal history of gestational diabetes: Secondary | ICD-10-CM

## 2015-11-22 DIAGNOSIS — Z3493 Encounter for supervision of normal pregnancy, unspecified, third trimester: Secondary | ICD-10-CM

## 2015-11-22 LAB — POCT URINALYSIS DIPSTICK
Blood, UA: NEGATIVE
Glucose, UA: NEGATIVE
KETONES UA: NEGATIVE
LEUKOCYTES UA: NEGATIVE
NITRITE UA: NEGATIVE
PROTEIN UA: NEGATIVE

## 2015-11-22 MED ORDER — RHO D IMMUNE GLOBULIN 1500 UNIT/2ML IJ SOSY
300.0000 ug | PREFILLED_SYRINGE | Freq: Once | INTRAMUSCULAR | Status: AC
  Administered 2015-11-22: 300 ug via INTRAMUSCULAR

## 2015-11-22 NOTE — Progress Notes (Signed)
Low-risk OB appointment [id number] 5744w0d Estimated Date of Delivery: 01/31/16 BP 118/70   Pulse 88   Wt 176 lb (79.8 kg)   LMP 04/09/2015   BMI 31.18 kg/m   BP, weight, and urine reviewed.  Refer to obstetrical flow sheet for FH & FHR.  Reports good fm.  Denies regular uc's, lof, vb, or uti s/s. Dizziness, feels like she is congested, can try otc decongestants- gave printed info on dizziness in pregnancy. Got Tdap at end of July. Lots of pressure, had ptl w/ 3rd child but delivered @ term, not sure if this feels the same, wants SVE to be sure.  Spec exam; cx multiparous visually ~1cm, fFN collected SVE: outer os 2cm/inner os closed/thick, fFN not sent Reviewed normal pn2 results, ptl s/s, fkc. Plan:  Continue routine obstetrical care  F/U in 2wks for OB appointment  Rhogam today

## 2015-11-22 NOTE — Patient Instructions (Addendum)
For Dizzy Spells:   This is usually related to either your blood sugar or your blood pressure dropping  Make sure you are staying well hydrated and drinking enough water so that your urine is clear  Eat small frequent meals and snacks containing protein (meat, eggs, nuts, cheese) so that your blood sugar doesn't drop  If you do get dizzy, sit/lay down and get you something to drink and a snack containing protein- you will usually start feeling better in 10-20 minutes    Call the office [phone]) or go to [facility] if:  You begin to have strong, frequent contractions  Your water breaks.  Sometimes it is a big gush of fluid, sometimes it is just a trickle that keeps getting your panties wet or running down your legs  You have vaginal bleeding.  It is normal to have a small amount of spotting if your cervix was checked.   You don't feel your baby moving like normal.  If you don't, get you something to eat and drink and lay down and focus on feeling your baby move.  You should feel at least 10 movements in 2 hours.  If you don't, you should call the office or go to [facility].   Preterm Labor Information Preterm labor is when labor starts at less than 37 weeks of pregnancy. The normal length of a pregnancy is 39 to 41 weeks. CAUSES Often, there is no identifiable underlying cause as to why a woman goes into preterm labor. One of the most common known causes of preterm labor is infection. Infections of the uterus, cervix, vagina, amniotic sac, bladder, kidney, or even the lungs (pneumonia) can cause labor to start. Other suspected causes of preterm labor include:   Urogenital infections, such as yeast infections and bacterial vaginosis.   Uterine abnormalities (uterine shape, uterine septum, fibroids, or bleeding from the placenta).   A cervix that has been operated on (it may fail to stay closed).   Malformations in the fetus.   Multiple gestations (twins, triplets, and  so on).   Breakage of the amniotic sac.  RISK FACTORS  Having a previous history of preterm labor.   Having premature rupture of membranes (PROM).   Having a placenta that covers the opening of the cervix (placenta previa).   Having a placenta that separates from the uterus (placental abruption).   Having a cervix that is too weak to hold the fetus in the uterus (incompetent cervix).   Having too much fluid in the amniotic sac (polyhydramnios).   Taking illegal drugs or smoking while pregnant.   Not gaining enough weight while pregnant.   Being younger than 2718 and older than 77106 years old.   Having a low socioeconomic status.   Being African American. SYMPTOMS Signs and symptoms of preterm labor include:   Menstrual-like cramps, abdominal pain, or back pain.  Uterine contractions that are regular, as frequent as six in an hour, regardless of their intensity (may be mild or painful).  Contractions that start on the top of the uterus and spread down to the lower abdomen and back.   A sense of increased pelvic pressure.   A watery or bloody mucus discharge that comes from the vagina.  TREATMENT Depending on the length of the pregnancy and other circumstances, your health care provider may suggest bed rest. If necessary, there are medicines that can be given to stop contractions and to mature the fetal lungs. If labor happens before 34 weeks of  pregnancy, a prolonged hospital stay may be recommended. Treatment depends on the condition of both you and the fetus.  WHAT SHOULD YOU DO IF YOU THINK YOU ARE IN PRETERM LABOR? Call your health care provider right away. You will need to go to the hospital to get checked immediately. HOW CAN YOU PREVENT PRETERM LABOR IN FUTURE PREGNANCIES? You should:   Stop smoking if you smoke.  Maintain healthy weight gain and avoid chemicals and drugs that are not necessary.  Be watchful for any type of infection.  Inform your  health care provider if you have a known history of preterm labor.   This information is not intended to replace advice given to you by your health care provider. Make sure you discuss any questions you have with your health care provider.   Document Released: 06/16/2003 Document Revised: 11/26/2012 Document Reviewed: 04/28/2012 Elsevier Interactive Patient Education [employer]2016 Elsevier Inc.

## 2015-12-06 ENCOUNTER — Ambulatory Visit (INDEPENDENT_AMBULATORY_CARE_PROVIDER_SITE_OTHER): Payer: Medicaid Other | Admitting: Women's Health

## 2015-12-06 ENCOUNTER — Encounter: Payer: Self-pay | Admitting: Women's Health

## 2015-12-06 VITALS — BP 132/88 | HR 76 | Wt 181.0 lb

## 2015-12-06 DIAGNOSIS — O0993 Supervision of high risk pregnancy, unspecified, third trimester: Secondary | ICD-10-CM

## 2015-12-06 DIAGNOSIS — Z3493 Encounter for supervision of normal pregnancy, unspecified, third trimester: Secondary | ICD-10-CM

## 2015-12-06 DIAGNOSIS — O09523 Supervision of elderly multigravida, third trimester: Secondary | ICD-10-CM

## 2015-12-06 DIAGNOSIS — Z3A32 32 weeks gestation of pregnancy: Secondary | ICD-10-CM

## 2015-12-06 DIAGNOSIS — Z331 Pregnant state, incidental: Secondary | ICD-10-CM

## 2015-12-06 DIAGNOSIS — Z1389 Encounter for screening for other disorder: Secondary | ICD-10-CM

## 2015-12-06 LAB — POCT URINALYSIS DIPSTICK
Blood, UA: NEGATIVE
Glucose, UA: NEGATIVE
Ketones, UA: NEGATIVE
Leukocytes, UA: NEGATIVE
Nitrite, UA: NEGATIVE
PROTEIN UA: NEGATIVE

## 2015-12-06 NOTE — Patient Instructions (Signed)
Call the office ([phone]) or [facility] for these signs of pre-eclampsia:  Severe headache that does not go away with Tylenol  Visual changes- seeing spots, double, blurred vision  Pain under your right breast or upper abdomen that does not go away with Tums or heartburn medicine  Nausea and/or vomiting  Severe swelling in your hands, feet, and face     Call the office ([phone]) or go to [facility] if:  You begin to have strong, frequent contractions  Your water breaks.  Sometimes it is a big gush of fluid, sometimes it is just a trickle that keeps getting your panties wet or running down your legs  You have vaginal bleeding.  It is normal to have a small amount of spotting if your cervix was checked.   You don't feel your baby moving like normal.  If you don't, get you something to eat and drink and lay down and focus on feeling your baby move.  You should feel at least 10 movements in 2 hours.  If you don't, you should call the office or go to [facility].    Preterm Labor Information Preterm labor is when labor starts at less than 37 weeks of pregnancy. The normal length of a pregnancy is 39 to 41 weeks. CAUSES Often, there is no identifiable underlying cause as to why a woman goes into preterm labor. One of the most common known causes of preterm labor is infection. Infections of the uterus, cervix, vagina, amniotic sac, bladder, kidney, or even the lungs (pneumonia) can cause labor to start. Other suspected causes of preterm labor include:   Urogenital infections, such as yeast infections and bacterial vaginosis.   Uterine abnormalities (uterine shape, uterine septum, fibroids, or bleeding from the placenta).   A cervix that has been operated on (it may fail to stay closed).   Malformations in the fetus.   Multiple gestations (twins, triplets, and so on).   Breakage of the amniotic sac.  RISK FACTORS  Having a previous history of preterm  labor.   Having premature rupture of membranes (PROM).   Having a placenta that covers the opening of the cervix (placenta previa).   Having a placenta that separates from the uterus (placental abruption).   Having a cervix that is too weak to hold the fetus in the uterus (incompetent cervix).   Having too much fluid in the amniotic sac (polyhydramnios).   Taking illegal drugs or smoking while pregnant.   Not gaining enough weight while pregnant.   Being younger than 18 and older than 35 years old.   Having a low socioeconomic status.   Being African American. SYMPTOMS Signs and symptoms of preterm labor include:   Menstrual-like cramps, abdominal pain, or back pain.  Uterine contractions that are regular, as frequent as six in an hour, regardless of their intensity (may be mild or painful).  Contractions that start on the top of the uterus and spread down to the lower abdomen and back.   A sense of increased pelvic pressure.   A watery or bloody mucus discharge that comes from the vagina.  TREATMENT Depending on the length of the pregnancy and other circumstances, your health care provider may suggest bed rest. If necessary, there are medicines that can be given to stop contractions and to mature the fetal lungs. If labor happens before 34 weeks of pregnancy, a prolonged hospital stay may be recommended. Treatment depends on the condition of both you and the fetus.    WHAT SHOULD YOU DO IF YOU THINK YOU ARE IN PRETERM LABOR? Call your health care provider right away. You will need to go to the hospital to get checked immediately. HOW CAN YOU PREVENT PRETERM LABOR IN FUTURE PREGNANCIES? You should:   Stop smoking if you smoke.  Maintain healthy weight gain and avoid chemicals and drugs that are not necessary.  Be watchful for any type of infection.  Inform your health care provider if you have a known history of preterm labor.   This information is not  intended to replace advice given to you by your health care provider. Make sure you discuss any questions you have with your health care provider.   Document Released: 06/16/2003 Document Revised: 11/26/2012 Document Reviewed: 04/28/2012 Elsevier Interactive Patient Education [employer]2016 Elsevier Inc.

## 2015-12-06 NOTE — Progress Notes (Signed)
Low-risk OB appointment [id number] 5451w0d Estimated Date of Delivery: 01/31/16 BP 132/88   Pulse 76   Wt 181 lb (82.1 kg)   LMP 04/09/2015   BMI 32.06 kg/m   BP, weight, and urine reviewed.  Refer to obstetrical flow sheet for FH & FHR.  Reports good fm.  Denies regular uc's, lof, vb, or uti s/s. No complaints. BP high-normal, h/o GHTN, taking baby asa as directed. Denies ha, visual changes, ruq/epigastric pain, n/v.   Reviewed ptl s/s, pre-e s/s, fkc. Plan:  Continue routine obstetrical care  F/U in 1wk for OB appointment

## 2015-12-14 ENCOUNTER — Encounter: Payer: Self-pay | Admitting: Women's Health

## 2015-12-14 ENCOUNTER — Ambulatory Visit (INDEPENDENT_AMBULATORY_CARE_PROVIDER_SITE_OTHER): Payer: Medicaid Other | Admitting: Women's Health

## 2015-12-14 VITALS — BP 128/80 | HR 96 | Wt 181.0 lb

## 2015-12-14 DIAGNOSIS — O0993 Supervision of high risk pregnancy, unspecified, third trimester: Secondary | ICD-10-CM

## 2015-12-14 DIAGNOSIS — Z3493 Encounter for supervision of normal pregnancy, unspecified, third trimester: Secondary | ICD-10-CM

## 2015-12-14 DIAGNOSIS — O09523 Supervision of elderly multigravida, third trimester: Secondary | ICD-10-CM | POA: Diagnosis not present

## 2015-12-14 DIAGNOSIS — Z331 Pregnant state, incidental: Secondary | ICD-10-CM | POA: Diagnosis not present

## 2015-12-14 DIAGNOSIS — Z1389 Encounter for screening for other disorder: Secondary | ICD-10-CM | POA: Diagnosis not present

## 2015-12-14 DIAGNOSIS — Z3A32 32 weeks gestation of pregnancy: Secondary | ICD-10-CM

## 2015-12-14 LAB — POCT URINALYSIS DIPSTICK
Glucose, UA: NEGATIVE
KETONES UA: NEGATIVE
LEUKOCYTES UA: NEGATIVE
Nitrite, UA: NEGATIVE
Protein, UA: NEGATIVE
RBC UA: NEGATIVE

## 2015-12-14 NOTE — Patient Instructions (Signed)
Call the office ([phone]) or go to [facility] if:  You begin to have strong, frequent contractions  Your water breaks.  Sometimes it is a big gush of fluid, sometimes it is just a trickle that keeps getting your panties wet or running down your legs  You have vaginal bleeding.  It is normal to have a small amount of spotting if your cervix was checked.   You don't feel your baby moving like normal.  If you don't, get you something to eat and drink and lay down and focus on feeling your baby move.  You should feel at least 10 movements in 2 hours.  If you don't, you should call the office or go to [facility].    Preterm Labor Information Preterm labor is when labor starts at less than 37 weeks of pregnancy. The normal length of a pregnancy is 39 to 41 weeks. CAUSES Often, there is no identifiable underlying cause as to why a woman goes into preterm labor. One of the most common known causes of preterm labor is infection. Infections of the uterus, cervix, vagina, amniotic sac, bladder, kidney, or even the lungs (pneumonia) can cause labor to start. Other suspected causes of preterm labor include:   Urogenital infections, such as yeast infections and bacterial vaginosis.   Uterine abnormalities (uterine shape, uterine septum, fibroids, or bleeding from the placenta).   A cervix that has been operated on (it may fail to stay closed).   Malformations in the fetus.   Multiple gestations (twins, triplets, and so on).   Breakage of the amniotic sac.  RISK FACTORS  Having a previous history of preterm labor.   Having premature rupture of membranes (PROM).   Having a placenta that covers the opening of the cervix (placenta previa).   Having a placenta that separates from the uterus (placental abruption).   Having a cervix that is too weak to hold the fetus in the uterus (incompetent cervix).   Having too much fluid in the amniotic sac (polyhydramnios).   Taking  illegal drugs or smoking while pregnant.   Not gaining enough weight while pregnant.   Being younger than 18 and older than 35 years old.   Having a low socioeconomic status.   Being African American. SYMPTOMS Signs and symptoms of preterm labor include:   Menstrual-like cramps, abdominal pain, or back pain.  Uterine contractions that are regular, as frequent as six in an hour, regardless of their intensity (may be mild or painful).  Contractions that start on the top of the uterus and spread down to the lower abdomen and back.   A sense of increased pelvic pressure.   A watery or bloody mucus discharge that comes from the vagina.  TREATMENT Depending on the length of the pregnancy and other circumstances, your health care provider may suggest bed rest. If necessary, there are medicines that can be given to stop contractions and to mature the fetal lungs. If labor happens before 34 weeks of pregnancy, a prolonged hospital stay may be recommended. Treatment depends on the condition of both you and the fetus.  WHAT SHOULD YOU DO IF YOU THINK YOU ARE IN PRETERM LABOR? Call your health care provider right away. You will need to go to the hospital to get checked immediately. HOW CAN YOU PREVENT PRETERM LABOR IN FUTURE PREGNANCIES? You should:   Stop smoking if you smoke.  Maintain healthy weight gain and avoid chemicals and drugs that are not necessary.  Be watchful for   any type of infection.  Inform your health care provider if you have a known history of preterm labor.   This information is not intended to replace advice given to you by your health care provider. Make sure you discuss any questions you have with your health care provider.   Document Released: 06/16/2003 Document Revised: 11/26/2012 Document Reviewed: 04/28/2012 Elsevier Interactive Patient Education 2016 Elsevier Inc.  

## 2015-12-14 NOTE — Progress Notes (Signed)
Low-risk OB appointment [id number] 3068w1d Estimated Date of Delivery: 01/31/16 BP 128/80   Pulse 96   Wt 181 lb (82.1 kg)   LMP 04/09/2015   BMI 32.06 kg/m   BP, weight, and urine reviewed.  Refer to obstetrical flow sheet for FH & FHR.  Reports good fm.  Denies regular uc's, lof, vb, or uti s/s. Lots of braxton hick's, sometimes >4-6/hr, not as many this am. Erythematous flat area under Lt breast x 2wks, states will get better then flare up again- occ itching, feels more raw. Doesn't appear to be yeast, try hydrocortisone cream to see if helps.  Spec exam: cx visually closed and long, SVE deferred, normal appearing and nondorous d/c Reviewed ptl s/s, fkc. Plan:  Continue routine obstetrical care  F/U in 2wks for OB appointment

## 2015-12-20 ENCOUNTER — Ambulatory Visit (INDEPENDENT_AMBULATORY_CARE_PROVIDER_SITE_OTHER): Payer: Medicaid Other | Admitting: Women's Health

## 2015-12-20 ENCOUNTER — Encounter: Payer: Self-pay | Admitting: Women's Health

## 2015-12-20 VITALS — BP 138/92 | HR 94 | Wt 186.5 lb

## 2015-12-20 DIAGNOSIS — Z331 Pregnant state, incidental: Secondary | ICD-10-CM | POA: Diagnosis not present

## 2015-12-20 DIAGNOSIS — L299 Pruritus, unspecified: Secondary | ICD-10-CM

## 2015-12-20 DIAGNOSIS — Z1389 Encounter for screening for other disorder: Secondary | ICD-10-CM

## 2015-12-20 DIAGNOSIS — O09523 Supervision of elderly multigravida, third trimester: Secondary | ICD-10-CM | POA: Diagnosis not present

## 2015-12-20 DIAGNOSIS — IMO0001 Reserved for inherently not codable concepts without codable children: Secondary | ICD-10-CM

## 2015-12-20 DIAGNOSIS — R030 Elevated blood-pressure reading, without diagnosis of hypertension: Secondary | ICD-10-CM

## 2015-12-20 DIAGNOSIS — Z3A34 34 weeks gestation of pregnancy: Secondary | ICD-10-CM | POA: Diagnosis not present

## 2015-12-20 DIAGNOSIS — O0993 Supervision of high risk pregnancy, unspecified, third trimester: Secondary | ICD-10-CM | POA: Diagnosis not present

## 2015-12-20 DIAGNOSIS — Z3493 Encounter for supervision of normal pregnancy, unspecified, third trimester: Secondary | ICD-10-CM

## 2015-12-20 LAB — POCT URINALYSIS DIPSTICK
Blood, UA: NEGATIVE
Glucose, UA: NEGATIVE
KETONES UA: NEGATIVE
Nitrite, UA: NEGATIVE
PROTEIN UA: NEGATIVE

## 2015-12-20 NOTE — Patient Instructions (Signed)
Do not eat or drink anything after midnight tonight, come in morning for labs, lab opens at 8am  Call the office [phone]) or go to [facility] if:  You begin to have strong, frequent contractions  Your water breaks.  Sometimes it is a big gush of fluid, sometimes it is just a trickle that keeps getting your panties wet or running down your legs  You have vaginal bleeding.  It is normal to have a small amount of spotting if your cervix was checked.   You don't feel your baby moving like normal.  If you don't, get you something to eat and drink and lay down and focus on feeling your baby move.  You should feel at least 10 movements in 2 hours.  If you don't, you should call the office or [facility] to [facility].    Call the office [phone]) or go to [facility] for these signs of pre-eclampsia:  Severe headache that does not go away with Tylenol  Visual changes- seeing spots, double, blurred vision  Pain under your right breast or upper abdomen that does not go away with Tums or heartburn medicine  Nausea and/or vomiting  Severe swelling in your hands, feet, and face

## 2015-12-20 NOTE — Progress Notes (Signed)
Work-in Low-risk OB appointment [id number] 366w0d Estimated Date of Delivery: 01/31/16 BP (!) 138/92   Pulse 94   Wt 186 lb 8 oz (84.6 kg)   LMP 04/09/2015   BMI 33.04 kg/m   BP, weight, and urine reviewed.  Refer to obstetrical flow sheet for FH & FHR.  Reports good fm.  Denies regular uc's, lof, vb, or uti s/s. Itching x ~3wks, but got much morse last night- itching all over, worse at night. Has not been fasting today. Denies ha, visual changes, ruq/epigastric pain, n/v.   DTRs 2+, no clonus, trace BLE edema Reviewed ptl s/s, fkc, pre-e s/s. Can use hydrocortisone cream, cool showers and cool wash clothes Plan:  Pre-e and cholestasis labs  F/U in am for fasting bile acids, cmp, cbc, and will send urine p:c ratio today, then f/u in 2d for ob appt/bp check=

## 2015-12-22 ENCOUNTER — Ambulatory Visit (INDEPENDENT_AMBULATORY_CARE_PROVIDER_SITE_OTHER): Payer: Medicaid Other | Admitting: Advanced Practice Midwife

## 2015-12-22 VITALS — BP 134/88 | HR 100

## 2015-12-22 DIAGNOSIS — O0993 Supervision of high risk pregnancy, unspecified, third trimester: Secondary | ICD-10-CM | POA: Diagnosis not present

## 2015-12-22 DIAGNOSIS — O368190 Decreased fetal movements, unspecified trimester, not applicable or unspecified: Secondary | ICD-10-CM

## 2015-12-22 DIAGNOSIS — O09523 Supervision of elderly multigravida, third trimester: Secondary | ICD-10-CM | POA: Diagnosis not present

## 2015-12-22 DIAGNOSIS — Z3A35 35 weeks gestation of pregnancy: Secondary | ICD-10-CM | POA: Diagnosis not present

## 2015-12-22 DIAGNOSIS — Z331 Pregnant state, incidental: Secondary | ICD-10-CM | POA: Diagnosis not present

## 2015-12-22 DIAGNOSIS — Z3493 Encounter for supervision of normal pregnancy, unspecified, third trimester: Secondary | ICD-10-CM

## 2015-12-22 DIAGNOSIS — O368131 Decreased fetal movements, third trimester, fetus 1: Secondary | ICD-10-CM

## 2015-12-22 DIAGNOSIS — Z1389 Encounter for screening for other disorder: Secondary | ICD-10-CM

## 2015-12-22 LAB — CBC
Hematocrit: 38.1 % (ref 34.0–46.6)
Hemoglobin: 12.7 g/dL (ref 11.1–15.9)
MCH: 29.2 pg (ref 26.6–33.0)
MCHC: 33.3 g/dL (ref 31.5–35.7)
MCV: 88 fL (ref 79–97)
PLATELETS: 239 10*3/uL (ref 150–379)
RBC: 4.35 x10E6/uL (ref 3.77–5.28)
RDW: 13.1 % (ref 12.3–15.4)
WBC: 9.6 10*3/uL (ref 3.4–10.8)

## 2015-12-22 LAB — COMPREHENSIVE METABOLIC PANEL
ALT: 11 IU/L (ref 0–32)
AST: 15 IU/L (ref 0–40)
Albumin/Globulin Ratio: 1.2 (ref 1.2–2.2)
Albumin: 3.5 g/dL (ref 3.5–5.5)
Alkaline Phosphatase: 84 IU/L (ref 39–117)
BUN/Creatinine Ratio: 21 (ref 9–23)
BUN: 11 mg/dL (ref 6–20)
Bilirubin Total: <0.2 mg/dL (ref 0.0–1.2)
CALCIUM: 9.7 mg/dL (ref 8.7–10.2)
CO2: 23 mmol/L (ref 18–29)
Chloride: 103 mmol/L (ref 96–106)
Creatinine, Ser: 0.52 mg/dL — ABNORMAL LOW (ref 0.57–1.00)
GFR, EST AFRICAN AMERICAN: 143 mL/min/{1.73_m2} (ref >59)
GFR, EST NON AFRICAN AMERICAN: 124 mL/min/{1.73_m2} (ref >59)
GLUCOSE: 78 mg/dL (ref 65–99)
Globulin, Total: 2.9 g/dL (ref 1.5–4.5)
Potassium: 5.1 mmol/L (ref 3.5–5.2)
Sodium: 142 mmol/L (ref 134–144)
TOTAL PROTEIN: 6.4 g/dL (ref 6.0–8.5)

## 2015-12-22 LAB — POCT URINALYSIS DIPSTICK
Glucose, UA: NEGATIVE
KETONES UA: NEGATIVE
Leukocytes, UA: NEGATIVE
Nitrite, UA: NEGATIVE
RBC UA: NEGATIVE

## 2015-12-22 LAB — PROTEIN / CREATININE RATIO, URINE
Creatinine, Urine: 70.2 mg/dL
PROTEIN UR: 9.6 mg/dL
PROTEIN/CREAT RATIO: 137 mg/g{creat} (ref 0–200)

## 2015-12-22 LAB — BILE ACIDS, TOTAL: BILE ACIDS TOTAL: 8.7 umol/L (ref 4.7–24.5)

## 2015-12-22 NOTE — Progress Notes (Signed)
[id number] 4445w2d Estimated Date of Delivery: 01/31/16  Last menstrual period 04/09/2015.   Here for BP recheck. Has had some early borderline BP;s Today is normal  BP weight and urine results all reviewed and noted.  PR/CR ratio/Bile salts not back yet.  Still having some nighttime itching.   Says baby has only moved twice today.  NST very reactive.   Please refer to the obstetrical flow sheet for the fundal height and fetal heart rate documentation:  Patient reports good fetal movement, denies any bleeding and no rupture of membranes symptoms or regular contractions. Patient is without complaints. She still has some bouts of blurry vision, no more chest pain.  All questions were answered.  No orders of the defined types were placed in this encounter.   Plan:  Continued routine obstetrical care,   F/U Tuesday (as scheudled) to BP check

## 2015-12-22 NOTE — Patient Instructions (Signed)

## 2015-12-27 ENCOUNTER — Encounter: Payer: Self-pay | Admitting: Women's Health

## 2015-12-27 ENCOUNTER — Ambulatory Visit (INDEPENDENT_AMBULATORY_CARE_PROVIDER_SITE_OTHER): Payer: Medicaid Other | Admitting: Women's Health

## 2015-12-27 VITALS — BP 120/76 | HR 102 | Wt 184.0 lb

## 2015-12-27 DIAGNOSIS — O23593 Infection of other part of genital tract in pregnancy, third trimester: Secondary | ICD-10-CM | POA: Diagnosis not present

## 2015-12-27 DIAGNOSIS — O0993 Supervision of high risk pregnancy, unspecified, third trimester: Secondary | ICD-10-CM | POA: Diagnosis not present

## 2015-12-27 DIAGNOSIS — Z1389 Encounter for screening for other disorder: Secondary | ICD-10-CM

## 2015-12-27 DIAGNOSIS — Z3493 Encounter for supervision of normal pregnancy, unspecified, third trimester: Secondary | ICD-10-CM

## 2015-12-27 DIAGNOSIS — O09523 Supervision of elderly multigravida, third trimester: Secondary | ICD-10-CM

## 2015-12-27 DIAGNOSIS — Z23 Encounter for immunization: Secondary | ICD-10-CM

## 2015-12-27 DIAGNOSIS — Z3A35 35 weeks gestation of pregnancy: Secondary | ICD-10-CM | POA: Diagnosis not present

## 2015-12-27 DIAGNOSIS — Z331 Pregnant state, incidental: Secondary | ICD-10-CM

## 2015-12-27 DIAGNOSIS — L299 Pruritus, unspecified: Secondary | ICD-10-CM

## 2015-12-27 LAB — POCT URINALYSIS DIPSTICK
Blood, UA: NEGATIVE
Glucose, UA: NEGATIVE
KETONES UA: NEGATIVE
Nitrite, UA: NEGATIVE
PROTEIN UA: NEGATIVE

## 2015-12-27 NOTE — Progress Notes (Signed)
Low-risk OB appointment [id number] 5363w0d Estimated Date of Delivery: 01/31/16 BP 120/76   Pulse (!) 102   Wt 184 lb (83.5 kg)   LMP 04/09/2015   BMI 32.59 kg/m   BP, weight, and urine reviewed.  Refer to obstetrical flow sheet for FH & FHR.  Reports good fm.  Denies regular uc's, lof, vb, or uti s/s. Still itching, worse at night, hasn't tried otc creams. No rash. Bile acids/cmp normal last week. Not fasting today- will recheck both tomorrow am. Can try otc hydrocortisone or benadryl cream, cool showers/wash cloths- avoid hot BP much better today Reviewed ptl s/s, fkc. Plan:  Continue routine obstetrical care  F/U in am for fasting bile acids/cmp, then 1wk for OB appointment

## 2015-12-27 NOTE — Patient Instructions (Addendum)
Hydrocortisone or benadryl cream Cool showers and wash cloths Avoid hot showers/baths  Nothing to eat or drink after midnight tonight  Call the office [phone]) or go to [facility] if:  You begin to have strong, frequent contractions  Your water breaks.  Sometimes it is a big gush of fluid, sometimes it is just a trickle that keeps getting your panties wet or running down your legs  You have vaginal bleeding.  It is normal to have a small amount of spotting if your cervix was checked.   You don't feel your baby moving like normal.  If you don't, get you something to eat and drink and lay down and focus on feeling your baby move.  You should feel at least 10 movements in 2 hours.  If you don't, you should call the office or go to [facility].    Preterm Labor Information Preterm labor is when labor starts at less than 37 weeks of pregnancy. The normal length of a pregnancy is 39 to 41 weeks. CAUSES Often, there is no identifiable underlying cause as to why a woman goes into preterm labor. One of the most common known causes of preterm labor is infection. Infections of the uterus, cervix, vagina, amniotic sac, bladder, kidney, or even the lungs (pneumonia) can cause labor to start. Other suspected causes of preterm labor include:   Urogenital infections, such as yeast infections and bacterial vaginosis.   Uterine abnormalities (uterine shape, uterine septum, fibroids, or bleeding from the placenta).   A cervix that has been operated on (it may fail to stay closed).   Malformations in the fetus.   Multiple gestations (twins, triplets, and so on).   Breakage of the amniotic sac.  RISK FACTORS  Having a previous history of preterm labor.   Having premature rupture of membranes (PROM).   Having a placenta that covers the opening of the cervix (placenta previa).   Having a placenta that separates from the uterus (placental abruption).   Having a cervix that is  too weak to hold the fetus in the uterus (incompetent cervix).   Having too much fluid in the amniotic sac (polyhydramnios).   Taking illegal drugs or smoking while pregnant.   Not gaining enough weight while pregnant.   Being younger than 7818 and older than 36 years old.   Having a low socioeconomic status.   Being African American. SYMPTOMS Signs and symptoms of preterm labor include:   Menstrual-like cramps, abdominal pain, or back pain.  Uterine contractions that are regular, as frequent as six in an hour, regardless of their intensity (may be mild or painful).  Contractions that start on the top of the uterus and spread down to the lower abdomen and back.   A sense of increased pelvic pressure.   A watery or bloody mucus discharge that comes from the vagina.  TREATMENT Depending on the length of the pregnancy and other circumstances, your health care provider may suggest bed rest. If necessary, there are medicines that can be given to stop contractions and to mature the fetal lungs. If labor happens before 34 weeks of pregnancy, a prolonged hospital stay may be recommended. Treatment depends on the condition of both you and the fetus.  WHAT SHOULD YOU DO IF YOU THINK YOU ARE IN PRETERM LABOR? Call your health care provider right away. You will need to go to the hospital to get checked immediately. HOW CAN YOU PREVENT PRETERM LABOR IN FUTURE PREGNANCIES? You should:   Stop  smoking if you smoke.  Maintain healthy weight gain and avoid chemicals and drugs that are not necessary.  Be watchful for any type of infection.  Inform your health care provider if you have a known history of preterm labor.   This information is not intended to replace advice given to you by your health care provider. Make sure you discuss any questions you have with your health care provider.   Document Released: 06/16/2003 Document Revised: 11/26/2012 Document Reviewed:  04/28/2012 Elsevier Interactive Patient Education [employer]

## 2015-12-28 ENCOUNTER — Other Ambulatory Visit: Payer: Medicaid Other

## 2015-12-29 LAB — COMPREHENSIVE METABOLIC PANEL
A/G RATIO: 1.2 (ref 1.2–2.2)
ALT: 11 IU/L (ref 0–32)
AST: 13 IU/L (ref 0–40)
Albumin: 3.3 g/dL — ABNORMAL LOW (ref 3.5–5.5)
Alkaline Phosphatase: 89 IU/L (ref 39–117)
BUN/Creatinine Ratio: 20 (ref 9–23)
BUN: 10 mg/dL (ref 6–20)
Bilirubin Total: <0.2 mg/dL (ref 0.0–1.2)
CALCIUM: 9.5 mg/dL (ref 8.7–10.2)
CO2: 25 mmol/L (ref 18–29)
Chloride: 101 mmol/L (ref 96–106)
Creatinine, Ser: 0.51 mg/dL — ABNORMAL LOW (ref 0.57–1.00)
GFR calc Af Amer: 144 mL/min/{1.73_m2} (ref >59)
GFR, EST NON AFRICAN AMERICAN: 125 mL/min/{1.73_m2} (ref >59)
GLUCOSE: 86 mg/dL (ref 65–99)
Globulin, Total: 2.7 g/dL (ref 1.5–4.5)
Potassium: 4.8 mmol/L (ref 3.5–5.2)
SODIUM: 138 mmol/L (ref 134–144)
TOTAL PROTEIN: 6 g/dL (ref 6.0–8.5)

## 2015-12-29 LAB — BILE ACIDS, TOTAL: Bile Acids Total: 7.3 umol/L (ref 4.7–24.5)

## 2016-01-03 ENCOUNTER — Encounter: Payer: Medicaid Other | Admitting: Women's Health

## 2016-01-05 ENCOUNTER — Encounter: Payer: Self-pay | Admitting: Women's Health

## 2016-01-05 ENCOUNTER — Ambulatory Visit (INDEPENDENT_AMBULATORY_CARE_PROVIDER_SITE_OTHER): Payer: Medicaid Other | Admitting: Women's Health

## 2016-01-05 VITALS — BP 130/80 | HR 78 | Wt 189.4 lb

## 2016-01-05 DIAGNOSIS — Z1389 Encounter for screening for other disorder: Secondary | ICD-10-CM

## 2016-01-05 DIAGNOSIS — O322XX0 Maternal care for transverse and oblique lie, not applicable or unspecified: Secondary | ICD-10-CM | POA: Insufficient documentation

## 2016-01-05 DIAGNOSIS — O09523 Supervision of elderly multigravida, third trimester: Secondary | ICD-10-CM

## 2016-01-05 DIAGNOSIS — O0993 Supervision of high risk pregnancy, unspecified, third trimester: Secondary | ICD-10-CM | POA: Diagnosis not present

## 2016-01-05 DIAGNOSIS — Z3493 Encounter for supervision of normal pregnancy, unspecified, third trimester: Secondary | ICD-10-CM

## 2016-01-05 DIAGNOSIS — Z331 Pregnant state, incidental: Secondary | ICD-10-CM

## 2016-01-05 DIAGNOSIS — O368130 Decreased fetal movements, third trimester, not applicable or unspecified: Secondary | ICD-10-CM | POA: Diagnosis not present

## 2016-01-05 DIAGNOSIS — O26893 Other specified pregnancy related conditions, third trimester: Secondary | ICD-10-CM

## 2016-01-05 DIAGNOSIS — Z3A37 37 weeks gestation of pregnancy: Secondary | ICD-10-CM | POA: Diagnosis not present

## 2016-01-05 LAB — POCT URINALYSIS DIPSTICK
KETONES UA: NEGATIVE
LEUKOCYTES UA: NEGATIVE
NITRITE UA: NEGATIVE
PROTEIN UA: NEGATIVE
RBC UA: NEGATIVE

## 2016-01-05 NOTE — Patient Instructions (Signed)
Call the office ([phone]) or go to [facility] if:  You begin to have strong, frequent contractions  Your water breaks.  Sometimes it is a big gush of fluid, sometimes it is just a trickle that keeps getting your panties wet or running down your legs  You have vaginal bleeding.  It is normal to have a small amount of spotting if your cervix was checked.   You don't feel your baby moving like normal.  If you don't, get you something to eat and drink and lay down and focus on feeling your baby move.  You should feel at least 10 movements in 2 hours.  If you don't, you should call the office or go to [facility].    Preterm Labor Information Preterm labor is when labor starts at less than 37 weeks of pregnancy. The normal length of a pregnancy is 39 to 41 weeks. CAUSES Often, there is no identifiable underlying cause as to why a woman goes into preterm labor. One of the most common known causes of preterm labor is infection. Infections of the uterus, cervix, vagina, amniotic sac, bladder, kidney, or even the lungs (pneumonia) can cause labor to start. Other suspected causes of preterm labor include:   Urogenital infections, such as yeast infections and bacterial vaginosis.   Uterine abnormalities (uterine shape, uterine septum, fibroids, or bleeding from the placenta).   A cervix that has been operated on (it may fail to stay closed).   Malformations in the fetus.   Multiple gestations (twins, triplets, and so on).   Breakage of the amniotic sac.  RISK FACTORS  Having a previous history of preterm labor.   Having premature rupture of membranes (PROM).   Having a placenta that covers the opening of the cervix (placenta previa).   Having a placenta that separates from the uterus (placental abruption).   Having a cervix that is too weak to hold the fetus in the uterus (incompetent cervix).   Having too much fluid in the amniotic sac (polyhydramnios).   Taking  illegal drugs or smoking while pregnant.   Not gaining enough weight while pregnant.   Being younger than 18 and older than 35 years old.   Having a low socioeconomic status.   Being African American. SYMPTOMS Signs and symptoms of preterm labor include:   Menstrual-like cramps, abdominal pain, or back pain.  Uterine contractions that are regular, as frequent as six in an hour, regardless of their intensity (may be mild or painful).  Contractions that start on the top of the uterus and spread down to the lower abdomen and back.   A sense of increased pelvic pressure.   A watery or bloody mucus discharge that comes from the vagina.  TREATMENT Depending on the length of the pregnancy and other circumstances, your health care provider may suggest bed rest. If necessary, there are medicines that can be given to stop contractions and to mature the fetal lungs. If labor happens before 34 weeks of pregnancy, a prolonged hospital stay may be recommended. Treatment depends on the condition of both you and the fetus.  WHAT SHOULD YOU DO IF YOU THINK YOU ARE IN PRETERM LABOR? Call your health care provider right away. You will need to go to the hospital to get checked immediately. HOW CAN YOU PREVENT PRETERM LABOR IN FUTURE PREGNANCIES? You should:   Stop smoking if you smoke.  Maintain healthy weight gain and avoid chemicals and drugs that are not necessary.  Be watchful for   any type of infection.  Inform your health care provider if you have a known history of preterm labor.   This information is not intended to replace advice given to you by your health care provider. Make sure you discuss any questions you have with your health care provider.   Document Released: 06/16/2003 Document Revised: 11/26/2012 Document Reviewed: 04/28/2012 Elsevier Interactive Patient Education 2016 Elsevier Inc.  

## 2016-01-05 NOTE — Progress Notes (Signed)
Low-risk OB appointment [id number] 3153w2d Estimated Date of Delivery: 01/31/16 BP 130/80   Pulse 78   Wt 189 lb 6.4 oz (85.9 kg)   LMP 04/09/2015   BMI 33.55 kg/m   BP, weight, and urine reviewed.  Refer to obstetrical flow sheet for FH & FHR.  Reports decreased fm this am.  Denies regular uc's, lof, vb, or uti s/s. LBP and pressure w/ irregular contractions. Still itching, have checked cmp/bile acids weekly x 2; bile acids 8.7 (9/13) then 7.3 (9/20), CMP also normal x 2. Discussed w/ JVF- recommends waiting until next week to recheck. Pt to let us know if anything changes. To try eucerin/aquaphor, cool showers/wash cloths, hydrocortisone or benadryl cream.  NST beautifully reactive/Cat I SVE per request: 1.5/thick/unable to feel presenting part, baby transverse head to maternal Rt w/ back up. Discussed ECV, pt declines d/t her fears over potential complications she has previously read about, addressed all fears, still states would want c/s. Reviewed ptl s/s, fkc. Plan:  Continue routine obstetrical care  F/U on Monday for OB appointment, presentation check

## 2016-01-09 ENCOUNTER — Ambulatory Visit (INDEPENDENT_AMBULATORY_CARE_PROVIDER_SITE_OTHER): Payer: Medicaid Other | Admitting: Obstetrics & Gynecology

## 2016-01-09 VITALS — BP 100/70 | HR 100 | Wt 189.0 lb

## 2016-01-09 DIAGNOSIS — Z3A37 37 weeks gestation of pregnancy: Secondary | ICD-10-CM

## 2016-01-09 DIAGNOSIS — Z1159 Encounter for screening for other viral diseases: Secondary | ICD-10-CM

## 2016-01-09 DIAGNOSIS — Z3483 Encounter for supervision of other normal pregnancy, third trimester: Secondary | ICD-10-CM

## 2016-01-09 DIAGNOSIS — Z331 Pregnant state, incidental: Secondary | ICD-10-CM

## 2016-01-09 DIAGNOSIS — Z1389 Encounter for screening for other disorder: Secondary | ICD-10-CM | POA: Diagnosis not present

## 2016-01-09 DIAGNOSIS — O09523 Supervision of elderly multigravida, third trimester: Secondary | ICD-10-CM

## 2016-01-09 DIAGNOSIS — O0993 Supervision of high risk pregnancy, unspecified, third trimester: Secondary | ICD-10-CM

## 2016-01-09 DIAGNOSIS — Z3493 Encounter for supervision of normal pregnancy, unspecified, third trimester: Secondary | ICD-10-CM

## 2016-01-09 DIAGNOSIS — Z118 Encounter for screening for other infectious and parasitic diseases: Secondary | ICD-10-CM

## 2016-01-09 DIAGNOSIS — Z3685 Encounter for antenatal screening for Streptococcus B: Secondary | ICD-10-CM

## 2016-01-09 LAB — POCT URINALYSIS DIPSTICK
Blood, UA: NEGATIVE
GLUCOSE UA: NEGATIVE
Leukocytes, UA: NEGATIVE
NITRITE UA: NEGATIVE

## 2016-01-09 LAB — OB RESULTS CONSOLE GBS: STREP GROUP B AG: POSITIVE

## 2016-01-09 NOTE — Progress Notes (Signed)
[id number] 5168w6d Estimated Date of Delivery: 01/31/16  Blood pressure 100/70, pulse 100, weight 189 lb (85.7 kg), last menstrual period 04/09/2015.   BP weight and urine results all reviewed and noted.  Please refer to the obstetrical flow sheet for the fundal height and fetal heart rate documentation:  Patient reports good fetal movement, denies any bleeding and no rupture of membranes symptoms or regular contractions. Patient is without complaints. All questions were answered.  Orders Placed This Encounter  Procedures  . GC/Chlamydia Probe Amp  . Strep Gp B NAA  . POCT urinalysis dipstick    Plan:  Continued routine obstetrical care, slightly to the right of straight vertex, but you can tell it is going to be unstable  Return in about 1 week (around 01/16/2016) for LROB.

## 2016-01-11 LAB — GC/CHLAMYDIA PROBE AMP
Chlamydia trachomatis, NAA: NEGATIVE
NEISSERIA GONORRHOEAE BY PCR: NEGATIVE

## 2016-01-11 LAB — STREP GP B NAA: STREP GROUP B AG: POSITIVE — AB

## 2016-01-13 ENCOUNTER — Inpatient Hospital Stay (HOSPITAL_COMMUNITY)
Admission: AD | Admit: 2016-01-13 | Discharge: 2016-01-14 | Disposition: A | Discharge status: Home / Self Care | Payer: Medicaid Other | Source: Ambulatory Visit | Attending: Obstetrics & Gynecology | Admitting: Obstetrics & Gynecology

## 2016-01-13 ENCOUNTER — Encounter (HOSPITAL_COMMUNITY): Payer: Self-pay | Admitting: *Deleted

## 2016-01-13 DIAGNOSIS — Z3A37 37 weeks gestation of pregnancy: Secondary | ICD-10-CM | POA: Insufficient documentation

## 2016-01-13 DIAGNOSIS — Z3493 Encounter for supervision of normal pregnancy, unspecified, third trimester: Secondary | ICD-10-CM | POA: Diagnosis not present

## 2016-01-13 LAB — URINALYSIS, ROUTINE W REFLEX MICROSCOPIC
Bilirubin Urine: NEGATIVE
GLUCOSE, UA: 100 mg/dL — AB
Hgb urine dipstick: NEGATIVE
Ketones, ur: NEGATIVE mg/dL
Nitrite: NEGATIVE
PROTEIN: NEGATIVE mg/dL
SPECIFIC GRAVITY, URINE: 1.01 (ref 1.005–1.030)
pH: 6.5 (ref 5.0–8.0)

## 2016-01-13 LAB — URINE MICROSCOPIC-ADD ON: RBC / HPF: NONE SEEN RBC/hpf (ref 0–5)

## 2016-01-13 NOTE — MAU Note (Addendum)
Having some ctxs and pelvic pressure since Thurs. Denies bleeding. Leaked fld yesterday but none today. Unsure if urine or not. Baby had been transverse but head was down this past Monday.

## 2016-01-14 DIAGNOSIS — Z3493 Encounter for supervision of normal pregnancy, unspecified, third trimester: Secondary | ICD-10-CM | POA: Diagnosis not present

## 2016-01-17 ENCOUNTER — Encounter: Payer: Self-pay | Admitting: Advanced Practice Midwife

## 2016-01-17 ENCOUNTER — Ambulatory Visit (INDEPENDENT_AMBULATORY_CARE_PROVIDER_SITE_OTHER): Payer: Medicaid Other | Admitting: Advanced Practice Midwife

## 2016-01-17 VITALS — BP 138/82 | HR 80 | Wt 192.0 lb

## 2016-01-17 DIAGNOSIS — Z3A38 38 weeks gestation of pregnancy: Secondary | ICD-10-CM | POA: Diagnosis not present

## 2016-01-17 DIAGNOSIS — O09523 Supervision of elderly multigravida, third trimester: Secondary | ICD-10-CM | POA: Diagnosis not present

## 2016-01-17 DIAGNOSIS — Z1389 Encounter for screening for other disorder: Secondary | ICD-10-CM

## 2016-01-17 DIAGNOSIS — O0993 Supervision of high risk pregnancy, unspecified, third trimester: Secondary | ICD-10-CM | POA: Diagnosis not present

## 2016-01-17 DIAGNOSIS — Z331 Pregnant state, incidental: Secondary | ICD-10-CM

## 2016-01-17 DIAGNOSIS — Z3483 Encounter for supervision of other normal pregnancy, third trimester: Secondary | ICD-10-CM

## 2016-01-17 LAB — POCT URINALYSIS DIPSTICK
Blood, UA: NEGATIVE
GLUCOSE UA: NEGATIVE
KETONES UA: NEGATIVE
LEUKOCYTES UA: NEGATIVE
Nitrite, UA: NEGATIVE
PROTEIN UA: NEGATIVE

## 2016-01-17 NOTE — Patient Instructions (Signed)

## 2016-01-17 NOTE — Progress Notes (Signed)
[id number] 6134w0d Estimated Date of Delivery: 01/31/16  Blood pressure 138/82, pulse 80, weight 192 lb (87.1 kg), last menstrual period 04/09/2015.   BP weight and urine results all reviewed and noted.  Please refer to the obstetrical flow sheet for the fundal height and fetal heart rate documentation:  Patient reports good fetal movement, denies any bleeding and no rupture of membranes symptoms or regular contractions. Patient is without complaints.Baby vertex today All questions were answered.  Orders Placed This Encounter  Procedures  . POCT urinalysis dipstick    Plan:  Continued routine obstetrical care,   Return in about 1 week (around 01/24/2016) for LROB.

## 2016-01-18 ENCOUNTER — Encounter (HOSPITAL_COMMUNITY): Payer: Self-pay

## 2016-01-18 ENCOUNTER — Inpatient Hospital Stay (HOSPITAL_COMMUNITY)
Admission: AD | Admit: 2016-01-18 | Discharge: 2016-01-18 | Disposition: A | Discharge status: Home / Self Care | Payer: Medicaid Other | Source: Ambulatory Visit | Attending: Obstetrics and Gynecology | Admitting: Obstetrics and Gynecology

## 2016-01-18 DIAGNOSIS — O1203 Gestational edema, third trimester: Secondary | ICD-10-CM | POA: Diagnosis not present

## 2016-01-18 DIAGNOSIS — R51 Headache: Secondary | ICD-10-CM | POA: Insufficient documentation

## 2016-01-18 DIAGNOSIS — R1011 Right upper quadrant pain: Secondary | ICD-10-CM | POA: Insufficient documentation

## 2016-01-18 DIAGNOSIS — H538 Other visual disturbances: Secondary | ICD-10-CM | POA: Insufficient documentation

## 2016-01-18 DIAGNOSIS — Z3A38 38 weeks gestation of pregnancy: Secondary | ICD-10-CM | POA: Diagnosis not present

## 2016-01-18 DIAGNOSIS — R030 Elevated blood-pressure reading, without diagnosis of hypertension: Secondary | ICD-10-CM | POA: Diagnosis not present

## 2016-01-18 DIAGNOSIS — Z7982 Long term (current) use of aspirin: Secondary | ICD-10-CM | POA: Insufficient documentation

## 2016-01-18 DIAGNOSIS — R110 Nausea: Secondary | ICD-10-CM

## 2016-01-18 DIAGNOSIS — Z87891 Personal history of nicotine dependence: Secondary | ICD-10-CM | POA: Diagnosis not present

## 2016-01-18 DIAGNOSIS — O26893 Other specified pregnancy related conditions, third trimester: Secondary | ICD-10-CM | POA: Insufficient documentation

## 2016-01-18 DIAGNOSIS — R600 Localized edema: Secondary | ICD-10-CM

## 2016-01-18 LAB — URINE MICROSCOPIC-ADD ON: WBC, UA: NONE SEEN WBC/hpf (ref 0–5)

## 2016-01-18 LAB — CBC
HCT: 33.1 % — ABNORMAL LOW (ref 36.0–46.0)
HEMOGLOBIN: 11.3 g/dL — AB (ref 12.0–15.0)
MCH: 29 pg (ref 26.0–34.0)
MCHC: 34.1 g/dL (ref 30.0–36.0)
MCV: 84.9 fL (ref 78.0–100.0)
Platelets: 240 10*3/uL (ref 150–400)
RBC: 3.9 MIL/uL (ref 3.87–5.11)
RDW: 13.9 % (ref 11.5–15.5)
WBC: 11.1 10*3/uL — ABNORMAL HIGH (ref 4.0–10.5)

## 2016-01-18 LAB — COMPREHENSIVE METABOLIC PANEL
ALK PHOS: 91 U/L (ref 38–126)
ALT: 20 U/L (ref 14–54)
ANION GAP: 5 (ref 5–15)
AST: 17 U/L (ref 15–41)
Albumin: 2.7 g/dL — ABNORMAL LOW (ref 3.5–5.0)
BUN: 12 mg/dL (ref 6–20)
CALCIUM: 9.1 mg/dL (ref 8.9–10.3)
CO2: 22 mmol/L (ref 22–32)
CREATININE: 0.37 mg/dL — AB (ref 0.44–1.00)
Chloride: 105 mmol/L (ref 101–111)
GFR calc Af Amer: >60 mL/min (ref >60)
GFR calc non Af Amer: >60 mL/min (ref >60)
GLUCOSE: 96 mg/dL (ref 65–99)
Potassium: 3.8 mmol/L (ref 3.5–5.1)
SODIUM: 132 mmol/L — AB (ref 135–145)
Total Bilirubin: 0.6 mg/dL (ref 0.3–1.2)
Total Protein: 6.7 g/dL (ref 6.5–8.1)

## 2016-01-18 LAB — PROTEIN / CREATININE RATIO, URINE: Creatinine, Urine: 36 mg/dL

## 2016-01-18 LAB — URINALYSIS, ROUTINE W REFLEX MICROSCOPIC
Bilirubin Urine: NEGATIVE
Glucose, UA: NEGATIVE mg/dL
KETONES UR: NEGATIVE mg/dL
LEUKOCYTES UA: NEGATIVE
NITRITE: NEGATIVE
PROTEIN: NEGATIVE mg/dL
Specific Gravity, Urine: 1.01 (ref 1.005–1.030)
pH: 6 (ref 5.0–8.0)

## 2016-01-18 NOTE — MAU Provider Note (Signed)
History     CSN: [id number]  Arrival date and time: 01/18/16 2129    No chief complaint on file.  HPI  [name] is a 35 year old G8P5025 at 6142w1d presenting to the MAU with multiple complaints including RUQ pain, an episode of blurred vision, headache, nausea, left lower extremity swelling, and bilateral hand swelling, which have all occurred in the last day. She was at the grocery store earlier this evening and noted a few seconds of blurred vision and dizziness, which resolved on their own. When she got back from the grocery store, her neighbor came over and checked her blood pressure, which was 148/98. She has had an associated "nagging" headache for the last day. She has also had left lower extremity swelling, which started this evening.  She denies any contractions, vaginal bleeding, or leakage of fluids. She endorses good fetal movement.  Past Medical History:  Diagnosis Date  . Anemia   . Anxiety "fast hearbeat"   took Benadryl at end of pregnancy  . Back pain affecting pregnancy in first trimester 05/27/2015  . Complication of anesthesia    Epidural 1 sided  . Depression    During 1 pregnancy  . Diabetes mellitus without complication (HCC)   . Gestational diabetes    just started checking BS 03/05/12  . Headache(784.0)   . Hematuria 05/27/2015  . Irregular periods   . Nausea 01/27/2015  . Pregnancy induced hypertension   . Pregnant 01/27/2015  . Recurrent upper respiratory infection (URI)    Mostly when pregnant  . Spotting affecting pregnancy in first trimester 01/27/2015    Past Surgical History:  Procedure Laterality Date  . MULTIPLE TOOTH EXTRACTIONS    . WISDOM TOOTH EXTRACTION      Family History  Problem Relation Age of Onset  . Heart disease Maternal Grandmother   . Cancer Maternal Grandmother     brain  . Heart failure Maternal Grandmother   . Cancer Paternal Grandmother     brain  . Hypertension Mother   . Anxiety disorder Mother   . Depression  Mother   . Hypertension Father   . Anxiety disorder Sister   . ADD / ADHD Daughter   . Asthma Daughter   . ADD / ADHD Daughter   . Seizures Daughter   . ADD / ADHD Daughter   . ADD / ADHD Daughter     Social History  Substance Use Topics  . Smoking status: Former Smoker    Packs/day: 0.10    Years: 0.50    Types: Cigarettes    Start date: 02/19/1998    Quit date: 09/17/1998  . Smokeless tobacco: Never Used  . Alcohol use No     Comment: not while pregnant    Allergies: No Known Allergies  Prescriptions Prior to Admission  Medication Sig Dispense Refill Last Dose  . acetaminophen (TYLENOL) 325 MG tablet Take 650 mg by mouth every 6 (six) hours as needed for mild pain or headache.    Past Week at Unknown time  . aspirin EC 81 MG tablet Take 81 mg by mouth daily.   01/17/2016 at Unknown time  . citalopram (CELEXA) 10 MG tablet Take 10 mg by mouth daily.   01/17/2016 at Unknown time  . fluticasone (FLONASE) 50 MCG/ACT nasal spray Place 1 spray into both nostrils daily.   01/17/2016 at Unknown time  . loratadine (CLARITIN) 10 MG tablet Take 10 mg by mouth at bedtime.    01/17/2016 at Unknown time  .  Prenatal Vit-Fe Fumarate-FA (PRENATAL MULTIVITAMIN) TABS tablet Take 1 tablet by mouth daily.   01/17/2016 at Unknown time    Review of Systems  Constitutional: Positive for chills. Negative for fever.  Eyes: Positive for blurred vision.  Respiratory: Negative for shortness of breath.   Cardiovascular: Negative for chest pain.  Gastrointestinal: Positive for abdominal pain and nausea. Negative for diarrhea, heartburn and vomiting.  Genitourinary: Negative for dysuria.  Musculoskeletal: Negative for myalgias.  Skin: Negative for rash.  Neurological: Positive for dizziness and headaches. Negative for weakness.  Endo/Heme/Allergies: Negative for environmental allergies.   Physical Exam   Blood pressure 119/81, pulse 102, temperature 98.7 F (37.1 C), temperature source Oral,  resp. rate 18, height 5\' 4"  (1.626 m), weight 88 kg (194 lb), last menstrual period 04/09/2015, SpO2 96 %.  Physical Exam  Nursing note and vitals reviewed. Constitutional: She is oriented to person, place, and time. She appears well-developed and well-nourished. No distress.  Eyes: No scleral icterus.  Neck: Normal range of motion.  Cardiovascular: Normal rate.   Respiratory: Effort normal.  GI: Soft. There is no tenderness. There is no rebound and no guarding.  gravid  Musculoskeletal: Normal range of motion.  Left lower extremity is edematous compared to the right. Left posterior calf is tender to palpation. Positive Homan's sign.  Neurological: She is alert and oriented to person, place, and time.  Skin: Skin is warm and dry. No rash noted.    MAU Course  Procedures  MDM Tayvia is a 35 year old [id number] at [redacted]w[redacted]d presenting to the MAU with multiple different symptoms. She had an episode of blurred vision with dizziness this evening and has been having a headache throughout the day. She also had an elevated BP reading at home to 148/98. She has had intermittent mildly elevated blood pressures in the office. Her BPs in the MAU were 124/94 and 119/81. Given her symptoms and her elevated blood pressure at home, will rule out pre-eclampsia with CBC, CMP, and urine protein/Cr ratio. Labs performed and were unremarkable. Her blood pressures were normal. She can be discharged with follow-up in the office.  Pt is also complaining of left lower extremity edema that started this morning. On exam, her left lower extremity is more edematous than the right. Her left posterior calf is tender to palpation. She has a positive Homan's sign on the left. Concern for DVT.  Assessment and Plan  1. Elevated BP, headache, blurred vision, RUQ pain- BPs normal in the MAU.  - CBC, CMP, and urine protein/Cr ratio were all unremarkable - Return precautions discussed - Continue routine prenatal care  2. Left  lower extremity edema- concern for DVT, given her unilateral lower extremity edema, left posterior calf pain, and positive Homan's sign on the left. - Cannot do doppler US in the middle of the night. I ordered a doppler US to be performed at [facility] tomorrow morning at 8AM.   Hilton Sinclair 01/18/2016, 10:54 PM   CNM attestation:  I have seen and examined this patient; I agree with above documentation in the resident's note.   Charlotta Lapaglia is a 35 y.o. [id number] reporting multiple complaints as noted above +FM, denies LOF, VB, contractions, vaginal discharge.  PE: BP 120/79   Pulse 96   Temp 98 F (36.7 C)   Resp 16   Ht 5\' 4"  (1.626 m)   Wt 88 kg (194 lb)   LMP 04/09/2015   SpO2 96%   BMI 33.30 kg/m  Gen:  calm comfortable, NAD Resp: normal effort, no distress Abd: gravid  ROS, labs, PMH reviewed NST reactive   Plan: - D/C home - For LE dopplers @ 8am at [facility]- pt instructed - Given pre-e precautions - continue routine follow up in OB clinic  ADDENDUM: Per U/S report this AM, no DVT/SVT noted.  Cam Hai, CNM 9:52 AM  01/19/2016

## 2016-01-18 NOTE — MAU Note (Signed)
Pt reports she has been nauseated and having abd pain x 6 hours, also reports she had an episode of blurred vision earlier.

## 2016-01-18 NOTE — Discharge Instructions (Signed)
It was so nice to meet you!  All of your labs came back normal and your blood pressures looked good while you were in the MAU.  You do have some swelling of your left leg and we will need to make sure you do not have a blood clot. [facility] at [facility]8AM tomorrow morning to have an ultrasound of your leg done.

## 2016-01-19 ENCOUNTER — Ambulatory Visit (HOSPITAL_COMMUNITY)
Admission: RE | Admit: 2016-01-19 | Discharge: 2016-01-19 | Disposition: A | Discharge status: Home / Self Care | Payer: Medicaid Other | Source: Ambulatory Visit | Attending: Obstetrics and Gynecology | Admitting: Obstetrics and Gynecology

## 2016-01-19 DIAGNOSIS — R600 Localized edema: Secondary | ICD-10-CM | POA: Insufficient documentation

## 2016-01-19 NOTE — Progress Notes (Signed)
VASCULAR LAB PRELIMINARY  PRELIMINARY  PRELIMINARY  PRELIMINARY  Left lower extremity venous duplex completed.    Preliminary report:  There is no DVT or SVT noted in the left lower extremity.   [name], RVT 01/19/2016, 9:28 AM

## 2016-01-23 ENCOUNTER — Telehealth: Payer: Self-pay | Admitting: *Deleted

## 2016-01-23 ENCOUNTER — Encounter (HOSPITAL_COMMUNITY): Payer: Self-pay

## 2016-01-23 ENCOUNTER — Encounter: Payer: Self-pay | Admitting: Women's Health

## 2016-01-23 ENCOUNTER — Inpatient Hospital Stay (HOSPITAL_COMMUNITY)
Admission: AD | Admit: 2016-01-23 | Discharge: 2016-01-26 | DRG: 775 | Disposition: A | Discharge status: Home / Self Care | Payer: Medicaid Other | Source: Ambulatory Visit | Attending: Family Medicine | Admitting: Family Medicine

## 2016-01-23 ENCOUNTER — Inpatient Hospital Stay (HOSPITAL_COMMUNITY): Payer: Medicaid Other | Admitting: Anesthesiology

## 2016-01-23 ENCOUNTER — Ambulatory Visit (INDEPENDENT_AMBULATORY_CARE_PROVIDER_SITE_OTHER): Payer: Medicaid Other | Admitting: Women's Health

## 2016-01-23 VITALS — BP 154/98 | HR 76 | Wt 195.0 lb

## 2016-01-23 DIAGNOSIS — Z3483 Encounter for supervision of other normal pregnancy, third trimester: Secondary | ICD-10-CM

## 2016-01-23 DIAGNOSIS — O368130 Decreased fetal movements, third trimester, not applicable or unspecified: Secondary | ICD-10-CM | POA: Diagnosis not present

## 2016-01-23 DIAGNOSIS — O134 Gestational [pregnancy-induced] hypertension without significant proteinuria, complicating childbirth: Principal | ICD-10-CM | POA: Diagnosis present

## 2016-01-23 DIAGNOSIS — O0993 Supervision of high risk pregnancy, unspecified, third trimester: Secondary | ICD-10-CM | POA: Diagnosis not present

## 2016-01-23 DIAGNOSIS — Z6791 Unspecified blood type, Rh negative: Secondary | ICD-10-CM | POA: Diagnosis not present

## 2016-01-23 DIAGNOSIS — O09523 Supervision of elderly multigravida, third trimester: Secondary | ICD-10-CM

## 2016-01-23 DIAGNOSIS — Z3A38 38 weeks gestation of pregnancy: Secondary | ICD-10-CM | POA: Diagnosis not present

## 2016-01-23 DIAGNOSIS — Z331 Pregnant state, incidental: Secondary | ICD-10-CM | POA: Diagnosis not present

## 2016-01-23 DIAGNOSIS — O1493 Unspecified pre-eclampsia, third trimester: Secondary | ICD-10-CM

## 2016-01-23 DIAGNOSIS — Z8249 Family history of ischemic heart disease and other diseases of the circulatory system: Secondary | ICD-10-CM | POA: Diagnosis not present

## 2016-01-23 DIAGNOSIS — O133 Gestational [pregnancy-induced] hypertension without significant proteinuria, third trimester: Secondary | ICD-10-CM

## 2016-01-23 DIAGNOSIS — O26893 Other specified pregnancy related conditions, third trimester: Secondary | ICD-10-CM | POA: Diagnosis present

## 2016-01-23 DIAGNOSIS — Z87891 Personal history of nicotine dependence: Secondary | ICD-10-CM | POA: Diagnosis not present

## 2016-01-23 DIAGNOSIS — O322XX0 Maternal care for transverse and oblique lie, not applicable or unspecified: Secondary | ICD-10-CM | POA: Diagnosis present

## 2016-01-23 DIAGNOSIS — Z1389 Encounter for screening for other disorder: Secondary | ICD-10-CM

## 2016-01-23 DIAGNOSIS — Z3A39 39 weeks gestation of pregnancy: Secondary | ICD-10-CM | POA: Diagnosis not present

## 2016-01-23 DIAGNOSIS — O99824 Streptococcus B carrier state complicating childbirth: Secondary | ICD-10-CM | POA: Diagnosis present

## 2016-01-23 DIAGNOSIS — O1203 Gestational edema, third trimester: Secondary | ICD-10-CM | POA: Diagnosis not present

## 2016-01-23 DIAGNOSIS — O139 Gestational [pregnancy-induced] hypertension without significant proteinuria, unspecified trimester: Secondary | ICD-10-CM | POA: Diagnosis present

## 2016-01-23 DIAGNOSIS — O1494 Unspecified pre-eclampsia, complicating childbirth: Secondary | ICD-10-CM | POA: Diagnosis present

## 2016-01-23 LAB — POCT URINALYSIS DIPSTICK
GLUCOSE UA: NEGATIVE
KETONES UA: NEGATIVE
Leukocytes, UA: NEGATIVE
NITRITE UA: NEGATIVE
Protein, UA: NEGATIVE
RBC UA: NEGATIVE

## 2016-01-23 LAB — COMPREHENSIVE METABOLIC PANEL
ALT: 18 U/L (ref 14–54)
AST: 22 U/L (ref 15–41)
Albumin: 2.6 g/dL — ABNORMAL LOW (ref 3.5–5.0)
Alkaline Phosphatase: 106 U/L (ref 38–126)
Anion gap: 9 (ref 5–15)
BUN: 12 mg/dL (ref 6–20)
CHLORIDE: 106 mmol/L (ref 101–111)
CO2: 20 mmol/L — AB (ref 22–32)
CREATININE: 0.5 mg/dL (ref 0.44–1.00)
Calcium: 9.2 mg/dL (ref 8.9–10.3)
GFR calc non Af Amer: >60 mL/min (ref >60)
Glucose, Bld: 85 mg/dL (ref 65–99)
POTASSIUM: 4.2 mmol/L (ref 3.5–5.1)
SODIUM: 135 mmol/L (ref 135–145)
Total Bilirubin: 0.5 mg/dL (ref 0.3–1.2)
Total Protein: 6.6 g/dL (ref 6.5–8.1)

## 2016-01-23 LAB — CBC
HEMATOCRIT: 34.3 % — AB (ref 36.0–46.0)
HEMOGLOBIN: 11.3 g/dL — AB (ref 12.0–15.0)
MCH: 28 pg (ref 26.0–34.0)
MCHC: 32.9 g/dL (ref 30.0–36.0)
MCV: 85.1 fL (ref 78.0–100.0)
Platelets: 255 10*3/uL (ref 150–400)
RBC: 4.03 MIL/uL (ref 3.87–5.11)
RDW: 13.8 % (ref 11.5–15.5)
WBC: 10.6 10*3/uL — ABNORMAL HIGH (ref 4.0–10.5)

## 2016-01-23 LAB — PROTEIN / CREATININE RATIO, URINE
Creatinine, Urine: 44 mg/dL
Protein Creatinine Ratio: 0.18 mg/mg{Cre} — ABNORMAL HIGH (ref 0.00–0.15)
Total Protein, Urine: 8 mg/dL

## 2016-01-23 MED ORDER — PHENYLEPHRINE 40 MCG/ML (10ML) SYRINGE FOR IV PUSH (FOR BLOOD PRESSURE SUPPORT)
80.0000 ug | PREFILLED_SYRINGE | INTRAVENOUS | Status: DC | PRN
  Filled 2016-01-23: qty 5

## 2016-01-23 MED ORDER — LIDOCAINE HCL (PF) 1 % IJ SOLN
30.0000 mL | INTRAMUSCULAR | Status: DC | PRN
  Filled 2016-01-23: qty 30

## 2016-01-23 MED ORDER — OXYCODONE-ACETAMINOPHEN 5-325 MG PO TABS: 1.0000 | ORAL_TABLET | ORAL | Status: DC | PRN

## 2016-01-23 MED ORDER — ACETAMINOPHEN 325 MG PO TABS: 650.0000 mg | ORAL_TABLET | ORAL | Status: DC | PRN

## 2016-01-23 MED ORDER — OXYTOCIN BOLUS FROM INFUSION
500.0000 mL | Freq: Once | INTRAVENOUS | Status: AC
  Administered 2016-01-24: 500 mL via INTRAVENOUS

## 2016-01-23 MED ORDER — EPHEDRINE 5 MG/ML INJ
10.0000 mg | INTRAVENOUS | Status: DC | PRN
  Filled 2016-01-23: qty 4

## 2016-01-23 MED ORDER — LABETALOL HCL 5 MG/ML IV SOLN: 20.0000 mg | INTRAVENOUS | Status: DC | PRN

## 2016-01-23 MED ORDER — MAGNESIUM SULFATE BOLUS VIA INFUSION
4.0000 g | Freq: Once | INTRAVENOUS | Status: AC
  Administered 2016-01-23: 4 g via INTRAVENOUS
  Filled 2016-01-23: qty 500

## 2016-01-23 MED ORDER — PENICILLIN G POTASSIUM 5000000 UNITS IJ SOLR
5.0000 10*6.[IU] | Freq: Once | INTRAVENOUS | Status: AC
  Administered 2016-01-23: 5 10*6.[IU] via INTRAVENOUS
  Filled 2016-01-23: qty 5

## 2016-01-23 MED ORDER — HYDRALAZINE HCL 20 MG/ML IJ SOLN: 10.0000 mg | Freq: Once | INTRAMUSCULAR | Status: DC | PRN

## 2016-01-23 MED ORDER — OXYTOCIN 40 UNITS IN LACTATED RINGERS INFUSION - SIMPLE MED
2.5000 [IU]/h | INTRAVENOUS | Status: DC
  Administered 2016-01-24: 2.5 [IU]/h via INTRAVENOUS
  Filled 2016-01-23: qty 1000

## 2016-01-23 MED ORDER — ONDANSETRON HCL 4 MG/2ML IJ SOLN: 4.0000 mg | Freq: Four times a day (QID) | INTRAMUSCULAR | Status: DC | PRN

## 2016-01-23 MED ORDER — LACTATED RINGERS IV SOLN: 500.0000 mL | INTRAVENOUS | Status: DC | PRN

## 2016-01-23 MED ORDER — SOD CITRATE-CITRIC ACID 500-334 MG/5ML PO SOLN: 30.0000 mL | ORAL | Status: DC | PRN

## 2016-01-23 MED ORDER — MAGNESIUM SULFATE 50 % IJ SOLN
2.0000 g/h | INTRAVENOUS | Status: DC
  Administered 2016-01-24: 2 g/h via INTRAVENOUS
  Filled 2016-01-23 (×2): qty 80

## 2016-01-23 MED ORDER — LACTATED RINGERS IV SOLN
INTRAVENOUS | Status: DC
  Administered 2016-01-23 – 2016-01-24 (×2): via INTRAVENOUS

## 2016-01-23 MED ORDER — ACETAMINOPHEN 160 MG/5ML PO SOLN
650.0000 mg | ORAL | Status: DC | PRN
  Administered 2016-01-23 – 2016-01-24 (×4): 650 mg via ORAL
  Filled 2016-01-23 (×5): qty 20.3

## 2016-01-23 MED ORDER — FENTANYL 2.5 MCG/ML BUPIVACAINE 1/10 % EPIDURAL INFUSION (WH - ANES)
14.0000 mL/h | INTRAMUSCULAR | Status: DC | PRN
  Administered 2016-01-23 – 2016-01-24 (×3): 14 mL/h via EPIDURAL
  Filled 2016-01-23 (×2): qty 125

## 2016-01-23 MED ORDER — PHENYLEPHRINE 40 MCG/ML (10ML) SYRINGE FOR IV PUSH (FOR BLOOD PRESSURE SUPPORT)
80.0000 ug | PREFILLED_SYRINGE | INTRAVENOUS | Status: DC | PRN
  Filled 2016-01-23: qty 10
  Filled 2016-01-23: qty 5

## 2016-01-23 MED ORDER — OXYTOCIN 40 UNITS IN LACTATED RINGERS INFUSION - SIMPLE MED
1.0000 m[IU]/min | INTRAVENOUS | Status: DC
  Administered 2016-01-23: 2 m[IU]/min via INTRAVENOUS

## 2016-01-23 MED ORDER — DIPHENHYDRAMINE HCL 50 MG/ML IJ SOLN
12.5000 mg | INTRAMUSCULAR | Status: AC | PRN
  Administered 2016-01-23 – 2016-01-24 (×3): 12.5 mg via INTRAVENOUS
  Filled 2016-01-23 (×3): qty 1

## 2016-01-23 MED ORDER — OXYCODONE-ACETAMINOPHEN 5-325 MG PO TABS: 2.0000 | ORAL_TABLET | ORAL | Status: DC | PRN

## 2016-01-23 MED ORDER — LIDOCAINE HCL (PF) 1 % IJ SOLN
INTRAMUSCULAR | Status: DC | PRN
  Administered 2016-01-23 (×2): 7 mL via EPIDURAL

## 2016-01-23 MED ORDER — TERBUTALINE SULFATE 1 MG/ML IJ SOLN: 0.2500 mg | Freq: Once | INTRAMUSCULAR | Status: DC | PRN

## 2016-01-23 MED ORDER — PENICILLIN G POTASSIUM 5000000 UNITS IJ SOLR
2.5000 10*6.[IU] | INTRAVENOUS | Status: DC
  Administered 2016-01-24 (×2): 2.5 10*6.[IU] via INTRAVENOUS
  Filled 2016-01-23 (×7): qty 2.5

## 2016-01-23 MED ORDER — LACTATED RINGERS IV SOLN
500.0000 mL | Freq: Once | INTRAVENOUS | Status: AC
  Administered 2016-01-23: 500 mL via INTRAVENOUS

## 2016-01-23 NOTE — Progress Notes (Signed)
Work-in Low-risk OB appointment [id number] 6683w6d Estimated Date of Delivery: 01/31/16 BP (!) 154/98   Pulse 76   Wt 195 lb (88.5 kg)   LMP 04/09/2015   BMI 33.47 kg/m   BP, weight, and urine reviewed.  Refer to obstetrical flow sheet for FH & FHR.  Reports decreased fm.  Denies vb or uti s/s. Has been having lots of pelvic pain/pressure, +uc's, leaking fluid since yesterday, +headaches, dizziness, and seeing spots. Denies ruq/epigastric pain, n/v. [facility] b/c she like she may be developing pre-e, bp's and labs were normal. She did have a venous doppler of Lt LE d/t increased swelling +homan's which was neg for DVT.  FOB is out of town working- wants to try to get back in time for birth Has been unstable lie, was transverse at 36wks, then oblique at 37wks  FHR 145, reactive NST/Cat I, uc's q 5-7810mins SSE: cx visually closed, no pooling, no change w/ valsalva, fern & nitrazine neg SVE: 3/50/-2, vtx DTRs 2+, no clonus, trace BLE edema  Has had 2 elevated bp's earlier in pregnancy at 29 and 34wks, does have h/o GHTN prev preg Discussed w/ LHE, dx GHTN today- [facility] to [facility] for [facility]/augmentation of possible early labor Plan:  To [facility], called and notified Wynelle BourgeoisMarie Williams, CNM and L&D charge RN F/U in 1wk for pp bp check

## 2016-01-23 NOTE — H&P (Signed)
LABOR AND DELIVERY ADMISSION HISTORY AND PHYSICAL NOTE  [name] is a 35 y.o. female [phone] with IUP at 5674w6d by 6 wk US presenting for IOL for preeclampsia with severe features.   Patient was seen at her OB office this AM, found to have elevated BPs, headache and scitomas, and therefore was sent in for IOL for preeclampsia with severe features. She admits to having contractions for the past day or two. Currently not having scitomas, and headache resolved after given Tylenol after her admission (still had headache on admission).  She reports positive fetal movement. She denies leakage of fluid or vaginal bleeding.  Prenatal History/Complications:  GHTN; Rh neg; h/o gestational diabetes  Past Medical History: Past Medical History:  Diagnosis Date  . Anemia   . Anxiety "fast hearbeat"   took Benadryl at end of pregnancy  . Back pain affecting pregnancy in first trimester 05/27/2015  . Complication of anesthesia    Epidural 1 sided  . Depression    During 1 pregnancy  . Diabetes mellitus without complication (HCC)   . Gestational diabetes    just started checking BS 03/05/12  . Headache(784.0)   . Hematuria 05/27/2015  . Irregular periods   . Nausea 01/27/2015  . Pregnancy induced hypertension   . Pregnant 01/27/2015  . Recurrent upper respiratory infection (URI)    Mostly when pregnant  . Spotting affecting pregnancy in first trimester 01/27/2015    Past Surgical History: Past Surgical History:  Procedure Laterality Date  . MULTIPLE TOOTH EXTRACTIONS    . WISDOM TOOTH EXTRACTION      Obstetrical History: OB History    Gravida Para Term Preterm AB Living   8 5 5  0 2 5   SAB TAB Ectopic Multiple Live Births   2       5      Social History: Social History   Social History  . Marital status: Divorced    Spouse name: N/A  . Number of children: N/A  . Years of education: N/A   Social History Main Topics  . Smoking status: Former Smoker    Packs/day: 0.10     Years: 0.50    Types: Cigarettes    Start date: 02/19/1998    Quit date: 09/17/1998  . Smokeless tobacco: Never Used  . Alcohol use No     Comment: not while pregnant  . Drug use: No  . Sexual activity: Yes    Birth control/ protection: None   Other Topics Concern  . None   Social History Narrative  . None    Family History: Family History  Problem Relation Age of Onset  . Heart disease Maternal Grandmother   . Cancer Maternal Grandmother     brain  . Heart failure Maternal Grandmother   . Cancer Paternal Grandmother     brain  . Hypertension Mother   . Anxiety disorder Mother   . Depression Mother   . Hypertension Father   . Anxiety disorder Sister   . ADD / ADHD Daughter   . Asthma Daughter   . ADD / ADHD Daughter   . Seizures Daughter   . ADD / ADHD Daughter   . ADD / ADHD Daughter     Allergies: No Known Allergies  Prescriptions Prior to Admission  Medication Sig Dispense Refill Last Dose  . acetaminophen (TYLENOL) 160 MG/5ML suspension Take 960 mg by mouth every 6 (six) hours as needed for mild pain, moderate pain or headache.    Past  Month at Unknown time  . aspirin 81 MG chewable tablet Chew 81 mg by mouth at bedtime.   01/22/2016 at 2200  . citalopram (CELEXA) 10 MG tablet Take 10 mg by mouth at bedtime.    Past Week at Unknown time  . fluticasone (FLONASE) 50 MCG/ACT nasal spray Place 1 spray into both nostrils daily as needed for rhinitis.    Past Week at Unknown time  . loratadine (CLARITIN) 10 MG tablet Take 10 mg by mouth at bedtime as needed for allergies.    Past Week at Unknown time  . prenatal vitamin w/FE, FA (NATACHEW) 29-1 MG CHEW chewable tablet Chew 2 tablets by mouth at bedtime.   01/22/2016 at Unknown time     Review of Systems   All systems reviewed and negative except as stated in HPI  Blood pressure (!) 133/95, pulse (!) 101, temperature 98 F (36.7 C), temperature source Oral, resp. rate 20, height 5\' 4"  (1.626 m), weight 195  lb (88.5 kg), last menstrual period 04/09/2015, SpO2 100 %. General appearance: alert, cooperative, appears stated age and no distress Lungs: clear to auscultation bilaterally Heart: regular rate and rhythm Abdomen: soft, non-tender; bowel sounds normal Extremities: No calf swelling or tenderness Presentation: cephalic Fetal monitoring: 150, mod var, +accels, no decels Uterine activity: q36min Dilation: 4 Effacement (%): 60 Station: -2 Exam by:: Raliegh Ip RN   Prenatal labs: ABO, Rh: --/--/A NEG (10/16 2003) Antibody: POS (10/16 2003) Rubella: !Error! RPR: Non Reactive (07/25 0840)  HBsAg: Negative (03/14 1152)  HIV: Non Reactive (07/25 0840)  GBS: Positive (10/02 1545)  2 hr Glucola: 77/145/127 (normal) Genetic screening:  Normal Anatomy US: Normal, female  Prenatal Transfer Tool  Maternal Diabetes: No Genetic Screening: Normal Maternal Ultrasounds/Referrals: Normal Fetal Ultrasounds or other Referrals:  None Maternal Substance Abuse:  No Significant Maternal Medications:  None Significant Maternal Lab Results: Lab values include: Group B Strep positive, Rh negative  Results for orders placed or performed during the hospital encounter of 01/23/16 (from the past 24 hour(s))  Protein / creatinine ratio, urine   Collection Time: 01/23/16  7:30 PM  Result Value Ref Range   Creatinine, Urine 44.00 mg/dL   Total Protein, Urine 8 mg/dL   Protein Creatinine Ratio 0.18 (H) 0.00 - 0.15 mg/mg[Cre]  CBC   Collection Time: 01/23/16  8:03 PM  Result Value Ref Range   WBC 10.6 (H) 4.0 - 10.5 K/uL   RBC 4.03 3.87 - 5.11 MIL/uL   Hemoglobin 11.3 (L) 12.0 - 15.0 g/dL   HCT 96.0 (L) 45.4 - 09.8 %   MCV 85.1 78.0 - 100.0 fL   MCH 28.0 26.0 - 34.0 pg   MCHC 32.9 30.0 - 36.0 g/dL   RDW 11.9 14.7 - 82.9 %   Platelets 255 150 - 400 K/uL  Comprehensive metabolic panel   Collection Time: 01/23/16  8:03 PM  Result Value Ref Range   Sodium 135 135 - 145 mmol/L   Potassium 4.2 3.5 -  5.1 mmol/L   Chloride 106 101 - 111 mmol/L   CO2 20 (L) 22 - 32 mmol/L   Glucose, Bld 85 65 - 99 mg/dL   BUN 12 6 - 20 mg/dL   Creatinine, Ser 5.62 0.44 - 1.00 mg/dL   Calcium 9.2 8.9 - 13.0 mg/dL   Total Protein 6.6 6.5 - 8.1 g/dL   Albumin 2.6 (L) 3.5 - 5.0 g/dL   AST 22 15 - 41 U/L   ALT 18 14 - 54  U/L   Alkaline Phosphatase 106 38 - 126 U/L   Total Bilirubin 0.5 0.3 - 1.2 mg/dL   GFR calc non Af Amer >60 >60 mL/min   GFR calc Af Amer >60 >60 mL/min   Anion gap 9 5 - 15  Type and screen [facility]   Collection Time: 01/23/16  8:03 PM  Result Value Ref Range   ABO/RH(D) A NEG    Antibody Screen POS    Sample Expiration 01/26/2016    Antibody Identification PASSIVELY ACQUIRED ANTI-D    DAT, 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    Unit Number [id number]    Blood Component Type RBC LR PHER1    Unit division 00    Status of Unit ALLOCATED    Transfusion Status OK TO TRANSFUSE    Crossmatch Result COMPATIBLE    Unit Number [id number]    Blood Component Type RBC LR PHER1    Unit division 00    Status of Unit ALLOCATED    Transfusion Status OK TO TRANSFUSE    Crossmatch Result COMPATIBLE   Results for orders placed or performed in visit on 01/23/16 (from the past 24 hour(s))  POCT urinalysis dipstick   Collection Time: 01/23/16  3:51 PM  Result Value Ref Range   Color, UA     Clarity, UA     Glucose, UA neg    Bilirubin, UA     Ketones, UA neg    Spec Grav, UA     Blood, UA neg    pH, UA     Protein, UA neg    Urobilinogen, UA     Nitrite, UA neg    Leukocytes, UA Negative Negative    Patient Active Problem List   Diagnosis Date Noted  . Gestational hypertension 01/23/2016  . Transverse fetal lie 01/05/2016  . Chest pain w/ Right bundle branch block 08/08/2015  . Poor dentition 07/19/2015  . Supervision of normal pregnancy 06/21/2015  . History of gestational diabetes in prior pregnancy, currently pregnant 06/21/2015  . History of gestational hypertension  06/21/2015  . AMA (advanced maternal age) multigravida 35+ 06/21/2015  . Grand multipara 06/21/2015  . Rh negative state in antepartum period 06/21/2015  . Hematuria 05/27/2015  . Anxiety 03/05/2012    Assessment: [name] is a 35 y.o. [id number] at [redacted]w[redacted]d here for preeclampsia with severe features (headaches and scitomas).  #Labor: IOL with pitocin the AROM after second PCN started #Pain: Epidural on request, IV pain meds prn #FWB: Cat I #ID:  GBS Pos - PCN #MOF: Breast #MOC:Depo #Circ:  Yes, outpatient  Jen Mow, DO 01/23/2016, 11:23 PM

## 2016-01-23 NOTE — Telephone Encounter (Signed)
Pt c/o watery discharge, lower back and adominal pain. Pt given an appt for evaluation today.

## 2016-01-23 NOTE — Anesthesia Procedure Notes (Signed)
Epidural Patient location during procedure: OB Start time: 01/23/2016 10:25 PM End time: 01/23/2016 10:29 PM  Staffing Anesthesiologist: Leilani AbleHATCHETT, Albion Weatherholtz Performed: anesthesiologist   Preanesthetic Checklist Completed: patient identified, surgical consent, pre-op evaluation, timeout performed, IV checked, risks and benefits discussed and monitors and equipment checked  Epidural Patient position: sitting Prep: site prepped and draped and DuraPrep Patient monitoring: continuous pulse ox and blood pressure Approach: midline Location: L3-L4 Injection technique: LOR air  Needle:  Needle type: Tuohy  Needle gauge: 17 G Needle length: 9 cm and 9 Needle insertion depth: 4 cm Catheter type: closed end flexible Catheter size: 19 Gauge Catheter at skin depth: 9 cm Test dose: negative and Other  Assessment Sensory level: T9 Events: blood not aspirated, injection not painful, no injection resistance, negative IV test and no paresthesia  Additional Notes Reason for block:procedure for pain

## 2016-01-23 NOTE — Anesthesia Preprocedure Evaluation (Signed)
Anesthesia Evaluation  Patient identified by MRN, date of birth, ID band Patient awake    Reviewed: Allergy & Precautions, H&P , Patient's Chart, lab work & pertinent test results  Airway Mallampati: II  TM Distance: >3 FB Neck ROM: full    Dental no notable dental hx.    Pulmonary neg pulmonary ROS, former smoker,    Pulmonary exam normal        Cardiovascular hypertension, negative cardio ROS Normal cardiovascular exam     Neuro/Psych    GI/Hepatic negative GI ROS, Neg liver ROS,   Endo/Other  negative endocrine ROSdiabetes  Renal/GU negative Renal ROS     Musculoskeletal   Abdominal (+) + obese,   Peds  Hematology   Anesthesia Other Findings   Reproductive/Obstetrics (+) Pregnancy                             Anesthesia Physical Anesthesia Plan  ASA: II  Anesthesia Plan: Epidural   Post-op Pain Management:    Induction:   Airway Management Planned:   Additional Equipment:   Intra-op Plan:   Post-operative Plan:   Informed Consent: I have reviewed the patients History and Physical, chart, labs and discussed the procedure including the risks, benefits and alternatives for the proposed anesthesia with the patient or authorized representative who has indicated his/her understanding and acceptance.     Plan Discussed with:   Anesthesia Plan Comments:         Anesthesia Quick Evaluation

## 2016-01-23 NOTE — Anesthesia Pain Management Evaluation Note (Signed)
  CRNA Pain Management Visit Note  Patient: [name], 35 y.o., female  "Hello I am a [facility] anesthesia [facility]. We have an anesthesia team available at all times to provide care throughout the hospital, including epidural management and anesthesia for C-section. I don't know your plan for the delivery whether it a natural birth, water birth, IV sedation, nitrous supplementation, doula or epidural, but we want to meet your pain goals."   1.Was your pain managed to your expectations on prior hospitalizations?   No   2.What is your expectation for pain management during this hospitalization?     Epidural  3.How can we help you reach that goal? Epidural when desired. Instructed patient to tell anesthesiologist about one sided block history and one failed block. Also asked her to ask RN to call anesthesiologist for one sided block or if epidural not effective.  Record the patient's initial score and the patient's pain goal.   Pain: 4  Pain Goal: 6 [facility] wants you to be able to say your pain was always managed very well.  [name] 01/23/2016

## 2016-01-23 NOTE — MAU Note (Signed)
Here for induction.

## 2016-01-24 ENCOUNTER — Encounter (HOSPITAL_COMMUNITY): Payer: Self-pay

## 2016-01-24 ENCOUNTER — Encounter: Payer: Medicaid Other | Admitting: Women's Health

## 2016-01-24 DIAGNOSIS — O99824 Streptococcus B carrier state complicating childbirth: Secondary | ICD-10-CM

## 2016-01-24 DIAGNOSIS — Z6791 Unspecified blood type, Rh negative: Secondary | ICD-10-CM

## 2016-01-24 DIAGNOSIS — O134 Gestational [pregnancy-induced] hypertension without significant proteinuria, complicating childbirth: Secondary | ICD-10-CM

## 2016-01-24 DIAGNOSIS — Z87891 Personal history of nicotine dependence: Secondary | ICD-10-CM

## 2016-01-24 DIAGNOSIS — Z3A38 38 weeks gestation of pregnancy: Secondary | ICD-10-CM

## 2016-01-24 DIAGNOSIS — O26893 Other specified pregnancy related conditions, third trimester: Secondary | ICD-10-CM

## 2016-01-24 DIAGNOSIS — O139 Gestational [pregnancy-induced] hypertension without significant proteinuria, unspecified trimester: Secondary | ICD-10-CM

## 2016-01-24 DIAGNOSIS — O322XX0 Maternal care for transverse and oblique lie, not applicable or unspecified: Secondary | ICD-10-CM

## 2016-01-24 LAB — RPR: RPR Ser Ql: NONREACTIVE

## 2016-01-24 LAB — CBC
HEMATOCRIT: 32.5 % — AB (ref 36.0–46.0)
HEMOGLOBIN: 10.8 g/dL — AB (ref 12.0–15.0)
MCH: 28.6 pg (ref 26.0–34.0)
MCHC: 33.2 g/dL (ref 30.0–36.0)
MCV: 86 fL (ref 78.0–100.0)
Platelets: 241 10*3/uL (ref 150–400)
RBC: 3.78 MIL/uL — AB (ref 3.87–5.11)
RDW: 14 % (ref 11.5–15.5)
WBC: 12.1 10*3/uL — AB (ref 4.0–10.5)

## 2016-01-24 MED ORDER — PRENATAL MULTIVITAMIN CH: 1.0000 | ORAL_TABLET | Freq: Every day | ORAL | Status: DC

## 2016-01-24 MED ORDER — ZOLPIDEM TARTRATE 5 MG PO TABS: 5.0000 mg | ORAL_TABLET | Freq: Every evening | ORAL | Status: DC | PRN

## 2016-01-24 MED ORDER — DIBUCAINE 1 % RE OINT: 1.0000 "application " | TOPICAL_OINTMENT | RECTAL | Status: DC | PRN

## 2016-01-24 MED ORDER — SIMETHICONE 80 MG PO CHEW: 80.0000 mg | CHEWABLE_TABLET | ORAL | Status: DC | PRN

## 2016-01-24 MED ORDER — ONDANSETRON HCL 4 MG PO TABS: 4.0000 mg | ORAL_TABLET | ORAL | Status: DC | PRN

## 2016-01-24 MED ORDER — ONDANSETRON HCL 4 MG/2ML IJ SOLN
4.0000 mg | INTRAMUSCULAR | Status: DC | PRN
Start: 2016-01-24 — End: 2016-01-24

## 2016-01-24 MED ORDER — SENNOSIDES-DOCUSATE SODIUM 8.6-50 MG PO TABS: 2.0000 | ORAL_TABLET | ORAL | Status: DC

## 2016-01-24 MED ORDER — DIPHENHYDRAMINE HCL 25 MG PO CAPS: 25.0000 mg | ORAL_CAPSULE | Freq: Four times a day (QID) | ORAL | Status: DC | PRN

## 2016-01-24 MED ORDER — ACETAMINOPHEN 325 MG PO TABS
650.0000 mg | ORAL_TABLET | ORAL | Status: DC | PRN
  Administered 2016-01-25: 650 mg via ORAL
  Filled 2016-01-24 (×2): qty 2

## 2016-01-24 MED ORDER — IBUPROFEN 600 MG PO TABS
600.0000 mg | ORAL_TABLET | Freq: Four times a day (QID) | ORAL | Status: DC
  Filled 2016-01-24: qty 1

## 2016-01-24 MED ORDER — COCONUT OIL OIL: 1.0000 "application " | TOPICAL_OIL | Status: DC | PRN

## 2016-01-24 MED ORDER — FLUTICASONE PROPIONATE 50 MCG/ACT NA SUSP
1.0000 | Freq: Every day | NASAL | Status: DC | PRN
  Administered 2016-01-25: 1 via NASAL
  Filled 2016-01-24: qty 16

## 2016-01-24 MED ORDER — CITALOPRAM HYDROBROMIDE 10 MG PO TABS
10.0000 mg | ORAL_TABLET | Freq: Every day | ORAL | Status: DC
  Administered 2016-01-25 (×2): 10 mg via ORAL
  Filled 2016-01-24 (×2): qty 1

## 2016-01-24 MED ORDER — BENZOCAINE-MENTHOL 20-0.5 % EX AERO: 1.0000 "application " | INHALATION_SPRAY | CUTANEOUS | Status: DC | PRN

## 2016-01-24 MED ORDER — LORATADINE 10 MG PO TABS
10.0000 mg | ORAL_TABLET | Freq: Every evening | ORAL | Status: DC | PRN
  Filled 2016-01-24: qty 1

## 2016-01-24 MED ORDER — TETANUS-DIPHTH-ACELL PERTUSSIS 5-2.5-18.5 LF-MCG/0.5 IM SUSP: 0.5000 mL | Freq: Once | INTRAMUSCULAR | Status: DC

## 2016-01-24 MED ORDER — COMPLETENATE 29-1 MG PO CHEW
1.0000 | CHEWABLE_TABLET | Freq: Every day | ORAL | Status: DC
  Administered 2016-01-24 – 2016-01-25 (×2): 1 via ORAL
  Filled 2016-01-24 (×3): qty 1

## 2016-01-24 MED ORDER — WITCH HAZEL-GLYCERIN EX PADS: 1.0000 "application " | MEDICATED_PAD | CUTANEOUS | Status: DC | PRN

## 2016-01-24 MED ORDER — OXYCODONE-ACETAMINOPHEN 5-325 MG PO TABS: 1.0000 | ORAL_TABLET | ORAL | Status: DC | PRN

## 2016-01-24 MED ORDER — LABETALOL HCL 5 MG/ML IV SOLN: 20.0000 mg | INTRAVENOUS | Status: DC | PRN

## 2016-01-24 MED ORDER — HYDRALAZINE HCL 20 MG/ML IJ SOLN: 10.0000 mg | Freq: Once | INTRAMUSCULAR | Status: DC | PRN

## 2016-01-24 MED ORDER — ONDANSETRON HCL 4 MG/2ML IJ SOLN: 4.0000 mg | INTRAMUSCULAR | Status: DC | PRN

## 2016-01-24 MED ORDER — MAGNESIUM SULFATE 50 % IJ SOLN
2.0000 g/h | INTRAVENOUS | Status: AC
  Filled 2016-01-24: qty 80

## 2016-01-24 MED ORDER — ACETAMINOPHEN 325 MG PO TABS: 650.0000 mg | ORAL_TABLET | ORAL | Status: DC | PRN

## 2016-01-24 MED ORDER — LACTATED RINGERS IV SOLN
INTRAVENOUS | Status: AC
  Administered 2016-01-24: 16:00:00 via INTRAVENOUS

## 2016-01-24 NOTE — Anesthesia Postprocedure Evaluation (Signed)
Anesthesia Post Note  Patient: [name]  Procedure(s) Performed: * No procedures listed *  Patient location during evaluation: [facility] Anesthesia Type: Epidural Level of consciousness: awake and alert Pain management: pain level controlled Vital Signs Assessment: post-procedure vital signs reviewed and stable Respiratory status: spontaneous breathing, nonlabored ventilation and respiratory function stable Cardiovascular status: stable Postop Assessment: no headache, no backache and epidural receding Anesthetic complications: no     Last Vitals:  Vitals:   01/24/16 1200 01/24/16 1300  BP: 117/81 115/74  Pulse: 93 94  Resp: 18 18  Temp:  36.9 C    Last Pain:  Vitals:   01/24/16 1330  TempSrc:   PainSc: 5    Pain Goal: Patients Stated Pain Goal: 2 (01/24/16 1330)               Junious SilkGILBERT,Karalyne Nusser

## 2016-01-24 NOTE — Lactation Note (Signed)
This note was copied from a baby's chart. Lactation Consultation Note  Patient Name: [name] WUJWJ'XToday's Date: 01/24/2016 Reason for consult: Initial assessment  Baby 5 hours old. Mom reports baby sleepy at breast. Assisted with positioning and latching baby. Baby latched to right breast in cross-cradle position. Enc mom to support baby's head and express colostrum prior to latching. Baby latched deeply, suckled rhythmically with a few swallows noted. Mom able to easily express colostrum. Mom aware of OP/BFSG and LC phone line assistance after D/C.  Maternal Data Has patient been taught Hand Expression?: Yes Does the patient have breastfeeding experience prior to this delivery?: Yes  Feeding Feeding Type: Breast Fed Length of feed:  (LC assessed first 10 minutes of BF.)  LATCH Score/Interventions Latch: Repeated attempts needed to sustain latch, nipple held in mouth throughout feeding, stimulation needed to elicit sucking reflex. Intervention(s): Adjust position;Assist with latch;Breast compression  Audible Swallowing: A few with stimulation Intervention(s): Skin to skin;Hand expression  Type of Nipple: Everted at rest and after stimulation  Comfort (Breast/Nipple): Soft / non-tender     Hold (Positioning): Assistance needed to correctly position infant at breast and maintain latch. Intervention(s): Breastfeeding basics reviewed;Support Pillows;Position options;Skin to skin  LATCH Score: 7  Lactation Tools Discussed/Used     Consult Status Consult Status: Follow-up Date: 01/25/16 Follow-up type: In-patient    Sandra HayJennifer D [name] 01/24/2016, 2:46 PM

## 2016-01-25 LAB — CBC
HEMATOCRIT: 29.9 % — AB (ref 36.0–46.0)
Hemoglobin: 9.6 g/dL — ABNORMAL LOW (ref 12.0–15.0)
MCH: 27.5 pg (ref 26.0–34.0)
MCHC: 32.1 g/dL (ref 30.0–36.0)
MCV: 85.7 fL (ref 78.0–100.0)
Platelets: 224 10*3/uL (ref 150–400)
RBC: 3.49 MIL/uL — ABNORMAL LOW (ref 3.87–5.11)
RDW: 14.2 % (ref 11.5–15.5)
WBC: 9.5 10*3/uL (ref 4.0–10.5)

## 2016-01-25 LAB — COMPREHENSIVE METABOLIC PANEL
ALBUMIN: 2.2 g/dL — AB (ref 3.5–5.0)
ALT: 18 U/L (ref 14–54)
AST: 23 U/L (ref 15–41)
Alkaline Phosphatase: 76 U/L (ref 38–126)
Anion gap: 4 — ABNORMAL LOW (ref 5–15)
BILIRUBIN TOTAL: 0.3 mg/dL (ref 0.3–1.2)
BUN: 6 mg/dL (ref 6–20)
CHLORIDE: 108 mmol/L (ref 101–111)
CO2: 25 mmol/L (ref 22–32)
CREATININE: 0.48 mg/dL (ref 0.44–1.00)
Calcium: 7 mg/dL — ABNORMAL LOW (ref 8.9–10.3)
GFR calc Af Amer: >60 mL/min (ref >60)
GFR calc non Af Amer: >60 mL/min (ref >60)
GLUCOSE: 94 mg/dL (ref 65–99)
POTASSIUM: 4.1 mmol/L (ref 3.5–5.1)
Sodium: 137 mmol/L (ref 135–145)
Total Protein: 5.8 g/dL — ABNORMAL LOW (ref 6.5–8.1)

## 2016-01-25 MED ORDER — OXYCODONE HCL 5 MG/5ML PO SOLN
5.0000 mg | ORAL | Status: DC | PRN
  Administered 2016-01-25 – 2016-01-26 (×2): 5 mg via ORAL
  Filled 2016-01-25 (×2): qty 5

## 2016-01-25 MED ORDER — ACETAMINOPHEN 160 MG/5ML PO SOLN
325.0000 mg | ORAL | Status: DC | PRN
  Administered 2016-01-25 – 2016-01-26 (×3): 325 mg via ORAL
  Filled 2016-01-25 (×3): qty 20.3

## 2016-01-25 MED ORDER — RHO D IMMUNE GLOBULIN 1500 UNIT/2ML IJ SOSY
300.0000 ug | PREFILLED_SYRINGE | Freq: Once | INTRAMUSCULAR | Status: AC
  Administered 2016-01-25: 300 ug via INTRAMUSCULAR
  Filled 2016-01-25: qty 2

## 2016-01-25 MED ORDER — OXYCODONE HCL 5 MG PO TABS: 5.0000 mg | ORAL_TABLET | ORAL | Status: DC | PRN

## 2016-01-25 MED ORDER — COCONUT OIL OIL
1.0000 "application " | TOPICAL_OIL | Status: DC | PRN
  Administered 2016-01-25: 1 via TOPICAL
  Filled 2016-01-25: qty 120

## 2016-01-25 NOTE — Lactation Note (Signed)
This note was copied from a baby's chart. Lactation Consultation Note  Patient Name: [name] ZOXWR'UToday's Date: 01/25/2016 Reason for consult: Follow-up assessment   Follow up with Exp BF mom of 29 hour old infant. Infant with 8 BF for 10-25 minutes, 2 attempts, 7 voids and 1 stool in 24 hours preceding this assessment. Infant weight 6 lb 13.5 oz with 7% weight loss since birth. LATCH Scores 9 by bedside RN. Mom is concerned infant is sleepy and not eating well.   Mom reports infant fed for 10 minutes and now is asleep. Mom tool off clothing and was attempting to awaken infant. Assisted mom with awakening techniques, infant fed for an additional 5 minutes and then fell asleep. Mom with compressible breasts and everted nipples. Showed mom how to hand express and she returned demonstration, Colostrum flowing easily, Hand expressed 2 cc and showed mom how to spoon feed infant. Infant tolerated spoon feeding well. Enc mom to spoon feed infant as needed prior to feeding to stimulate feeding or after feeding to give her more volume. Mom was given a manual pump with instructions for assembling, disassembling, cleaning and pumping.   Enc mom to feed STS and to awaken during feeding as needed to try and elongate feeding times. Enc mom to feed 8-12 x in 24 hours at first feeding cues. Enc mom to massage/compress breast with feeding.   Reviewed all BF information in Taking Care of Baby and Me Booklet, Reviewed positioning, BF Basics, I/O and feeding log, Engorgement prevention/treatment, BM storage and thawing. [facility] given, mom informed of OP Services, BF Support Groups and LC phone #. Enc mom to call with questions/concerns prn. [facility] and is aware to call and make an appointment. Infant with f/u ped appt Friday.    Maternal Data Formula Feeding for Exclusion: No Has patient been taught Hand Expression?: Yes Does the patient have breastfeeding experience prior to  this delivery?: Yes  Feeding Feeding Type: Breast Fed Length of feed: 15 min  LATCH Score/Interventions Latch: Repeated attempts needed to sustain latch, nipple held in mouth throughout feeding, stimulation needed to elicit sucking reflex. Intervention(s): Adjust position;Assist with latch;Breast massage;Breast compression  Audible Swallowing: A few with stimulation  Type of Nipple: Everted at rest and after stimulation  Comfort (Breast/Nipple): Soft / non-tender     Hold (Positioning): Assistance needed to correctly position infant at breast and maintain latch. Intervention(s): Breastfeeding basics reviewed;Support Pillows;Position options;Skin to skin  LATCH Score: 7  Lactation Tools Discussed/Used WIC Program: Yes [facility] Pump Review: Setup, frequency, and cleaning;Milk Storage   Consult Status Consult Status: Complete Follow-up type: Call as needed    Ed BlalockSharon S Hice 01/25/2016, 3:32 PM

## 2016-01-25 NOTE — Progress Notes (Signed)
POSTPARTUM PROGRESS NOTE  Post Partum Day 1 Subjective:  [name] is a 35 y.o. [id number] 8637w0d s/p nsvd.  No acute events overnight.  Pt denies problems with ambulating, voiding or po intake.  She denies nausea or vomiting.  Pain is well controlled.  She has had flatus. She has not had bowel movement.  Lochia Small. Blurry vision about the same as before. No ha, no upper abdominal pain, no sob  Objective: Blood pressure 114/75, pulse 90, temperature 98.2 F (36.8 C), temperature source Oral, resp. rate 16, height 5\' 4"  (1.626 m), weight 185 lb 12 oz (84.3 kg), last menstrual period 04/09/2015, SpO2 99 %, unknown if currently breastfeeding.  Physical Exam:  General: alert, cooperative and no distress Lochia:normal flow Chest: CTAB Heart: RRR no m/r/g Abdomen: +BS, soft, nontender,  Uterine Fundus: firm,  DVT Evaluation: No calf swelling or tenderness Extremities: trace edema   Recent Labs  01/24/16 0929 01/25/16 0522  HGB 10.8* 9.6*  HCT 32.5* 29.9*    Assessment/Plan:  ASSESSMENT: [name] is a 35 y.o. [id number] 2437w0d s/p nsvd, c/b severe preE. Vision change continues, no other features of severe disease, is s/p 24 hrs pp mg, and bps all well wnl not on antihypertensives. Holding on further tx for now, will monitor bp and symptoms closely.  Plan for discharge tomorrow   LOS: 2 days   Silvano Bilisoah B Roxsana Riding 01/25/2016, 12:40 PM

## 2016-01-26 LAB — RH IG WORKUP (INCLUDES ABO/RH)
ABO/RH(D): A NEG
Fetal Screen: NEGATIVE
Gestational Age(Wks): 39
UNIT DIVISION: 0

## 2016-01-26 MED ORDER — OXYCODONE HCL 5 MG PO TABS: 5.0000 mg | ORAL_TABLET | ORAL | 0 refills | Status: DC | PRN

## 2016-01-26 NOTE — Discharge Instructions (Signed)
Vaginal Delivery °During delivery, your health care provider will help you give birth to your baby. During a vaginal delivery, you will work to push the baby out of your vagina. However, before you can push your baby out, a few things need to happen. The opening of your uterus (cervix) has to soften, thin out, and open up (dilate) all the way to 10 cm. Also, your baby has to move down from the uterus into your vagina.  °SIGNS OF LABOR  °Your health care provider will first need to make sure you are in labor. Signs of labor include:  °· Passing what is called the mucous plug before labor begins. This is a small amount of blood-stained mucus. °· Having regular, painful uterine contractions.   °· The time between contractions gets shorter.   °· The discomfort and pain gradually get more intense. °· Contraction pains get worse when walking and do not go away when resting.   °· Your cervix becomes thinner (effacement) and dilates. °BEFORE THE DELIVERY °Once you are in labor and admitted into the hospital or care center, your health care provider may do the following:  °· Perform a complete physical exam. °· Review any complications related to pregnancy or labor.  °· Check your blood pressure, pulse, temperature, and heart rate (vital signs).   °· Determine if, and when, the rupture of amniotic membranes occurred. °· Do a vaginal exam (using a sterile glove and lubricant) to determine:   °¨ The position (presentation) of the baby. Is the baby's head presenting first (vertex) in the birth canal (vagina), or are the feet or buttocks first (breech)?   °¨ The level (station) of the baby's head within the birth canal.   °¨ The effacement and dilatation of the cervix.   °· An electronic fetal monitor is usually placed on your abdomen when you first arrive. This is used to monitor your contractions and the baby's heart rate. °¨ When the monitor is on your abdomen (external fetal monitor), it can only pick up the frequency and  length of your contractions. It cannot tell the strength of your contractions. °¨ If it becomes necessary for your health care provider to know exactly how strong your contractions are or to see exactly what the baby's heart rate is doing, an internal monitor may be inserted into your vagina and uterus. Your health care provider will discuss the benefits and risks of using an internal monitor and obtain your permission before inserting the device. °¨ Continuous fetal monitoring may be needed if you have an epidural, are receiving certain medicines (such as oxytocin), or have pregnancy or labor complications. °· An IV access tube may be placed into a vein in your arm to deliver fluids and medicines if necessary. °THREE STAGES OF LABOR AND DELIVERY °Normal labor and delivery is divided into three stages. °First Stage °This stage starts when you begin to contract regularly and your cervix begins to efface and dilate. It ends when your cervix is completely open (fully dilated). The first stage is the longest stage of labor and can last from 3 hours to 15 hours.  °Several methods are available to help with labor pain. You and your health care provider will decide which option is best for you. Options include:  °· Opioid medicines. These are strong pain medicines that you can get through your IV tube or as a shot into your muscle. These medicines lessen pain but do not make it go away completely.  °· Epidural. A medicine is given through a thin tube that   is inserted in your back. The medicine numbs the lower part of your body and prevents any pain in that area. °· Paracervical pain medicine. This is an injection of an anesthetic on each side of your cervix.   °· You may request natural childbirth, which does not involve the use of pain medicines or an epidural during labor and delivery. Instead, you will use other things, such as breathing exercises, to help cope with the pain. °Second Stage °The second stage of labor  begins when your cervix is fully dilated at 10 cm. It continues until you push your baby down through the birth canal and the baby is born. This stage can take only minutes or several hours. °· The location of your baby's head as it moves through the birth canal is reported as a number called a station. If the baby's head has not started its descent, the station is described as being at minus 3 (-3). When your baby's head is at the zero station, it is at the middle of the birth canal and is engaged in the pelvis. The station of your baby helps indicate the progress of the second stage of labor. °· When your baby is born, your health care provider may hold the baby with his or her head lowered to prevent amniotic fluid, mucus, and blood from getting into the baby's lungs. The baby's mouth and nose may be suctioned with a small bulb syringe to remove any additional fluid. °· Your health care provider may then place the baby on your stomach. It is important to keep the baby from getting cold. To do this, the health care provider will dry the baby off, place the baby directly on your skin (with no blankets between you and the baby), and cover the baby with warm, dry blankets.   °· The umbilical cord is cut. °Third Stage °During the third stage of labor, your health care provider will deliver the placenta (afterbirth) and make sure your bleeding is under control. The delivery of the placenta usually takes about 5 minutes but can take up to 30 minutes. After the placenta is delivered, a medicine may be given either by IV or injection to help contract the uterus and control bleeding. If you are planning to breastfeed, you can try to do so now. °After you deliver the placenta, your uterus should contract and get very firm. If your uterus does not remain firm, your health care provider will massage it. This is important because the contraction of the uterus helps cut off bleeding at the site where the placenta was attached  to your uterus. If your uterus does not contract properly and stay firm, you may continue to bleed heavily. If there is a lot of bleeding, medicines may be given to contract the uterus and stop the bleeding.  °  °This information is not intended to replace advice given to you by your health care provider. Make sure you discuss any questions you have with your health care provider. °  °Document Released: 01/03/2008 Document Revised: 04/16/2014 Document Reviewed: 11/21/2011 °Elsevier Interactive Patient Education ©2016 [employer] Inc[employer] ° °

## 2016-01-26 NOTE — Progress Notes (Signed)
Patient discharged. Infant made a "baby patient". Discharge paperwork and instructions reviewed with patient. Prescriptions given to patient.  No questions at this time.

## 2016-01-26 NOTE — Lactation Note (Signed)
This note was copied from a baby'Lane chart. Lactation Consultation Note  Patient Name: [name] WJXBJ'YToday'Lane Date: 01/26/2016 Reason for consult: Follow-up assessment;Infant weight loss Baby is 50 hours old and at a 10 % weight loss.  Mom breastfed her previous babies for 11 months without difficulty.  Mom just pumped with symphony DEBP and obtained a few mls of colostrum which was finger fed to baby.  Observed mom latch baby on using cradle hold. Latch is fairly shallow and baby does a lot of non-nutritive sucking.  Reviewed plan to feed with cues, post pump every 3 hours and supplement with 20-30 mls of expressed milk and or colostrum.  Offered SNS but mom prefers using a slow flow nipple.  Encouraged to call with concerns/assist prn  Maternal Data    Feeding Feeding Type: Breast Fed Length of feed: 10 min  LATCH Score/Interventions Latch: Grasps breast easily, tongue down, lips flanged, rhythmical sucking. Intervention(Lane): Adjust position;Assist with latch;Breast massage;Breast compression  Audible Swallowing: A few with stimulation Intervention(Lane): Skin to skin;Hand expression Intervention(Lane): Alternate breast massage  Type of Nipple: Everted at rest and after stimulation  Comfort (Breast/Nipple): Soft / non-tender     Hold (Positioning): Assistance needed to correctly position infant at breast and maintain latch. Intervention(Lane): Breastfeeding basics reviewed;Support Pillows;Position options;Skin to skin  LATCH Score: 8  Lactation Tools Discussed/Used WIC Program: No Pump Review: Setup, frequency, and cleaning;Milk Storage Initiated by:: RN Date initiated:: 01/26/16   Consult Status Consult Status: Follow-up Date: 01/27/16 Follow-up type: In-patient    [name], [name] 01/26/2016, 11:09 AM

## 2016-01-26 NOTE — Discharge Summary (Signed)
Physician Discharge Summary  Patient ID: [name] MRN: [id number] DOB/AGE: 06-14-1980 35 y.o.  Admit date: 01/23/2016 Discharge date: 01/26/2016  Admission Diagnoses: Severe features pre eclampsia   Discharge Diagnoses:  Active Problems:   Gestational hypertension   Discharged Condition: good  Hospital Course: induction of labor with rapid delivery unremarkable Pp course unremarkable  Consults: None  Significant Diagnostic Studies:   Treatments: Magnesium labor induction  Discharge Exam: Blood pressure 119/84, pulse 79, temperature 98.3 F (36.8 C), temperature source Oral, resp. rate 18, height 5\' 4"  (1.626 m), weight 185 lb 1.3 oz (84 kg), last menstrual period 04/09/2015, SpO2 98 %, unknown if currently breastfeeding. General appearance: alert, cooperative and no distress GI: soft, non-tender; bowel sounds normal; no masses,  no organomegaly  Disposition: 01-Home or Self Care  Discharge Instructions    Call MD for:  persistant nausea and vomiting    Complete by:  As directed    Call MD for:  severe uncontrolled pain    Complete by:  As directed    Call MD for:  temperature >100.4    Complete by:  As directed    Diet - low sodium heart healthy    Complete by:  As directed    Lifting restrictions    Complete by:  As directed    Weight restriction of 10 lbs.   Sexual acrtivity    Complete by:  As directed    No sex for 6 weeks       Medication List    STOP taking these medications   acetaminophen 160 MG/5ML suspension Commonly known as:  TYLENOL   aspirin 81 MG chewable tablet     TAKE these medications   citalopram 10 MG tablet Commonly known as:  CELEXA Take 10 mg by mouth at bedtime.   fluticasone 50 MCG/ACT nasal spray Commonly known as:  FLONASE Place 1 spray into both nostrils daily as needed for rhinitis.   loratadine 10 MG tablet Commonly known as:  CLARITIN Take 10 mg by mouth at bedtime as needed for allergies.   oxyCODONE 5  MG immediate release tablet Commonly known as:  Oxy IR/ROXICODONE Take 1 tablet (5 mg total) by mouth every 4 (four) hours as needed for moderate pain or severe pain.   prenatal vitamin w/FE, FA 29-1 MG Chew chewable tablet Chew 2 tablets by mouth at bedtime.      Follow-up Information    Lazaro ArmsEURE,Kaeley Vinje H, MD Follow up on 02/02/2016.   Specialties:  Obstetrics and Gynecology, Radiology Why:  BP check Contact information: [address] [place] [address] [phone]           Signed: Lazaro ArmsURE,Roseann Kees H 01/26/2016, 7:21 AM

## 2016-01-27 LAB — TYPE AND SCREEN
ABO/RH(D): A NEG
ANTIBODY SCREEN: POSITIVE
DAT, IgG: NEGATIVE
UNIT DIVISION: 0
UNIT DIVISION: 0

## 2016-01-31 ENCOUNTER — Encounter: Payer: Self-pay | Admitting: Advanced Practice Midwife

## 2016-01-31 ENCOUNTER — Ambulatory Visit (INDEPENDENT_AMBULATORY_CARE_PROVIDER_SITE_OTHER): Payer: Medicaid Other | Admitting: Advanced Practice Midwife

## 2016-01-31 VITALS — BP 106/68 | HR 94 | Ht 63.0 in | Wt 177.0 lb

## 2016-01-31 DIAGNOSIS — O1494 Unspecified pre-eclampsia, complicating childbirth: Secondary | ICD-10-CM

## 2016-01-31 DIAGNOSIS — Z0130 Encounter for examination of blood pressure without abnormal findings: Secondary | ICD-10-CM | POA: Diagnosis not present

## 2016-01-31 MED ORDER — MEDROXYPROGESTERONE ACETATE 150 MG/ML IM SUSP: 150.0000 mg | INTRAMUSCULAR | 3 refills | Status: DC

## 2016-01-31 NOTE — Progress Notes (Signed)
[facility] Visit  Patient name: [name] MRN [id number]  Date of birth: December 20, 1980  CC & HPI:  [name] is a 35 y.o. Caucasian female presenting today for BP check. She had IOL last week for GHTN.  Not sent home on meds. Breastfeeding well. Wants depo  Pertinent History Reviewed:  Medical & Surgical Hx:   Past Medical History:  Diagnosis Date  . Anemia   . Anxiety "fast hearbeat"   took Benadryl at end of pregnancy  . Back pain affecting pregnancy in first trimester 05/27/2015  . Complication of anesthesia    Epidural 1 sided  . Depression    During 1 pregnancy  . Diabetes mellitus without complication (HCC)   . Gestational diabetes    just started checking BS 03/05/12  . Headache(784.0)   . Hematuria 05/27/2015  . Irregular periods   . Nausea 01/27/2015  . Pregnancy induced hypertension   . Pregnant 01/27/2015  . Recurrent upper respiratory infection (URI)    Mostly when pregnant  . Spotting affecting pregnancy in first trimester 01/27/2015   Past Surgical History:  Procedure Laterality Date  . MULTIPLE TOOTH EXTRACTIONS    . WISDOM TOOTH EXTRACTION     Family History  Problem Relation Age of Onset  . Heart disease Maternal Grandmother   . Cancer Maternal Grandmother     brain  . Heart failure Maternal Grandmother   . Cancer Paternal Grandmother     brain  . Hypertension Mother   . Anxiety disorder Mother   . Depression Mother   . Hypertension Father   . Anxiety disorder Sister   . ADD / ADHD Daughter   . Asthma Daughter   . ADD / ADHD Daughter   . Seizures Daughter   . ADD / ADHD Daughter   . ADD / ADHD Daughter     Current Outpatient Prescriptions:  .  citalopram (CELEXA) 10 MG tablet, Take 10 mg by mouth at bedtime. , Disp: , Rfl:  .  fluticasone (FLONASE) 50 MCG/ACT nasal spray, Place 1 spray into both nostrils daily as needed for rhinitis. , Disp: , Rfl:  .  loratadine (CLARITIN) 10 MG tablet, Take 10 mg by mouth at bedtime as  needed for allergies. , Disp: , Rfl:  .  oxyCODONE (OXY IR/ROXICODONE) 5 MG immediate release tablet, Take 1 tablet (5 mg total) by mouth every 4 (four) hours as needed for moderate pain or severe pain., Disp: 30 tablet, Rfl: 0 .  prenatal vitamin w/FE, FA (NATACHEW) 29-1 MG CHEW chewable tablet, Chew 2 tablets by mouth at bedtime., Disp: , Rfl:  .  medroxyPROGESTERone (DEPO-PROVERA) 150 MG/ML injection, Inject 1 mL (150 mg total) into the muscle every 3 (three) months., Disp: 1 mL, Rfl: 3 Social History: Reviewed -  reports that she quit smoking about 17 years ago. Her smoking use included Cigarettes. She started smoking about 17 years ago. She has a 0.05 pack-year smoking history. She has never used smokeless tobacco.  Review of Systems:   Constitutional: Negative for fever and chills Eyes: Negative for visual disturbances Respiratory: Negative for shortness of breath, dyspnea Cardiovascular: Negative for chest pain or palpitations  Gastrointestinal: Negative for vomiting, diarrhea and constipation; no abdominal pain Genitourinary: Negative for dysuria and urgency, vaginal irritation or itching Musculoskeletal: Negative for back pain, joint pain, myalgias  Neurological: Negative for dizziness and headaches    Objective Findings:   Vitals:   01/31/16 1148  BP: 106/68  Pulse: 94  Physical Examination: General appearance - well appearing, and in no distress Mental status - alert, oriented to person, place, and time Chest:  Normal respiratory effort Heart - normal rate and regular rhythm  Musculoskeletal:  Normal range of motion without pain Extremities:  No edema    No results found for this or any previous visit (from the past 24 hour(s)).    Assessment & Plan:  A:   GHTN, resolved P:  [facility]   Return in about 3 weeks (around 02/21/2016) for postpartum check up and depo.  CRESENZO-DISHMAN,Joud Pettinato CNM 01/31/2016 12:14 PM

## 2016-02-21 ENCOUNTER — Ambulatory Visit: Payer: Medicaid Other | Admitting: Advanced Practice Midwife

## 2016-02-23 ENCOUNTER — Ambulatory Visit (INDEPENDENT_AMBULATORY_CARE_PROVIDER_SITE_OTHER): Payer: Medicaid Other | Admitting: Advanced Practice Midwife

## 2016-02-23 ENCOUNTER — Encounter: Payer: Self-pay | Admitting: Advanced Practice Midwife

## 2016-02-23 DIAGNOSIS — Z3042 Encounter for surveillance of injectable contraceptive: Secondary | ICD-10-CM | POA: Diagnosis not present

## 2016-02-23 DIAGNOSIS — Z3202 Encounter for pregnancy test, result negative: Secondary | ICD-10-CM

## 2016-02-23 DIAGNOSIS — Z308 Encounter for other contraceptive management: Secondary | ICD-10-CM

## 2016-02-23 LAB — POCT URINE PREGNANCY: Preg Test, Ur: NEGATIVE

## 2016-02-23 MED ORDER — MEDROXYPROGESTERONE ACETATE 150 MG/ML IM SUSP
150.0000 mg | Freq: Once | INTRAMUSCULAR | Status: AC
Start: 2016-02-23 — End: 2016-02-23
  Administered 2016-02-23: 150 mg via INTRAMUSCULAR

## 2016-02-23 NOTE — Progress Notes (Signed)
[name] is a 35 y.o. who presents for a postpartum visit. She is 4 weeks postpartum following a spontaneous vaginal delivery. I have fully reviewed the prenatal and intrapartum course. The delivery was at 39 gestational weeks.  IOL for GHTN Anesthesia: epidural. Postpartum course has been uneventful  Not on BP meds. Baby's course has been uneventful. Baby is feeding by breast. Bleeding: no bleeding. Bowel function is normal. Bladder function is normal. Patient is not sexually active. Contraception method is Depo-Provera injections. Postpartum depression screening: negative.   Current Outpatient Prescriptions:  .  citalopram (CELEXA) 10 MG tablet, Take 10 mg by mouth at bedtime. , Disp: , Rfl:  .  fluticasone (FLONASE) 50 MCG/ACT nasal spray, Place 1 spray into both nostrils daily as needed for rhinitis. , Disp: , Rfl:  .  loratadine (CLARITIN) 10 MG tablet, Take 10 mg by mouth at bedtime as needed for allergies. , Disp: , Rfl:  .  medroxyPROGESTERone (DEPO-PROVERA) 150 MG/ML injection, Inject 1 mL (150 mg total) into the muscle every 3 (three) months., Disp: 1 mL, Rfl: 3 .  oxyCODONE (OXY IR/ROXICODONE) 5 MG immediate release tablet, Take 1 tablet (5 mg total) by mouth every 4 (four) hours as needed for moderate pain or severe pain., Disp: 30 tablet, Rfl: 0 .  prenatal vitamin w/FE, FA (NATACHEW) 29-1 MG CHEW chewable tablet, Chew 2 tablets by mouth at bedtime., Disp: , Rfl:   Review of Systems   Constitutional: Negative for fever and chills Eyes: Negative for visual disturbances Respiratory: Negative for shortness of breath, dyspnea Cardiovascular: Negative for chest pain or palpitations  Gastrointestinal: Negative for vomiting, diarrhea and constipation Genitourinary: Negative for dysuria and urgency Musculoskeletal: Negative for back pain, joint pain, myalgias  Neurological: Negative for dizziness and headaches    Objective:     Vitals:   02/23/16 1551  BP: 108/68  Pulse: 84    General:  alert, cooperative and no distress   Breasts:  negative  Lungs: clear to auscultation bilaterally  Heart:  regular rate and rhythm  Abdomen: Soft, nontender   Vulva:  normal  Vagina: normal vagina  Cervix:  closed  Corpus: Well involuted     Rectal Exam: no hemorrhoids        Assessment:    normal postpartum exam.  Plan:   1. Contraception: Depo-Provera injections 2. Follow up in: 11-12 weeks for depoDictation #1 [id number]  [id number]  or as needed.

## 2016-05-25 ENCOUNTER — Ambulatory Visit: Payer: Medicaid Other

## 2017-02-14 ENCOUNTER — Encounter (HOSPITAL_COMMUNITY): Payer: Self-pay | Admitting: Family Medicine

## 2017-04-30 ENCOUNTER — Other Ambulatory Visit: Payer: Self-pay

## 2017-04-30 ENCOUNTER — Ambulatory Visit (INDEPENDENT_AMBULATORY_CARE_PROVIDER_SITE_OTHER): Payer: Medicaid Other | Admitting: Family Medicine

## 2017-04-30 ENCOUNTER — Encounter: Payer: Self-pay | Admitting: Family Medicine

## 2017-04-30 VITALS — BP 120/66 | HR 87 | Temp 98.4°F | Resp 16 | Ht 64.0 in | Wt 205.5 lb

## 2017-04-30 DIAGNOSIS — F419 Anxiety disorder, unspecified: Secondary | ICD-10-CM

## 2017-04-30 DIAGNOSIS — R5383 Other fatigue: Secondary | ICD-10-CM

## 2017-04-30 DIAGNOSIS — Z8632 Personal history of gestational diabetes: Secondary | ICD-10-CM | POA: Diagnosis not present

## 2017-04-30 DIAGNOSIS — Z23 Encounter for immunization: Secondary | ICD-10-CM

## 2017-04-30 DIAGNOSIS — R635 Abnormal weight gain: Secondary | ICD-10-CM | POA: Diagnosis not present

## 2017-04-30 DIAGNOSIS — N644 Mastodynia: Secondary | ICD-10-CM | POA: Diagnosis not present

## 2017-04-30 MED ORDER — CITALOPRAM HYDROBROMIDE 20 MG PO TABS: 20.0000 mg | ORAL_TABLET | Freq: Every day | ORAL | 3 refills | Status: DC

## 2017-04-30 NOTE — Progress Notes (Signed)
Patient ID: [name], female    DOB: 1980/10/05, 37 y.o.   MRN: [id number]  Chief Complaint  Patient presents with  . Anxiety    Allergies Patient has no known allergies.  Subjective:   [name] is a 37 y.o. female who presents to [facility] today.  HPI Here to establish care.  [name] reports that she does not work outside the home.  She has 7 children.  She is a busy wife and mom.  She reports that she has been on Celexa for several years.  Was placed on this medication for anxiety.  Reports that it helps her mood substantially.  Does not feel that she is never been depressed but does have anxious mood.  Feels like the medication helps her function better at home and improves her relations with her family.  Does not feel as stressed, worried, or irritable when she is consistent with taking the medication.  She reports that she initially was started on 10 mg and then the medication was increased to 20 mg a day.  She reports that she has not been as compliant with the medication over the past several weeks because she has forgotten to take her dosing.  She denies any suicidal or homicidal denies any hospitalizations for her mood in the past.  Reports she does not feel depressed.  She denies any domestic violence including physical and/or emotional abuse.  She reports that she is in a good marriage although they do have some struggles from time to time.  Reports at some point she would like to go back to work but that is not possible at this time with a 60-month-old daughter.  Reports that she did have gestational diabetes with 1 of her pregnancies.  She reports that over the past year has gained a great bit of weight.  She has not had her blood sugars checked in a long time but has had a history of hyperglycemia.  She reports that her mother has a history of thyroid dysfunction and is on thyroid replacement therapy.  Is concerned that she could have a thyroid  disorder because of her weight gain and low energy.  She reports that she does get tired but is extremely busy around her home.  Does not do exercise on a regular basis.  Appetite is good.  Denies any chest pain, shortness of breath, palpitations, or swelling in her extremities.  Does report that she has had some panic attacks in the past where she would get anxious feel very stressed and developed pains in her chest.  Denies these symptoms at this time.  She does report that she has had a history of pain in her breast proceeding and during her menses.  Reports that she has been seen by her OB/GYN for this in the past.  Reports that several months ago she had a lump in her breast prior to her menses and some tenderness in her armpit.  She reports that after her.  Stopped that the area went away.  She reports that she was evaluated for this and it was felt to be due to her menses.  She reports that she is currently on her menses.  She reports that she does have some tenderness in her right armpit and her right chest wall near the top of her breast.  She denies any skin changes in her breast.  Denies any nipple discharge.  She is currently not breast-feeding.  She reports that  she has had this similar pain and felt tenderness in her breast before during her menses but then it usually goes away.  She reports that her maternal grandmother did have a history of breast cancer.    Past Medical History:  Diagnosis Date  . Anemia   . Anxiety "fast hearbeat"   took Benadryl at end of pregnancy  . Back pain affecting pregnancy in first trimester 05/27/2015  . Complication of anesthesia    Epidural 1 sided  . Depression    During 1 pregnancy  . Diabetes mellitus without complication (HCC)   . Gestational diabetes    just started checking BS 03/05/12  . Headache(784.0)   . Hematuria 05/27/2015  . Irregular periods   . Nausea 01/27/2015  . Pregnancy induced hypertension   . Pregnant 01/27/2015  . Recurrent  upper respiratory infection (URI)    Mostly when pregnant  . Spotting affecting pregnancy in first trimester 01/27/2015    Past Surgical History:  Procedure Laterality Date  . MULTIPLE TOOTH EXTRACTIONS    . WISDOM TOOTH EXTRACTION      Family History  Problem Relation Age of Onset  . Heart disease Maternal Grandmother   . Cancer Maternal Grandmother        brain  . Heart failure Maternal Grandmother   . Cancer Paternal Grandmother        brain  . Hypertension Mother   . Anxiety disorder Mother   . Depression Mother   . Hypertension Father   . Anxiety disorder Sister   . ADD / ADHD Daughter   . Asthma Daughter   . ADD / ADHD Daughter   . Seizures Daughter   . ADD / ADHD Daughter   . ADD / ADHD Daughter      Social History   Socioeconomic History  . Marital status: Divorced    Spouse name: None  . Number of children: None  . Years of education: None  . Highest education level: None  Social Needs  . Financial resource strain: None  . Food insecurity - worry: None  . Food insecurity - inability: None  . Transportation needs - medical: None  . Transportation needs - non-medical: None  Occupational History  . None  Tobacco Use  . Smoking status: Former Smoker    Packs/day: 0.10    Years: 0.50    Pack years: 0.05    Types: Cigarettes    Start date: 02/19/1998    Last attempt to quit: 09/17/1998    Years since quitting: 18.6  . Smokeless tobacco: Never Used  Substance and Sexual Activity  . Alcohol use: No    Alcohol/week: 0.0 oz    Comment: not while pregnant  . Drug use: No  . Sexual activity: Yes    Partners: Male    Birth control/protection: None  Other Topics Concern  . None  Social History Narrative   Lives in [place], [place]   Does not work outside home.   Married for over 3 years.    Wear seatbelt.   Eats all food groups.   Enjoys dance.    Current Outpatient Medications on File Prior to Visit  Medication Sig Dispense Refill  . fluticasone  (FLONASE) 50 MCG/ACT nasal spray Place 1 spray into both nostrils daily as needed for rhinitis.     Marland Kitchen loratadine (CLARITIN) 10 MG tablet Take 10 mg by mouth at bedtime as needed for allergies.      No current facility-administered medications on file prior  to visit.     Review of Systems  Constitutional: Positive for unexpected weight change. Negative for appetite change, chills, diaphoresis, fatigue and fever.       She reports that she has gained weight and has been unable to lose all the weight from her pregnancy.  Respiratory: Negative for cough, shortness of breath and stridor.   Cardiovascular: Negative for chest pain, palpitations and leg swelling.  Gastrointestinal: Negative for abdominal pain, diarrhea and nausea.  Musculoskeletal: Negative for neck pain and neck stiffness.  Skin: Negative for rash.  Neurological: Negative for dizziness, weakness, numbness and headaches.  Psychiatric/Behavioral: Negative for behavioral problems, decreased concentration, dysphoric mood, hallucinations and self-injury. The patient is nervous/anxious.      Objective:   BP 120/66 (BP Location: Left Arm, Patient Position: Sitting, Cuff Size: Normal)   Pulse 87   Temp 98.4 F (36.9 C) (Temporal)   Resp 16   Ht 5\' 4"  (1.626 m)   Wt 205 lb 8 oz (93.2 kg)   LMP 04/27/2017   SpO2 98%   BMI 35.27 kg/m   Physical Exam  Constitutional: She is oriented to person, place, and time. She appears well-developed and well-nourished.  HENT:  Head: Normocephalic and atraumatic.  Nose: Nose normal.  Eyes: EOM are normal. Pupils are equal, round, and reactive to light.  Neck: Normal range of motion. Neck supple. No JVD present. No tracheal deviation present. No thyromegaly present.  Cardiovascular: Normal rate, regular rhythm and normal heart sounds.  Pulmonary/Chest: Effort normal and breath sounds normal. She exhibits no mass, no laceration, no edema, no deformity, no swelling and no retraction. Right  breast exhibits tenderness. Right breast exhibits no inverted nipple, no mass, no nipple discharge and no skin change. Left breast exhibits no inverted nipple, no mass, no nipple discharge, no skin change and no tenderness.  Dense breast bilaterally.  Right breast with greater densities upon palpation than left.  No discrete masses or lesions palpated.  No nipple discharge bilaterally.  Area of mild tenderness at the 10 to 12 o'clock position approximately 4 cm from the nipple.  Abdominal: Soft. Bowel sounds are normal.  Lymphadenopathy:    She has no cervical adenopathy.  Neurological: She is alert and oriented to person, place, and time.  Skin: Skin is warm and dry.  Psychiatric: She has a normal mood and affect. Her behavior is normal. Judgment and thought content normal.  Vitals reviewed.    Assessment and Plan   1. Breast tenderness, suspect secondary to menses. Patient with history of cyclical menstrual related breast tenderness and pain and an axillary region.  Currently on menses today with some tenderness appreciated on exam but no discrete lesion present.  Discussion with patient today regarding option.  Patient given option for ultrasound and mammogram versus return to clinic in 2 weeks for evaluation when she is off her menses.  At this time patient wishes to return to clinic in 2 weeks for repeat breast exam.  If pain or lesion present at that time will proceed with radiologic workup.  2. History of gestational diabetes Patient is a risk for diabetes and has had weight gain.  Diet, exercise and weight loss recommended.  Check labs today - Hemoglobin A1c  3. Weight gain Possibly secondary to thyroid versus diabetes versus inactivity and diet.  At this time check metabolic tests.  Weight loss and dietary recommendations discussed with patient.  She was encouraged to decrease soft drinks which would help with breast tenderness and  with weight loss.  4. Fatigue, unspecified type  -  CBC with Differential/Platelet - TSH  5. Anxiety Celexa 20 mg 1 p.o. daily.  Compliance and daily use of medication as directed recommended.  Patient counseled concerning risks versus benefits of medication.  She was counseled that if she developed any problems with her mood to follow-up or contact medical help.  She voiced understanding.  Suicide risks evaluated and documented in note if present or in the area below.  Patient does not have/denies the following risks: previous suicide attempts, family history of suicide, access to lethal means, prior history of psychiatric disorder, history of alcohol or substance abuse disorder, recent loss of a loved one, or severe hopelessness.  Patient specifically denies suicide ideation. Patient has access/information to healthcare contacts if situation or mood changes where patient is a risk to self or others or mood becomes unstable.   During the encounter, the patient had good eye contact and firm handshake regarding safety contract and agreement to seek help if mood worsens and not to harm self.   Patient understands the treatment plan and is in agreement. Agrees to keep follow up and call prior or return to clinic if needed.   - citalopram (CELEXA) 20 MG tablet; Take 1 tablet (20 mg total) by mouth daily.  Dispense: 30 tablet; Refill: 3  6. Immunization due  - Flu Vaccine QUAD 6+ mos PF IM (Fluarix Quad PF)  Return in about 2 weeks (around 05/14/2017). Aliene Beamsachel Shyne Resch, MD 04/30/2017

## 2017-05-01 LAB — CBC WITH DIFFERENTIAL/PLATELET
BASOS ABS: 57 {cells}/uL (ref 0–200)
Basophils Relative: 0.9 %
EOS PCT: 0.9 %
Eosinophils Absolute: 57 cells/uL (ref 15–500)
HCT: 39.2 % (ref 35.0–45.0)
Hemoglobin: 12.8 g/dL (ref 11.7–15.5)
LYMPHS ABS: 1588 {cells}/uL (ref 850–3900)
MCH: 28.3 pg (ref 27.0–33.0)
MCHC: 32.7 g/dL (ref 32.0–36.0)
MCV: 86.5 fL (ref 80.0–100.0)
MPV: 10.3 fL (ref 7.5–12.5)
Monocytes Relative: 9.9 %
NEUTROS PCT: 63.1 %
Neutro Abs: 3975 cells/uL (ref 1500–7800)
Platelets: 354 10*3/uL (ref 140–400)
RBC: 4.53 10*6/uL (ref 3.80–5.10)
RDW: 13.6 % (ref 11.0–15.0)
Total Lymphocyte: 25.2 %
WBC mixed population: 624 cells/uL (ref 200–950)
WBC: 6.3 10*3/uL (ref 3.8–10.8)

## 2017-05-01 LAB — HEMOGLOBIN A1C
Hgb A1c MFr Bld: 5.5 % of total Hgb (ref <5.7)
MEAN PLASMA GLUCOSE: 111 (calc)
eAG (mmol/L): 6.2 (calc)

## 2017-05-01 LAB — TSH: TSH: 3.05 mIU/L

## 2017-05-02 ENCOUNTER — Telehealth: Payer: Self-pay | Admitting: Family Medicine

## 2017-05-02 ENCOUNTER — Encounter: Payer: Self-pay | Admitting: Family Medicine

## 2017-05-02 NOTE — Telephone Encounter (Signed)
Please call patient and let her know that her thyroid, blood counts, and blood sugars were within normal limits.  She does not have evidence of diabetes at this time.  She will need to continue to be monitored on a yearly basis due to her history of gestational diabetes.  Please advise her to keep her scheduled follow-up.

## 2017-05-03 NOTE — Telephone Encounter (Signed)
Patient informed of message below, verbalized understanding.  

## 2017-05-21 ENCOUNTER — Ambulatory Visit: Payer: Medicaid Other | Admitting: Family Medicine

## 2017-06-18 ENCOUNTER — Ambulatory Visit: Payer: Medicaid Other | Admitting: Family Medicine

## 2017-07-03 ENCOUNTER — Encounter: Payer: Self-pay | Admitting: Family Medicine

## 2017-07-03 ENCOUNTER — Other Ambulatory Visit: Payer: Self-pay

## 2017-07-03 ENCOUNTER — Ambulatory Visit (INDEPENDENT_AMBULATORY_CARE_PROVIDER_SITE_OTHER): Payer: Medicaid Other | Admitting: Family Medicine

## 2017-07-03 ENCOUNTER — Telehealth: Payer: Self-pay | Admitting: Family Medicine

## 2017-07-03 VITALS — BP 122/78 | HR 86 | Temp 97.5°F | Resp 16 | Ht 64.0 in | Wt 210.0 lb

## 2017-07-03 DIAGNOSIS — F419 Anxiety disorder, unspecified: Secondary | ICD-10-CM | POA: Diagnosis not present

## 2017-07-03 DIAGNOSIS — F329 Major depressive disorder, single episode, unspecified: Secondary | ICD-10-CM

## 2017-07-03 DIAGNOSIS — N644 Mastodynia: Secondary | ICD-10-CM

## 2017-07-03 DIAGNOSIS — F32A Depression, unspecified: Secondary | ICD-10-CM

## 2017-07-03 MED ORDER — FLUOXETINE HCL 20 MG PO TABS: 20.0000 mg | ORAL_TABLET | Freq: Every day | ORAL | 1 refills | Status: DC

## 2017-07-03 NOTE — Telephone Encounter (Signed)
Corrected

## 2017-07-03 NOTE — Patient Instructions (Signed)
Schedule mammogram at check-out 

## 2017-07-03 NOTE — Progress Notes (Signed)
Patient ID: [name], female    DOB: 10-04-1980, 37 y.o.   MRN: [id number]  Chief Complaint  Patient presents with  . Follow-up    Allergies Patient has no known allergies.  Subjective:   [name] is a 37 y.o. female who presents to [facility] today.  HPI [name] presents today for follow-up visit.  Since her last visit she has consistently been taking the Celexa each day and she does report some mild improvement in her mood.  She reports that her main concern with this medication is weight gain that she has experienced while taking this.  After the birth of her 26-month-old daughter she was able to lose back down to 160 pounds.  She was subsequently placed on Celexa by her previous doctor and reports that she is continued to gain weight while taking this medication.  She has gained between 40 and 50 pounds since starting this medication.  Since her last visit she has gained 5 pounds.  She attributes this weight gain to the medication.  She does believe that she needs to be on a medication for her mood but is not interested in being on this medicine because she believes that it is causing weight gain.  She reports that she does feel down about her weight.  She reports she has difficulty swallowing pills and needs to be on a medication that can be crushed.  She reports she cannot take capsules.  She reports that she has always had these issues with taking medication.  She has never seen a psychiatrist or a therapist in the past.  She does not recall being on any other medications for depression other than the Celexa.  She does report some marital struggles with her husband.  She reports that she does have young children and 7 total children which can tend to be stressful.  She is home with her children all day.  She denies any domestic violence.  She feels safe in her home.  She also reports that she is here to follow-up for breast pain.  She reports she is continued to  have episodic pain associated with her right breast.  She reports that she can feel a specific area of increased tenderness in her right breast.  She denies any nipple discharge.  She reports that over the past several years that her right breast has gotten smaller than the left breast.  She correlates this to having had a yeast infection while breast-feeding her daughter.  She reports that the tenderness in her breast is not always associated with her menses.  She does have a maternal grandmother with a history of breast cancer.  She has never had a mammogram herself.  She denies any nipple discharge.  Denies any skin changes in her breasts.    Depression       The patient presents with depression.  This is a chronic problem.  The current episode started more than 1 year ago.   The onset quality is gradual.   The problem occurs daily.  The problem has been waxing and waning since onset.  Associated symptoms include fatigue, hopelessness, irritable, decreased interest and sad.  Associated symptoms include no decreased concentration, does not have insomnia, no appetite change, no body aches, no myalgias, no headaches, no indigestion and no suicidal ideas.     The symptoms are aggravated by family issues.  Past treatments include SSRIs - Selective serotonin reuptake inhibitors.  Compliance with treatment is variable.  Previous treatment provided mild relief.  Risk factors include marital problems.   Past medical history includes anxiety and depression.     Pertinent negatives include no suicide attempts.   Past Medical History:  Diagnosis Date  . Anemia   . Anxiety "fast hearbeat"   took Benadryl at end of pregnancy  . Back pain affecting pregnancy in first trimester 05/27/2015  . Complication of anesthesia    Epidural 1 sided  . Depression    During 1 pregnancy  . Diabetes mellitus without complication (HCC)   . Gestational diabetes    just started checking BS 03/05/12  . Headache(784.0)   .  Hematuria 05/27/2015  . Irregular periods   . Nausea 01/27/2015  . Pregnancy induced hypertension   . Pregnant 01/27/2015  . Recurrent upper respiratory infection (URI)    Mostly when pregnant  . Spotting affecting pregnancy in first trimester 01/27/2015    Past Surgical History:  Procedure Laterality Date  . MULTIPLE TOOTH EXTRACTIONS    . WISDOM TOOTH EXTRACTION      Family History  Problem Relation Age of Onset  . Heart disease Maternal Grandmother   . Cancer Maternal Grandmother        brain  . Heart failure Maternal Grandmother   . Cancer Paternal Grandmother        brain  . Hypertension Mother   . Anxiety disorder Mother   . Depression Mother   . Hypertension Father   . Anxiety disorder Sister   . ADD / ADHD Daughter   . Asthma Daughter   . ADD / ADHD Daughter   . Seizures Daughter   . ADD / ADHD Daughter   . ADD / ADHD Daughter      Social History   Socioeconomic History  . Marital status: Divorced    Spouse name: Not on file  . Number of children: Not on file  . Years of education: Not on file  . Highest education level: Not on file  Occupational History  . Not on file  Social Needs  . Financial resource strain: Not on file  . Food insecurity:    Worry: Not on file    Inability: Not on file  . Transportation needs:    Medical: Not on file    Non-medical: Not on file  Tobacco Use  . Smoking status: Former Smoker    Packs/day: 0.10    Years: 0.50    Pack years: 0.05    Types: Cigarettes    Start date: 02/19/1998    Last attempt to quit: 09/17/1998    Years since quitting: 18.8  . Smokeless tobacco: Never Used  Substance and Sexual Activity  . Alcohol use: No    Alcohol/week: 0.0 oz    Comment: not while pregnant  . Drug use: No  . Sexual activity: Yes    Partners: Male    Birth control/protection: None  Lifestyle  . Physical activity:    Days per week: Not on file    Minutes per session: Not on file  . Stress: Not on file    Relationships  . Social connections:    Talks on phone: Not on file    Gets together: Not on file    Attends religious service: Not on file    Active member of club or organization: Not on file    Attends meetings of clubs or organizations: Not on file    Relationship status: Not on file  Other Topics Concern  .  Not on file  Social History Narrative   Lives in [place], [place]   Does not work outside home.   Married for over 3 years.    Wear seatbelt.   Eats all food groups.   Enjoys dance.     Review of Systems  Constitutional: Positive for fatigue and unexpected weight change. Negative for appetite change and fever.  Musculoskeletal: Negative for myalgias.  Neurological: Negative for headaches.  Psychiatric/Behavioral: Positive for agitation, depression and dysphoric mood. Negative for decreased concentration, hallucinations, self-injury, sleep disturbance and suicidal ideas. The patient is nervous/anxious. The patient does not have insomnia.      Objective:   BP 122/78 (BP Location: Left Arm, Patient Position: Sitting, Cuff Size: Normal)   Pulse (!) 108   Temp (!) 97.5 F (36.4 C) (Temporal)   Resp 16   Ht 5\' 4"  (1.626 m)   Wt 210 lb (95.3 kg)   LMP 06/03/2017 (Exact Date)   SpO2 97%   BMI 36.05 kg/m   Physical Exam  Constitutional: She is oriented to person, place, and time. She appears well-developed and well-nourished. She is irritable. No distress.  HENT:  Head: Normocephalic and atraumatic.  Eyes: Pupils are equal, round, and reactive to light.  Neck: Normal range of motion. Neck supple. No thyromegaly present.  Cardiovascular: Normal rate, regular rhythm and normal heart sounds.  Pulmonary/Chest: Effort normal and breath sounds normal. No respiratory distress.  Breast size discrepancy present with right breast smaller than left breast. Right breast at approximate 1 o'clock position, upper medial quadrant, tenderness to palpation and palpable area of tissue  density which corresponds to area of pain.  Directly out from nipple at 1 o'clock position, 5-6 cm of tenderness present.  No skin lesions present.  No nipple discharge present.  Neurological: She is alert and oriented to person, place, and time. No cranial nerve deficit.  Skin: Skin is warm and dry.  Nursing note and vitals reviewed.   Depression screen [facility] 2/9 07/03/2017 07/03/2017 04/30/2017  Decreased Interest 0 0 0  Down, Depressed, Hopeless 2 0 0  PHQ - 2 Score 2 0 0  Altered sleeping 1 - -  Tired, decreased energy 3 - -  Feeling bad or failure about yourself  3 - -  Trouble concentrating 0 - -  Suicidal thoughts 1 - -  PHQ-9 Score 10 - -    Assessment and Plan  1. Anxiety and depression Virtual behavioral health referral placed. DC Celexa at this time.  We did discuss possible side effect of weight gain with Celexa. Initially recommendation for new medication was Effexor Exar.  However patient unable and unwilling to swallow capsules.  She requests medication which can be crushed due to her inability to swallow pills.  At this time will start Prozac 20 mg a day.Patient counseled in detail regarding the risks of medication. Told to call or return to clinic if develop any worrisome signs or symptoms. Patient voiced understanding.  Suicide risks evaluated and documented in note if present or in the area below.  Patient has protective factors of family and community support.  Patient reports that family believes is behaving rationally. Patient displays problem solving skills.   Patient specifically denies suicide ideation. Patient has access/information to healthcare contacts if situation or mood changes where patient is a risk to self or others or mood becomes unstable.   During the encounter, the patient had good eye contact and firm handshake regarding safety contract and agreement to seek help  if mood worsens and not to harm self.   Patient understands the treatment plan and is  in agreement. Agrees to keep follow up and call prior or return to clinic if needed.   - FLUoxetine (PROZAC) 20 MG tablet; Take 1 tablet (20 mg total) by mouth daily.  Dispense: 30 tablet; Refill: 1  2. Breast pain Will refer for diagnostic mammogram at this time.  Did discuss with patient this could be possibly normal breast tissue.  At this time will initiate further workup. - MM Digital Diagnostic Unilat R; Future  Patient was told to call with any questions or concerns.  If she has any changes in her mood or any issues.  If she is unable to take the medication.  Please call prior to just following up. Return in about 1 month (around 07/31/2017) for Follow-up. Aliene Beams, MD 07/03/2017

## 2017-07-03 NOTE — Telephone Encounter (Signed)
Order for mammogram needs to be changed to be scheduled:  1. Needs to be a bilateral diagnostic mammogram   2. Right/left breast limited ultrasound  3. Breast pain-- cant be the whole breast, needs to pin point the specific area of pain  Per Darel HongJudy at central scheduling.

## 2017-07-04 ENCOUNTER — Telehealth: Payer: Self-pay

## 2017-07-04 DIAGNOSIS — F419 Anxiety disorder, unspecified: Principal | ICD-10-CM

## 2017-07-04 DIAGNOSIS — F329 Major depressive disorder, single episode, unspecified: Secondary | ICD-10-CM

## 2017-07-04 NOTE — Telephone Encounter (Signed)
[facility] - left message.  

## 2017-07-24 ENCOUNTER — Telehealth: Payer: Self-pay

## 2017-07-24 NOTE — Telephone Encounter (Signed)
[facility] left a voice mail message   

## 2017-07-30 ENCOUNTER — Telehealth: Payer: Self-pay

## 2017-07-30 NOTE — Telephone Encounter (Signed)
[facility] - left message.  

## 2017-08-08 ENCOUNTER — Ambulatory Visit (INDEPENDENT_AMBULATORY_CARE_PROVIDER_SITE_OTHER): Payer: Medicaid Other | Admitting: Family Medicine

## 2017-08-08 ENCOUNTER — Encounter: Payer: Self-pay | Admitting: Family Medicine

## 2017-08-08 VITALS — BP 120/82 | HR 89 | Resp 16 | Ht 64.0 in | Wt 204.0 lb

## 2017-08-08 DIAGNOSIS — N644 Mastodynia: Secondary | ICD-10-CM | POA: Diagnosis not present

## 2017-08-08 DIAGNOSIS — M67911 Unspecified disorder of synovium and tendon, right shoulder: Secondary | ICD-10-CM

## 2017-08-08 DIAGNOSIS — F419 Anxiety disorder, unspecified: Secondary | ICD-10-CM | POA: Diagnosis not present

## 2017-08-08 MED ORDER — DICLOFENAC SODIUM 75 MG PO TBEC: 75.0000 mg | DELAYED_RELEASE_TABLET | Freq: Two times a day (BID) | ORAL | 0 refills | Status: DC

## 2017-08-08 NOTE — Progress Notes (Signed)
Patient ID: [name], female    DOB: [birth year], 37 y.o.   MRN: [id number]  Chief Complaint  Patient presents with  . Shoulder Pain    right shoulder/collerbone pain that started around December. Sometimes the pain will run down her arm.     Allergies Patient has no known allergies.  Subjective:   [name] is a 37 y.o. female who presents to [facility] today.  HPI Shoulder pain on and off for the past couple months. 2-3/10 now. Radiates to arm pit and then down arm. Can feel some tingling in arm at times with the pain.  Reports that not sure what her symptoms were when she initially started having the pain.  However, she reports that she does get that pain when she picks up her daughter with that arm.  She reports that she is noticed it more since her daughter is gotten heavier.  She does not recall any specific trauma.  She was also seen at the end of March for some recurrent pain in her right breast.  She had reported that the pain in the breast was still episodic but persistent.  She was set up for a diagnostic mammogram but reports she was never called with the appointment.  She reports that she does still occasionally have pain in her right breast.  Reports that over the past year so her right breast is gotten smaller than her left.  She thought this was due to the fact that she had an infection in that breast.  She reports that the tenderness in her breast is usually in the upper outer breast quadrant.  She denies any nipple discharge.  She reports this is a different pain from her shoulder pain.  She has not taken any medication for the breast pain.  She reports that the Aleve or ibuprofen has helped some with the shoulder pain but that the pain then subsequently returns when the medicine wears off.  She reports that she did not start the Prozac but she did stop the Celexa.  She reports that she believes that she could benefit from some anxiety medication but  she just wanted to discuss it again.  She reports that she was concerned because someone had told her that the Prozac could make her go crazy.  She reports that she does feel anxious and gets stressed often.  She does report though that however after stopping the Celexa for less than a month she has lost 6 pounds.  She does believe that the Celexa is what caused her to gain over 40 pounds.  She denies feeling any suicidal or homicidal ideations.  Shoulder Pain   The pain is present in the right shoulder. This is a recurrent problem. The current episode started more than 1 month ago. There has been no history of extremity trauma. The problem occurs intermittently. The quality of the pain is described as dull. The pain is at a severity of 2/10. The pain is severe. Pertinent negatives include no fever. The symptoms are aggravated by activity. She has tried acetaminophen for the symptoms. The treatment provided mild relief. Family history does not include gout or rheumatoid arthritis. There is no history of osteoarthritis.    Past Medical History:  Diagnosis Date  . Anemia   . Anxiety "fast hearbeat"   took Benadryl at end of pregnancy  . Back pain affecting pregnancy in first trimester 05/27/2015  . Complication of anesthesia    Epidural 1 sided  .  Depression    During 1 pregnancy  . Diabetes mellitus without complication (HCC)   . Gestational diabetes    just started checking BS 03/05/12  . Headache(784.0)   . Hematuria 05/27/2015  . Irregular periods   . Nausea 01/27/2015  . Pregnancy induced hypertension   . Pregnant 01/27/2015  . Recurrent upper respiratory infection (URI)    Mostly when pregnant  . Spotting affecting pregnancy in first trimester 01/27/2015    Past Surgical History:  Procedure Laterality Date  . MULTIPLE TOOTH EXTRACTIONS    . WISDOM TOOTH EXTRACTION      Family History  Problem Relation Age of Onset  . Heart disease Maternal Grandmother   . Cancer Maternal  Grandmother        brain  . Heart failure Maternal Grandmother   . Cancer Paternal Grandmother        brain  . Hypertension Mother   . Anxiety disorder Mother   . Depression Mother   . Hypertension Father   . Anxiety disorder Sister   . ADD / ADHD Daughter   . Asthma Daughter   . ADD / ADHD Daughter   . Seizures Daughter   . ADD / ADHD Daughter   . ADD / ADHD Daughter      Social History   Socioeconomic History  . Marital status: Divorced    Spouse name: Not on file  . Number of children: Not on file  . Years of education: Not on file  . Highest education level: Not on file  Occupational History  . Not on file  Social Needs  . Financial resource strain: Not on file  . Food insecurity:    Worry: Not on file    Inability: Not on file  . Transportation needs:    Medical: Not on file    Non-medical: Not on file  Tobacco Use  . Smoking status: Former Smoker    Packs/day: 0.10    Years: 0.50    Pack years: 0.05    Types: Cigarettes    Start date: 02/19/1998    Last attempt to quit: 09/17/1998    Years since quitting: 18.9  . Smokeless tobacco: Never Used  Substance and Sexual Activity  . Alcohol use: No    Alcohol/week: 0.0 oz    Comment: not while pregnant  . Drug use: No  . Sexual activity: Yes    Partners: Male    Birth control/protection: None  Lifestyle  . Physical activity:    Days per week: Not on file    Minutes per session: Not on file  . Stress: Not on file  Relationships  . Social connections:    Talks on phone: Not on file    Gets together: Not on file    Attends religious service: Not on file    Active member of club or organization: Not on file    Attends meetings of clubs or organizations: Not on file    Relationship status: Not on file  Other Topics Concern  . Not on file  Social History Narrative   Lives in [place], [place]   Does not work outside home.   Married for over 3 years.    Wear seatbelt.   Eats all food groups.   Enjoys  dance.     Review of Systems  Constitutional: Negative for fever.     Objective:   BP 120/82   Pulse 89   Resp 16   Ht  (1.626 m)  Wt 204 lb (92.5 kg)   SpO2 98%   BMI 35.02 kg/m   Physical Exam  Pulmonary/Chest: Effort normal. Right breast exhibits tenderness. Right breast exhibits no nipple discharge and no skin change. Left breast exhibits no inverted nipple, no mass, no nipple discharge, no skin change and no tenderness.  Right breast smaller than left in size.  Right breast with questionable fibrocystic changes at the 9 through 12 o'clock position.  Slightly tender to palpation.  No discrete masses palpated.  No nipple discharge.  No overlying breast skin changes.  No chest wall retractions or deformities present.  Musculoskeletal:       Right shoulder: She exhibits decreased range of motion, tenderness and pain. She exhibits no bony tenderness, no swelling, no effusion, no crepitus, no deformity, no laceration, normal pulse and normal strength.  Patient with pain at abduction of arm.  Positive Neer's/Hawkins.  No sensation changes in upper and lower extremities.  Good grip strength bilaterally.  Neuro vasculature in upper and lower extremity intact.  Psychiatric: Her speech is normal and behavior is normal. Judgment and thought content normal. Her mood appears anxious. Cognition and memory are normal.  Alert and oriented.  No acute distress.  No suicidal or homicidal ideations.  Thought process is goal-directed without delusions, phobias, obsessions or compulsions.     Assessment and Plan  1. Rotator cuff disorder, right Discussed shoulder pathology including rotator cuff tendinitis versus impingement.  At this time we will try Voltaren twice a day.  We will do this for 2 weeks and see if patient has improvement.  Also obtain x-ray of the shoulder. Discussed risks of cardiovascular thrombotic events related to NSAIDS. Discussed increased risk of AMI and CVA. Discussed  risk of serious GI adverse events including bleeding, ulcers, and perforation. Patient understands risks of this medication.  Patient is not breast-feeding.  She is not pregnant. If symptoms are not better consider referral to orthopedics versus further diagnostic imaging.  Patient will follow-up in 1 month. - DG Shoulder Right; Future - diclofenac (VOLTAREN) 75 MG EC tablet; Take 1 tablet (75 mg total) by mouth 2 (two) times daily.  Dispense: 30 tablet; Refill: 0  2. Anxiety Patient will start Prozac at this time.  She was counseled versus the risks and benefits of medication.  She reports that she does have questions regarding the medication and wanted reassurance and asked the questions.  Her questions were answered regarding possible side effects, mechanism of action, and indications for the medication. She was told to call with any questions or concerns or any problems upon initiation of medication. Suicide risks evaluated and documented in note if present or in the area below.  Patient has protective factors of family and community support.  Patient reports that family believes is behaving rationally. Patient displays problem solving skills.   Patient specifically denies suicide ideation. Patient has access/information to healthcare contacts if situation or mood changes where patient is a risk to self or others or mood becomes unstable.   During the encounter, the patient had good eye contact and firm handshake regarding safety contract and agreement to seek help if mood worsens and not to harm self.   Patient understands the treatment plan and is in agreement. Agrees to keep follow up and call prior or return to clinic if needed.   3.  Breast pain Referral reviewed.  The mammogram had been authorized but has not been scheduled.  Mammogram was scheduled and patient was given information regarding  the appointment at today's visit.  She will follow-up in 1 month.  She was counseled  concerning worrisome signs and symptoms of breast pain and told if those occur to please contact office.  She voiced understanding. Return in about 1 month (around 09/05/2017) for follow up. Aliene Beams, MD 08/09/2017

## 2017-08-08 NOTE — Patient Instructions (Signed)
Rotator Cuff Tendinitis Rotator cuff tendinitis is inflammation of the tough, cord-like bands that connect muscle to bone (tendons) in the rotator cuff. The rotator cuff includes all of the muscles and tendons that connect the arm to the shoulder. The rotator cuff holds the head of he upper arm bone (humerus) in the cup (fossa) of the shoulder blade (scapula). This condition can lead to a long-lasting (chronic) tear. The tear may be partial or complete. What are the causes? This condition is usually caused by overusing the rotator cuff. What increases the risk? This condition is more likely to develop in athletes and workers who frequently use their shoulder or reach over their heads. This can include activities such as:  Tennis.  Baseball or softball.  Swimming.  Construction work.  Painting.  What are the signs or symptoms? Symptoms of this condition include:  Pain spreading (radiating) from the shoulder to the upper arm.  Swelling and tenderness in front of the shoulder.  Pain when reaching, pulling, or lifting the arm above the head.  Pain when lowering the arm from above the head.  Minor pain in the shoulder when resting.  Increased pain in the shoulder at night.  Difficulty placing the arm behind the back.  How is this diagnosed? This condition is diagnosed with a medical history and physical exam. Tests may also be done, including:  X-rays.  MRI.  Ultrasounds.  CT or MR arthrogram. During this test, a contrast material is injected and then images are taken.  How is this treated? Treatment for this condition depends on the severity of the condition. In less severe cases, treatment may include:  Rest. This may be done with a sling that holds the shoulder still (immobilization). Your health care provider may also recommend avoiding activities that involve lifting your arm over your head.  Icing the shoulder.  Anti-inflammatory medicines, such as aspirin or  ibuprofen.  In more severe cases, treatment may include:  Physical therapy.  Steroid injections.  Surgery.  Follow these instructions at home: If you have a sling:  Wear the sling as told by your health care provider. Remove it only as told by your health care provider.  Loosen the sling if your fingers tingle, become numb, or turn cold and blue.  Keep the sling clean.  If the sling is not waterproof, do not let it get wet. Remove it, if allowed, or cover it with a watertight covering when you take a bath or shower. Managing pain, stiffness, and swelling  If directed, put ice on the injured area. ? If you have a removable sling, remove it as told by your health care provider. ? Put ice in a plastic bag. ? Place a towel between your skin and the bag. ? Leave the ice on for 20 minutes, 2-3 times a day.  Move your fingers often to avoid stiffness and to lessen swelling.  Raise (elevate) the injured area above the level of your heart while you are lying down.  Find a comfortable sleeping position or sleep on a recliner, if available. Driving  Do not drive or use heavy machinery while taking prescription pain medicine.  Ask your health care provider when it is safe to drive if you have a sling on your arm. Activity  Rest your shoulder as told by your health care provider.  Return to your normal activities as told by your health care provider. Ask your health care provider what activities are safe for you.  Do any  exercises or stretches as told by your health care provider.  If you do repetitive overhead tasks, take small breaks in between and include stretching exercises as told by your health care provider. General instructions  Do not use any products that contain nicotine or tobacco, such as cigarettes and e-cigarettes. These can delay healing. If you need help quitting, ask your health care provider.  Take over-the-counter and prescription medicines only as told by  your health care provider.  Keep all follow-up visits as told by your health care provider. This is important. Contact a health care provider if:  Your pain gets worse.  You have new pain in your arm, hands, or fingers.  Your pain is not relieved with medicine or does not get better after 6 weeks of treatment.  You have cracking sensations when moving your shoulder in certain directions.  You hear a snapping sound after using your shoulder, followed by severe pain and weakness. Get help right away if:  Your arm, hand, or fingers are numb or tingling.  Your arm, hand, or fingers are swollen or painful or they turn white or blue. Summary  Rotator cuff tendinitis is inflammation of the tough, cord-like bands that connect muscle to bone (tendons) in the rotator cuff.  This condition is usually caused by overusing the rotator cuff, which includes all of the muscles and tendons that connect the arm to the shoulder.  This condition is more likely to develop in athletes and workers who frequently use their shoulder or reach over their heads.  Treatment generally includes rest, anti-inflammatory medicines, and icing. In some cases, physical therapy and steroid injections may be needed. In severe cases, surgery may be needed. This information is not intended to replace advice given to you by your health care provider. Make sure you discuss any questions you have with your health care provider. Document Released: 06/16/2003 Document Revised: 03/12/2016 Document Reviewed: 03/12/2016 Elsevier Interactive Patient Education  2017 [employer]  

## 2017-08-13 ENCOUNTER — Telehealth: Payer: Self-pay | Admitting: Family Medicine

## 2017-08-13 NOTE — Telephone Encounter (Signed)
Left voicemail w/ patients new appt date/time. I mailed reminder.

## 2017-08-20 ENCOUNTER — Ambulatory Visit (HOSPITAL_COMMUNITY)
Admission: RE | Admit: 2017-08-20 | Discharge: 2017-08-20 | Disposition: A | Discharge status: Home / Self Care | Payer: Medicaid Other | Source: Ambulatory Visit | Attending: Family Medicine | Admitting: Family Medicine

## 2017-08-20 ENCOUNTER — Encounter (HOSPITAL_COMMUNITY): Payer: Self-pay

## 2017-08-20 DIAGNOSIS — N644 Mastodynia: Secondary | ICD-10-CM | POA: Insufficient documentation

## 2017-09-05 ENCOUNTER — Ambulatory Visit: Payer: Medicaid Other | Admitting: Family Medicine

## 2017-09-06 ENCOUNTER — Ambulatory Visit: Payer: Medicaid Other | Admitting: Family Medicine

## 2017-09-13 ENCOUNTER — Encounter: Payer: Self-pay | Admitting: Family Medicine

## 2017-09-19 ENCOUNTER — Encounter: Payer: Self-pay | Admitting: Family Medicine

## 2017-09-19 ENCOUNTER — Ambulatory Visit (INDEPENDENT_AMBULATORY_CARE_PROVIDER_SITE_OTHER): Payer: Medicaid Other | Admitting: Family Medicine

## 2017-09-19 ENCOUNTER — Other Ambulatory Visit: Payer: Self-pay

## 2017-09-19 VITALS — BP 108/76 | HR 90 | Temp 98.4°F | Resp 14 | Ht 64.0 in | Wt 204.8 lb

## 2017-09-19 DIAGNOSIS — M67911 Unspecified disorder of synovium and tendon, right shoulder: Secondary | ICD-10-CM | POA: Diagnosis not present

## 2017-09-19 DIAGNOSIS — F419 Anxiety disorder, unspecified: Secondary | ICD-10-CM

## 2017-09-19 MED ORDER — FLUOXETINE HCL 20 MG/5ML PO SOLN: 20.0000 mg | Freq: Every day | ORAL | 0 refills | Status: DC

## 2017-09-19 NOTE — Progress Notes (Signed)
Patient ID: [name], female    DOB: 1980-09-23, 37 y.o.   MRN: [id number]  Chief Complaint  Patient presents with  . Medication Management    patient is  not taking Prozac, can't swallow it    Allergies Patient has no known allergies.  Subjective:   [name] is a 37 y.o. female who presents to [facility] today.  HPI Here for follow up. Has not been able to take the prozac b/c cannot swallow it. Has never been able to swallow pills b/c gets stressed over it. Still feels anxious and stressed. Feels down and depressed during menses worse than at other times.  She reports that her energy level is a bit better.  She has lost some weight.  She reports that she has been very busy and somewhat stressed because her daughter has been sick.  Her appetite is good.  She did tell me the last visit that she was going to try and swallow the Prozac.  However she reports that she just cannot do it.  She would like a liquid medication.  She reports that she is able to swallow food and food does not get hung up in her throat.  She denies any abdominal pain.  No reflux.  She reports that she has not had the mammogram performed.  She tells me today that the breast pain is a little bit better.  Reports that it is worse during her menses.  Denies any nipple discharge.  She did not go get the x-ray for her shoulder.  She did take the anti-inflammatory for her shoulder but it did not seem to really help.  She reports that her shoulder is still hurting on the right side. Worse if uses the shoulder a lot.  Reports that she has pain when she lifts her arm.  Some occasional tingling down her arm.   Past Medical History:  Diagnosis Date  . Anemia   . Anxiety "fast hearbeat"   took Benadryl at end of pregnancy  . Back pain affecting pregnancy in first trimester 05/27/2015  . Complication of anesthesia    Epidural 1 sided  . Depression    During 1 pregnancy  . Diabetes mellitus without  complication (HCC)   . Gestational diabetes    just started checking BS 03/05/12  . Headache(784.0)   . Hematuria 05/27/2015  . Irregular periods   . Nausea 01/27/2015  . Pregnancy induced hypertension   . Pregnant 01/27/2015  . Recurrent upper respiratory infection (URI)    Mostly when pregnant  . Spotting affecting pregnancy in first trimester 01/27/2015    Past Surgical History:  Procedure Laterality Date  . MULTIPLE TOOTH EXTRACTIONS    . WISDOM TOOTH EXTRACTION      Family History  Problem Relation Age of Onset  . Heart disease Maternal Grandmother   . Cancer Maternal Grandmother        brain  . Heart failure Maternal Grandmother   . Breast cancer Maternal Grandmother   . Cancer Paternal Grandmother        brain  . Hypertension Mother   . Anxiety disorder Mother   . Depression Mother   . Hypertension Father   . Anxiety disorder Sister   . ADD / ADHD Daughter   . Asthma Daughter   . ADD / ADHD Daughter   . Seizures Daughter   . ADD / ADHD Daughter   . ADD / ADHD Daughter      Social  History   Socioeconomic History  . Marital status: Divorced    Spouse name: Not on file  . Number of children: Not on file  . Years of education: Not on file  . Highest education level: Not on file  Occupational History  . Not on file  Social Needs  . Financial resource strain: Not on file  . Food insecurity:    Worry: Not on file    Inability: Not on file  . Transportation needs:    Medical: Not on file    Non-medical: Not on file  Tobacco Use  . Smoking status: Former Smoker    Packs/day: 0.10    Years: 0.50    Pack years: 0.05    Types: Cigarettes    Start date: 02/19/1998    Last attempt to quit: 09/17/1998    Years since quitting: 19.0  . Smokeless tobacco: Never Used  Substance and Sexual Activity  . Alcohol use: No    Alcohol/week: 0.0 oz    Comment: not while pregnant  . Drug use: No  . Sexual activity: Yes    Partners: Male    Birth  control/protection: None  Lifestyle  . Physical activity:    Days per week: Not on file    Minutes per session: Not on file  . Stress: Not on file  Relationships  . Social connections:    Talks on phone: Not on file    Gets together: Not on file    Attends religious service: Not on file    Active member of club or organization: Not on file    Attends meetings of clubs or organizations: Not on file    Relationship status: Not on file  Other Topics Concern  . Not on file  Social History Narrative   [place]   Does not work outside home.   Married for over 3 years.    Wear seatbelt.   Eats all food groups.   Enjoys dance.     Review of Systems  Constitutional: Negative for activity change, appetite change, diaphoresis, fatigue and fever.  Eyes: Negative for visual disturbance.  Respiratory: Negative for cough, chest tightness and shortness of breath.   Cardiovascular: Negative for chest pain, palpitations and leg swelling.  Gastrointestinal: Negative for abdominal pain, nausea and vomiting.  Genitourinary: Negative for dysuria, frequency and urgency.  Skin: Negative for rash.  Neurological: Negative for dizziness, syncope and light-headedness.  Hematological: Negative for adenopathy.  Psychiatric/Behavioral: Positive for agitation and dysphoric mood. Negative for behavioral problems, confusion, decreased concentration, hallucinations, self-injury, sleep disturbance and suicidal ideas. The patient is nervous/anxious. The patient is not hyperactive.      Objective:   BP 108/76 (BP Location: Left Arm, Patient Position: Sitting, Cuff Size: Large)   Pulse 90   Temp 98.4 F (36.9 C) (Temporal)   Resp 14   Ht 5\' 4"  (1.626 m)   Wt 204 lb 12.8 oz (92.9 kg)   SpO2 95%   BMI 35.15 kg/m   Physical Exam  Constitutional: She is oriented to person, place, and time. She appears well-developed and well-nourished. No distress.  HENT:  Head: Normocephalic and  atraumatic.  Eyes: Pupils are equal, round, and reactive to light. Conjunctivae are normal.  Cardiovascular: Normal rate, regular rhythm and normal heart sounds.  Pulmonary/Chest: Effort normal and breath sounds normal. No respiratory distress.  Musculoskeletal:       Right shoulder: She exhibits decreased range of motion, tenderness and bony tenderness.  Pain  with active and passive movement at the shoulder joint.  Decreased strength with internal and external rotation.  Neurological: She is alert and oriented to person, place, and time. No cranial nerve deficit.  Skin: Skin is warm and dry.  Psychiatric: She has a normal mood and affect. Her behavior is normal. Judgment and thought content normal.  Nursing note and vitals reviewed.   Depression screen [facility] 2/9 09/19/2017 08/08/2017 07/03/2017 07/03/2017 04/30/2017  Decreased Interest 0 0 0 0 0  Down, Depressed, Hopeless 1 1 2  0 0  PHQ - 2 Score 1 1 2  0 0  Altered sleeping 0 0 1 - -  Tired, decreased energy 0 0 3 - -  Feeling bad or failure about yourself  0 1 3 - -  Trouble concentrating 0 1 0 - -  Suicidal thoughts 0 0 1 - -  PHQ-9 Score 1 3 10  - -    Assessment and Plan  1. Rotator cuff disorder, right Will not proceed with the x-ray of the shoulder.  She has not gotten that done.  Instead we will go ahead and refer her to orthopedic.  She is not very keen on taking any medications to improve her shoulder.  Therefore, I believe that orthopedic referral would be the best plan of action. - Ambulatory referral to Orthopedic Surgery  2. Anxiety We will discontinue the Prozac tablets.  Will try Prozac solution.  She was counseled on its use. Suicide risks evaluated and documented in note if present or in the area below.  Patient has protective factors of family and community support.  Patient reports that family believes is behaving rationally. Patient displays problem solving skills.   Patient specifically denies suicide ideation.  Patient has access/information to healthcare contacts if situation or mood changes where patient is a risk to self or others or mood becomes unstable.   During the encounter, the patient had good eye contact and firm handshake regarding safety contract and agreement to seek help if mood worsens and not to harm self.   Patient understands the treatment plan and is in agreement. Agrees to keep follow up and call prior or return to clinic if needed.   - FLUoxetine (PROZAC) 20 MG/5ML solution; Take 5 mLs (20 mg total) by mouth daily.  Dispense: 150 mL; Refill: 0   Mammogram was rescheduled for patient today.  Patient was told to call with any questions or concerns. Return in about 1 month (around 10/17/2017) for follow up. Aliene Beams, MD 09/19/2017

## 2017-09-19 NOTE — Patient Instructions (Signed)
Schedule mammogram for patient.

## 2017-09-24 ENCOUNTER — Ambulatory Visit (HOSPITAL_COMMUNITY)
Admission: RE | Admit: 2017-09-24 | Discharge: 2017-09-24 | Disposition: A | Discharge status: Home / Self Care | Payer: Medicaid Other | Source: Ambulatory Visit | Attending: Family Medicine | Admitting: Family Medicine

## 2017-09-24 ENCOUNTER — Encounter (HOSPITAL_COMMUNITY): Payer: Self-pay

## 2017-09-24 DIAGNOSIS — N644 Mastodynia: Secondary | ICD-10-CM | POA: Insufficient documentation

## 2017-09-25 ENCOUNTER — Telehealth: Payer: Self-pay | Admitting: Licensed Clinical Social Worker

## 2017-09-25 ENCOUNTER — Telehealth: Payer: Self-pay | Admitting: Family Medicine

## 2017-09-25 DIAGNOSIS — F329 Major depressive disorder, single episode, unspecified: Secondary | ICD-10-CM

## 2017-09-25 DIAGNOSIS — F419 Anxiety disorder, unspecified: Principal | ICD-10-CM

## 2017-09-25 NOTE — Telephone Encounter (Signed)
Left a voice mail message

## 2017-09-25 NOTE — Telephone Encounter (Signed)
[name] w/ [facility] left message that patients US of Breast needs auth.

## 2017-09-26 NOTE — Patient Outreach (Signed)
Error in charting.

## 2017-09-27 ENCOUNTER — Telehealth: Payer: Self-pay | Admitting: Clinical

## 2017-09-27 NOTE — Telephone Encounter (Signed)
Patient picked up the phone, but was cooking when the behavioral health intern called. Behavioral health intern told patient that she or someone else would call back later.   [facility]

## 2017-10-03 ENCOUNTER — Ambulatory Visit (INDEPENDENT_AMBULATORY_CARE_PROVIDER_SITE_OTHER): Payer: Medicaid Other | Admitting: Orthopaedic Surgery

## 2017-10-03 ENCOUNTER — Encounter (INDEPENDENT_AMBULATORY_CARE_PROVIDER_SITE_OTHER): Payer: Self-pay | Admitting: Orthopaedic Surgery

## 2017-10-03 ENCOUNTER — Ambulatory Visit (INDEPENDENT_AMBULATORY_CARE_PROVIDER_SITE_OTHER): Payer: Self-pay | Admitting: Orthopaedic Surgery

## 2017-10-03 ENCOUNTER — Ambulatory Visit (INDEPENDENT_AMBULATORY_CARE_PROVIDER_SITE_OTHER): Payer: Self-pay

## 2017-10-03 VITALS — BP 124/83 | HR 84 | Ht 62.0 in | Wt 204.0 lb

## 2017-10-03 DIAGNOSIS — M25511 Pain in right shoulder: Secondary | ICD-10-CM

## 2017-10-03 DIAGNOSIS — G8929 Other chronic pain: Secondary | ICD-10-CM

## 2017-10-03 MED ORDER — METHYLPREDNISOLONE ACETATE 40 MG/ML IJ SUSP
40.0000 mg | INTRAMUSCULAR | Status: AC | PRN
  Administered 2017-10-03: 40 mg via INTRA_ARTICULAR

## 2017-10-03 MED ORDER — LIDOCAINE HCL 1 % IJ SOLN
0.5000 mL | INTRAMUSCULAR | Status: AC | PRN
  Administered 2017-10-03: .5 mL

## 2017-10-03 MED ORDER — BUPIVACAINE HCL 0.25 % IJ SOLN
4.0000 mL | INTRAMUSCULAR | Status: AC | PRN
  Administered 2017-10-03: 4 mL via INTRA_ARTICULAR

## 2017-10-03 NOTE — Progress Notes (Signed)
Office Visit Note orthopedic consultation requested by Dr. Aliene Beamsachel Hagler   Patient: [name]           Date of Birth: 11-24-1980           MRN: [id number] Visit Date: 10/03/2017              Requested by: Aliene BeamsHagler, Rachel, MD [address] [place], [place] [id number] PCP: Aliene BeamsHagler, Rachel, MD   Assessment & Plan: Visit Diagnoses:  1. Chronic right shoulder pain     Plan: Right shoulder subacromial injection performed. We discussed avoiding outstretched and overhead activities.  She tolerated the injection well and will call if she has persistent problems.  Thank you for the opportunity to see her in consultation. Follow-Up Instructions: No follow-ups on file.   Orders:  Orders Placed This Encounter  Procedures  . XR Shoulder Right   No orders of the defined types were placed in this encounter.     Procedures: Large Joint Inj: R subacromial bursa on 10/03/2017 2:46 PM Indications: pain Details: 22 G 1.5 in needle  Arthrogram: No  Medications: 4 mL bupivacaine 0.25 %; 40 mg methylPREDNISolone acetate 40 MG/ML; 0.5 mL lidocaine 1 % Outcome: tolerated well, no immediate complications Procedure, treatment alternatives, risks and benefits explained, specific risks discussed. Consent was given by the patient. Immediately prior to procedure a time out was called to verify the correct patient, procedure, equipment, support staff and site/side marked as required. Patient was prepped and draped in the usual sterile fashion.       Clinical Data: No additional findings.   Subjective: Chief Complaint  Patient presents with  . Right Shoulder - Pain    HPI 37 year old female mother of 6 who has a 337-year-old with persistent problems on her right shoulder since around Christmas she does not recall any specific injury.  She is tried some massage and Tylenol with persistent symptoms.  Patient is here for requested consultation by Dr. Tracie HarrierHagler.  Review of Systems positive for  6 vaginal deliveries.  History of gestational diabetes.  History of chest pain right bundle branch block gestational hypertension, anxiety, hematuria.  Otherwise 14 point review of systems systems negative as it pertains HPI.  She does have anxiety history of cataracts and pneumonia as well as asthma.   Objective: Vital Signs: BP 124/83   Pulse 84   Ht 5\' 2"  (1.575 m)   Wt 204 lb (92.5 kg)   BMI 37.31 kg/m   Physical Exam  Constitutional: She is oriented to person, place, and time. She appears well-developed.  HENT:  Head: Normocephalic.  Right Ear: External ear normal.  Left Ear: External ear normal.  Eyes: Pupils are equal, round, and reactive to light.  Neck: No tracheal deviation present. No thyromegaly present.  Cardiovascular: Normal rate.  Pulmonary/Chest: Effort normal.  Abdominal: Soft.  Neurological: She is alert and oriented to person, place, and time.  Skin: Skin is warm and dry.  Psychiatric: She has a normal mood and affect. Her behavior is normal.    Ortho Exam she has normal gait.  Good cervical range of motion negative Spurling no brachial plexus tenderness.  Positive impingement right shoulder some tenderness over the long head of the biceps tendon.  Negative drop arm test but it reproduces her pain on the right negative on the left.  No atrophy epic extremities no rash over exposed skin no lower extremity hyperreflexia.  No pain with cervical compression.  Specialty Comments:  No  specialty comments available.  Imaging: No results found.   PMFS History: Patient Active Problem List   Diagnosis Date Noted  . Gestational hypertension 01/23/2016  . Transverse fetal lie 01/05/2016  . Chest pain w/ Right bundle branch block 08/08/2015  . Poor dentition 07/19/2015  . Supervision of normal pregnancy 06/21/2015  . History of gestational diabetes in prior pregnancy, currently pregnant 06/21/2015  . History of gestational hypertension 06/21/2015  . AMA (advanced  maternal age) multigravida 35+ 06/21/2015  . Grand multipara 06/21/2015  . Rh negative state in antepartum period 06/21/2015  . Hematuria 05/27/2015  . Anxiety 03/05/2012   Past Medical History:  Diagnosis Date  . Anemia   . Anxiety "fast hearbeat"   took Benadryl at end of pregnancy  . Back pain affecting pregnancy in first trimester 05/27/2015  . Complication of anesthesia    Epidural 1 sided  . Depression    During 1 pregnancy  . Diabetes mellitus without complication (HCC)   . Gestational diabetes    just started checking BS 03/05/12  . Headache(784.0)   . Hematuria 05/27/2015  . Irregular periods   . Nausea 01/27/2015  . Pregnancy induced hypertension   . Pregnant 01/27/2015  . Recurrent upper respiratory infection (URI)    Mostly when pregnant  . Spotting affecting pregnancy in first trimester 01/27/2015    Family History  Problem Relation Age of Onset  . Heart disease Maternal Grandmother   . Cancer Maternal Grandmother        brain  . Heart failure Maternal Grandmother   . Breast cancer Maternal Grandmother   . Cancer Paternal Grandmother        brain  . Hypertension Mother   . Anxiety disorder Mother   . Depression Mother   . Hypertension Father   . Anxiety disorder Sister   . ADD / ADHD Daughter   . Asthma Daughter   . ADD / ADHD Daughter   . Seizures Daughter   . ADD / ADHD Daughter   . ADD / ADHD Daughter     Past Surgical History:  Procedure Laterality Date  . MULTIPLE TOOTH EXTRACTIONS    . WISDOM TOOTH EXTRACTION     Social History   Occupational History  . Not on file  Tobacco Use  . Smoking status: Former Smoker    Packs/day: 0.10    Years: 0.50    Pack years: 0.05    Types: Cigarettes    Start date: 02/19/1998    Last attempt to quit: 09/17/1998    Years since quitting: 19.0  . Smokeless tobacco: Never Used  Substance and Sexual Activity  . Alcohol use: No    Alcohol/week: 0.0 oz    Comment: not while pregnant  . Drug use: No  .  Sexual activity: Yes    Partners: Male    Birth control/protection: None

## 2017-10-14 ENCOUNTER — Telehealth: Payer: Self-pay

## 2017-10-14 NOTE — Telephone Encounter (Signed)
Writer left several messages in [facility].  Patient ill be placed on the inactive list.  Writer routed this information to the PCP and Dr. Vanetta ShawlHIsada.

## 2017-10-22 ENCOUNTER — Other Ambulatory Visit: Payer: Self-pay | Admitting: Family Medicine

## 2017-10-22 DIAGNOSIS — F419 Anxiety disorder, unspecified: Secondary | ICD-10-CM

## 2017-10-24 NOTE — Progress Notes (Signed)
This [facility] specialist left message to call back with name and contact information. 

## 2017-11-01 ENCOUNTER — Ambulatory Visit (INDEPENDENT_AMBULATORY_CARE_PROVIDER_SITE_OTHER): Payer: Medicaid Other | Admitting: Family Medicine

## 2017-11-01 ENCOUNTER — Encounter: Payer: Self-pay | Admitting: Family Medicine

## 2017-11-01 ENCOUNTER — Other Ambulatory Visit: Payer: Self-pay

## 2017-11-01 VITALS — BP 106/68 | HR 92 | Temp 97.9°F | Resp 18 | Ht 62.0 in | Wt 200.1 lb

## 2017-11-01 DIAGNOSIS — F32A Depression, unspecified: Secondary | ICD-10-CM

## 2017-11-01 DIAGNOSIS — H539 Unspecified visual disturbance: Secondary | ICD-10-CM | POA: Diagnosis not present

## 2017-11-01 DIAGNOSIS — H269 Unspecified cataract: Secondary | ICD-10-CM | POA: Diagnosis not present

## 2017-11-01 DIAGNOSIS — E785 Hyperlipidemia, unspecified: Secondary | ICD-10-CM

## 2017-11-01 DIAGNOSIS — F419 Anxiety disorder, unspecified: Secondary | ICD-10-CM

## 2017-11-01 DIAGNOSIS — R7989 Other specified abnormal findings of blood chemistry: Secondary | ICD-10-CM

## 2017-11-01 DIAGNOSIS — F329 Major depressive disorder, single episode, unspecified: Secondary | ICD-10-CM

## 2017-11-01 DIAGNOSIS — N644 Mastodynia: Secondary | ICD-10-CM

## 2017-11-01 LAB — COMPLETE METABOLIC PANEL WITH GFR
AG RATIO: 1.7 (calc) (ref 1.0–2.5)
ALKALINE PHOSPHATASE (APISO): 47 U/L (ref 33–115)
ALT: 32 U/L — ABNORMAL HIGH (ref 6–29)
AST: 21 U/L (ref 10–30)
Albumin: 4.8 g/dL (ref 3.6–5.1)
BILIRUBIN TOTAL: 0.4 mg/dL (ref 0.2–1.2)
BUN: 11 mg/dL (ref 7–25)
CALCIUM: 10.2 mg/dL (ref 8.6–10.2)
CHLORIDE: 101 mmol/L (ref 98–110)
CO2: 29 mmol/L (ref 20–32)
Creat: 0.66 mg/dL (ref 0.50–1.10)
GFR, Est African American: 131 mL/min/{1.73_m2} (ref >=60)
GFR, Est Non African American: 113 mL/min/{1.73_m2} (ref >=60)
Globulin: 2.9 g/dL (calc) (ref 1.9–3.7)
Glucose, Bld: 93 mg/dL (ref 65–139)
POTASSIUM: 4.8 mmol/L (ref 3.5–5.3)
Sodium: 138 mmol/L (ref 135–146)
Total Protein: 7.7 g/dL (ref 6.1–8.1)

## 2017-11-01 LAB — LIPID PANEL
CHOLESTEROL: 201 mg/dL — AB (ref <200)
HDL: 59 mg/dL (ref >50)
LDL Cholesterol (Calc): 115 mg/dL (calc) — ABNORMAL HIGH
Non-HDL Cholesterol (Calc): 142 mg/dL (calc) — ABNORMAL HIGH (ref <130)
TRIGLYCERIDES: 153 mg/dL — AB (ref <150)
Total CHOL/HDL Ratio: 3.4 (calc) (ref <5.0)

## 2017-11-01 NOTE — Progress Notes (Signed)
Patient ID: [name], female    DOB: Aug 15, 1980, 37 y.o.   MRN: [id number]  Chief Complaint  Patient presents with  . Medication Management  . mammogram results    Allergies Patient has no known allergies.  Subjective:   [name] is a 37 y.o. female who presents to [facility] today.  HPI Here for follow-up.  Reports that she has had the mammogram and ultrasound performed.  Reports that since she has had this done she has not had any subsequent breast pain.  Reports she has not felt any lumps or bumps in her breast.  She has not had any pain.  Denies any nipple discharge.  No chest pain or chest wall pain.  Denies any shortness of breath or palpitations. She reports that she was also seen by orthopedics for her right sided shoulder pain and it is much better after receiving a steroid injection.  She reports that overall she is doing much better.  She is very happy with the increased dose of the fluoxetine.  She reports that with the increased dose that she feels less stressed and anxious. Sleeping well at night. Appetite good. More motivated to exercise and eat better. Energy good. No side effects. Does not feel sad or down. Does not feel as stressed.  Has lost 4 pounds since her last visit.  Denies any suicidal or homicidal ideation.  No side effects with increased dose of Prozac.  She reports that she would like to get her cholesterol checked.  She is not sure but believes she has high cholesterol.  Has a very strong family history of high cholesterol.  Reports that both of her parents have had issues with this.  She does not smoke.  Reports she tries to eat fairly healthy.  Denies tobacco, alcohol, or drug use.  Also reports that she has had some cramps in her feet on and off for several times.  Is unsure if this is due to the fact that she wears flip-flops a lot.  Reports that she has had a low calcium with her pregnancy.  Would like to have this checked.  Has not had  any swelling in her lower extremities.  No muscle aches or pains otherwise.  Strength is good per her report in her extremities.  No numbness or tingling in extremities. Patient also reports that she needs to follow-up with eye doctor.  She reports that she was diagnosed with cataracts but was told several years ago that they were not to the point where she needed surgery yet.  She reports her vision is not as good as it used to be despite wearing her glasses.  She reports that she is due to get this checked.  She does need a referral for Medicaid.  She reports that she occasionally has had some migraines with associated vision changes but also her vision is not good.  She had migraine headaches which started back before she was pregnant.  She denies any current headache.  She reports they occur very rarely but reports that also her vision is overall not as good as it used to be.  She does wear glasses on a daily basis.  She does not take any medications for migraines.   Past Medical History:  Diagnosis Date  . Anemia   . Anxiety "fast hearbeat"   took Benadryl at end of pregnancy  . Back pain affecting pregnancy in first trimester 05/27/2015  . Complication of anesthesia  Epidural 1 sided  . Depression    During 1 pregnancy  . Diabetes mellitus without complication (HCC)   . Gestational diabetes    just started checking BS 03/05/12  . Headache(784.0)   . Hematuria 05/27/2015  . Irregular periods   . Nausea 01/27/2015  . Pregnancy induced hypertension   . Pregnant 01/27/2015  . Recurrent upper respiratory infection (URI)    Mostly when pregnant  . Spotting affecting pregnancy in first trimester 01/27/2015    Past Surgical History:  Procedure Laterality Date  . MULTIPLE TOOTH EXTRACTIONS    . WISDOM TOOTH EXTRACTION      Family History  Problem Relation Age of Onset  . Heart disease Maternal Grandmother   . Cancer Maternal Grandmother        brain  . Heart failure Maternal  Grandmother   . Breast cancer Maternal Grandmother   . Cancer Paternal Grandmother        brain  . Hypertension Mother   . Anxiety disorder Mother   . Depression Mother   . Hypertension Father   . Anxiety disorder Sister   . ADD / ADHD Daughter   . Asthma Daughter   . ADD / ADHD Daughter   . Seizures Daughter   . ADD / ADHD Daughter   . ADD / ADHD Daughter      Social History   Socioeconomic History  . Marital status: Divorced    Spouse name: Not on file  . Number of children: Not on file  . Years of education: Not on file  . Highest education level: Not on file  Occupational History  . Not on file  Social Needs  . Financial resource strain: Not on file  . Food insecurity:    Worry: Not on file    Inability: Not on file  . Transportation needs:    Medical: Not on file    Non-medical: Not on file  Tobacco Use  . Smoking status: Former Smoker    Packs/day: 0.10    Years: 0.50    Pack years: 0.05    Types: Cigarettes    Start date: 02/19/1998    Last attempt to quit: 09/17/1998    Years since quitting: 19.1  . Smokeless tobacco: Never Used  Substance and Sexual Activity  . Alcohol use: No    Alcohol/week: 0.0 oz    Comment: not while pregnant  . Drug use: No  . Sexual activity: Yes    Partners: Male    Birth control/protection: None  Lifestyle  . Physical activity:    Days per week: Not on file    Minutes per session: Not on file  . Stress: Not on file  Relationships  . Social connections:    Talks on phone: Not on file    Gets together: Not on file    Attends religious service: Not on file    Active member of club or organization: Not on file    Attends meetings of clubs or organizations: Not on file    Relationship status: Not on file  Other Topics Concern  . Not on file  Social History Narrative   Lives in [place], [place]   Does not work outside home.   Married for over 3 years.    Wear seatbelt.   Eats all food groups.   Enjoys dance.     Current Outpatient Medications on File Prior to Visit  Medication Sig Dispense Refill  . FLUoxetine (PROZAC) 20 MG/5ML solution TAKE 5  ML BY MOUTH DAILY. 150 mL 0  . fluticasone (FLONASE) 50 MCG/ACT nasal spray Place 1 spray into both nostrils daily as needed for rhinitis.     Marland Kitchen. loratadine (CLARITIN) 10 MG tablet Take 10 mg by mouth at bedtime as needed for allergies.      No current facility-administered medications on file prior to visit.     Review of Systems  Constitutional: Negative for activity change, appetite change and fever.  Eyes: Negative for visual disturbance.  Respiratory: Negative for cough, chest tightness and shortness of breath.   Cardiovascular: Negative for chest pain, palpitations and leg swelling.  Gastrointestinal: Negative for abdominal pain, nausea and vomiting.  Genitourinary: Negative for dysuria, frequency and urgency.  Neurological: Negative for dizziness, syncope and light-headedness.  Hematological: Negative for adenopathy.     Objective:   BP 106/68 (BP Location: Left Arm, Patient Position: Sitting, Cuff Size: Large)   Pulse 92   Temp 97.9 F (36.6 C) (Temporal)   Resp 18   Ht 5\' 2"  (1.575 m)   Wt 200 lb 1.9 oz (90.8 kg)   SpO2 95%   BMI 36.60 kg/m   Physical Exam  Constitutional: She is oriented to person, place, and time. She appears well-developed and well-nourished.  HENT:  Head: Normocephalic and atraumatic.  Eyes: Pupils are equal, round, and reactive to light. Conjunctivae and EOM are normal. No scleral icterus.  Cardiovascular: Normal rate, regular rhythm, normal heart sounds and intact distal pulses.  Pulmonary/Chest: Effort normal and breath sounds normal. No respiratory distress. She has no wheezes.  Musculoskeletal: Normal range of motion. She exhibits no edema.  Neurological: She is alert and oriented to person, place, and time. No cranial nerve deficit. Coordination normal.  Skin: Skin is warm and dry. Capillary refill  takes less than 2 seconds.  Psychiatric: She has a normal mood and affect. Her behavior is normal. Judgment and thought content normal.  Patient smiling and more talkative during exam.  Obviously brighter and lighter mood.  Thought process is goal-directed without delusions, phobias, obsessions, or compulsions.  No suicidal or homicidal ideations.  Vitals reviewed.  [facility] 11/01/2017 09/19/2017 08/08/2017 07/03/2017 07/03/2017  Decreased Interest 0 0 0 0 0  Down, Depressed, Hopeless 0 1 1 2  0  PHQ - 2 Score 0 1 1 2  0  Altered sleeping 0 0 0 1 -  Tired, decreased energy 0 0 0 3 -  Feeling bad or failure about yourself  1 0 1 3 -  Trouble concentrating 0 0 1 0 -  Suicidal thoughts 0 0 0 1 -  PHQ-9 Score 1 1 3 10  -    Assessment and Plan  1. Hyperlipidemia, unspecified hyperlipidemia type Lifestyle modifications discussed with patient including a diet emphasizing vegetables, fruits, and whole grains. Limiting intake of sodium to less than 2,400 mg per day.  Recommendations discussed include consuming low-fat dairy products, poultry, fish, legumes, non-tropical vegetable oils, and nuts; and limiting intake of sweets, sugar-sweetened beverages, and red meat. Discussed following a plan such as the Dietary Approaches to Stop Hypertension (DASH) diet. Patient to read up on this diet.  The patient is asked to make an attempt to improve diet and exercise patterns to aid in medical management of this problem.  - Lipid panel  2. Low serum calcium Check levels.  Uncertain of secondary to sporadic cramps in her feet.  We did discuss shoe choices today and discouraged patient from only wearing flip-flops. - COMPLETE METABOLIC PANEL  WITH GFR  3. Vision changes Patient has had vision changes which have occurred over time with no acute loss or changes in vision.  She also has a known diagnosis of cataracts.  Will place referral to ophthalmology.  She was counseled concerning worrisome signs  of vision and headaches and if those occur to contact medical help.  She voiced understanding. - Ambulatory referral to Ophthalmology  4. Cataract of both eyes, unspecified cataract type - Ambulatory referral to Ophthalmology  5. Anxiety and depression Improved.  Continue fluoxetine 40 mg p.o. daily as directed.  Call with any questions or concerns.  Diet, exercise, and relaxation/stress reduction techniques recommended and discussed again.  She has not followed up with virtual behavioral health/therapy.  She is not sure she wants to at this time since she is doing well on medication.  She will consider and discuss this at the call. Suicide risks evaluated and documented in note if present or in the area below.  Patient does not have/denies the following risks: previous suicide attempts, family history of suicide, access to lethal means, prior history of psychiatric disorder, history of alcohol or substance abuse disorder, recent loss of a loved one, or severe hopelessness. Patient denies access to firearms or if present will have removed from home/access.   Patient specifically denies suicide ideation. Patient has access/information to healthcare contacts if situation or mood changes where patient is a risk to self or others or mood becomes unstable.   During the encounter, the patient had good eye contact and firm handshake regarding safety contract and agreement to seek help if mood worsens and not to harm self.   Patient understands the treatment plan and is in agreement. Agrees to keep follow up and call prior or return to clinic if needed.    6. Breast pain Resolved.  Breast imaging studies reviewed and discussed with patient.  If the symptoms recur if she has any new problems she will follow-up.  She was counseled regarding routine screening for breast cancer.  In addition she was told to continue to decrease caffeine intake.  She was counseled regarding worrisome signs and symptoms of  breast problems and if they develop she will call or return to clinic.  She voiced understanding.  Return in about 2 months (around 01/02/2018) for follow up. Aliene Beams, MD 11/01/2017

## 2017-11-11 ENCOUNTER — Encounter: Payer: Self-pay | Admitting: Family Medicine

## 2017-11-28 ENCOUNTER — Other Ambulatory Visit: Payer: Self-pay | Admitting: Family Medicine

## 2017-11-28 DIAGNOSIS — F419 Anxiety disorder, unspecified: Secondary | ICD-10-CM

## 2017-12-31 ENCOUNTER — Ambulatory Visit (INDEPENDENT_AMBULATORY_CARE_PROVIDER_SITE_OTHER): Payer: Medicaid Other | Admitting: Family Medicine

## 2017-12-31 ENCOUNTER — Telehealth: Payer: Self-pay | Admitting: Family Medicine

## 2017-12-31 ENCOUNTER — Other Ambulatory Visit: Payer: Self-pay

## 2017-12-31 ENCOUNTER — Encounter: Payer: Self-pay | Admitting: Family Medicine

## 2017-12-31 VITALS — BP 112/68 | HR 96 | Temp 98.7°F | Resp 12 | Ht 63.0 in | Wt 201.4 lb

## 2017-12-31 DIAGNOSIS — F419 Anxiety disorder, unspecified: Secondary | ICD-10-CM | POA: Diagnosis not present

## 2017-12-31 DIAGNOSIS — Z23 Encounter for immunization: Secondary | ICD-10-CM

## 2017-12-31 MED ORDER — FLUOXETINE HCL 20 MG/5ML PO SOLN: 20.0000 mg | Freq: Every day | ORAL | 6 refills | Status: DC

## 2017-12-31 NOTE — Telephone Encounter (Signed)
Left patient detailed message about scheduled US of thyroid thur 9/26 at 2:15

## 2017-12-31 NOTE — Progress Notes (Signed)
Patient ID: [name], female    DOB: 1980-10-30, 37 y.o.   MRN: [id number]  Chief Complaint  Patient presents with  . Anxiety    follow up    Allergies Patient has no known allergies.  Subjective:   [name] is a 37 y.o. female who presents to [facility] today.  HPI [name] presents for follow-up today.  She reports that since she has been taking the Prozac on a daily basis she feels much better.  She has been able to be consistent with her dosing because she has the liquid formulation of the medication.  She reports her mood is improved.  She does not feel anxious or stressed.  She does not feel down, depressed, or hopeless.  She denies any suicidal or homicidal ideation.  She reports that she is not as irritable with her family members.  She is sleeping better.  Is more motivated and has more interest to be engaged with others and exercise.  Enjoys dancing.  Does not have any side effects with medication.  Denies any nausea, vomiting, diarrhea.  Is happy with medication and would like to get it refilled.  Reports since seeing orthopedics her shoulder has improved.  Has not had any issues with her breast or pain.  Feels much better.  Is trying to exercise.  Appetite is more controlled.  Is overall happy with the improvements that she has made in her health.  Would like to get a flu shot today.   Past Medical History:  Diagnosis Date  . Anemia   . Anxiety "fast hearbeat"   took Benadryl at end of pregnancy  . Back pain affecting pregnancy in first trimester 05/27/2015  . Complication of anesthesia    Epidural 1 sided  . Depression    During 1 pregnancy  . Diabetes mellitus without complication (HCC)   . Gestational diabetes    just started checking BS 03/05/12  . Headache(784.0)   . Hematuria 05/27/2015  . Irregular periods   . Nausea 01/27/2015  . Pregnancy induced hypertension   . Pregnant 01/27/2015  . Recurrent upper respiratory infection (URI)    Mostly when pregnant  . Spotting affecting pregnancy in first trimester 01/27/2015    Past Surgical History:  Procedure Laterality Date  . MULTIPLE TOOTH EXTRACTIONS    . WISDOM TOOTH EXTRACTION      Family History  Problem Relation Age of Onset  . Heart disease Maternal Grandmother   . Cancer Maternal Grandmother        brain  . Heart failure Maternal Grandmother   . Breast cancer Maternal Grandmother   . Cancer Paternal Grandmother        brain  . Hypertension Mother   . Anxiety disorder Mother   . Depression Mother   . Hypertension Father   . Anxiety disorder Sister   . ADD / ADHD Daughter   . Asthma Daughter   . ADD / ADHD Daughter   . Seizures Daughter   . ADD / ADHD Daughter   . ADD / ADHD Daughter      Social History   Socioeconomic History  . Marital status: Divorced    Spouse name: Not on file  . Number of children: Not on file  . Years of education: Not on file  . Highest education level: Not on file  Occupational History  . Not on file  Social Needs  . Financial resource strain: Not on file  . Food insecurity:  Worry: Not on file    Inability: Not on file  . Transportation needs:    Medical: Not on file    Non-medical: Not on file  Tobacco Use  . Smoking status: Former Smoker    Packs/day: 0.10    Years: 0.50    Pack years: 0.05    Types: Cigarettes    Start date: 02/19/1998    Last attempt to quit: 09/17/1998    Years since quitting: 19.3  . Smokeless tobacco: Never Used  Substance and Sexual Activity  . Alcohol use: No    Alcohol/week: 0.0 standard drinks    Comment: not while pregnant  . Drug use: No  . Sexual activity: Yes    Partners: Male    Birth control/protection: None  Lifestyle  . Physical activity:    Days per week: Not on file    Minutes per session: Not on file  . Stress: Not on file  Relationships  . Social connections:    Talks on phone: Not on file    Gets together: Not on file    Attends religious service: Not  on file    Active member of club or organization: Not on file    Attends meetings of clubs or organizations: Not on file    Relationship status: Not on file  Other Topics Concern  . Not on file  Social History Narrative   Lives in [place], [place]   Does not work outside home.   Married for over 3 years.    Wear seatbelt.   Eats all food groups.   Enjoys dance.    Current Outpatient Medications on File Prior to Visit  Medication Sig Dispense Refill  . fluticasone (FLONASE) 50 MCG/ACT nasal spray Place 1 spray into both nostrils daily as needed for rhinitis.     Marland Kitchen loratadine (CLARITIN) 10 MG tablet Take 10 mg by mouth at bedtime as needed for allergies.      No current facility-administered medications on file prior to visit.     Review of Systems  Constitutional: Negative for activity change, appetite change and fever.  Eyes: Negative for visual disturbance.  Respiratory: Negative for cough, chest tightness and shortness of breath.   Cardiovascular: Negative for chest pain, palpitations and leg swelling.  Gastrointestinal: Negative for abdominal pain, nausea and vomiting.  Genitourinary: Negative for dysuria, frequency and urgency.  Neurological: Negative for dizziness, syncope and light-headedness.  Hematological: Negative for adenopathy.  Psychiatric/Behavioral: Negative for agitation, behavioral problems, confusion, decreased concentration, dysphoric mood, hallucinations, self-injury, sleep disturbance and suicidal ideas. The patient is not nervous/anxious.      Objective:   BP 112/68 (BP Location: Right Arm, Patient Position: Sitting, Cuff Size: Large)   Pulse 96   Temp 98.7 F (37.1 C) (Temporal)   Resp 12   Ht 5\' 3"  (1.6 m)   Wt 201 lb 6.4 oz (91.4 kg)   SpO2 97% Comment: room air  BMI 35.68 kg/m   Physical Exam  Constitutional: She is oriented to person, place, and time. She appears well-developed and well-nourished. No distress.  HENT:  Head: Normocephalic and  atraumatic.  Eyes: Pupils are equal, round, and reactive to light.  Neck: Normal range of motion. Neck supple. No thyromegaly present.  Cardiovascular: Normal rate, regular rhythm and normal heart sounds.  Pulmonary/Chest: Effort normal and breath sounds normal. No respiratory distress.  Neurological: She is alert and oriented to person, place, and time. No cranial nerve deficit.  Skin: Skin is warm and dry.  Psychiatric: She has a normal mood and affect. Her speech is normal and behavior is normal. Judgment and thought content normal. Cognition and memory are normal. She expresses no homicidal and no suicidal ideation. She expresses no suicidal plans and no homicidal plans.  Nursing note and vitals reviewed.  [facility] 12/31/2017 11/01/2017 09/19/2017 08/08/2017 07/03/2017  Decreased Interest 0 0 0 0 0  Down, Depressed, Hopeless 0 0 1 1 2   PHQ - 2 Score 0 0 1 1 2   Altered sleeping 0 0 0 0 1  Tired, decreased energy 1 0 0 0 3  Feeling bad or failure about yourself  0 1 0 1 3  Trouble concentrating 0 0 0 1 0  Suicidal thoughts 0 0 0 0 1  PHQ-9 Score 1 1 1 3 10     Assessment and Plan  1. Anxiety Improved on Prozac.  Continue medication as directed.  Counseled regarding abrupt changes in her mood.  If she develops any mood disorders or has any abrupt changes in her mood, or develops any suicidal or homicidal ideation, she agrees and understands to contact medical help.  Medication was refilled for 6 months.  She will follow-up with new PCP and establish care within the next 3 to 6 months. - FLUoxetine (PROZAC) 20 MG/5ML solution; Take 5 mLs (20 mg total) by mouth daily.  Dispense: 150 mL; Refill: 6  2. Need for immunization against influenza Vaccination given. - Flu Vaccine QUAD 36+ mos IM  Diet, exercise, and healthy lifestyle modifications recommended.  Weight loss was recommended.  Also discussed with patient that exercise helps with stress reduction and mood elevation.  She  voiced understanding. Suicide risks evaluated and documented in note if present or in the area below. Patient counseled in detail regarding the risks of medication. Told to call or return to clinic if develop any worrisome signs or symptoms. Patient voiced understanding.   Patient has protective factors of family and community support.  Patient reports that family believes is behaving rationally. Patient displays problem solving skills.   Patient specifically denies suicide ideation. Patient has access/information to healthcare contacts if situation or mood changes where patient is a risk to self or others or mood becomes unstable.   During the encounter, the patient had good eye contact and firm handshake regarding safety contract and agreement to seek help if mood worsens and not to harm self.   Patient understands the treatment plan and is in agreement. Agrees to keep follow up and call prior or return to clinic if needed.   No follow-ups on file. Aliene Beamsachel Klye Besecker, MD 12/31/2017

## 2019-02-10 LAB — CYTOLOGY - PAP: Pap Smear: NEGATIVE

## 2019-03-07 ENCOUNTER — Emergency Department (HOSPITAL_COMMUNITY)
Admission: EM | Admit: 2019-03-07 | Discharge: 2019-03-07 | Disposition: A | Discharge status: Home / Self Care | Payer: Medicaid Other | Attending: Emergency Medicine | Admitting: Emergency Medicine

## 2019-03-07 ENCOUNTER — Other Ambulatory Visit: Payer: Self-pay

## 2019-03-07 ENCOUNTER — Encounter (HOSPITAL_COMMUNITY): Payer: Self-pay | Admitting: *Deleted

## 2019-03-07 DIAGNOSIS — Z79899 Other long term (current) drug therapy: Secondary | ICD-10-CM | POA: Diagnosis not present

## 2019-03-07 DIAGNOSIS — K8020 Calculus of gallbladder without cholecystitis without obstruction: Secondary | ICD-10-CM | POA: Diagnosis not present

## 2019-03-07 DIAGNOSIS — E119 Type 2 diabetes mellitus without complications: Secondary | ICD-10-CM | POA: Diagnosis not present

## 2019-03-07 DIAGNOSIS — Z87891 Personal history of nicotine dependence: Secondary | ICD-10-CM | POA: Insufficient documentation

## 2019-03-07 DIAGNOSIS — R1011 Right upper quadrant pain: Secondary | ICD-10-CM | POA: Diagnosis present

## 2019-03-07 HISTORY — DX: Calculus of gallbladder without cholecystitis without obstruction: K80.20

## 2019-03-07 HISTORY — DX: Endometriosis, unspecified: N80.9

## 2019-03-07 LAB — LIPASE, BLOOD: Lipase: 25 U/L (ref 11–51)

## 2019-03-07 LAB — COMPREHENSIVE METABOLIC PANEL
ALT: 25 U/L (ref 0–44)
AST: 20 U/L (ref 15–41)
Albumin: 4.3 g/dL (ref 3.5–5.0)
Alkaline Phosphatase: 41 U/L (ref 38–126)
Anion gap: 9 (ref 5–15)
BUN: 12 mg/dL (ref 6–20)
CO2: 22 mmol/L (ref 22–32)
Calcium: 9.3 mg/dL (ref 8.9–10.3)
Chloride: 104 mmol/L (ref 98–111)
Creatinine, Ser: 0.64 mg/dL (ref 0.44–1.00)
GFR calc Af Amer: >60 mL/min (ref >60)
GFR calc non Af Amer: >60 mL/min (ref >60)
Glucose, Bld: 138 mg/dL — ABNORMAL HIGH (ref 70–99)
Potassium: 3.8 mmol/L (ref 3.5–5.1)
Sodium: 135 mmol/L (ref 135–145)
Total Bilirubin: 0.3 mg/dL (ref 0.3–1.2)
Total Protein: 7.9 g/dL (ref 6.5–8.1)

## 2019-03-07 LAB — CBC WITH DIFFERENTIAL/PLATELET
Abs Immature Granulocytes: 0.19 10*3/uL — ABNORMAL HIGH (ref 0.00–0.07)
Basophils Absolute: 0.1 10*3/uL (ref 0.0–0.1)
Basophils Relative: 0 %
Eosinophils Absolute: 0.1 10*3/uL (ref 0.0–0.5)
Eosinophils Relative: 1 %
HCT: 41.3 % (ref 36.0–46.0)
Hemoglobin: 13.2 g/dL (ref 12.0–15.0)
Immature Granulocytes: 1 %
Lymphocytes Relative: 9 %
Lymphs Abs: 1.4 10*3/uL (ref 0.7–4.0)
MCH: 29.2 pg (ref 26.0–34.0)
MCHC: 32 g/dL (ref 30.0–36.0)
MCV: 91.4 fL (ref 80.0–100.0)
Monocytes Absolute: 0.8 10*3/uL (ref 0.1–1.0)
Monocytes Relative: 5 %
Neutro Abs: 12.5 10*3/uL — ABNORMAL HIGH (ref 1.7–7.7)
Neutrophils Relative %: 84 %
Platelets: 303 10*3/uL (ref 150–400)
RBC: 4.52 MIL/uL (ref 3.87–5.11)
RDW: 13.2 % (ref 11.5–15.5)
WBC: 15 10*3/uL — ABNORMAL HIGH (ref 4.0–10.5)
nRBC: 0 % (ref 0.0–0.2)

## 2019-03-07 LAB — I-STAT BETA HCG BLOOD, ED (MC, WL, AP ONLY): I-stat hCG, quantitative: <5 m[IU]/mL (ref <5)

## 2019-03-07 MED ORDER — ONDANSETRON HCL 4 MG/2ML IJ SOLN
4.0000 mg | Freq: Once | INTRAMUSCULAR | Status: AC
  Administered 2019-03-07: 4 mg via INTRAVENOUS
  Filled 2019-03-07: qty 2

## 2019-03-07 MED ORDER — HYDROCODONE-ACETAMINOPHEN 5-325 MG PO TABS: 1.0000 | ORAL_TABLET | Freq: Four times a day (QID) | ORAL | 0 refills | Status: DC | PRN

## 2019-03-07 MED ORDER — MORPHINE SULFATE (PF) 4 MG/ML IV SOLN
4.0000 mg | Freq: Once | INTRAVENOUS | Status: AC
  Administered 2019-03-07: 4 mg via INTRAVENOUS
  Filled 2019-03-07: qty 1

## 2019-03-07 MED ORDER — SODIUM CHLORIDE 0.9 % IV BOLUS
1000.0000 mL | Freq: Once | INTRAVENOUS | Status: AC
  Administered 2019-03-07: 1000 mL via INTRAVENOUS

## 2019-03-07 NOTE — Discharge Instructions (Addendum)
Hydrocodone as prescribed as needed for pain.  Follow-up with Dr. Arnoldo Morale next week to discuss your gallstones.  Return to the emergency department in the meantime if you develop high fever, worsening pain, or other new and concerning symptoms.

## 2019-03-07 NOTE — ED Triage Notes (Signed)
Pt c/o epigastric abd pain that started this am, recently diagnosed as having gallstones, scheduled to see surgeon next week,

## 2019-03-07 NOTE — ED Notes (Signed)
ED Provider at bedside. 

## 2019-03-07 NOTE — ED Provider Notes (Signed)
[facility] EMERGENCY DEPARTMENT Provider Note   CSN: [id number] Arrival date & time: 03/07/19  0439     History   Chief Complaint Chief Complaint  Patient presents with  . Abdominal Pain    HPI [name] is a 38 y.o. female.     Patient is a 38 year old female with past medical history gestational diabetes and uvula, anxiety, and recent diagnosis of gallstones for which she is awaiting surgical consultation.  Patient presents today with complaints of pain in the epigastric region and right upper quadrant that worsened after eating dinner.  She denies nausea, vomiting, or diarrhea.  She denies fevers or chills.  The history is provided by the patient.  Abdominal Pain Pain location:  Epigastric and RUQ Pain quality: cramping   Pain radiates to:  Does not radiate Pain severity:  Moderate Onset quality:  Sudden Duration:  24 hours Timing:  Constant Progression:  Worsening Chronicity:  Recurrent Relieved by:  Nothing Worsened by:  Eating Ineffective treatments:  None tried   Past Medical History:  Diagnosis Date  . Anemia   . Anxiety "fast hearbeat"   took Benadryl at end of pregnancy  . Back pain affecting pregnancy in first trimester 05/27/2015  . Complication of anesthesia    Epidural 1 sided  . Depression    During 1 pregnancy  . Diabetes mellitus without complication ([place])   . Endometriosis   . Gallstones   . Gestational diabetes    just started checking BS 03/05/12  . Headache(784.0)   . Hematuria 05/27/2015  . Irregular periods   . Nausea 01/27/2015  . Pregnancy induced hypertension   . Pregnant 01/27/2015  . Recurrent upper respiratory infection (URI)    Mostly when pregnant  . Spotting affecting pregnancy in first trimester 01/27/2015    Patient Active Problem List   Diagnosis Date Noted  . Gestational hypertension 01/23/2016  . Transverse fetal lie 01/05/2016  . Chest pain w/ Right bundle branch block 08/08/2015  . Poor dentition  07/19/2015  . Supervision of normal pregnancy 06/21/2015  . History of gestational diabetes in prior pregnancy, currently pregnant 06/21/2015  . History of gestational hypertension 06/21/2015  . AMA (advanced maternal age) multigravida 35+ 06/21/2015  . [place] multipara 06/21/2015  . Rh negative state in antepartum period 06/21/2015  . Hematuria 05/27/2015  . Anxiety 03/05/2012    Past Surgical History:  Procedure Laterality Date  . MULTIPLE TOOTH EXTRACTIONS    . WISDOM TOOTH EXTRACTION       OB History    Gravida  8   Para  6   Term  6   Preterm  0   AB  2   Living  6     SAB  2   TAB      Ectopic      Multiple  0   Live Births  6        Obstetric Comments  Dr. Lawanna Kobus Tree.          Home Medications    Prior to Admission medications   Medication Sig Start Date End Date Taking? Authorizing Provider  FLUoxetine (PROZAC) 20 MG/5ML solution Take 5 mLs (20 mg total) by mouth daily. 12/31/17   Caren Macadam, MD  fluticasone (FLONASE) 50 MCG/ACT nasal spray Place 1 spray into both nostrils daily as needed for rhinitis.     [provider]  loratadine (CLARITIN) 10 MG tablet Take 10 mg by mouth at bedtime as needed for allergies.  [provider]    Family History Family History  Problem Relation Age of Onset  . Heart disease Maternal Grandmother   . Cancer Maternal Grandmother        brain  . Heart failure Maternal Grandmother   . Breast cancer Maternal Grandmother   . Cancer Paternal Grandmother        brain  . Hypertension Mother   . Anxiety disorder Mother   . Depression Mother   . Hypertension Father   . Anxiety disorder Sister   . ADD / ADHD Daughter   . Asthma Daughter   . ADD / ADHD Daughter   . Seizures Daughter   . ADD / ADHD Daughter   . ADD / ADHD Daughter     Social History Social History   Tobacco Use  . Smoking status: Former Smoker    Packs/day: 0.10    Years: 0.50    Pack years: 0.05     Types: Cigarettes    Start date: 02/19/1998    Quit date: 09/17/1998    Years since quitting: 20.4  . Smokeless tobacco: Never Used  Substance Use Topics  . Alcohol use: No    Alcohol/week: 0.0 standard drinks    Comment: not while pregnant  . Drug use: No     Allergies   Patient has no known allergies.   Review of Systems Review of Systems  Gastrointestinal: Positive for abdominal pain.  All other systems reviewed and are negative.    Physical Exam Updated Vital Signs BP (!) 135/97   Pulse 91   Temp 98.9 F (37.2 C) (Oral)   Resp 16   Ht 5' (1.524 m)   Wt 97.1 kg   SpO2 97%   BMI 41.79 kg/m   Physical Exam Vitals signs and nursing note reviewed.  Constitutional:      General: She is not in acute distress.    Appearance: She is well-developed. She is not diaphoretic.  HENT:     Head: Normocephalic and atraumatic.  Neck:     Musculoskeletal: Normal range of motion and neck supple.  Cardiovascular:     Rate and Rhythm: Normal rate and regular rhythm.     Heart sounds: No murmur. No friction rub. No gallop.   Pulmonary:     Effort: Pulmonary effort is normal. No respiratory distress.     Breath sounds: Normal breath sounds. No wheezing.  Abdominal:     General: Bowel sounds are normal. There is no distension.     Palpations: Abdomen is soft.     Tenderness: There is abdominal tenderness in the right upper quadrant and epigastric area. There is no right CVA tenderness, left CVA tenderness, guarding or rebound. Negative signs include Murphy's sign.  Musculoskeletal: Normal range of motion.  Skin:    General: Skin is warm and dry.  Neurological:     Mental Status: She is alert and oriented to person, place, and time.      ED Treatments / Results  Labs (all labs ordered are listed, but only abnormal results are displayed) Labs Reviewed  COMPREHENSIVE METABOLIC PANEL  LIPASE, BLOOD  CBC WITH DIFFERENTIAL/PLATELET  I-STAT BETA HCG BLOOD, ED ([facility], [facility], [facility]  ONLY)    EKG None  Radiology No results found.  Procedures Procedures (including critical care time)  Medications Ordered in ED Medications  sodium chloride 0.9 % bolus 1,000 mL (has no administration in time range)  ondansetron (ZOFRAN) injection 4 mg (has no administration in time range)  morphine 4 MG/ML injection 4 mg (has no administration in time range)     Initial Impression / Assessment and Plan / ED Course  I have reviewed the triage vital signs and the nursing notes.  Pertinent labs & imaging results that were available during my care of the patient were reviewed by me and considered in my medical decision making (see chart for details).  Patient is a 38 year old female with history of known gallstones presenting with complaints of right upper quadrant pain that is worse since eating Thanksgiving dinner.  She denies fevers, chills, or vomiting.  Work-up today shows a white count of 15,000, the significance of which I am uncertain.  Her LFTs and lipase are all normal.  She is afebrile and nontoxic-appearing and I believe as though follow-up with her surgeon as scheduled is appropriate.  I see no indication for admission or emergent surgery.  She will be given pain medicine and advised to follow-up as scheduled with Dr. Lovell Sheehan.  Final Clinical Impressions(s) / ED Diagnoses   Final diagnoses:  None    ED Discharge Orders    None       Geoffery Lyons, MD 03/07/19 [phone]

## 2019-03-12 ENCOUNTER — Other Ambulatory Visit: Payer: Self-pay

## 2019-03-12 ENCOUNTER — Encounter: Payer: Self-pay | Admitting: General Surgery

## 2019-03-12 ENCOUNTER — Ambulatory Visit (INDEPENDENT_AMBULATORY_CARE_PROVIDER_SITE_OTHER): Payer: Medicaid Other | Admitting: General Surgery

## 2019-03-12 VITALS — BP 124/81 | HR 101 | Temp 98.2°F | Resp 14 | Ht 65.0 in | Wt 211.4 lb

## 2019-03-12 DIAGNOSIS — K8020 Calculus of gallbladder without cholecystitis without obstruction: Secondary | ICD-10-CM

## 2019-03-12 NOTE — Patient Instructions (Addendum)
Surgery scheduled 03/20/2019. Pre-op scheduled 03/18/2019 @ 11:30am. Covid Test scheduled 03/18/2019 @ 12:30am.    Laparoscopic Cholecystectomy Laparoscopic cholecystectomy is surgery to remove the gallbladder. The gallbladder is a pear-shaped organ that lies beneath the liver on the right side of the body. The gallbladder stores bile, which is a fluid that helps the body to digest fats. Cholecystectomy is often done for inflammation of the gallbladder (cholecystitis). This condition is usually caused by a buildup of gallstones (cholelithiasis) in the gallbladder. Gallstones can block the flow of bile, which can result in inflammation and pain. In severe cases, emergency surgery may be required. This procedure is done though small incisions in your abdomen (laparoscopic surgery). A thin scope with a camera (laparoscope) is inserted through one incision. Thin surgical instruments are inserted through the other incisions. In some cases, a laparoscopic procedure may be turned into a type of surgery that is done through a larger incision (open surgery). Tell a health care provider about:  Any allergies you have.  All medicines you are taking, including vitamins, herbs, eye drops, creams, and over-the-counter medicines.  Any problems you or family members have had with anesthetic medicines.  Any blood disorders you have.  Any surgeries you have had.  Any medical conditions you have.  Whether you are pregnant or may be pregnant. What are the risks? Generally, this is a safe procedure. However, problems may occur, including:  Infection.  Bleeding.  Allergic reactions to medicines.  Damage to other structures or organs.  A stone remaining in the common bile duct. The common bile duct carries bile from the gallbladder into the small intestine.  A bile leak from the cyst duct that is clipped when your gallbladder is removed. What happens before the procedure? Staying hydrated Follow  instructions from your health care provider about hydration, which may include:  Up to 2 hours before the procedure - you may continue to drink clear liquids, such as water, clear fruit juice, black coffee, and plain tea. Eating and drinking restrictions Follow instructions from your health care provider about eating and drinking, which may include:  8 hours before the procedure - stop eating heavy meals or foods such as meat, fried foods, or fatty foods.  6 hours before the procedure - stop eating light meals or foods, such as toast or cereal.  6 hours before the procedure - stop drinking milk or drinks that contain milk.  2 hours before the procedure - stop drinking clear liquids. Medicines  Ask your health care provider about: ? Changing or stopping your regular medicines. This is especially important if you are taking diabetes medicines or blood thinners. ? Taking medicines such as aspirin and ibuprofen. These medicines can thin your blood. Do not take these medicines before your procedure if your health care provider instructs you not to.  You may be given antibiotic medicine to help prevent infection. General instructions  Let your health care provider know if you develop a cold or an infection before surgery.  Plan to have someone take you home from the hospital or clinic.  Ask your health care provider how your surgical site will be marked or identified. What happens during the procedure?   To reduce your risk of infection: ? Your health care team will wash or sanitize their hands. ? Your skin will be washed with soap. ? Hair may be removed from the surgical area.  An IV tube may be inserted into one of your veins.  You will be  given one or more of the following: ? A medicine to help you relax (sedative). ? A medicine to make you fall asleep (general anesthetic).  A breathing tube will be placed in your mouth.  Your surgeon will make several small cuts (incisions)  in your abdomen.  The laparoscope will be inserted through one of the small incisions. The camera on the laparoscope will send images to a TV screen (monitor) in the operating room. This lets your surgeon see inside your abdomen.  Air-like gas will be pumped into your abdomen. This will expand your abdomen to give the surgeon more room to perform the surgery.  Other tools that are needed for the procedure will be inserted through the other incisions. The gallbladder will be removed through one of the incisions.  Your common bile duct may be examined. If stones are found in the common bile duct, they may be removed.  After your gallbladder has been removed, the incisions will be closed with stitches (sutures), staples, or skin glue.  Your incisions may be covered with a bandage (dressing). The procedure may vary among health care providers and hospitals. What happens after the procedure?  Your blood pressure, heart rate, breathing rate, and blood oxygen level will be monitored until the medicines you were given have worn off.  You will be given medicines as needed to control your pain.  Do not drive for 24 hours if you were given a sedative. This information is not intended to replace advice given to you by your health care provider. Make sure you discuss any questions you have with your health care provider. Document Released: 03/26/2005 Document Revised: 03/08/2017 Document Reviewed: 09/12/2015 Elsevier Patient Education  2020 [employer]    Cholelithiasis  Cholelithiasis is also called "gallstones." It is a kind of gallbladder disease. The gallbladder is an organ that stores a liquid (bile) that helps you digest fat. Gallstones may not cause symptoms (may be silent gallstones) until they cause a blockage, and then they can cause pain (gallbladder attack). Follow these instructions at home:  Take over-the-counter and prescription medicines only as told by your doctor.  Stay at a  healthy weight.  Eat healthy foods. This includes: ? Eating fewer fatty foods, like fried foods. ? Eating fewer refined carbs (refined carbohydrates). Refined carbs are breads and grains that are highly processed, like white bread and white rice. Instead, choose whole grains like whole-wheat bread and brown rice. ? Eating more fiber. Almonds, fresh fruit, and beans are healthy sources of fiber.  Keep all follow-up visits as told by your doctor. This is important. Contact a doctor if:  You have sudden pain in the upper right side of your belly (abdomen). Pain might spread to your right shoulder or your chest. This may be a sign of a gallbladder attack.  You feel sick to your stomach (are nauseous).  You throw up (vomit).  You have been diagnosed with gallstones that have no symptoms and you get: ? Belly pain. ? Discomfort, burning, or fullness in the upper part of your belly (indigestion). Get help right away if:  You have sudden pain in the upper right side of your belly, and it lasts for more than 2 hours.  You have belly pain that lasts for more than 5 hours.  You have a fever or chills.  You keep feeling sick to your stomach or you keep throwing up.  Your skin or the whites of your eyes turn yellow (jaundice).  You have  dark-colored pee (urine).  You have light-colored poop (stool). Summary  Cholelithiasis is also called "gallstones."  The gallbladder is an organ that stores a liquid (bile) that helps you digest fat.  Silent gallstones are gallstones that do not cause symptoms.  A gallbladder attack may cause sudden pain in the upper right side of your belly. Pain might spread to your right shoulder or your chest. If this happens, contact your doctor.  If you have sudden pain in the upper right side of your belly that lasts for more than 2 hours, get help right away. This information is not intended to replace advice given to you by your health care provider. Make sure  you discuss any questions you have with your health care provider. Document Released: 09/12/2007 Document Revised: 03/08/2017 Document Reviewed: 12/11/2015 Elsevier Patient Education  2020 [employer].

## 2019-03-12 NOTE — H&P (Signed)
[name]; [id number]; 1981-03-18   HPI Patient is a 38 year old white female who was referred to my care by Arsenio Katz and Roseanne Kaufman for evaluation treatment of cholelithiasis.  Patient has been having intermittent episodes of right upper quadrant abdominal pain with radiation to the right flank and back, nausea, bloating, and intermittent fatty food intolerance.  She was seen in the emergency room within the past week and was found to have a slightly elevated white blood cell count, but normal LFTs.  She had an outpatient ultrasound which showed cholelithiasis with a normal common bile duct.  Patient states that the pain occur spontaneously and is occurring with more frequency.  No fever, chills, jaundice have been noted.  She currently has 0 out of 10 abdominal pain. Past Medical History:  Diagnosis Date  . Anemia   . Anxiety "fast hearbeat"   took Benadryl at end of pregnancy  . Back pain affecting pregnancy in first trimester 05/27/2015  . Complication of anesthesia    Epidural 1 sided  . Depression    During 1 pregnancy  . Diabetes mellitus without complication ([place])   . Endometriosis   . Gallstones   . Gestational diabetes    just started checking BS 03/05/12  . Headache(784.0)   . Hematuria 05/27/2015  . Irregular periods   . Nausea 01/27/2015  . Pregnancy induced hypertension   . Pregnant 01/27/2015  . Recurrent upper respiratory infection (URI)    Mostly when pregnant  . Spotting affecting pregnancy in first trimester 01/27/2015    Past Surgical History:  Procedure Laterality Date  . MULTIPLE TOOTH EXTRACTIONS    . WISDOM TOOTH EXTRACTION      Family History  Problem Relation Age of Onset  . Heart disease Maternal Grandmother   . Cancer Maternal Grandmother        brain  . Heart failure Maternal Grandmother   . Breast cancer Maternal Grandmother   . Cancer Paternal Grandmother        brain  . Hypertension Mother   . Anxiety disorder Mother   . Depression  Mother   . Hypertension Father   . Anxiety disorder Sister   . ADD / ADHD Daughter   . Asthma Daughter   . ADD / ADHD Daughter   . Seizures Daughter   . ADD / ADHD Daughter   . ADD / ADHD Daughter     Current Outpatient Medications on File Prior to Visit  Medication Sig Dispense Refill  . FLUoxetine (PROZAC) 20 MG/5ML solution Take 5 mLs (20 mg total) by mouth daily. 150 mL 6  . fluticasone (FLONASE) 50 MCG/ACT nasal spray Place 1 spray into both nostrils daily as needed for allergies or rhinitis.     Marland Kitchen HYDROcodone-acetaminophen (NORCO) 5-325 MG tablet Take 1-2 tablets by mouth every 6 (six) hours as needed. 15 tablet 0  . loratadine (CLARITIN) 10 MG tablet Take 10 mg by mouth at bedtime.      No current facility-administered medications on file prior to visit.     Allergies  Allergen Reactions  . Bactrim Ds [Sulfamethoxazole-Trimethoprim] Swelling    Social History   Substance and Sexual Activity  Alcohol Use No  . Alcohol/week: 0.0 standard drinks   Comment: not while pregnant    Social History   Tobacco Use  Smoking Status Former Smoker  . Packs/day: 0.10  . Years: 0.50  . Pack years: 0.05  . Types: Cigarettes  . Start date: 02/19/1998  . Quit date: 09/17/1998  .  Years since quitting: 20.4  Smokeless Tobacco Never Used    Review of Systems  Constitutional: Negative.   HENT: Negative.   Eyes: Negative.   Respiratory: Negative.   Cardiovascular: Negative.   Gastrointestinal: Positive for abdominal pain and heartburn.  Genitourinary: Negative.   Musculoskeletal: Negative.   Skin: Negative.   Neurological: Negative.   Endo/Heme/Allergies: Negative.   Psychiatric/Behavioral: Negative.     Objective   Vitals:   03/12/19 1003  BP: 124/81  Pulse: (!) 101  Resp: 14  Temp: 98.2 F (36.8 C)  SpO2: 96%    Physical Exam Vitals signs reviewed.  Constitutional:      Appearance: Normal appearance. She is not ill-appearing.  HENT:     Head:  Normocephalic and atraumatic.  Eyes:     General: No scleral icterus. Cardiovascular:     Rate and Rhythm: Normal rate and regular rhythm.     Heart sounds: Normal heart sounds. No murmur. No friction rub. No gallop.   Pulmonary:     Effort: Pulmonary effort is normal. No respiratory distress.     Breath sounds: Normal breath sounds. No stridor. No wheezing, rhonchi or rales.  Abdominal:     General: Bowel sounds are normal. There is no distension.     Palpations: Abdomen is soft. There is no mass.     Tenderness: There is abdominal tenderness. There is no guarding or rebound.     Hernia: No hernia is present.     Comments: Slight tenderness to deep palpation in the right upper quadrant.  No rigidity is noted.  Skin:    General: Skin is warm and dry.  Neurological:     Mental Status: She is alert and oriented to person, place, and time.    Primary care and ER notes reviewed Assessment  Biliary colic, cholelithiasis Plan   Patient is scheduled for laparoscopic cholecystectomy on 03/20/2019.  The risks and benefits of the procedure including bleeding, infection, hepatobiliary injury, the possibility of an open procedure were fully explained to the patient, who gave informed consent.

## 2019-03-12 NOTE — Progress Notes (Signed)
[name]; [id number]; 1981-03-18   HPI Patient is a 38 year old white female who was referred to my care by Arsenio Katz and Roseanne Kaufman for evaluation treatment of cholelithiasis.  Patient has been having intermittent episodes of right upper quadrant abdominal pain with radiation to the right flank and back, nausea, bloating, and intermittent fatty food intolerance.  She was seen in the emergency room within the past week and was found to have a slightly elevated white blood cell count, but normal LFTs.  She had an outpatient ultrasound which showed cholelithiasis with a normal common bile duct.  Patient states that the pain occur spontaneously and is occurring with more frequency.  No fever, chills, jaundice have been noted.  She currently has 0 out of 10 abdominal pain. Past Medical History:  Diagnosis Date  . Anemia   . Anxiety "fast hearbeat"   took Benadryl at end of pregnancy  . Back pain affecting pregnancy in first trimester 05/27/2015  . Complication of anesthesia    Epidural 1 sided  . Depression    During 1 pregnancy  . Diabetes mellitus without complication ([place])   . Endometriosis   . Gallstones   . Gestational diabetes    just started checking BS 03/05/12  . Headache(784.0)   . Hematuria 05/27/2015  . Irregular periods   . Nausea 01/27/2015  . Pregnancy induced hypertension   . Pregnant 01/27/2015  . Recurrent upper respiratory infection (URI)    Mostly when pregnant  . Spotting affecting pregnancy in first trimester 01/27/2015    Past Surgical History:  Procedure Laterality Date  . MULTIPLE TOOTH EXTRACTIONS    . WISDOM TOOTH EXTRACTION      Family History  Problem Relation Age of Onset  . Heart disease Maternal Grandmother   . Cancer Maternal Grandmother        brain  . Heart failure Maternal Grandmother   . Breast cancer Maternal Grandmother   . Cancer Paternal Grandmother        brain  . Hypertension Mother   . Anxiety disorder Mother   . Depression  Mother   . Hypertension Father   . Anxiety disorder Sister   . ADD / ADHD Daughter   . Asthma Daughter   . ADD / ADHD Daughter   . Seizures Daughter   . ADD / ADHD Daughter   . ADD / ADHD Daughter     Current Outpatient Medications on File Prior to Visit  Medication Sig Dispense Refill  . FLUoxetine (PROZAC) 20 MG/5ML solution Take 5 mLs (20 mg total) by mouth daily. 150 mL 6  . fluticasone (FLONASE) 50 MCG/ACT nasal spray Place 1 spray into both nostrils daily as needed for allergies or rhinitis.     Marland Kitchen HYDROcodone-acetaminophen (NORCO) 5-325 MG tablet Take 1-2 tablets by mouth every 6 (six) hours as needed. 15 tablet 0  . loratadine (CLARITIN) 10 MG tablet Take 10 mg by mouth at bedtime.      No current facility-administered medications on file prior to visit.     Allergies  Allergen Reactions  . Bactrim Ds [Sulfamethoxazole-Trimethoprim] Swelling    Social History   Substance and Sexual Activity  Alcohol Use No  . Alcohol/week: 0.0 standard drinks   Comment: not while pregnant    Social History   Tobacco Use  Smoking Status Former Smoker  . Packs/day: 0.10  . Years: 0.50  . Pack years: 0.05  . Types: Cigarettes  . Start date: 02/19/1998  . Quit date: 09/17/1998  .  Years since quitting: 20.4  Smokeless Tobacco Never Used    Review of Systems  Constitutional: Negative.   HENT: Negative.   Eyes: Negative.   Respiratory: Negative.   Cardiovascular: Negative.   Gastrointestinal: Positive for abdominal pain and heartburn.  Genitourinary: Negative.   Musculoskeletal: Negative.   Skin: Negative.   Neurological: Negative.   Endo/Heme/Allergies: Negative.   Psychiatric/Behavioral: Negative.     Objective   Vitals:   03/12/19 1003  BP: 124/81  Pulse: (!) 101  Resp: 14  Temp: 98.2 F (36.8 C)  SpO2: 96%    Physical Exam Vitals signs reviewed.  Constitutional:      Appearance: Normal appearance. She is not ill-appearing.  HENT:     Head:  Normocephalic and atraumatic.  Eyes:     General: No scleral icterus. Cardiovascular:     Rate and Rhythm: Normal rate and regular rhythm.     Heart sounds: Normal heart sounds. No murmur. No friction rub. No gallop.   Pulmonary:     Effort: Pulmonary effort is normal. No respiratory distress.     Breath sounds: Normal breath sounds. No stridor. No wheezing, rhonchi or rales.  Abdominal:     General: Bowel sounds are normal. There is no distension.     Palpations: Abdomen is soft. There is no mass.     Tenderness: There is abdominal tenderness. There is no guarding or rebound.     Hernia: No hernia is present.     Comments: Slight tenderness to deep palpation in the right upper quadrant.  No rigidity is noted.  Skin:    General: Skin is warm and dry.  Neurological:     Mental Status: She is alert and oriented to person, place, and time.    Primary care and ER notes reviewed Assessment  Biliary colic, cholelithiasis Plan   Patient is scheduled for laparoscopic cholecystectomy on 03/20/2019.  The risks and benefits of the procedure including bleeding, infection, hepatobiliary injury, the possibility of an open procedure were fully explained to the patient, who gave informed consent.  

## 2019-03-16 NOTE — Patient Instructions (Signed)
[name]  03/16/2019     @   Your procedure is scheduled on  03/20/2019  Report to [facility] at  0700  A.M.  Call this number if you have problems the morning of surgery:  [phone]   Remember:  Do not eat or drink after midnight.                       Take these medicines the morning of surgery with A SIP OF WATER  Prozac, hydrocodone(if needed).    Do not wear jewelry, make-up or nail polish.  Do not wear lotions, powders, or perfumes. Please wear deodorant and brush our teeth.  Do not shave 48 hours prior to surgery.  Men may shave face and neck.  Do not bring valuables to the hospital.  [facility] is not responsible for any belongings or valuables.  Contacts, dentures or bridgework may not be worn into surgery.  Leave your suitcase in the car.  After surgery it may be brought to your room.  For patients admitted to the hospital, discharge time will be determined by your treatment team.  Patients discharged the day of surgery will not be allowed to drive home.   Name and phone number of your driver:   family Special instructions: DO NOT take any medication for diabetes the morning of your surgery.  Please read over the following fact sheets that you were given. Anesthesia Post-op Instructions and Care and Recovery After Surgery       Laparoscopic Cholecystectomy, Care After This sheet gives you information about how to care for yourself after your procedure. Your health care provider may also give you more specific instructions. If you have problems or questions, contact your health care provider. What can I expect after the procedure? After the procedure, it is common to have:  Pain at your incision sites. You will be given medicines to control this pain.  Mild nausea or vomiting.  Bloating and possible shoulder pain from the air-like gas that was used during the procedure. Follow these instructions at home: Incision  care   Follow instructions from your health care provider about how to take care of your incisions. Make sure you: ? Wash your hands with soap and water before you change your bandage (dressing). If soap and water are not available, use hand sanitizer. ? Change your dressing as told by your health care provider. ? Leave stitches (sutures), skin glue, or adhesive strips in place. These skin closures may need to be in place for 2 weeks or longer. If adhesive strip edges start to loosen and curl up, you may trim the loose edges. Do not remove adhesive strips completely unless your health care provider tells you to do that.  Do not take baths, swim, or use a hot tub until your health care provider approves. Ask your health care provider if you can take showers. You may only be allowed to take sponge baths for bathing.  Check your incision area every day for signs of infection. Check for: ? More redness, swelling, or pain. ? More fluid or blood. ? Warmth. ? Pus or a bad smell. Activity  Do not drive or use heavy machinery while taking prescription pain medicine.  Do not lift anything that is heavier than 10 lb (4.5 kg) until your health care provider approves.  Do not play contact sports until your health care provider approves.  Do not  drive for 24 hours if you were given a medicine to help you relax (sedative).  Rest as needed. Do not return to work or school until your health care provider approves. General instructions  Take over-the-counter and prescription medicines only as told by your health care provider.  To prevent or treat constipation while you are taking prescription pain medicine, your health care provider may recommend that you: ? Drink enough fluid to keep your urine clear or pale yellow. ? Take over-the-counter or prescription medicines. ? Eat foods that are high in fiber, such as fresh fruits and vegetables, whole grains, and beans. ? Limit foods that are high in fat  and processed sugars, such as fried and sweet foods. Contact a health care provider if:  You develop a rash.  You have more redness, swelling, or pain around your incisions.  You have more fluid or blood coming from your incisions.  Your incisions feel warm to the touch.  You have pus or a bad smell coming from your incisions.  You have a fever.  One or more of your incisions breaks open. Get help right away if:  You have trouble breathing.  You have chest pain.  You have increasing pain in your shoulders.  You faint or feel dizzy when you stand.  You have severe pain in your abdomen.  You have nausea or vomiting that lasts for more than one day.  You have leg pain. This information is not intended to replace advice given to you by your health care provider. Make sure you discuss any questions you have with your health care provider. Document Released: 03/26/2005 Document Revised: 03/08/2017 Document Reviewed: 09/12/2015 Elsevier Patient Education  2020 [employer]  General Anesthesia, Adult, Care After This sheet gives you information about how to care for yourself after your procedure. Your health care provider may also give you more specific instructions. If you have problems or questions, contact your health care provider. What can I expect after the procedure? After the procedure, the following side effects are common:  Pain or discomfort at the IV site.  Nausea.  Vomiting.  Sore throat.  Trouble concentrating.  Feeling cold or chills.  Weak or tired.  Sleepiness and fatigue.  Soreness and body aches. These side effects can affect parts of the body that were not involved in surgery. Follow these instructions at home:  For at least 24 hours after the procedure:  Have a responsible adult stay with you. It is important to have someone help care for you until you are awake and alert.  Rest as needed.  Do not: ? Participate in activities in which  you could fall or become injured. ? Drive. ? Use heavy machinery. ? Drink alcohol. ? Take sleeping pills or medicines that cause drowsiness. ? Make important decisions or sign legal documents. ? Take care of children on your own. Eating and drinking  Follow any instructions from your health care provider about eating or drinking restrictions.  When you feel hungry, start by eating small amounts of foods that are soft and easy to digest (bland), such as toast. Gradually return to your regular diet.  Drink enough fluid to keep your urine pale yellow.  If you vomit, rehydrate by drinking water, juice, or clear broth. General instructions  If you have sleep apnea, surgery and certain medicines can increase your risk for breathing problems. Follow instructions from your health care provider about wearing your sleep device: ? Anytime you are sleeping, including during  daytime naps. ? While taking prescription pain medicines, sleeping medicines, or medicines that make you drowsy.  Return to your normal activities as told by your health care provider. Ask your health care provider what activities are safe for you.  Take over-the-counter and prescription medicines only as told by your health care provider.  If you smoke, do not smoke without supervision.  Keep all follow-up visits as told by your health care provider. This is important. Contact a health care provider if:  You have nausea or vomiting that does not get better with medicine.  You cannot eat or drink without vomiting.  You have pain that does not get better with medicine.  You are unable to pass urine.  You develop a skin rash.  You have a fever.  You have redness around your IV site that gets worse. Get help right away if:  You have difficulty breathing.  You have chest pain.  You have blood in your urine or stool, or you vomit blood. Summary  After the procedure, it is common to have a sore throat or nausea.  It is also common to feel tired.  Have a responsible adult stay with you for the first 24 hours after general anesthesia. It is important to have someone help care for you until you are awake and alert.  When you feel hungry, start by eating small amounts of foods that are soft and easy to digest (bland), such as toast. Gradually return to your regular diet.  Drink enough fluid to keep your urine pale yellow.  Return to your normal activities as told by your health care provider. Ask your health care provider what activities are safe for you. This information is not intended to replace advice given to you by your health care provider. Make sure you discuss any questions you have with your health care provider. Document Released: 07/02/2000 Document Revised: 03/29/2017 Document Reviewed: 11/09/2016 Elsevier Patient Education  2020 [employer]. How to Use Chlorhexidine for Bathing Chlorhexidine gluconate (CHG) is a germ-killing (antiseptic) solution that is used to clean the skin. It can get rid of the bacteria that normally live on the skin and can keep them away for about 24 hours. To clean your skin with CHG, you may be given:  A CHG solution to use in the shower or as part of a sponge bath.  A prepackaged cloth that contains CHG. Cleaning your skin with CHG may help lower the risk for infection:  While you are staying in the intensive care unit of the hospital.  If you have a vascular access, such as a central line, to provide short-term or long-term access to your veins.  If you have a catheter to drain urine from your bladder.  If you are on a ventilator. A ventilator is a machine that helps you breathe by moving air in and out of your lungs.  After surgery. What are the risks? Risks of using CHG include:  A skin reaction.  Hearing loss, if CHG gets in your ears.  Eye injury, if CHG gets in your eyes and is not rinsed out.  The CHG product catching fire. Make sure that  you avoid smoking and flames after applying CHG to your skin. Do not use CHG:  If you have a chlorhexidine allergy or have previously reacted to chlorhexidine.  On babies younger than 78 months of age. How to use CHG solution  Use CHG only as told by your health care provider, and follow the  instructions on the label.  Use the full amount of CHG as directed. Usually, this is one bottle. During a shower Follow these steps when using CHG solution during a shower (unless your health care provider gives you different instructions): 1. Start the shower. 2. Use your normal soap and shampoo to wash your face and hair. 3. Turn off the shower or move out of the shower stream. 4. Pour the CHG onto a clean washcloth. Do not use any type of brush or rough-edged sponge. 5. Starting at your neck, lather your body down to your toes. Make sure you follow these instructions: ? If you will be having surgery, pay special attention to the part of your body where you will be having surgery. Scrub this area for at least 1 minute. ? Do not use CHG on your head or face. If the solution gets into your ears or eyes, rinse them well with water. ? Avoid your genital area. ? Avoid any areas of skin that have broken skin, cuts, or scrapes. ? Scrub your back and under your arms. Make sure to wash skin folds. 6. Let the lather sit on your skin for 1-2 minutes or as long as told by your health care provider. 7. Thoroughly rinse your entire body in the shower. Make sure that all body creases and crevices are rinsed well. 8. Dry off with a clean towel. Do not put any substances on your body afterward-such as powder, lotion, or perfume-unless you are told to do so by your health care provider. Only use lotions that are recommended by the manufacturer. 9. Put on clean clothes or pajamas. 10. If it is the night before your surgery, sleep in clean sheets.  During a sponge bath Follow these steps when using CHG solution during  a sponge bath (unless your health care provider gives you different instructions): 1. Use your normal soap and shampoo to wash your face and hair. 2. Pour the CHG onto a clean washcloth. 3. Starting at your neck, lather your body down to your toes. Make sure you follow these instructions: ? If you will be having surgery, pay special attention to the part of your body where you will be having surgery. Scrub this area for at least 1 minute. ? Do not use CHG on your head or face. If the solution gets into your ears or eyes, rinse them well with water. ? Avoid your genital area. ? Avoid any areas of skin that have broken skin, cuts, or scrapes. ? Scrub your back and under your arms. Make sure to wash skin folds. 4. Let the lather sit on your skin for 1-2 minutes or as long as told by your health care provider. 5. Using a different clean, wet washcloth, thoroughly rinse your entire body. Make sure that all body creases and crevices are rinsed well. 6. Dry off with a clean towel. Do not put any substances on your body afterward-such as powder, lotion, or perfume-unless you are told to do so by your health care provider. Only use lotions that are recommended by the manufacturer. 7. Put on clean clothes or pajamas. 8. If it is the night before your surgery, sleep in clean sheets. How to use CHG prepackaged cloths  Only use CHG cloths as told by your health care provider, and follow the instructions on the label.  Use the CHG cloth on clean, dry skin.  Do not use the CHG cloth on your head or face unless your health care provider  tells you to.  When washing with the CHG cloth: ? Avoid your genital area. ? Avoid any areas of skin that have broken skin, cuts, or scrapes. Before surgery Follow these steps when using a CHG cloth to clean before surgery (unless your health care provider gives you different instructions): 1. Using the CHG cloth, vigorously scrub the part of your body where you will be  having surgery. Scrub using a back-and-forth motion for 3 minutes. The area on your body should be completely wet with CHG when you are done scrubbing. 2. Do not rinse. Discard the cloth and let the area air-dry. Do not put any substances on the area afterward, such as powder, lotion, or perfume. 3. Put on clean clothes or pajamas. 4. If it is the night before your surgery, sleep in clean sheets.  For general bathing Follow these steps when using CHG cloths for general bathing (unless your health care provider gives you different instructions). 1. Use a separate CHG cloth for each area of your body. Make sure you wash between any folds of skin and between your fingers and toes. Wash your body in the following order, switching to a new cloth after each step: ? The front of your neck, shoulders, and chest. ? Both of your arms, under your arms, and your hands. ? Your stomach and groin area, avoiding the genitals. ? Your right leg and foot. ? Your left leg and foot. ? The back of your neck, your back, and your buttocks. 2. Do not rinse. Discard the cloth and let the area air-dry. Do not put any substances on your body afterward-such as powder, lotion, or perfume-unless you are told to do so by your health care provider. Only use lotions that are recommended by the manufacturer. 3. Put on clean clothes or pajamas. Contact a health care provider if:  Your skin gets irritated after scrubbing.  You have questions about using your solution or cloth. Get help right away if:  Your eyes become very red or swollen.  Your eyes itch badly.  Your skin itches badly and is red or swollen.  Your hearing changes.  You have trouble seeing.  You have swelling or tingling in your mouth or throat.  You have trouble breathing.  You swallow any chlorhexidine. Summary  Chlorhexidine gluconate (CHG) is a germ-killing (antiseptic) solution that is used to clean the skin. Cleaning your skin with CHG may  help to lower your risk for infection.  You may be given CHG to use for bathing. It may be in a bottle or in a prepackaged cloth to use on your skin. Carefully follow your health care provider's instructions and the instructions on the product label.  Do not use CHG if you have a chlorhexidine allergy.  Contact your health care provider if your skin gets irritated after scrubbing. This information is not intended to replace advice given to you by your health care provider. Make sure you discuss any questions you have with your health care provider. Document Released: 12/19/2011 Document Revised: 06/12/2018 Document Reviewed: 02/21/2017 Elsevier Patient Education  2020 [employer].

## 2019-03-18 ENCOUNTER — Other Ambulatory Visit (HOSPITAL_COMMUNITY)
Admission: RE | Admit: 2019-03-18 | Discharge: 2019-03-18 | Disposition: A | Discharge status: Home / Self Care | Payer: Medicaid Other | Source: Ambulatory Visit | Attending: General Surgery | Admitting: General Surgery

## 2019-03-18 ENCOUNTER — Encounter (HOSPITAL_COMMUNITY)
Admission: RE | Admit: 2019-03-18 | Discharge: 2019-03-18 | Disposition: A | Discharge status: Home / Self Care | Payer: Medicaid Other | Source: Ambulatory Visit | Attending: General Surgery | Admitting: General Surgery

## 2019-03-18 ENCOUNTER — Encounter (HOSPITAL_COMMUNITY): Payer: Self-pay

## 2019-03-18 ENCOUNTER — Other Ambulatory Visit: Payer: Self-pay

## 2019-03-18 DIAGNOSIS — Z01812 Encounter for preprocedural laboratory examination: Secondary | ICD-10-CM | POA: Insufficient documentation

## 2019-03-18 DIAGNOSIS — K8020 Calculus of gallbladder without cholecystitis without obstruction: Secondary | ICD-10-CM | POA: Insufficient documentation

## 2019-03-18 HISTORY — DX: Personal history of urinary calculi: Z87.442

## 2019-03-18 LAB — HCG, SERUM, QUALITATIVE: Preg, Serum: NEGATIVE

## 2019-03-18 LAB — HEMOGLOBIN A1C
Hgb A1c MFr Bld: 5.7 % — ABNORMAL HIGH (ref 4.8–5.6)
Mean Plasma Glucose: 116.89 mg/dL

## 2019-03-18 LAB — SARS CORONAVIRUS 2 (TAT 6-24 HRS): SARS Coronavirus 2: NEGATIVE

## 2019-03-19 NOTE — Pre-Procedure Instructions (Signed)
HgbA1C routed to PCP. 

## 2019-03-20 ENCOUNTER — Encounter (HOSPITAL_COMMUNITY): Payer: Self-pay | Admitting: General Surgery

## 2019-03-20 ENCOUNTER — Ambulatory Visit (HOSPITAL_COMMUNITY): Payer: Medicaid Other | Admitting: Anesthesiology

## 2019-03-20 ENCOUNTER — Encounter (HOSPITAL_COMMUNITY)
Admission: RE | Disposition: A | Discharge status: Home / Self Care | Payer: Self-pay | Source: Home / Self Care | Attending: General Surgery

## 2019-03-20 ENCOUNTER — Ambulatory Visit (HOSPITAL_COMMUNITY)
Admission: RE | Admit: 2019-03-20 | Discharge: 2019-03-20 | Disposition: A | Discharge status: Home / Self Care | Payer: Medicaid Other | Attending: General Surgery | Admitting: General Surgery

## 2019-03-20 DIAGNOSIS — E119 Type 2 diabetes mellitus without complications: Secondary | ICD-10-CM | POA: Diagnosis not present

## 2019-03-20 DIAGNOSIS — F419 Anxiety disorder, unspecified: Secondary | ICD-10-CM | POA: Insufficient documentation

## 2019-03-20 DIAGNOSIS — Z79899 Other long term (current) drug therapy: Secondary | ICD-10-CM | POA: Diagnosis not present

## 2019-03-20 DIAGNOSIS — K8010 Calculus of gallbladder with chronic cholecystitis without obstruction: Secondary | ICD-10-CM | POA: Insufficient documentation

## 2019-03-20 DIAGNOSIS — F329 Major depressive disorder, single episode, unspecified: Secondary | ICD-10-CM | POA: Diagnosis not present

## 2019-03-20 DIAGNOSIS — K8020 Calculus of gallbladder without cholecystitis without obstruction: Secondary | ICD-10-CM

## 2019-03-20 DIAGNOSIS — I10 Essential (primary) hypertension: Secondary | ICD-10-CM | POA: Insufficient documentation

## 2019-03-20 DIAGNOSIS — Z87891 Personal history of nicotine dependence: Secondary | ICD-10-CM | POA: Insufficient documentation

## 2019-03-20 HISTORY — PX: CHOLECYSTECTOMY: SHX55

## 2019-03-20 SURGERY — LAPAROSCOPIC CHOLECYSTECTOMY
Anesthesia: General | Site: Abdomen

## 2019-03-20 MED ORDER — KETOROLAC TROMETHAMINE 30 MG/ML IJ SOLN
30.0000 mg | Freq: Once | INTRAMUSCULAR | Status: AC
  Administered 2019-03-20: 30 mg via INTRAVENOUS
  Filled 2019-03-20: qty 1

## 2019-03-20 MED ORDER — MEPERIDINE HCL 50 MG/ML IJ SOLN: 6.2500 mg | INTRAMUSCULAR | Status: DC | PRN

## 2019-03-20 MED ORDER — SUGAMMADEX SODIUM 200 MG/2ML IV SOLN
INTRAVENOUS | Status: DC | PRN
  Administered 2019-03-20: 383.6 mg via INTRAVENOUS

## 2019-03-20 MED ORDER — SUCCINYLCHOLINE CHLORIDE 20 MG/ML IJ SOLN
INTRAMUSCULAR | Status: DC | PRN
  Administered 2019-03-20: 160 mg via INTRAVENOUS

## 2019-03-20 MED ORDER — DEXAMETHASONE SODIUM PHOSPHATE 4 MG/ML IJ SOLN
INTRAMUSCULAR | Status: DC | PRN
  Administered 2019-03-20: 10 mg via INTRAVENOUS

## 2019-03-20 MED ORDER — MIDAZOLAM HCL 2 MG/2ML IJ SOLN
INTRAMUSCULAR | Status: AC
  Filled 2019-03-20: qty 2

## 2019-03-20 MED ORDER — LACTATED RINGERS IV SOLN
Freq: Once | INTRAVENOUS | Status: AC
  Administered 2019-03-20: 1000 mL via INTRAVENOUS

## 2019-03-20 MED ORDER — SODIUM CHLORIDE 0.9 % IR SOLN
Status: DC | PRN
  Administered 2019-03-20: 1000 mL

## 2019-03-20 MED ORDER — HEMOSTATIC AGENTS (NO CHARGE) OPTIME
TOPICAL | Status: DC | PRN
  Administered 2019-03-20: 1 via TOPICAL

## 2019-03-20 MED ORDER — FENTANYL CITRATE (PF) 250 MCG/5ML IJ SOLN
INTRAMUSCULAR | Status: AC
  Filled 2019-03-20: qty 5

## 2019-03-20 MED ORDER — CIPROFLOXACIN IN D5W 400 MG/200ML IV SOLN
400.0000 mg | INTRAVENOUS | Status: AC
  Administered 2019-03-20: 400 mg via INTRAVENOUS

## 2019-03-20 MED ORDER — HYDROMORPHONE HCL 1 MG/ML IJ SOLN
0.2500 mg | INTRAMUSCULAR | Status: DC | PRN
  Administered 2019-03-20 (×2): 0.5 mg via INTRAVENOUS
  Filled 2019-03-20 (×2): qty 0.5

## 2019-03-20 MED ORDER — SCOPOLAMINE 1 MG/3DAYS TD PT72
1.0000 | MEDICATED_PATCH | Freq: Once | TRANSDERMAL | Status: DC
  Administered 2019-03-20: 1.5 mg via TRANSDERMAL

## 2019-03-20 MED ORDER — PROPOFOL 10 MG/ML IV BOLUS
INTRAVENOUS | Status: AC
  Filled 2019-03-20: qty 20

## 2019-03-20 MED ORDER — PROPOFOL 10 MG/ML IV BOLUS
INTRAVENOUS | Status: DC | PRN
  Administered 2019-03-20: 200 mg via INTRAVENOUS

## 2019-03-20 MED ORDER — CHLORHEXIDINE GLUCONATE CLOTH 2 % EX PADS: 6.0000 | MEDICATED_PAD | Freq: Once | CUTANEOUS | Status: DC

## 2019-03-20 MED ORDER — HYDROCODONE-ACETAMINOPHEN 5-325 MG PO TABS: 1.0000 | ORAL_TABLET | ORAL | 0 refills | Status: DC | PRN

## 2019-03-20 MED ORDER — LACTATED RINGERS IV SOLN
INTRAVENOUS | Status: DC | PRN
  Administered 2019-03-20: 08:00:00 via INTRAVENOUS

## 2019-03-20 MED ORDER — ONDANSETRON HCL 4 MG/2ML IJ SOLN
4.0000 mg | Freq: Once | INTRAMUSCULAR | Status: AC | PRN
  Administered 2019-03-20: 4 mg via INTRAVENOUS
  Filled 2019-03-20: qty 2

## 2019-03-20 MED ORDER — CIPROFLOXACIN IN D5W 400 MG/200ML IV SOLN
INTRAVENOUS | Status: AC
  Filled 2019-03-20: qty 200

## 2019-03-20 MED ORDER — SCOPOLAMINE 1 MG/3DAYS TD PT72
MEDICATED_PATCH | TRANSDERMAL | Status: AC
  Filled 2019-03-20: qty 1

## 2019-03-20 MED ORDER — FENTANYL CITRATE (PF) 100 MCG/2ML IJ SOLN
INTRAMUSCULAR | Status: DC | PRN
  Administered 2019-03-20 (×5): 50 ug via INTRAVENOUS

## 2019-03-20 MED ORDER — BUPIVACAINE LIPOSOME 1.3 % IJ SUSP
INTRAMUSCULAR | Status: DC | PRN
  Administered 2019-03-20: 20 mL

## 2019-03-20 MED ORDER — ONDANSETRON HCL 4 MG/2ML IJ SOLN
INTRAMUSCULAR | Status: DC | PRN
  Administered 2019-03-20: 4 mg via INTRAVENOUS

## 2019-03-20 MED ORDER — ROCURONIUM BROMIDE 100 MG/10ML IV SOLN
INTRAVENOUS | Status: DC | PRN
  Administered 2019-03-20: 40 mg via INTRAVENOUS

## 2019-03-20 MED ORDER — MIDAZOLAM HCL 2 MG/2ML IJ SOLN
2.0000 mg | Freq: Once | INTRAMUSCULAR | Status: AC
  Administered 2019-03-20: 2 mg via INTRAVENOUS

## 2019-03-20 SURGICAL SUPPLY — 48 items
APPLIER CLIP ROT 10 11.4 M/L (STAPLE) ×3
BAG RETRIEVAL 10 (BASKET) ×1
BAG RETRIEVAL 10MM (BASKET) ×1
CHLORAPREP W/TINT 26 (MISCELLANEOUS) ×3 IMPLANT
CLIP APPLIE ROT 10 11.4 M/L (STAPLE) ×1 IMPLANT
CLOTH BEACON ORANGE TIMEOUT ST (SAFETY) ×3 IMPLANT
COVER LIGHT HANDLE STERIS (MISCELLANEOUS) ×6 IMPLANT
COVER WAND RF STERILE (DRAPES) ×3 IMPLANT
DERMABOND ADVANCED (GAUZE/BANDAGES/DRESSINGS) ×2
DERMABOND ADVANCED .7 DNX12 (GAUZE/BANDAGES/DRESSINGS) ×1 IMPLANT
ELECT REM PT RETURN 9FT ADLT (ELECTROSURGICAL) ×3
ELECTRODE REM PT RTRN 9FT ADLT (ELECTROSURGICAL) ×1 IMPLANT
GLOVE BIO SURGEON STRL SZ 6.5 (GLOVE) ×2 IMPLANT
GLOVE BIO SURGEON STRL SZ7 (GLOVE) ×3 IMPLANT
GLOVE BIO SURGEONS STRL SZ 6.5 (GLOVE) ×1
GLOVE BIOGEL PI IND STRL 6.5 (GLOVE) ×1 IMPLANT
GLOVE BIOGEL PI IND STRL 7.0 (GLOVE) ×2 IMPLANT
GLOVE BIOGEL PI IND STRL 7.5 (GLOVE) ×1 IMPLANT
GLOVE BIOGEL PI INDICATOR 6.5 (GLOVE) ×2
GLOVE BIOGEL PI INDICATOR 7.0 (GLOVE) ×4
GLOVE BIOGEL PI INDICATOR 7.5 (GLOVE) ×2
GLOVE ECLIPSE 6.5 STRL STRAW (GLOVE) ×3 IMPLANT
GLOVE SURG SS PI 7.5 STRL IVOR (GLOVE) ×3 IMPLANT
GOWN STRL REUS W/TWL LRG LVL3 (GOWN DISPOSABLE) ×12 IMPLANT
HEMOSTAT SNOW SURGICEL 2X4 (HEMOSTASIS) ×3 IMPLANT
INST SET LAPROSCOPIC AP (KITS) ×3 IMPLANT
KIT TURNOVER KIT A (KITS) ×3 IMPLANT
MANIFOLD NEPTUNE II (INSTRUMENTS) ×3 IMPLANT
NEEDLE HYPO 18GX1.5 BLUNT FILL (NEEDLE) ×3 IMPLANT
NEEDLE HYPO 22GX1.5 SAFETY (NEEDLE) ×3 IMPLANT
NEEDLE INSUFFLATION 14GA 120MM (NEEDLE) ×3 IMPLANT
NS IRRIG 1000ML POUR BTL (IV SOLUTION) ×3 IMPLANT
PACK LAP CHOLE LZT030E (CUSTOM PROCEDURE TRAY) ×3 IMPLANT
PAD ARMBOARD 7.5X6 YLW CONV (MISCELLANEOUS) ×3 IMPLANT
SET BASIN LINEN APH (SET/KITS/TRAYS/PACK) ×3 IMPLANT
SET TUBE SMOKE EVAC HIGH FLOW (TUBING) ×3 IMPLANT
SLEEVE ENDOPATH XCEL 5M (ENDOMECHANICALS) ×3 IMPLANT
SUT MNCRL AB 4-0 PS2 18 (SUTURE) ×6 IMPLANT
SUT VICRYL 0 UR6 27IN ABS (SUTURE) ×3 IMPLANT
SYR 20ML LL LF (SYRINGE) ×6 IMPLANT
SYS BAG RETRIEVAL 10MM (BASKET) ×1
SYSTEM BAG RETRIEVAL 10MM (BASKET) ×1 IMPLANT
TROCAR ENDO BLADELESS 11MM (ENDOMECHANICALS) ×3 IMPLANT
TROCAR XCEL NON-BLD 5MMX100MML (ENDOMECHANICALS) ×3 IMPLANT
TROCAR XCEL UNIV SLVE 11M 100M (ENDOMECHANICALS) ×3 IMPLANT
TUBE CONNECTING 12'X1/4 (SUCTIONS) ×1
TUBE CONNECTING 12X1/4 (SUCTIONS) ×2 IMPLANT
WARMER LAPAROSCOPE (MISCELLANEOUS) ×3 IMPLANT

## 2019-03-20 NOTE — Transfer of Care (Signed)
Immediate Anesthesia Transfer of Care Note  Patient: [name]  Procedure(s) Performed: LAPAROSCOPIC CHOLECYSTECTOMY (N/A Abdomen)  Patient Location: PACU  Anesthesia Type:General  Level of Consciousness: oriented and drowsy  Airway & Oxygen Therapy: Patient Spontanous Breathing and Patient connected to face mask oxygen  Post-op Assessment: Report given to RN and Post -op Vital signs reviewed and stable  Post vital signs: Reviewed and stable  Last Vitals:  Vitals Value Taken Time  BP 93/81 03/20/19 0926  Temp    Pulse 97 03/20/19 0929  Resp 12 03/20/19 0929  SpO2 100 % 03/20/19 0929  Vitals shown include unvalidated device data.  Last Pain:  Vitals:   03/20/19 0742  TempSrc: Oral  PainSc: 0-No pain         Complications: No apparent anesthesia complications

## 2019-03-20 NOTE — Interval H&P Note (Signed)
History and Physical Interval Note:  03/20/2019 8:05 AM  [name]  has presented today for surgery, with the diagnosis of Cholelithiasis.  The various methods of treatment have been discussed with the patient and family. After consideration of risks, benefits and other options for treatment, the patient has consented to  Procedure(s): LAPAROSCOPIC CHOLECYSTECTOMY (N/A) as a surgical intervention.  The patient's history has been reviewed, patient examined, no change in status, stable for surgery.  I have reviewed the patient's chart and labs.  Questions were answered to the patient's satisfaction.     Aviva Signs

## 2019-03-20 NOTE — Op Note (Signed)
Patient:  [name]  DOB:  Jun 01, 1980  MRN:  [id number]   Preop Diagnosis: Biliary colic, cholelithiasis  Postop Diagnosis: Same  Procedure: Laparoscopic cholecystectomy  Surgeon: Aviva Signs, MD  Anes: General endotracheal  Indications: Patient is a 38 year old white female who presents with biliary colic secondary to cholelithiasis.  The risks and benefits of the procedure including bleeding, infection, hepatobiliary injury, and the possibility of an open procedure were fully explained to the patient, who gave informed consent.  Procedure note: The patient was placed in supine position.  After induction of general endotracheal anesthesia, the abdomen was prepped and draped using the usual sterile technique with ChloraPrep.  Surgical site confirmation was performed.  A supraumbilical incision was made down to the fascia.  A Veress needle was introduced into the abdominal cavity and confirmation of placement was done using the saline drop test.  The abdomen was then insufflated to 15 mmHg pressure.  An 11 mm trocar was introduced into the abdominal cavity under direct visualization without difficulty.  The patient was placed in reverse Trendelenburg position and an additional 11 mm trocar was placed in the epigastric region and 5 mm trochars were placed the right upper quadrant and right flank regions.  The liver was inspected and noted to be within normal limits.  The gallbladder was retracted not dynamic fashion in order to provide a critical view of the triangle of Calot.  The cystic duct was first identified.  Its juncture to the infundibulum was fully identified.  Endoclips were placed proximally and distally on the cystic duct, and the cystic duct was divided.  This was likewise done to the cystic artery.  There was also a possible accessory duct along the right gallbladder fossa and this was clipped.  The gallbladder was freed away from the gallbladder fossa using Bovie  electrocautery.  The gallbladder was delivered through the epigastric trocar site using an Endo Catch bag.  The gallbladder fossa was inspected and no abnormal bleeding or bile leakage was noted.  Emogene Morgan was placed in the gallbladder fossa.  All fluid and air were then evacuated from the abdominal cavity prior to removal of the trochars.  All wounds were irrigated with normal saline.  All wounds were injected with Exparel.  All skin incisions were closed using a 4-0 Monocryl subcuticular suture.  Dermabond was applied.  All tape and needle counts were correct at the end of the procedure.  The patient was extubated in the operating room and transferred to PACU in stable condition.  Complications: None  EBL: Minimal  Specimen: Gallbladder

## 2019-03-20 NOTE — Anesthesia Postprocedure Evaluation (Signed)
Anesthesia Post Note Late Entry for 0940 Patient: [name]  Procedure(s) Performed: LAPAROSCOPIC CHOLECYSTECTOMY (N/A Abdomen)  Patient location during evaluation: PACU Anesthesia Type: General Level of consciousness: awake and alert, oriented and patient cooperative Pain management: pain level controlled Vital Signs Assessment: post-procedure vital signs reviewed and stable Respiratory status: spontaneous breathing Postop Assessment: no apparent nausea or vomiting Anesthetic complications: no     Last Vitals:  Vitals:   03/20/19 1000 03/20/19 1015  BP: 123/86 119/85  Pulse: 93 100  Resp: 16 (!) 26  Temp:    SpO2: 100% 96%    Last Pain:  Vitals:   03/20/19 1000  TempSrc:   PainSc: 7                  Osvaldo Lamping A

## 2019-03-20 NOTE — Anesthesia Procedure Notes (Signed)
Procedure Name: Intubation Date/Time: 03/20/2019 8:45 AM Performed by: Andree Elk, Mylinda Brook A, CRNA Pre-anesthesia Checklist: Patient identified, Patient being monitored, Timeout performed, Emergency Drugs available and Suction available Patient Re-evaluated:Patient Re-evaluated prior to induction Oxygen Delivery Method: Circle system utilized Preoxygenation: Pre-oxygenation with 100% oxygen Induction Type: IV induction Ventilation: Mask ventilation without difficulty Laryngoscope Size: Mac and 3 Grade View: Grade I Tube type: Oral Tube size: 7.0 mm Number of attempts: 1 Airway Equipment and Method: Stylet Placement Confirmation: ETT inserted through vocal cords under direct vision,  positive ETCO2 and breath sounds checked- equal and bilateral Secured at: 21 cm Tube secured with: Tape Dental Injury: Teeth and Oropharynx as per pre-operative assessment

## 2019-03-20 NOTE — Anesthesia Preprocedure Evaluation (Signed)
Anesthesia Evaluation  Patient identified by MRN, date of birth, ID band Patient awake    Reviewed: Allergy & Precautions, NPO status , Patient's Chart, lab work & pertinent test results  History of Anesthesia Complications (+) history of anesthetic complications  Airway Mallampati: II       Dental  (+) Edentulous Upper, Edentulous Lower   Pulmonary Recent URI , Resolved, former smoker,    Pulmonary exam normal breath sounds clear to auscultation       Cardiovascular Exercise Tolerance: Good hypertension, Pt. on medications Normal cardiovascular exam+ dysrhythmias  Rhythm:Regular Rate:Normal     Neuro/Psych  Headaches, PSYCHIATRIC DISORDERS Anxiety Depression    GI/Hepatic negative GI ROS, Cholecystitis   Endo/Other  diabetes, Well Controlled, Type 2  Renal/GU      Musculoskeletal   Abdominal   Peds  Hematology  (+) anemia ,   Anesthesia Other Findings   Reproductive/Obstetrics                            Anesthesia Physical Anesthesia Plan  ASA: II  Anesthesia Plan: General   Post-op Pain Management:    Induction:   PONV Risk Score and Plan: 4 or greater and Ondansetron, Dexamethasone, Midazolam and Scopolamine patch - Pre-op  Airway Management Planned: Oral ETT  Additional Equipment:   Intra-op Plan:   Post-operative Plan: Extubation in OR  Informed Consent: I have reviewed the patients History and Physical, chart, labs and discussed the procedure including the risks, benefits and alternatives for the proposed anesthesia with the patient or authorized representative who has indicated his/her understanding and acceptance.       Plan Discussed with: CRNA  Anesthesia Plan Comments:         Anesthesia Quick Evaluation

## 2019-03-20 NOTE — Discharge Instructions (Signed)
Laparoscopic Cholecystectomy, Care After °This sheet gives you information about how to care for yourself after your procedure. Your health care provider may also give you more specific instructions. If you have problems or questions, contact your health care provider. °What can I expect after the procedure? °After the procedure, it is common to have: °· Pain at your incision sites. You will be given medicines to control this pain. °· Mild nausea or vomiting. °· Bloating and possible shoulder pain from the air-like gas that was used during the procedure. °Follow these instructions at home: °Incision care ° °· Follow instructions from your health care provider about how to take care of your incisions. Make sure you: °? Wash your hands with soap and water before you change your bandage (dressing). If soap and water are not available, use hand sanitizer. °? Change your dressing as told by your health care provider. °? Leave stitches (sutures), skin glue, or adhesive strips in place. These skin closures may need to be in place for 2 weeks or longer. If adhesive strip edges start to loosen and curl up, you may trim the loose edges. Do not remove adhesive strips completely unless your health care provider tells you to do that. °· Do not take baths, swim, or use a hot tub until your health care provider approves. Ask your health care provider if you can take showers. You may only be allowed to take sponge baths for bathing. °· Check your incision area every day for signs of infection. Check for: °? More redness, swelling, or pain. °? More fluid or blood. °? Warmth. °? Pus or a bad smell. °Activity °· Do not drive or use heavy machinery while taking prescription pain medicine. °· Do not lift anything that is heavier than 10 lb (4.5 kg) until your health care provider approves. °· Do not play contact sports until your health care provider approves. °· Do not drive for 24 hours if you were given a medicine to help you relax  (sedative). °· Rest as needed. Do not return to work or school until your health care provider approves. °General instructions °· Take over-the-counter and prescription medicines only as told by your health care provider. °· To prevent or treat constipation while you are taking prescription pain medicine, your health care provider may recommend that you: °? Drink enough fluid to keep your urine clear or pale yellow. °? Take over-the-counter or prescription medicines. °? Eat foods that are high in fiber, such as fresh fruits and vegetables, whole grains, and beans. °? Limit foods that are high in fat and processed sugars, such as fried and sweet foods. °Contact a health care provider if: °· You develop a rash. °· You have more redness, swelling, or pain around your incisions. °· You have more fluid or blood coming from your incisions. °· Your incisions feel warm to the touch. °· You have pus or a bad smell coming from your incisions. °· You have a fever. °· One or more of your incisions breaks open. °Get help right away if: °· You have trouble breathing. °· You have chest pain. °· You have increasing pain in your shoulders. °· You faint or feel dizzy when you stand. °· You have severe pain in your abdomen. °· You have nausea or vomiting that lasts for more than one day. °· You have leg pain. °This information is not intended to replace advice given to you by your health care provider. Make sure you discuss any questions you have   with your health care provider. °Document Released: 03/26/2005 Document Revised: 03/08/2017 Document Reviewed: 09/12/2015 °Elsevier Patient Education © 2020 [employer] ° ° ° ° °General Anesthesia, Adult, Care After °This sheet gives you information about how to care for yourself after your procedure. Your health care provider may also give you more specific instructions. If you have problems or questions, contact your health care provider. °What can I expect after the procedure? °After the  procedure, the following side effects are common: °· Pain or discomfort at the IV site. °· Nausea. °· Vomiting. °· Sore throat. °· Trouble concentrating. °· Feeling cold or chills. °· Weak or tired. °· Sleepiness and fatigue. °· Soreness and body aches. These side effects can affect parts of the body that were not involved in surgery. °Follow these instructions at home: ° °For at least 24 hours after the procedure: °· Have a responsible adult stay with you. It is important to have someone help care for you until you are awake and alert. °· Rest as needed. °· Do not: °? Participate in activities in which you could fall or become injured. °? Drive. °? Use heavy machinery. °? Drink alcohol. °? Take sleeping pills or medicines that cause drowsiness. °? Make important decisions or sign legal documents. °? Take care of children on your own. °Eating and drinking °· Follow any instructions from your health care provider about eating or drinking restrictions. °· When you feel hungry, start by eating small amounts of foods that are soft and easy to digest (bland), such as toast. Gradually return to your regular diet. °· Drink enough fluid to keep your urine pale yellow. °· If you vomit, rehydrate by drinking water, juice, or clear broth. °General instructions °· If you have sleep apnea, surgery and certain medicines can increase your risk for breathing problems. Follow instructions from your health care provider about wearing your sleep device: °? Anytime you are sleeping, including during daytime naps. °? While taking prescription pain medicines, sleeping medicines, or medicines that make you drowsy. °· Return to your normal activities as told by your health care provider. Ask your health care provider what activities are safe for you. °· Take over-the-counter and prescription medicines only as told by your health care provider. °· If you smoke, do not smoke without supervision. °· Keep all follow-up visits as told by your  health care provider. This is important. °Contact a health care provider if: °· You have nausea or vomiting that does not get better with medicine. °· You cannot eat or drink without vomiting. °· You have pain that does not get better with medicine. °· You are unable to pass urine. °· You develop a skin rash. °· You have a fever. °· You have redness around your IV site that gets worse. °Get help right away if: °· You have difficulty breathing. °· You have chest pain. °· You have blood in your urine or stool, or you vomit blood. °Summary °· After the procedure, it is common to have a sore throat or nausea. It is also common to feel tired. °· Have a responsible adult stay with you for the first 24 hours after general anesthesia. It is important to have someone help care for you until you are awake and alert. °· When you feel hungry, start by eating small amounts of foods that are soft and easy to digest (bland), such as toast. Gradually return to your regular diet. °· Drink enough fluid to keep your urine pale yellow. °· Return   to your normal activities as told by your health care provider. Ask your health care provider what activities are safe for you. °This information is not intended to replace advice given to you by your health care provider. Make sure you discuss any questions you have with your health care provider. °Document Released: 07/02/2000 Document Revised: 03/29/2017 Document Reviewed: 11/09/2016 °Elsevier Patient Education © 2020 [employer] Inc[employer] ° °

## 2019-03-23 LAB — SURGICAL PATHOLOGY

## 2019-03-30 ENCOUNTER — Telehealth (INDEPENDENT_AMBULATORY_CARE_PROVIDER_SITE_OTHER): Payer: Self-pay | Admitting: General Surgery

## 2019-03-30 ENCOUNTER — Other Ambulatory Visit: Payer: Self-pay

## 2019-03-30 ENCOUNTER — Telehealth: Payer: Self-pay | Admitting: General Surgery

## 2019-03-30 DIAGNOSIS — Z09 Encounter for follow-up examination after completed treatment for conditions other than malignant neoplasm: Secondary | ICD-10-CM

## 2019-03-30 NOTE — Telephone Encounter (Signed)
Postoperative telephone visit performed.  Patient states she is doing well.  She does have mild indigestion which is going away.  I told her to try Mylanta Gas or Mylicon as needed.  She denies any fever or chills.  She was instructed to follow-up with me as needed should any problems arise.

## 2019-05-05 ENCOUNTER — Other Ambulatory Visit: Payer: Self-pay

## 2019-05-05 ENCOUNTER — Ambulatory Visit
Admission: EM | Admit: 2019-05-05 | Discharge: 2019-05-05 | Disposition: A | Discharge status: Home / Self Care | Payer: Medicaid Other | Attending: Emergency Medicine | Admitting: Emergency Medicine

## 2019-05-05 DIAGNOSIS — Z20822 Contact with and (suspected) exposure to covid-19: Secondary | ICD-10-CM | POA: Diagnosis not present

## 2019-05-05 DIAGNOSIS — J320 Chronic maxillary sinusitis: Secondary | ICD-10-CM

## 2019-05-05 MED ORDER — FLUTICASONE PROPIONATE 50 MCG/ACT NA SUSP: 1.0000 | Freq: Every day | NASAL | 0 refills | Status: DC

## 2019-05-05 MED ORDER — AMOXICILLIN-POT CLAVULANATE 875-125 MG PO TABS: 1.0000 | ORAL_TABLET | Freq: Two times a day (BID) | ORAL | 0 refills | Status: DC

## 2019-05-05 NOTE — ED Triage Notes (Signed)
Pt presents to UC for covid test. Pt states she has had sinus sx x4-6 weeks. She has nasal drainage and sinus facial pain.

## 2019-05-05 NOTE — ED Provider Notes (Addendum)
[facility]    CSN: [id number] Arrival date & time: 05/05/19  1904      History   Chief Complaint Chief Complaint  Patient presents with  . sinus sxs    HPI [name] is a 39 y.o. female.   who presents to the urgent care for complaint of nasal congestion, sinus pressure and sinus pain for the past 46 days.  Reports yellow and green mucus discharge.  Denies sick exposure to COVID, flu or strep.  Denies recent travel.  Denies aggravating or alleviating symptoms.  Denies previous COVID infection.   Denies fever, chills, fatigue, nasal congestion, rhinorrhea, sore throat, cough, SOB, wheezing, chest pain, nausea, vomiting, changes in bowel or bladder habits.    The history is provided by the patient. No language interpreter was used.    Past Medical History:  Diagnosis Date  . Anemia   . Anxiety "fast hearbeat"   took Benadryl at end of pregnancy  . Back pain affecting pregnancy in first trimester 05/27/2015  . Complication of anesthesia    Epidural 1 sided  . Depression    During 1 pregnancy  . Diabetes mellitus without complication (HCC)   . Endometriosis   . Gallstones   . Gestational diabetes    just started checking BS 03/05/12  . Headache(784.0)   . Hematuria 05/27/2015  . History of kidney stones   . Irregular periods   . Nausea 01/27/2015  . Pregnancy induced hypertension   . Pregnant 01/27/2015  . Recurrent upper respiratory infection (URI)    Mostly when pregnant  . Spotting affecting pregnancy in first trimester 01/27/2015    Patient Active Problem List   Diagnosis Date Noted  . Calculus of gallbladder without cholecystitis without obstruction   . Gestational hypertension 01/23/2016  . Transverse fetal lie 01/05/2016  . Chest pain w/ Right bundle branch block 08/08/2015  . Poor dentition 07/19/2015  . Supervision of normal pregnancy 06/21/2015  . History of gestational diabetes in prior pregnancy, currently pregnant 06/21/2015  .  History of gestational hypertension 06/21/2015  . AMA (advanced maternal age) multigravida 35+ 06/21/2015  . Grand multipara 06/21/2015  . Rh negative state in antepartum period 06/21/2015  . Hematuria 05/27/2015  . Anxiety 03/05/2012    Past Surgical History:  Procedure Laterality Date  . CHOLECYSTECTOMY N/A 03/20/2019   Procedure: LAPAROSCOPIC CHOLECYSTECTOMY;  Surgeon: Franky Macho, MD;  Location: [facility];  Service: General;  Laterality: N/A;  . MULTIPLE TOOTH EXTRACTIONS    . WISDOM TOOTH EXTRACTION      OB History    Gravida  8   Para  6   Term  6   Preterm  0   AB  2   Living  6     SAB  2   TAB      Ectopic      Multiple  0   Live Births  6        Obstetric Comments  Dr. Lorie Apley Tree.          Home Medications    Prior to Admission medications   Medication Sig Start Date End Date Taking? Authorizing Provider  amoxicillin-clavulanate (AUGMENTIN) 875-125 MG tablet Take 1 tablet by mouth every 12 (twelve) hours. 05/05/19   Kiara Keep, Zachery Dakins, FNP  FLUoxetine (PROZAC) 20 MG/5ML solution Take 5 mLs (20 mg total) by mouth daily. 12/31/17   Aliene Beams, MD  fluticasone (FLONASE) 50 MCG/ACT nasal spray Place 1 spray into both nostrils daily for  14 days. 05/05/19 05/19/19  Nehemias Sauceda, Darrelyn Hillock, FNP  HYDROcodone-acetaminophen (NORCO) 5-325 MG tablet Take 1 tablet by mouth every 4 (four) hours as needed for moderate pain. 03/20/19   Aviva Signs, MD  loratadine (CLARITIN) 10 MG tablet Take 10 mg by mouth at bedtime.     [provider]    Family History Family History  Problem Relation Age of Onset  . Heart disease Maternal Grandmother   . Cancer Maternal Grandmother        brain  . Heart failure Maternal Grandmother   . Breast cancer Maternal Grandmother   . Cancer Paternal Grandmother        brain  . Hypertension Mother   . Anxiety disorder Mother   . Depression Mother   . Hypertension Father   . Anxiety disorder Sister   . ADD /  ADHD Daughter   . Asthma Daughter   . ADD / ADHD Daughter   . Seizures Daughter   . ADD / ADHD Daughter   . ADD / ADHD Daughter     Social History Social History   Tobacco Use  . Smoking status: Former Smoker    Packs/day: 0.10    Years: 0.50    Pack years: 0.05    Types: Cigarettes    Start date: 02/19/1998    Quit date: 09/17/1998    Years since quitting: 20.6  . Smokeless tobacco: Never Used  Substance Use Topics  . Alcohol use: No    Alcohol/week: 0.0 standard drinks    Comment: not while pregnant  . Drug use: No     Allergies   Bactrim ds [sulfamethoxazole-trimethoprim]   Review of Systems Review of Systems  Constitutional: Negative.   HENT: Positive for congestion, sinus pressure and sinus pain.   Respiratory: Negative.   Cardiovascular: Negative.   Gastrointestinal: Negative.   Neurological: Negative.      Physical Exam Triage Vital Signs ED Triage Vitals  Enc Vitals Group     BP      Pulse      Resp      Temp      Temp src      SpO2      Weight      Height      Head Circumference      Peak Flow      Pain Score      Pain Loc      Pain Edu?      Excl. in [place]?    No data found.  Updated Vital Signs BP (!) 155/102 (BP Location: Right Arm)   Pulse 96   Temp 98.2 F (36.8 C) (Oral)   Resp 16   SpO2 97%   Visual Acuity Right Eye Distance:   Left Eye Distance:   Bilateral Distance:    Right Eye Near:   Left Eye Near:    Bilateral Near:     Physical Exam Vitals and nursing note reviewed.  Constitutional:      General: She is not in acute distress.    Appearance: Normal appearance. She is normal weight. She is not ill-appearing or toxic-appearing.  HENT:     Head: Normocephalic.     Right Ear: Tympanic membrane, ear canal and external ear normal. There is no impacted cerumen.     Left Ear: Tympanic membrane, ear canal and external ear normal. There is no impacted cerumen.     Nose: Nose normal. No congestion.     Mouth/Throat:  Mouth: Mucous membranes are moist.     Pharynx: Oropharynx is clear. No oropharyngeal exudate or posterior oropharyngeal erythema.  Cardiovascular:     Rate and Rhythm: Normal rate and regular rhythm.     Pulses: Normal pulses.     Heart sounds: Normal heart sounds. No murmur.  Pulmonary:     Effort: Pulmonary effort is normal. No respiratory distress.     Breath sounds: Normal breath sounds. No wheezing or rhonchi.  Chest:     Chest wall: No tenderness.  Abdominal:     General: Abdomen is flat. Bowel sounds are normal. There is no distension.     Palpations: There is no mass.     Tenderness: There is no abdominal tenderness.  Skin:    Capillary Refill: Capillary refill takes less than 2 seconds.  Neurological:     General: No focal deficit present.     Mental Status: She is alert and oriented to person, place, and time.      UC Treatments / Results  Labs (all labs ordered are listed, but only abnormal results are displayed) Labs Reviewed  NOVEL CORONAVIRUS, NAA    EKG   Radiology No results found.  Procedures Procedures (including critical care time)  Medications Ordered in UC Medications - No data to display  Initial Impression / Assessment and Plan / UC Course  I have reviewed the triage vital signs and the nursing notes.  Pertinent labs & imaging results that were available during my care of the patient were reviewed by me and considered in my medical decision making (see chart for details).    COVID-19 test was ordered Augmentin prescribed for bacterial sinusitis Flonase was prescribed Advised patient to quarantine To go to ED for worsening of symptoms Patient verbalized an understanding of the plan of care  Final Clinical Impressions(s) / UC Diagnoses   Final diagnoses:  COVID-19 ruled out  Chronic maxillary sinusitis     Discharge Instructions     COVID testing ordered.  It will take between 2-7 days for test results.  Someone will contact  you regarding abnormal results.    In the meantime: You should remain isolated in your home for 10 days from symptom onset AND greater than 72 hours after symptoms resolution (absence of fever without the use of fever-reducing medication and improvement in respiratory symptoms), whichever is longer Get plenty of rest and push fluids Use medications daily for symptom relief Use OTC medications like ibuprofen or tylenol as needed fever or pain Call or go to the ED if you have any new or worsening symptoms such as fever, worsening cough, shortness of breath, chest tightness, chest pain, turning blue, changes in mental status, etc...     ED Prescriptions    Medication Sig Dispense Auth. Provider   fluticasone (FLONASE) 50 MCG/ACT nasal spray Place 1 spray into both nostrils daily for 14 days. 16 g Aidden Markovic, Zachery Dakins, FNP   amoxicillin-clavulanate (AUGMENTIN) 875-125 MG tablet Take 1 tablet by mouth every 12 (twelve) hours. 14 tablet Janayah Zavada, Zachery Dakins, FNP     PDMP not reviewed this encounter.   Durward Parcel, FNP 05/05/19 1936    Durward Parcel, FNP 05/05/19 1955

## 2019-05-05 NOTE — Discharge Instructions (Addendum)

## 2019-05-06 LAB — NOVEL CORONAVIRUS, NAA: SARS-CoV-2, NAA: DETECTED — AB

## 2019-05-07 ENCOUNTER — Telehealth: Payer: Self-pay | Admitting: Adult Health

## 2019-05-07 NOTE — Telephone Encounter (Signed)
Called patient about her positivity and whether or not she would benefit from monoclonal antiobdy therapy.  Her symptoms onset was 2-3 weeks ago.  She is not eligible for treatment since symptom onset was outside of 10 day window.  Patient voiced understanding.  Thanked her for call.    Lillard Anes, NP

## 2019-05-31 DIAGNOSIS — K8020 Calculus of gallbladder without cholecystitis without obstruction: Secondary | ICD-10-CM

## 2019-05-31 HISTORY — DX: Calculus of gallbladder without cholecystitis without obstruction: K80.20

## 2020-01-01 DIAGNOSIS — F419 Anxiety disorder, unspecified: Secondary | ICD-10-CM | POA: Diagnosis not present

## 2020-01-01 DIAGNOSIS — Z0000 Encounter for general adult medical examination without abnormal findings: Secondary | ICD-10-CM | POA: Diagnosis not present

## 2020-01-01 DIAGNOSIS — N926 Irregular menstruation, unspecified: Secondary | ICD-10-CM | POA: Diagnosis not present

## 2020-01-01 DIAGNOSIS — Z79899 Other long term (current) drug therapy: Secondary | ICD-10-CM | POA: Diagnosis not present

## 2020-01-19 ENCOUNTER — Ambulatory Visit (INDEPENDENT_AMBULATORY_CARE_PROVIDER_SITE_OTHER): Payer: Medicaid Other | Admitting: Adult Health

## 2020-01-19 ENCOUNTER — Encounter: Payer: Self-pay | Admitting: Adult Health

## 2020-01-19 VITALS — BP 137/94 | HR 75 | Ht 64.75 in | Wt 207.0 lb

## 2020-01-19 DIAGNOSIS — R1031 Right lower quadrant pain: Secondary | ICD-10-CM | POA: Diagnosis not present

## 2020-01-19 DIAGNOSIS — N926 Irregular menstruation, unspecified: Secondary | ICD-10-CM

## 2020-01-19 LAB — POCT URINE PREGNANCY: Preg Test, Ur: NEGATIVE

## 2020-01-19 MED ORDER — MEDROXYPROGESTERONE ACETATE 10 MG PO TABS: ORAL_TABLET | ORAL | 0 refills | Status: DC

## 2020-01-19 NOTE — Progress Notes (Signed)
  Subjective:     Patient ID: [name], female   DOB: 10-28-1980, 39 y.o.   MRN: [id number]  HPI [name] is a 39 year old white female,divorced, [id number] in complaining of RLQ pain radiates to back, had GB removed last year, and has not had period since July. She said PCP said could have endometriosis, after US done last Fall, did explain can't see endometriosis on [place]. PCP is [facility].  Review of Systems RLQ pain radiates to back Missed periods Denies any pain with sex Reviewed past medical,surgical, social and family history. Reviewed medications and allergies.     Objective:   Physical Exam BP (!) 137/94 (BP Location: Left Arm, Patient Position: Sitting, Cuff Size: Normal)   Pulse 75   Ht 5' 4.75" (1.645 m)   Wt 207 lb (93.9 kg)   LMP 10/21/2019 (Approximate)   Breastfeeding No   BMI 34.71 kg/m UPT is negative. Skin warm and dry. Neck: mid line trachea, normal thyroid, good ROM, no lymphadenopathy noted. Lungs: clear to ausculation bilaterally. Cardiovascular: regular rate and rhythm. Abdomen is tender esp right side.  AA is 0  Fall risk is low PHQ 9 score is 1  Upstream - 01/19/20 1055      Pregnancy Intention Screening   Does the patient want to become pregnant in the next year? Unsure    Does the patient's partner want to become pregnant in the next year? Unsure    Would the patient like to discuss contraceptive options today? No      Contraception Wrap Up   Current Method No Method - Other Reason    End Method No Method - Other Reason    Contraception Counseling Provided No             Assessment:     1. Missed periods UPT is negative, will rx provera 10 mg for 10 days to see if can get withdrawal bleed Meds ordered this encounter  Medications  . medroxyPROGESTERone (PROVERA) 10 MG tablet    Sig: Take 1 daily for 10 days    Dispense:  10 tablet    Refill:  0    Order Specific Question:   Supervising Provider    Answer:   Despina Hidden, LUTHER H [[id number]]    2. RLQ  abdominal pain     Plan:     Follow up with me in 3 weeks May need [place] if pain continues

## 2020-01-20 DIAGNOSIS — Z713 Dietary counseling and surveillance: Secondary | ICD-10-CM | POA: Diagnosis not present

## 2020-01-20 DIAGNOSIS — Z6835 Body mass index (BMI) 35.0-35.9, adult: Secondary | ICD-10-CM | POA: Diagnosis not present

## 2020-01-20 DIAGNOSIS — N390 Urinary tract infection, site not specified: Secondary | ICD-10-CM | POA: Diagnosis not present

## 2020-01-20 DIAGNOSIS — Z299 Encounter for prophylactic measures, unspecified: Secondary | ICD-10-CM | POA: Diagnosis not present

## 2020-01-20 DIAGNOSIS — R1031 Right lower quadrant pain: Secondary | ICD-10-CM | POA: Diagnosis not present

## 2020-02-04 DIAGNOSIS — Z9289 Personal history of other medical treatment: Secondary | ICD-10-CM | POA: Diagnosis not present

## 2020-02-04 DIAGNOSIS — K760 Fatty (change of) liver, not elsewhere classified: Secondary | ICD-10-CM | POA: Diagnosis not present

## 2020-02-04 DIAGNOSIS — R109 Unspecified abdominal pain: Secondary | ICD-10-CM | POA: Diagnosis not present

## 2020-02-09 ENCOUNTER — Ambulatory Visit: Payer: Medicaid Other | Admitting: Obstetrics & Gynecology

## 2020-02-09 ENCOUNTER — Ambulatory Visit: Payer: Medicaid Other | Admitting: Adult Health

## 2020-02-26 ENCOUNTER — Ambulatory Visit: Payer: Medicaid Other | Admitting: Adult Health

## 2020-04-26 ENCOUNTER — Ambulatory Visit
Admission: EM | Admit: 2020-04-26 | Discharge: 2020-04-26 | Disposition: A | Discharge status: Home / Self Care | Payer: Medicaid Other | Attending: Family Medicine | Admitting: Family Medicine

## 2020-04-26 ENCOUNTER — Other Ambulatory Visit: Payer: Self-pay

## 2020-04-26 ENCOUNTER — Encounter: Payer: Self-pay | Admitting: Emergency Medicine

## 2020-04-26 DIAGNOSIS — Z3202 Encounter for pregnancy test, result negative: Secondary | ICD-10-CM

## 2020-04-26 DIAGNOSIS — Z20822 Contact with and (suspected) exposure to covid-19: Secondary | ICD-10-CM | POA: Diagnosis not present

## 2020-04-26 DIAGNOSIS — R6883 Chills (without fever): Secondary | ICD-10-CM | POA: Diagnosis not present

## 2020-04-26 DIAGNOSIS — R110 Nausea: Secondary | ICD-10-CM | POA: Diagnosis not present

## 2020-04-26 DIAGNOSIS — R1031 Right lower quadrant pain: Secondary | ICD-10-CM

## 2020-04-26 LAB — POCT URINALYSIS DIP (MANUAL ENTRY)
Bilirubin, UA: NEGATIVE
Glucose, UA: NEGATIVE mg/dL
Ketones, POC UA: NEGATIVE mg/dL
Leukocytes, UA: NEGATIVE
Nitrite, UA: NEGATIVE
Protein Ur, POC: NEGATIVE mg/dL
Spec Grav, UA: 1.01 (ref 1.010–1.025)
Urobilinogen, UA: 0.2 E.U./dL
pH, UA: 5.5 (ref 5.0–8.0)

## 2020-04-26 LAB — POCT URINE PREGNANCY: Preg Test, Ur: NEGATIVE

## 2020-04-26 NOTE — ED Triage Notes (Signed)
Stomach pain started around 2pm today.  States she feels bloated.  Pain started to get worse around 330.  Chills started and now is nauseated.

## 2020-04-26 NOTE — Discharge Instructions (Signed)
If your pain is getting worse, go to the ER for further evaluation and treatment.  You may need a CT of your abdomen and we cannot rule out appendicitis in the office

## 2020-04-29 LAB — COVID-19, FLU A+B NAA
Influenza A, NAA: NOT DETECTED
Influenza B, NAA: NOT DETECTED
SARS-CoV-2, NAA: NOT DETECTED

## 2020-05-01 NOTE — ED Provider Notes (Signed)
[facility]    CSN: [id number] Arrival date & time: 04/26/20  1805      History   Chief Complaint No chief complaint on file.   HPI [name] is a 40 y.o. female.   Reports RLQ abdominal pain since this afternoon. States that the pain is coming in waves. Reports nausea, vomiting, chills associated with pain. States that her abdomen is larger than usual. Denies urinary symptoms, constipation, diarrhea. Reports that when the pain is at its worst, it is a 10/10 and she cannot stand up straight. There are not aggravating or alleviating factors. Has not attempted OTC treatment.  ROS per HPI  The history is provided by the patient.    Past Medical History:  Diagnosis Date  . Anemia   . Anxiety "fast hearbeat"   took Benadryl at end of pregnancy  . Back pain affecting pregnancy in first trimester 05/27/2015  . Complication of anesthesia    Epidural 1 sided  . Depression    During 1 pregnancy  . Diabetes mellitus without complication (HCC)   . Endometriosis   . Gallstones   . Gestational diabetes    just started checking BS 03/05/12  . Headache(784.0)   . Hematuria 05/27/2015  . History of kidney stones   . Irregular periods   . Nausea 01/27/2015  . Pregnancy induced hypertension   . Pregnant 01/27/2015  . Recurrent upper respiratory infection (URI)    Mostly when pregnant  . Spotting affecting pregnancy in first trimester 01/27/2015    Patient Active Problem List   Diagnosis Date Noted  . RLQ abdominal pain 01/19/2020  . Missed periods 01/19/2020  . Calculus of gallbladder without cholecystitis without obstruction   . Gestational hypertension 01/23/2016  . Transverse fetal lie 01/05/2016  . Chest pain w/ Right bundle branch block 08/08/2015  . Poor dentition 07/19/2015  . Supervision of normal pregnancy 06/21/2015  . History of gestational diabetes in prior pregnancy, currently pregnant 06/21/2015  . History of gestational hypertension 06/21/2015   . AMA (advanced maternal age) multigravida 35+ 06/21/2015  . Grand multipara 06/21/2015  . Rh negative state in antepartum period 06/21/2015  . Hematuria 05/27/2015  . Anxiety 03/05/2012    Past Surgical History:  Procedure Laterality Date  . CHOLECYSTECTOMY N/A 03/20/2019   Procedure: LAPAROSCOPIC CHOLECYSTECTOMY;  Surgeon: Franky Macho, MD;  Location: [facility];  Service: General;  Laterality: N/A;  . MULTIPLE TOOTH EXTRACTIONS    . WISDOM TOOTH EXTRACTION      OB History    Gravida  8   Para  6   Term  6   Preterm  0   AB  2   Living  6     SAB  2   IAB      Ectopic      Multiple  0   Live Births  6        Obstetric Comments  Dr. Lorie Apley Tree.          Home Medications    Prior to Admission medications   Medication Sig Start Date End Date Taking? Authorizing Provider  FLUoxetine (PROZAC) 20 MG/5ML solution Take 5 mLs (20 mg total) by mouth daily. 12/31/17   Aliene Beams, MD  fluticasone (FLONASE) 50 MCG/ACT nasal spray Place 1 spray into both nostrils daily for 14 days. 05/05/19 05/19/19  Avegno, Zachery Dakins, FNP  loratadine (CLARITIN) 10 MG tablet Take 10 mg by mouth at bedtime.     [provider]  medroxyPROGESTERone (PROVERA) 10 MG tablet Take 1 daily for 10 days 01/19/20   Adline Potter, NP    Family History Family History  Problem Relation Age of Onset  . Heart disease Maternal Grandmother   . Cancer Maternal Grandmother        brain  . Heart failure Maternal Grandmother   . Breast cancer Maternal Grandmother   . Cancer Paternal Grandmother        brain  . Hypertension Mother   . Anxiety disorder Mother   . Depression Mother   . Hypertension Father   . Anxiety disorder Sister   . ADD / ADHD Daughter   . Asthma Daughter   . ADD / ADHD Daughter   . Seizures Daughter   . ADD / ADHD Daughter   . ADD / ADHD Daughter     Social History Social History   Tobacco Use  . Smoking status: Former Smoker     Packs/day: 0.10    Years: 0.50    Pack years: 0.05    Types: Cigarettes    Start date: 02/19/1998    Quit date: 09/17/1998    Years since quitting: 21.6  . Smokeless tobacco: Never Used  Vaping Use  . Vaping Use: Never used  Substance Use Topics  . Alcohol use: No    Alcohol/week: 0.0 standard drinks    Comment: not while pregnant  . Drug use: No     Allergies   Bactrim ds [sulfamethoxazole-trimethoprim]   Review of Systems Review of Systems   Physical Exam Triage Vital Signs ED Triage Vitals  Enc Vitals Group     BP 04/26/20 1856 (!) 147/98     Pulse Rate 04/26/20 1856 (!) 118     Resp 04/26/20 1856 16     Temp 04/26/20 1856 99 F (37.2 C)     Temp Source 04/26/20 1856 Oral     SpO2 04/26/20 1856 98 %     Weight 04/26/20 1901 205 lb (93 kg)     Height --      Head Circumference --      Peak Flow --      Pain Score 04/26/20 1901 3     Pain Loc --      Pain Edu? --      Excl. in GC? --    No data found.  Updated Vital Signs BP (!) 147/98 (BP Location: Right Arm)   Pulse (!) 118   Temp 99 F (37.2 C) (Oral)   Resp 16   Wt 205 lb (93 kg)   LMP 02/20/2020 (Exact Date)   SpO2 98%   BMI 34.38 kg/m   Visual Acuity Right Eye Distance:   Left Eye Distance:   Bilateral Distance:    Right Eye Near:   Left Eye Near:    Bilateral Near:     Physical Exam Vitals and nursing note reviewed.  Constitutional:      General: She is not in acute distress.    Appearance: Normal appearance. She is well-developed and well-nourished. She is ill-appearing.  HENT:     Head: Normocephalic and atraumatic.  Eyes:     Conjunctiva/sclera: Conjunctivae normal.  Cardiovascular:     Rate and Rhythm: Normal rate and regular rhythm.     Heart sounds: Normal heart sounds. No murmur heard.   Pulmonary:     Effort: Pulmonary effort is normal. No respiratory distress.     Breath sounds: Normal breath sounds. No stridor. No wheezing, rhonchi  or rales.  Chest:     Chest  wall: No tenderness.  Abdominal:     General: Bowel sounds are normal. There is distension.     Palpations: Abdomen is soft. There is no mass.     Tenderness: There is no abdominal tenderness. There is guarding. There is no right CVA tenderness, left CVA tenderness or rebound.     Hernia: No hernia is present.  Musculoskeletal:        General: No edema. Normal range of motion.     Cervical back: Neck supple.  Skin:    General: Skin is warm and dry.     Capillary Refill: Capillary refill takes less than 2 seconds.  Neurological:     General: No focal deficit present.     Mental Status: She is alert and oriented to person, place, and time.  Psychiatric:        Mood and Affect: Mood and affect and mood normal.        Behavior: Behavior normal.        Thought Content: Thought content normal.      UC Treatments / Results  Labs (all labs ordered are listed, but only abnormal results are displayed) Labs Reviewed  POCT URINALYSIS DIP (MANUAL ENTRY) - Abnormal; Notable for the following components:      Result Value   Blood, UA large (*)    All other components within normal limits  COVID-19, FLU A+B NAA   Narrative:    Test(s) 140142-Influenza A, NAA; 140143-Influenza B, NAA was developed and its performance characteristics determined by [facility]. It has not been cleared or approved by the Food and Drug Administration. Performed at:  [address], [place], [place]  [id number] Lab Director: Jolene Schimke MD, Phone:  [phone]  POCT URINE PREGNANCY    EKG   Radiology No results found.  Procedures Procedures (including critical care time)  Medications Ordered in UC Medications - No data to display  Initial Impression / Assessment and Plan / UC Course  I have reviewed the triage vital signs and the nursing notes.  Pertinent labs & imaging results that were available during my care of the patient were reviewed by me and considered in my medical  decision making (see chart for details).     RLQ Abdominal Pain Chills Nausea Negative Pregnancy Test Covid exposure  Low suspicion of ectopic pregnancy given negative Upreg in office today UA not concerning for infection today Suspect constipation vs appendicitis Discussed with patient that we cannot rule out appendicitis in the office today Discussed that she needs more in depth workup and should go to the ER given her pain level Declines at this time States that she will go if her pain increases  Final Clinical Impressions(s) / UC Diagnoses   Final diagnoses:  Exposure to COVID-19 virus  RLQ abdominal pain  Nausea  Chills  Negative pregnancy test     Discharge Instructions     If your pain is getting worse, go to the ER for further evaluation and treatment.  You may need a CT of your abdomen and we cannot rule out appendicitis in the office    ED Prescriptions    None     PDMP not reviewed this encounter.   Moshe Cipro, NP 05/01/20 [phone]

## 2020-05-15 DIAGNOSIS — R109 Unspecified abdominal pain: Secondary | ICD-10-CM | POA: Diagnosis not present

## 2020-05-15 DIAGNOSIS — R1031 Right lower quadrant pain: Secondary | ICD-10-CM | POA: Diagnosis not present

## 2020-05-15 DIAGNOSIS — R457 State of emotional shock and stress, unspecified: Secondary | ICD-10-CM | POA: Diagnosis not present

## 2020-05-15 DIAGNOSIS — I10 Essential (primary) hypertension: Secondary | ICD-10-CM | POA: Diagnosis not present

## 2020-05-15 DIAGNOSIS — F419 Anxiety disorder, unspecified: Secondary | ICD-10-CM | POA: Diagnosis not present

## 2020-09-06 ENCOUNTER — Other Ambulatory Visit: Payer: Self-pay

## 2020-09-06 ENCOUNTER — Emergency Department (HOSPITAL_COMMUNITY)
Admission: EM | Admit: 2020-09-06 | Discharge: 2020-09-07 | Disposition: A | Discharge status: Home / Self Care | Payer: Medicaid Other | Attending: Emergency Medicine | Admitting: Emergency Medicine

## 2020-09-06 ENCOUNTER — Emergency Department (HOSPITAL_COMMUNITY): Payer: Medicaid Other

## 2020-09-06 ENCOUNTER — Encounter (HOSPITAL_COMMUNITY): Payer: Self-pay | Admitting: *Deleted

## 2020-09-06 DIAGNOSIS — Z9049 Acquired absence of other specified parts of digestive tract: Secondary | ICD-10-CM | POA: Diagnosis not present

## 2020-09-06 DIAGNOSIS — M545 Low back pain, unspecified: Secondary | ICD-10-CM | POA: Insufficient documentation

## 2020-09-06 DIAGNOSIS — R300 Dysuria: Secondary | ICD-10-CM | POA: Diagnosis present

## 2020-09-06 DIAGNOSIS — M549 Dorsalgia, unspecified: Secondary | ICD-10-CM | POA: Diagnosis not present

## 2020-09-06 DIAGNOSIS — N390 Urinary tract infection, site not specified: Secondary | ICD-10-CM | POA: Insufficient documentation

## 2020-09-06 DIAGNOSIS — K760 Fatty (change of) liver, not elsewhere classified: Secondary | ICD-10-CM | POA: Diagnosis not present

## 2020-09-06 DIAGNOSIS — M5459 Other low back pain: Secondary | ICD-10-CM | POA: Diagnosis not present

## 2020-09-06 DIAGNOSIS — B9689 Other specified bacterial agents as the cause of diseases classified elsewhere: Secondary | ICD-10-CM | POA: Insufficient documentation

## 2020-09-06 DIAGNOSIS — K3189 Other diseases of stomach and duodenum: Secondary | ICD-10-CM | POA: Diagnosis not present

## 2020-09-06 DIAGNOSIS — Z87891 Personal history of nicotine dependence: Secondary | ICD-10-CM | POA: Insufficient documentation

## 2020-09-06 LAB — URINALYSIS, ROUTINE W REFLEX MICROSCOPIC
Bilirubin Urine: NEGATIVE
Glucose, UA: NEGATIVE mg/dL
Ketones, ur: NEGATIVE mg/dL
Nitrite: NEGATIVE
Protein, ur: NEGATIVE mg/dL
Specific Gravity, Urine: 1.008 (ref 1.005–1.030)
pH: 6 (ref 5.0–8.0)

## 2020-09-06 LAB — CBC WITH DIFFERENTIAL/PLATELET
Abs Immature Granulocytes: 0.02 10*3/uL (ref 0.00–0.07)
Basophils Absolute: 0.1 10*3/uL (ref 0.0–0.1)
Basophils Relative: 1 %
Eosinophils Absolute: 0.2 10*3/uL (ref 0.0–0.5)
Eosinophils Relative: 2 %
HCT: 41.4 % (ref 36.0–46.0)
Hemoglobin: 13.3 g/dL (ref 12.0–15.0)
Immature Granulocytes: 0 %
Lymphocytes Relative: 28 %
Lymphs Abs: 2 10*3/uL (ref 0.7–4.0)
MCH: 29.9 pg (ref 26.0–34.0)
MCHC: 32.1 g/dL (ref 30.0–36.0)
MCV: 93 fL (ref 80.0–100.0)
Monocytes Absolute: 0.7 10*3/uL (ref 0.1–1.0)
Monocytes Relative: 9 %
Neutro Abs: 4.2 10*3/uL (ref 1.7–7.7)
Neutrophils Relative %: 60 %
Platelets: 324 10*3/uL (ref 150–400)
RBC: 4.45 MIL/uL (ref 3.87–5.11)
RDW: 13.7 % (ref 11.5–15.5)
WBC: 7.1 10*3/uL (ref 4.0–10.5)
nRBC: 0 % (ref 0.0–0.2)

## 2020-09-06 LAB — POC URINE PREG, ED: Preg Test, Ur: NEGATIVE

## 2020-09-06 LAB — BASIC METABOLIC PANEL
Anion gap: 9 (ref 5–15)
BUN: 13 mg/dL (ref 6–20)
CO2: 26 mmol/L (ref 22–32)
Calcium: 9.8 mg/dL (ref 8.9–10.3)
Chloride: 100 mmol/L (ref 98–111)
Creatinine, Ser: 0.64 mg/dL (ref 0.44–1.00)
GFR, Estimated: >60 mL/min (ref >60)
Glucose, Bld: 101 mg/dL — ABNORMAL HIGH (ref 70–99)
Potassium: 4.3 mmol/L (ref 3.5–5.1)
Sodium: 135 mmol/L (ref 135–145)

## 2020-09-06 LAB — PREGNANCY, URINE: Preg Test, Ur: NEGATIVE

## 2020-09-06 MED ORDER — OXYCODONE-ACETAMINOPHEN 5-325 MG PO TABS
2.0000 | ORAL_TABLET | Freq: Once | ORAL | Status: AC
  Administered 2020-09-06: 2 via ORAL
  Filled 2020-09-06: qty 2

## 2020-09-06 NOTE — ED Triage Notes (Signed)
Pt states she woke up this am with lower back pain that radiates more to the right and down to right groin; pt states she feels she has the need to urinate more often

## 2020-09-06 NOTE — ED Notes (Signed)
Patient transported to CT 

## 2020-09-07 MED ORDER — CEPHALEXIN 500 MG PO CAPS: 500.0000 mg | ORAL_CAPSULE | Freq: Four times a day (QID) | ORAL | 0 refills | Status: DC

## 2020-09-07 MED ORDER — CEPHALEXIN 500 MG PO CAPS
1000.0000 mg | ORAL_CAPSULE | Freq: Once | ORAL | Status: AC
  Administered 2020-09-07: 1000 mg via ORAL
  Filled 2020-09-07: qty 2

## 2020-09-07 MED ORDER — OXYCODONE-ACETAMINOPHEN 5-325 MG PO TABS: 2.0000 | ORAL_TABLET | ORAL | 0 refills | Status: DC | PRN

## 2020-09-07 NOTE — ED Provider Notes (Signed)
[facility] EMERGENCY DEPARTMENT Provider Note   CSN: [id number] Arrival date & time: 09/06/20  2056     History Chief Complaint  Patient presents with  . Back Pain    Right side    [name] is a 40 y.o. female.   Back Pain Location:  Generalized Quality:  Aching Radiates to:  Does not radiate Pain severity:  Mild Onset quality:  Gradual Duration:  2 days Timing:  Constant Progression:  Worsening Associated symptoms: dysuria   Risk factors: no hx of cancer, no recent surgery, no steroid use and no vascular disease   Dysuria Pain quality:  Sharp and stabbing Pain severity:  Moderate Timing:  Intermittent Progression:  Waxing and waning Chronicity:  Recurrent Recent urinary tract infections: no   Relieved by:  None tried Worsened by:  Nothing Ineffective treatments:  None tried      Past Medical History:  Diagnosis Date  . Anemia   . Anxiety "fast hearbeat"   took Benadryl at end of pregnancy  . Back pain affecting pregnancy in first trimester 05/27/2015  . Complication of anesthesia    Epidural 1 sided  . Depression    During 1 pregnancy  . Diabetes mellitus without complication (HCC)   . Endometriosis   . Gallstones   . Gestational diabetes    just started checking BS 03/05/12  . Headache(784.0)   . Hematuria 05/27/2015  . History of kidney stones   . Irregular periods   . Nausea 01/27/2015  . Pregnancy induced hypertension   . Pregnant 01/27/2015  . Recurrent upper respiratory infection (URI)    Mostly when pregnant  . Spotting affecting pregnancy in first trimester 01/27/2015    Patient Active Problem List   Diagnosis Date Noted  . RLQ abdominal pain 01/19/2020  . Missed periods 01/19/2020  . Calculus of gallbladder without cholecystitis without obstruction   . Gestational hypertension 01/23/2016  . Transverse fetal lie 01/05/2016  . Chest pain w/ Right bundle branch block 08/08/2015  . Poor dentition 07/19/2015  . Supervision of  normal pregnancy 06/21/2015  . History of gestational diabetes in prior pregnancy, currently pregnant 06/21/2015  . History of gestational hypertension 06/21/2015  . AMA (advanced maternal age) multigravida 35+ 06/21/2015  . Grand multipara 06/21/2015  . Rh negative state in antepartum period 06/21/2015  . Hematuria 05/27/2015  . Anxiety 03/05/2012    Past Surgical History:  Procedure Laterality Date  . CHOLECYSTECTOMY N/A 03/20/2019   Procedure: LAPAROSCOPIC CHOLECYSTECTOMY;  Surgeon: Franky Macho, MD;  Location: [facility];  Service: General;  Laterality: N/A;  . MULTIPLE TOOTH EXTRACTIONS    . WISDOM TOOTH EXTRACTION       OB History    Gravida  8   Para  6   Term  6   Preterm  0   AB  2   Living  6     SAB  2   IAB      Ectopic      Multiple  0   Live Births  6        Obstetric Comments  Dr. Lorie Apley Tree.         Family History  Problem Relation Age of Onset  . Heart disease Maternal Grandmother   . Cancer Maternal Grandmother        brain  . Heart failure Maternal Grandmother   . Breast cancer Maternal Grandmother   . Cancer Paternal Grandmother        brain  .  Hypertension Mother   . Anxiety disorder Mother   . Depression Mother   . Hypertension Father   . Anxiety disorder Sister   . ADD / ADHD Daughter   . Asthma Daughter   . ADD / ADHD Daughter   . Seizures Daughter   . ADD / ADHD Daughter   . ADD / ADHD Daughter     Social History   Tobacco Use  . Smoking status: Former Smoker    Packs/day: 0.10    Years: 0.50    Pack years: 0.05    Types: Cigarettes    Start date: 02/19/1998    Quit date: 09/17/1998    Years since quitting: 21.9  . Smokeless tobacco: Never Used  Vaping Use  . Vaping Use: Never used  Substance Use Topics  . Alcohol use: No    Alcohol/week: 0.0 standard drinks    Comment: not while pregnant  . Drug use: No    Home Medications Prior to Admission medications   Medication Sig Start Date End Date  Taking? Authorizing Provider  cephALEXin (KEFLEX) 500 MG capsule Take 1 capsule (500 mg total) by mouth 4 (four) times daily. 09/07/20  Yes Darean Rote, Barbara Cower, MD  oxyCODONE-acetaminophen (PERCOCET) 5-325 MG tablet Take 2 tablets by mouth every 4 (four) hours as needed. 09/07/20  Yes Tranika Scholler, Barbara Cower, MD  FLUoxetine (PROZAC) 20 MG/5ML solution Take 5 mLs (20 mg total) by mouth daily. 12/31/17   Aliene Beams, MD  fluticasone (FLONASE) 50 MCG/ACT nasal spray Place 1 spray into both nostrils daily for 14 days. 05/05/19 05/19/19  Avegno, Zachery Dakins, FNP  loratadine (CLARITIN) 10 MG tablet Take 10 mg by mouth at bedtime.     [provider]  medroxyPROGESTERone (PROVERA) 10 MG tablet Take 1 daily for 10 days 01/19/20   Adline Potter, NP    Allergies    Bactrim ds [sulfamethoxazole-trimethoprim]  Review of Systems   Review of Systems  Genitourinary: Positive for dysuria.  Musculoskeletal: Positive for back pain.  All other systems reviewed and are negative.   Physical Exam Updated Vital Signs BP (!) 146/92   Pulse 88   Temp 98.6 F (37 C) (Oral)   Resp 18   Ht 5\' 1"  (1.549 m)   Wt 93 kg   LMP 06/18/2020   SpO2 100%   BMI 38.73 kg/m   Physical Exam Vitals and nursing note reviewed.  Constitutional:      Appearance: She is well-developed.  HENT:     Head: Normocephalic and atraumatic.     Mouth/Throat:     Mouth: Mucous membranes are moist.     Pharynx: Oropharynx is clear.  Eyes:     Pupils: Pupils are equal, round, and reactive to light.  Cardiovascular:     Rate and Rhythm: Normal rate and regular rhythm.  Pulmonary:     Effort: No respiratory distress.     Breath sounds: No stridor.  Abdominal:     General: Abdomen is flat. There is no distension.     Tenderness: There is no abdominal tenderness. There is no guarding or rebound.     Hernia: No hernia is present.  Musculoskeletal:        General: No swelling or tenderness. Normal range of motion.     Cervical back:  Normal range of motion.  Skin:    General: Skin is warm and dry.  Neurological:     General: No focal deficit present.     Mental Status: She is alert.  ED Results / Procedures / Treatments   Labs (all labs ordered are listed, but only abnormal results are displayed) Labs Reviewed  URINALYSIS, ROUTINE W REFLEX MICROSCOPIC - Abnormal; Notable for the following components:      Result Value   Color, Urine STRAW (*)    APPearance HAZY (*)    Hgb urine dipstick SMALL (*)    Leukocytes,Ua MODERATE (*)    Bacteria, UA MANY (*)    All other components within normal limits  BASIC METABOLIC PANEL - Abnormal; Notable for the following components:   Glucose, Bld 101 (*)    All other components within normal limits  PREGNANCY, URINE  CBC WITH DIFFERENTIAL/PLATELET  POC URINE PREG, ED    EKG None  Radiology CT Renal Stone Study  Result Date: 09/06/2020 CLINICAL DATA:  Flank pain right-sided back pain EXAM: CT ABDOMEN AND PELVIS WITHOUT CONTRAST TECHNIQUE: Multidetector CT imaging of the abdomen and pelvis was performed following the standard protocol without IV contrast. COMPARISON:  CT 02/04/2020 FINDINGS: Lower chest: Lung bases demonstrate no acute consolidation or pleural effusion. Normal cardiac size. Hepatobiliary: Hepatic steatosis. Status post cholecystectomy. No biliary dilatation Pancreas: Unremarkable. No pancreatic ductal dilatation or surrounding inflammatory changes. Spleen: Normal in size without focal abnormality. Adrenals/Urinary Tract: Adrenal glands are unremarkable. Kidneys are normal, without renal calculi, focal lesion, or hydronephrosis. Bladder is unremarkable. Stomach/Bowel: The stomach is nonenlarged. No dilated small bowel. Borderline to slightly enlarged appendix up to 8 mm but no definitive surrounding inflammation. Vascular/Lymphatic: Nonaneurysmal aorta.  No suspicious nodes. Reproductive: Uterus and bilateral adnexa are unremarkable. Other: Negative for free  air or free fluid. Musculoskeletal: No acute or significant osseous findings. IMPRESSION: 1. Negative for hydronephrosis or urinary tract calculus. 2. Borderline to slightly enlarged appendix up to 8 mm but no additional inflammatory changes to strongly suggest appendicitis. 3. Hepatic steatosis Electronically Signed   By: Jasmine Pang M.D.   On: 09/06/2020 23:55    Procedures Procedures   Medications Ordered in ED Medications  oxyCODONE-acetaminophen (PERCOCET/ROXICET) 5-325 MG per tablet 2 tablet (2 tablets Oral Given 09/06/20 2334)  cephALEXin (KEFLEX) capsule 1,000 mg (1,000 mg Oral Given 09/07/20 0117)    ED Course  I have reviewed the triage vital signs and the nursing notes.  Pertinent labs & imaging results that were available during my care of the patient were reviewed by me and considered in my medical decision making (see chart for details).    MDM Rules/Calculators/A&P                          Likely UTI (maybe early pyelo) as cause for symptoms. CT read with enlarged appendix but no local findings of inflammation. No fever. No leukocytosis. No anorexia. No vomiting. No significant constipation or other GI symptoms. D/w patient that UTI more likely but if not improving or symptoms worsening or new symptoms may need reevaluation in ED, otherwise PCP follow up.   Final Clinical Impression(s) / ED Diagnoses Final diagnoses:  Acute low back pain without sciatica, unspecified back pain laterality  Urinary tract infection without hematuria, site unspecified    Rx / DC Orders ED Discharge Orders         Ordered    cephALEXin (KEFLEX) 500 MG capsule  4 times daily        09/07/20 0129    oxyCODONE-acetaminophen (PERCOCET) 5-325 MG tablet  Every 4 hours PRN        09/07/20 0129  Damiya Sandefur, Barbara CowerJason, MD 09/07/20 [phone]

## 2020-09-29 ENCOUNTER — Encounter (HOSPITAL_COMMUNITY): Payer: Self-pay

## 2020-09-29 ENCOUNTER — Other Ambulatory Visit: Payer: Self-pay

## 2020-09-29 ENCOUNTER — Emergency Department (HOSPITAL_COMMUNITY)
Admission: EM | Admit: 2020-09-29 | Discharge: 2020-09-30 | Disposition: A | Discharge status: Home / Self Care | Payer: Medicaid Other | Source: Home / Self Care | Attending: Emergency Medicine | Admitting: Emergency Medicine

## 2020-09-29 DIAGNOSIS — Y900 Blood alcohol level of less than 20 mg/100 ml: Secondary | ICD-10-CM | POA: Insufficient documentation

## 2020-09-29 DIAGNOSIS — R45851 Suicidal ideations: Secondary | ICD-10-CM | POA: Insufficient documentation

## 2020-09-29 DIAGNOSIS — E119 Type 2 diabetes mellitus without complications: Secondary | ICD-10-CM | POA: Insufficient documentation

## 2020-09-29 DIAGNOSIS — Z20822 Contact with and (suspected) exposure to covid-19: Secondary | ICD-10-CM | POA: Insufficient documentation

## 2020-09-29 DIAGNOSIS — Z87891 Personal history of nicotine dependence: Secondary | ICD-10-CM | POA: Insufficient documentation

## 2020-09-29 DIAGNOSIS — F332 Major depressive disorder, recurrent severe without psychotic features: Secondary | ICD-10-CM | POA: Insufficient documentation

## 2020-09-29 LAB — RAPID URINE DRUG SCREEN, HOSP PERFORMED
Amphetamines: NOT DETECTED
Barbiturates: NOT DETECTED
Benzodiazepines: NOT DETECTED
Cocaine: NOT DETECTED
Opiates: NOT DETECTED
Tetrahydrocannabinol: NOT DETECTED

## 2020-09-29 LAB — COMPREHENSIVE METABOLIC PANEL
ALT: 37 U/L (ref 0–44)
AST: 26 U/L (ref 15–41)
Albumin: 4.1 g/dL (ref 3.5–5.0)
Alkaline Phosphatase: 46 U/L (ref 38–126)
Anion gap: 9 (ref 5–15)
BUN: 11 mg/dL (ref 6–20)
CO2: 25 mmol/L (ref 22–32)
Calcium: 9.5 mg/dL (ref 8.9–10.3)
Chloride: 103 mmol/L (ref 98–111)
Creatinine, Ser: 0.59 mg/dL (ref 0.44–1.00)
GFR, Estimated: >60 mL/min (ref >60)
Glucose, Bld: 104 mg/dL — ABNORMAL HIGH (ref 70–99)
Potassium: 4.2 mmol/L (ref 3.5–5.1)
Sodium: 137 mmol/L (ref 135–145)
Total Bilirubin: 0.5 mg/dL (ref 0.3–1.2)
Total Protein: 7.7 g/dL (ref 6.5–8.1)

## 2020-09-29 LAB — CBC
HCT: 39.8 % (ref 36.0–46.0)
Hemoglobin: 12.8 g/dL (ref 12.0–15.0)
MCH: 29.9 pg (ref 26.0–34.0)
MCHC: 32.2 g/dL (ref 30.0–36.0)
MCV: 93 fL (ref 80.0–100.0)
Platelets: 293 10*3/uL (ref 150–400)
RBC: 4.28 MIL/uL (ref 3.87–5.11)
RDW: 14.1 % (ref 11.5–15.5)
WBC: 7.6 10*3/uL (ref 4.0–10.5)
nRBC: 0 % (ref 0.0–0.2)

## 2020-09-29 LAB — ETHANOL: Alcohol, Ethyl (B): <10 mg/dL (ref <10)

## 2020-09-29 LAB — POC URINE PREG, ED: Preg Test, Ur: NEGATIVE

## 2020-09-29 LAB — ACETAMINOPHEN LEVEL: Acetaminophen (Tylenol), Serum: <10 ug/mL — ABNORMAL LOW (ref 10–30)

## 2020-09-29 LAB — SALICYLATE LEVEL: Salicylate Lvl: <7 mg/dL — ABNORMAL LOW (ref 7.0–30.0)
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
## 2020-09-29 NOTE — ED Provider Notes (Signed)
[facility] EMERGENCY DEPARTMENT Provider Note   CSN: [id number] Arrival date & time: 09/29/20  1954     History Chief Complaint  Patient presents with   IVC    [name] is a 40 y.o. female.  HPI She presents by law enforcement under commitment for suicidal ideation, petitioned by mother.  Patient reports she has been arguing with her ex-boyfriend who is a father of her young child.  Today the patient made a threat of suicide by overdosing on fentanyl.  She told her mother that she was feeling worthless and felt that she was ugly.  Patient does not currently see a therapist although has seen a therapist in the past.  She states that she was upset earlier which is why she said those things, and is not currently feeling suicidal.  She does not have an active suicidal plan.  She denies recent illnesses including fever, vomiting or dizziness.  There are no other known active modifying factors.  Past Medical History:  Diagnosis Date   Anemia    Anxiety "fast hearbeat"   took Benadryl at end of pregnancy   Back pain affecting pregnancy in first trimester 05/27/2015   Complication of anesthesia    Epidural 1 sided   Depression    During 1 pregnancy   Diabetes mellitus without complication (HCC)    Endometriosis    Gallstones    Gestational diabetes    just started checking BS 03/05/12   Headache(784.0)    Hematuria 05/27/2015   History of kidney stones    Irregular periods    Nausea 01/27/2015   Pregnancy induced hypertension    Pregnant 01/27/2015   Recurrent upper respiratory infection (URI)    Mostly when pregnant   Spotting affecting pregnancy in first trimester 01/27/2015    Patient Active Problem List   Diagnosis Date Noted   RLQ abdominal pain 01/19/2020   Missed periods 01/19/2020   Calculus of gallbladder without cholecystitis without obstruction 05/31/2019   Gestational hypertension 01/23/2016   Transverse fetal lie 01/05/2016   Chest pain w/ Right bundle  branch block 08/08/2015   Poor dentition 07/19/2015   Supervision of normal pregnancy 06/21/2015   History of gestational diabetes in prior pregnancy, currently pregnant 06/21/2015   History of gestational hypertension 06/21/2015   AMA (advanced maternal age) multigravida 35+ 06/21/2015   Grand multipara 06/21/2015   Rh negative state in antepartum period 06/21/2015   Hematuria 05/27/2015   Anxiety 03/05/2012    Past Surgical History:  Procedure Laterality Date   CHOLECYSTECTOMY N/A 03/20/2019   Procedure: LAPAROSCOPIC CHOLECYSTECTOMY;  Surgeon: Franky Macho, MD;  Location: [facility];  Service: General;  Laterality: N/A;   MULTIPLE TOOTH EXTRACTIONS     WISDOM TOOTH EXTRACTION       OB History     Gravida  8   Para  6   Term  6   Preterm  0   AB  2   Living  6      SAB  2   IAB      Ectopic      Multiple  0   Live Births  6        Obstetric Comments  Dr. Lorie Apley Tree.          Family History  Problem Relation Age of Onset   Heart disease Maternal Grandmother    Cancer Maternal Grandmother        brain   Heart failure Maternal Grandmother  Breast cancer Maternal Grandmother    Cancer Paternal Grandmother        brain   Hypertension Mother    Anxiety disorder Mother    Depression Mother    Hypertension Father    Anxiety disorder Sister    ADD / ADHD Daughter    Asthma Daughter    ADD / ADHD Daughter    Seizures Daughter    ADD / ADHD Daughter    ADD / ADHD Daughter     Social History   Tobacco Use   Smoking status: Former    Packs/day: 0.10    Years: 0.50    Pack years: 0.05    Types: Cigarettes    Start date: 02/19/1998    Quit date: 09/17/1998    Years since quitting: 22.0   Smokeless tobacco: Never  Vaping Use   Vaping Use: Never used  Substance Use Topics   Alcohol use: No    Alcohol/week: 0.0 standard drinks    Comment: not while pregnant   Drug use: No    Home Medications Prior to Admission medications    Medication Sig Start Date End Date Taking? Authorizing Provider  cephALEXin (KEFLEX) 500 MG capsule Take 1 capsule (500 mg total) by mouth 4 (four) times daily. 09/07/20   Mesner, Barbara Cower, MD  FLUoxetine (PROZAC) 20 MG/5ML solution Take 5 mLs (20 mg total) by mouth daily. 12/31/17   Aliene Beams, MD  fluticasone (FLONASE) 50 MCG/ACT nasal spray Place 1 spray into both nostrils daily for 14 days. 05/05/19 05/19/19  Avegno, Zachery Dakins, FNP  loratadine (CLARITIN) 10 MG tablet Take 10 mg by mouth at bedtime.     [provider]  medroxyPROGESTERone (PROVERA) 10 MG tablet Take 1 daily for 10 days 01/19/20   Adline Potter, NP  oxyCODONE-acetaminophen (PERCOCET) 5-325 MG tablet Take 2 tablets by mouth every 4 (four) hours as needed. 09/07/20   Mesner, Barbara Cower, MD    Allergies    Sulfamethoxazole-trimethoprim  Review of Systems   Review of Systems  All other systems reviewed and are negative.  Physical Exam Updated Vital Signs BP (!) 169/97 (BP Location: Right Arm)   Pulse 93   Temp 98.6 F (37 C) (Oral)   Resp 18   Ht 5\' 2"  (1.575 m)   Wt 95.3 kg   SpO2 99%   BMI 38.41 kg/m   Physical Exam Vitals and nursing note reviewed.  Constitutional:      General: She is not in acute distress.    Appearance: She is well-developed. She is not ill-appearing, toxic-appearing or diaphoretic.  HENT:     Head: Normocephalic and atraumatic.  Eyes:     Conjunctiva/sclera: Conjunctivae normal.     Pupils: Pupils are equal, round, and reactive to light.  Neck:     Trachea: Phonation normal.  Cardiovascular:     Rate and Rhythm: Normal rate.  Pulmonary:     Effort: Pulmonary effort is normal.  Abdominal:     General: There is no distension.     Palpations: Abdomen is soft.  Musculoskeletal:        General: Normal range of motion.     Cervical back: Normal range of motion and neck supple.  Skin:    General: Skin is warm and dry.  Neurological:     Mental Status: She is alert and  oriented to person, place, and time.     Motor: No abnormal muscle tone.  Psychiatric:  Mood and Affect: Mood normal.        Behavior: Behavior normal.        Thought Content: Thought content normal.        Judgment: Judgment normal.    ED Results / Procedures / Treatments   Labs (all labs ordered are listed, but only abnormal results are displayed) Labs Reviewed  COMPREHENSIVE METABOLIC PANEL - Abnormal; Notable for the following components:      Result Value   Glucose, Bld 104 (*)    All other components within normal limits  SALICYLATE LEVEL - Abnormal; Notable for the following components:   Salicylate Lvl <7.0 (*)    All other components within normal limits  ACETAMINOPHEN LEVEL - Abnormal; Notable for the following components:   Acetaminophen (Tylenol), Serum <10 (*)    All other components within normal limits  ETHANOL  CBC  RAPID URINE DRUG SCREEN, HOSP PERFORMED  POC URINE PREG, ED    EKG None  Radiology No results found.  Procedures Procedures   Medications Ordered in ED Medications - No data to display  ED Course  I have reviewed the triage vital signs and the nursing notes.  Pertinent labs & imaging results that were available during my care of the patient were reviewed by me and considered in my medical decision making (see chart for details).  Clinical Course as of 09/30/20 0039  Thu Sep 29, 2020  2307 At this time the patient is medically cleared for evaluation by TTS. [EW]    Clinical Course User Index [EW] Mancel Bale, MD   MDM Rules/Calculators/A&P                           Patient Vitals for the past 24 hrs:  BP Temp Temp src Pulse Resp SpO2 Height Weight  09/29/20 2026 -- -- -- -- -- -- 5\' 2"  (1.575 m) 95.3 kg  09/29/20 2025 (!) 169/97 98.6 F (37 C) Oral 93 18 99 % -- --     Medical Decision Making:  This patient is presenting for evaluation of suicidal ideation under involuntary commitment, which does require a range of  treatment options, and is a complaint that involves a moderate risk of morbidity and mortality. The differential diagnoses include behavioral outburst, suicidal ideation, psychosocial stressors. I decided to review old records, and in summary middle-aged female, presenting with suicidal ideation, after making statements to her mother.  Her mother petitioned..  I did not require additional historical information from anyone.  Clinical Laboratory Tests Ordered, included CBC, Metabolic panel, and UDS, alcohol level .  Tylenol and aspirin levels.  Review indicates normal finding.   Critical Interventions-clinical evaluation, laboratory testing, TTS consultation.  After These Interventions, the Patient was reevaluated and was found with suicidal ideation, onset when arguing with ex-boyfriend.  Patient spontaneously improved.  Patient referred to talk to TTS, for additional evaluation and disposition assistance.  CRITICAL CARE-no Performed by: 10/01/20  Nursing Notes Reviewed/ Care Coordinated Applicable Imaging Reviewed Interpretation of Laboratory Data incorporated into ED treatment  12:30 AM-she has been seen by TTS who plan on admitting her psychiatrically.  I have completed the first examination form to uphold the petition for commitment.  Home meds have been placed.   Final Clinical Impression(s) / ED Diagnoses Final diagnoses:  Suicidal ideation    Rx / DC Orders ED Discharge Orders     None        Mancel Bale, MD  09/30/20 0039  

## 2020-09-29 NOTE — ED Notes (Signed)
Pt 's clothes and cell phone placed in locker and also 2 rings and a necklace yellow in color given to security to lock in safe at this time.[name]

## 2020-09-30 ENCOUNTER — Encounter (HOSPITAL_COMMUNITY): Payer: Self-pay

## 2020-09-30 ENCOUNTER — Inpatient Hospital Stay (HOSPITAL_COMMUNITY)
Admission: AD | Admit: 2020-09-30 | Discharge: 2020-10-03 | DRG: 882 | Disposition: A | Discharge status: Home / Self Care | Payer: Medicaid Other | Source: Intra-hospital | Attending: Psychiatry | Admitting: Psychiatry

## 2020-09-30 ENCOUNTER — Encounter (HOSPITAL_COMMUNITY): Payer: Self-pay | Admitting: Student

## 2020-09-30 DIAGNOSIS — F332 Major depressive disorder, recurrent severe without psychotic features: Secondary | ICD-10-CM | POA: Diagnosis not present

## 2020-09-30 DIAGNOSIS — F4010 Social phobia, unspecified: Secondary | ICD-10-CM | POA: Diagnosis present

## 2020-09-30 DIAGNOSIS — Z881 Allergy status to other antibiotic agents status: Secondary | ICD-10-CM

## 2020-09-30 DIAGNOSIS — Z20822 Contact with and (suspected) exposure to covid-19: Secondary | ICD-10-CM | POA: Diagnosis present

## 2020-09-30 DIAGNOSIS — R45851 Suicidal ideations: Secondary | ICD-10-CM | POA: Diagnosis present

## 2020-09-30 DIAGNOSIS — Z818 Family history of other mental and behavioral disorders: Secondary | ICD-10-CM | POA: Diagnosis not present

## 2020-09-30 DIAGNOSIS — F410 Panic disorder [episodic paroxysmal anxiety] without agoraphobia: Secondary | ICD-10-CM | POA: Diagnosis present

## 2020-09-30 DIAGNOSIS — F4323 Adjustment disorder with mixed anxiety and depressed mood: Principal | ICD-10-CM | POA: Diagnosis present

## 2020-09-30 DIAGNOSIS — Z87891 Personal history of nicotine dependence: Secondary | ICD-10-CM | POA: Diagnosis not present

## 2020-09-30 LAB — RESP PANEL BY RT-PCR (FLU A&B, COVID) ARPGX2
Influenza A by PCR: NEGATIVE
Influenza B by PCR: NEGATIVE
SARS Coronavirus 2 by RT PCR: NEGATIVE

## 2020-09-30 LAB — LIPID PANEL
Cholesterol: 188 mg/dL (ref 0–200)
HDL: 53 mg/dL (ref >40)
LDL Cholesterol: 99 mg/dL (ref 0–99)
Total CHOL/HDL Ratio: 3.5 ratio
Triglycerides: 181 mg/dL — ABNORMAL HIGH (ref <150)
VLDL: 36 mg/dL (ref 0–40)

## 2020-09-30 LAB — HEMOGLOBIN A1C
Hgb A1c MFr Bld: 5.4 % (ref 4.8–5.6)
Mean Plasma Glucose: 108.28 mg/dL

## 2020-09-30 LAB — TSH: TSH: 3.327 u[IU]/mL (ref 0.350–4.500)

## 2020-09-30 LAB — T4, FREE: Free T4: 0.53 ng/dL — ABNORMAL LOW (ref 0.61–1.12)

## 2020-09-30 MED ORDER — FLUOXETINE HCL 20 MG/5ML PO SOLN
20.0000 mg | Freq: Every day | ORAL | Status: DC
  Filled 2020-09-30 (×2): qty 5

## 2020-09-30 MED ORDER — LORATADINE 10 MG PO TABS: 10.0000 mg | ORAL_TABLET | Freq: Every evening | ORAL | Status: DC | PRN

## 2020-09-30 MED ORDER — ALUM & MAG HYDROXIDE-SIMETH 200-200-20 MG/5ML PO SUSP: 30.0000 mL | ORAL | Status: DC | PRN

## 2020-09-30 MED ORDER — MAGNESIUM HYDROXIDE 400 MG/5ML PO SUSP: 30.0000 mL | Freq: Every day | ORAL | Status: DC | PRN

## 2020-09-30 MED ORDER — FLUOXETINE HCL 20 MG/5ML PO SOLN
40.0000 mg | Freq: Every day | ORAL | Status: DC
  Administered 2020-09-30 – 2020-10-03 (×4): 40 mg via ORAL
  Filled 2020-09-30 (×6): qty 10

## 2020-09-30 MED ORDER — HYDROXYZINE HCL 25 MG PO TABS: 25.0000 mg | ORAL_TABLET | Freq: Three times a day (TID) | ORAL | Status: DC | PRN

## 2020-09-30 MED ORDER — TRAZODONE HCL 50 MG PO TABS: 50.0000 mg | ORAL_TABLET | Freq: Every evening | ORAL | Status: DC | PRN

## 2020-09-30 MED ORDER — ACETAMINOPHEN 325 MG PO TABS: 650.0000 mg | ORAL_TABLET | Freq: Four times a day (QID) | ORAL | Status: DC | PRN

## 2020-09-30 MED ORDER — LORATADINE 10 MG PO TABS: 10.0000 mg | ORAL_TABLET | Freq: Every day | ORAL | Status: DC

## 2020-09-30 MED ORDER — AZELASTINE HCL 0.1 % NA SOLN
1.0000 | Freq: Two times a day (BID) | NASAL | Status: DC | PRN
  Administered 2020-09-30 – 2020-10-02 (×3): 1 via NASAL
  Filled 2020-09-30: qty 30

## 2020-09-30 MED ORDER — FLUOXETINE HCL 20 MG/5ML PO SOLN: 20.0000 mg | Freq: Every day | ORAL | Status: DC

## 2020-09-30 MED ORDER — LORATADINE 10 MG PO TABS
10.0000 mg | ORAL_TABLET | Freq: Every day | ORAL | Status: DC
  Filled 2020-09-30: qty 1

## 2020-09-30 NOTE — Progress Notes (Signed)
Adult Psychoeducational Group Note  Date:  09/30/2020 Time:  10:41 AM  Group Topic/Focus:  Goals Group:   The focus of this group is to help patients establish daily goals to achieve during treatment and discuss how the patient can incorporate goal setting into their daily lives to aide in recovery.  Participation Level:  Active  Participation Quality:  Appropriate  Affect:  Appropriate  Cognitive:  Appropriate  Insight: Appropriate  Engagement in Group:  Engaged  Modes of Intervention:  Discussion  Additional Comments:  Pt stated his goal for the day is to talk to doctors.  Wynema Birch D 09/30/2020, 10:41 AM

## 2020-09-30 NOTE — ED Provider Notes (Signed)
TTS consultation is appreciated.  Inpatient treatment is recommended.  Attempts are being made for appropriate placement.   Dione Booze, MD 09/30/20 [phone]
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
## 2020-09-30 NOTE — BH Assessment (Addendum)
Clinician spoke with [facility] Fransico Michael.  Pt has been accepted to [facility] [id number] to Dr. Jola Babinski.  This is panding a negative COVID result.

## 2020-09-30 NOTE — H&P (Signed)
Psychiatric Admission Assessment Adult  Patient Identification: [name] MRN:  [id number] Date of Evaluation:  09/30/2020 Chief Complaint:  MDD (major depressive disorder), recurrent severe, without psychosis ([place]) [F33.2] Principal Diagnosis: Adjustment disorder with mixed anxiety and depressed mood Diagnosis:  Principal Problem:   Adjustment disorder with mixed anxiety and depressed mood Active Problems:   MDD (major depressive disorder), recurrent severe, without psychosis ([place])   Panic attacks   Social anxiety disorder  History of Present Illness: Medical record reviewed.  I met with and evaluated the patient on the unit today.  [name] is a 40 year old female with self-reported history of anxiety and panic attacks who presented to [facility] BIB [place] police under IVC taken out by her mother for making suicidal threats to overdose on fentanyl after having an argument with her ex-boyfriend who is the father of one of her children.  BAL was negative.  UDS was negative.  Per ED notes, collateral information obtained from mother indicates that patient has been depressed for a long time, has made statements about being tired of living and made statements about killing herself a few months ago.  Mother stated patient would likely minimize symptoms when speaking with ED staff.  Patient was transferred this morning to [facility] for further evaluation and treatment and safety of patient.  On interview with me this morning, the patient is cooperative and polite, makes good eye contact and presents with constricted affect.  She perseverates on conflict with her ex boyfriend of 5-1/2 years who is the father of one of her children.  Their interactions worsened several days ago and patient felt sad, hurt, mad and bad about herself.  Patient states she became tearful and said something about overdosing on fentanyl because her sister's boyfriend overdosed on fentanyl.  Patient denies access to  fentanyl.  She denies suicidal ideation or passive wish for death this morning and states she would never kill herself because of her children.  She does report significant negative feelings about herself and feelings of worthlessness which are exacerbated when she has problematic interactions with her ex-boyfriend.  Patient denies persistently depressed mood, anhedonia, change in sleep, change in appetite, problems with energy, problems with concentration, AI, HI, PI, AH or VH.  She reports ongoing problems with anxiety that started when she was in high school.  Patient reports being picked on in high school and having anxiety when she had to speak in class.  She feels nervous when she is around groups of people and worries how they will perceive her.  Patient experiences panic attacks with increased heart rate, racing heart, shortness of breath, feeling jittery and nervous.  Patient has had intermittent treatment for anxiety with SSRIs since 2015 or 2016.  Most recently she has been taking Prozac 30m daily for anxiety [facility] WBarbra Sarks.  She finds Prozac to be helpful, her anxiety is less intense but she continues to experience anxiety intermittently.  Patient does not have a current therapist although she had a therapist in the past and found therapy to be helpful.  She is receptive to increasing her Prozac dose to target symptoms of anxiety and possible depressive symptoms.  She also is receptive to participating in therapy as an outpatient.  Patient denies use of alcohol, tobacco, marijuana or any drugs.  Patient has seasonal allergies.  She had a cholecystectomy.  Patient denies any history of seizure.  Her other medical problems.  She has an  allergy to Bactrim.  Associated Signs/Symptoms: Depression Symptoms:  feelings of worthlessness/guilt, suicidal thoughts with specific plan, anxiety, panic attacks, Duration of Depression Symptoms:  Greater than two weeks  (Hypo) Manic Symptoms:   denies Anxiety Symptoms:  Panic Symptoms, Social Anxiety, Psychotic Symptoms:   denies PTSD Symptoms: Negative Total Time spent with patient:  50 minutes  Past Psychiatric History: Patient has a history of social anxiety disorder, performance anxiety and panic attacks.  She has received intermittent treatment for anxiety with SSRIs since 2015 or 2016.  She has taken Celexa in the past but most recently has been treated with Prozac 20 mg daily prescribed by her primary care provider.  She does not have a current therapist or psychiatrist.  Patient denies any history of prior inpatient psychiatric hospitalizations.  She denies any history of suicide attempts or nonsuicidal self-injurious behavior.  She reports making suicidal statements in the past but has never taken action on them.  Is the patient at risk to self? Yes.    Has the patient been a risk to self in the past 6 months? Yes.    Has the patient been a risk to self within the distant past? No.  Is the patient a risk to others? No.  Has the patient been a risk to others in the past 6 months? No.  Has the patient been a risk to others within the distant past? No.   Prior Inpatient Therapy:   Prior Outpatient Therapy:    Alcohol Screening: 1. How often do you have a drink containing alcohol?: Monthly or less 2. How many drinks containing alcohol do you have on a typical day when you are drinking?: 1 or 2 3. How often do you have six or more drinks on one occasion?: Never AUDIT-C Score: 1 4. How often during the last year have you found that you were not able to stop drinking once you had started?: Never 5. How often during the last year have you failed to do what was normally expected from you because of drinking?: Never 6. How often during the last year have you needed a first drink in the morning to get yourself going after a heavy drinking session?: Never 7. How often during the  last year have you had a feeling of guilt of remorse after drinking?: Never 8. How often during the last year have you been unable to remember what happened the night before because you had been drinking?: Never 9. Have you or someone else been injured as a result of your drinking?: No 10. Has a relative or friend or a doctor or another health worker been concerned about your drinking or suggested you cut down?: No Alcohol Use Disorder Identification Test Final Score (AUDIT): 1 Substance Abuse History in the last 12 months:  No. Consequences of Substance Abuse: NA Previous Psychotropic Medications: Yes  Psychological Evaluations: Yes  Past Medical History:  Past Medical History:  Diagnosis Date   Anemia    Anxiety "fast hearbeat"   took Benadryl at end of pregnancy   Back pain affecting pregnancy in first trimester 04/17/3233   Complication of anesthesia    Epidural 1 sided   Depression    During 1 pregnancy   Diabetes mellitus without complication ([place])    Endometriosis    Gallstones    Gestational diabetes    just started checking BS 03/05/12   Headache(784.0)    Hematuria 05/27/2015   History of kidney stones    Irregular periods  Nausea 01/27/2015   Pregnancy induced hypertension    Pregnant 01/27/2015   Recurrent upper respiratory infection (URI)    Mostly when pregnant   Spotting affecting pregnancy in first trimester 01/27/2015    Past Surgical History:  Procedure Laterality Date   CHOLECYSTECTOMY N/A 03/20/2019   Procedure: LAPAROSCOPIC CHOLECYSTECTOMY;  Surgeon: Aviva Signs, MD;  Location: [facility];  Service: General;  Laterality: N/A;   MULTIPLE TOOTH EXTRACTIONS     WISDOM TOOTH EXTRACTION     Family History:  Family History  Problem Relation Age of Onset   Heart disease Maternal Grandmother    Cancer Maternal Grandmother        brain   Heart failure Maternal Grandmother    Breast cancer Maternal Grandmother    Cancer Paternal Grandmother        brain    Hypertension Mother    Anxiety disorder Mother    Depression Mother    Hypertension Father    Anxiety disorder Sister    ADD / ADHD Daughter    Asthma Daughter    ADD / ADHD Daughter    Seizures Daughter    ADD / ADHD Daughter    ADD / ADHD Daughter    Family Psychiatric  History: Patient reports a history of depression in her mother and maternal aunt and a history of anxiety in her sister.  She denies any family history of alcohol or substance use disorder.  She denies any family history of suicide. Tobacco Screening:   Social History:  Social History   Substance and Sexual Activity  Alcohol Use No   Alcohol/week: 0.0 standard drinks   Comment: not while pregnant     Social History   Substance and Sexual Activity  Drug Use No    Additional Social History:                           Allergies:   Allergies  Allergen Reactions   Sulfamethoxazole-Trimethoprim Swelling and Rash   Lab Results:  Results for orders placed or performed during the hospital encounter of 09/29/20 (from the past 48 hour(s))  Rapid urine drug screen (hospital performed)     Status: None   Collection Time: 09/29/20  8:54 PM  Result Value Ref Range   Opiates NONE DETECTED NONE DETECTED   Cocaine NONE DETECTED NONE DETECTED   Benzodiazepines NONE DETECTED NONE DETECTED   Amphetamines NONE DETECTED NONE DETECTED   Tetrahydrocannabinol NONE DETECTED NONE DETECTED   Barbiturates NONE DETECTED NONE DETECTED    Comment: (NOTE) DRUG SCREEN FOR MEDICAL PURPOSES ONLY.  IF CONFIRMATION IS NEEDED FOR ANY PURPOSE, NOTIFY LAB WITHIN 5 DAYS.  LOWEST DETECTABLE LIMITS FOR URINE DRUG SCREEN Drug Class                     Cutoff (ng/mL) Amphetamine and metabolites    1000 Barbiturate and metabolites    200 Benzodiazepine                 379 Tricyclics and metabolites     300 Opiates and metabolites        300 Cocaine and metabolites        300 THC                            50 Performed at  [facility], [address] [place], [place] [address]   POC urine  preg, ED     Status: None   Collection Time: 09/29/20  9:23 PM  Result Value Ref Range   Preg Test, Ur NEGATIVE NEGATIVE    Comment:        THE SENSITIVITY OF THIS METHODOLOGY IS >24 mIU/mL   Comprehensive metabolic panel     Status: Abnormal   Collection Time: 09/29/20  9:29 PM  Result Value Ref Range   Sodium 137 135 - 145 mmol/L   Potassium 4.2 3.5 - 5.1 mmol/L   Chloride 103 98 - 111 mmol/L   CO2 25 22 - 32 mmol/L   Glucose, Bld 104 (H) 70 - 99 mg/dL    Comment: Glucose reference range applies only to samples taken after fasting for at least 8 hours.   BUN 11 6 - 20 mg/dL   Creatinine, Ser 0.59 0.44 - 1.00 mg/dL   Calcium 9.5 8.9 - 10.3 mg/dL   Total Protein 7.7 6.5 - 8.1 g/dL   Albumin 4.1 3.5 - 5.0 g/dL   AST 26 15 - 41 U/L   ALT 37 0 - 44 U/L   Alkaline Phosphatase 46 38 - 126 U/L   Total Bilirubin 0.5 0.3 - 1.2 mg/dL   GFR, Estimated >60 >60 mL/min    Comment: (NOTE) Calculated using the CKD-EPI Creatinine Equation (2021)    Anion gap 9 5 - 15    Comment: Performed at [facility], [address] [place], [place] [address]  Ethanol     Status: None   Collection Time: 09/29/20  9:29 PM  Result Value Ref Range   Alcohol, Ethyl (B) <10 <10 mg/dL    Comment: (NOTE) Lowest detectable limit for serum alcohol is 10 mg/dL.  For medical purposes only. Performed at [facility], [address], [place], [place] [address]   Salicylate level     Status: Abnormal   Collection Time: 09/29/20  9:29 PM  Result Value Ref Range   Salicylate Lvl <6.9 (L) 7.0 - 30.0 mg/dL    Comment: Performed at [facility], [address], [place], [place] [address]  Acetaminophen level     Status: Abnormal   Collection Time: 09/29/20  9:29 PM  Result Value Ref Range   Acetaminophen (Tylenol), Serum <10 (L) 10 - 30 ug/mL    Comment: (NOTE) Therapeutic concentrations vary significantly. A range of 10-30 ug/mL  may be an  effective concentration for many patients. However, some  are best treated at concentrations outside of this range. Acetaminophen concentrations >150 ug/mL at 4 hours after ingestion  and >50 ug/mL at 12 hours after ingestion are often associated with  toxic reactions.  Performed at [facility], [address], [place], [place] [address]   cbc     Status: None   Collection Time: 09/29/20  9:29 PM  Result Value Ref Range   WBC 7.6 4.0 - 10.5 K/uL   RBC 4.28 3.87 - 5.11 MIL/uL   Hemoglobin 12.8 12.0 - 15.0 g/dL   HCT 39.8 36.0 - 46.0 %   MCV 93.0 80.0 - 100.0 fL   MCH 29.9 26.0 - 34.0 pg   MCHC 32.2 30.0 - 36.0 g/dL   RDW 14.1 11.5 - 15.5 %   Platelets 293 150 - 400 K/uL   nRBC 0.0 0.0 - 0.2 %    Comment: Performed at [facility], [address] [place],  [address]  Resp Panel by RT-PCR (Flu A&B, Covid) Nasopharyngeal Swab     Status: None   Collection  Time: 09/30/20 12:31 AM   Specimen: Nasopharyngeal Swab; Nasopharyngeal(NP) swabs in vial transport medium  Result Value Ref Range   SARS Coronavirus 2 by RT PCR NEGATIVE NEGATIVE    Comment: (NOTE) SARS-CoV-2 target nucleic acids are NOT DETECTED.  The SARS-CoV-2 RNA is generally detectable in upper respiratory specimens during the acute phase of infection. The lowest concentration of SARS-CoV-2 viral copies this assay can detect is 138 copies/mL. A negative result does not preclude SARS-Cov-2 infection and should not be used as the sole basis for treatment or other patient management decisions. A negative result may occur with  improper specimen collection/handling, submission of specimen other than nasopharyngeal swab, presence of viral mutation(s) within the areas targeted by this assay, and inadequate number of viral copies(<138 copies/mL). A negative result must be combined with clinical observations, patient history, and epidemiological information. The expected result is Negative.  Fact Sheet for Patients:   EntrepreneurPulse.com.au  Fact Sheet for Healthcare Providers:  IncredibleEmployment.be  This test is no t yet approved or cleared by the [place] FDA and  has been authorized for detection and/or diagnosis of SARS-CoV-2 by FDA under an Emergency Use Authorization (EUA). This EUA will remain  in effect (meaning this test can be used) for the duration of the COVID-19 declaration under Section 564(b)(1) of the Act, 21 U.S.C.section 360bbb-3(b)(1), unless the authorization is terminated  or revoked sooner.       Influenza A by PCR NEGATIVE NEGATIVE   Influenza B by PCR NEGATIVE NEGATIVE    Comment: (NOTE) The Xpert Xpress SARS-CoV-2/FLU/RSV plus assay is intended as an aid in the diagnosis of influenza from Nasopharyngeal swab specimens and should not be used as a sole basis for treatment. Nasal washings and aspirates are unacceptable for Xpert Xpress SARS-CoV-2/FLU/RSV testing.  Fact Sheet for Patients: EntrepreneurPulse.com.au  Fact Sheet for Healthcare Providers: IncredibleEmployment.be  This test is not yet approved or cleared by the [place] FDA and has been authorized for detection and/or diagnosis of SARS-CoV-2 by FDA under an Emergency Use Authorization (EUA). This EUA will remain in effect (meaning this test can be used) for the duration of the COVID-19 declaration under Section 564(b)(1) of the Act, 21 U.S.C. section 360bbb-3(b)(1), unless the authorization is terminated or revoked.  Performed at [facility], [address] [place], [place] [address]     Blood Alcohol level:  Lab Results  Component Value Date   [facility] <10 38/45/3646    Metabolic Disorder Labs:  Lab Results  Component Value Date   HGBA1C 5.7 (H) 03/18/2019   MPG 116.89 03/18/2019   MPG 111 04/30/2017   No results found for: PROLACTIN Lab Results  Component Value Date   CHOL 201 (H) 11/01/2017   TRIG 153 (H)  11/01/2017   HDL 59 11/01/2017   CHOLHDL 3.4 11/01/2017   LDLCALC 115 (H) 11/01/2017    Current Medications: Current Facility-Administered Medications  Medication Dose Route Frequency Provider Last Rate Last Admin   acetaminophen (TYLENOL) tablet 650 mg  650 mg Oral Q6H PRN Prescilla Sours, PA-C       alum & mag hydroxide-simeth (MAALOX/MYLANTA) 200-200-20 MG/5ML suspension 30 mL  30 mL Oral Q4H PRN Margorie John W, PA-C       FLUoxetine (PROZAC) 20 MG/5ML solution 40 mg  40 mg Oral Daily Arthor Captain, MD       hydrOXYzine (ATARAX/VISTARIL) tablet 25 mg  25 mg Oral TID PRN Prescilla Sours, PA-C       loratadine (  CLARITIN) tablet 10 mg  10 mg Oral QHS PRN Arthor Captain, MD       magnesium hydroxide (MILK OF MAGNESIA) suspension 30 mL  30 mL Oral Daily PRN Margorie John W, PA-C       traZODone (DESYREL) tablet 50 mg  50 mg Oral QHS PRN Prescilla Sours, PA-C       PTA Medications: Medications Prior to Admission  Medication Sig Dispense Refill Last Dose   FLUoxetine (PROZAC) 20 MG/5ML solution Take 5 mLs (20 mg total) by mouth daily. 150 mL 6    fluticasone (FLONASE) 50 MCG/ACT nasal spray Place 1 spray into both nostrils daily for 14 days. 16 g 0    loratadine (CLARITIN) 10 MG tablet Take 10 mg by mouth at bedtime.        Musculoskeletal: Strength & Muscle Tone: within normal limits Gait & Station: normal Patient leans: N/A            Psychiatric Specialty Exam:  Presentation  General Appearance: Appropriate for Environment  Eye Contact:Good  Speech:Clear and Coherent; Normal Rate  Speech Volume:Normal  Handedness: No data recorded  Mood and Affect  Mood:Anxious  Affect:Congruent   Thought Process  Thought Processes:Coherent; Goal Directed  Duration of Psychotic Symptoms: No data recorded Past Diagnosis of Schizophrenia or Psychoactive disorder: No  Descriptions of Associations:Intact  Orientation:Full (Time, Place and Person)  Thought  Content:Logical; Rumination (Rumination regarding relationship stressors)  Hallucinations:Hallucinations: None  Ideas of Reference:None  Suicidal Thoughts:Suicidal Thoughts: No (Had suicidal ideation with thoughts of overdosing on fentanyl prior to admission)  Homicidal Thoughts:Homicidal Thoughts: No   Sensorium  Memory:Immediate Good; Recent Good; Remote Good  Judgment:Fair  Insight:Fair   Executive Functions  Concentration:Good  Attention Span:Good  Recall:Good  Fund of Knowledge:Fair  Language:Good   Psychomotor Activity  Psychomotor Activity:Psychomotor Activity: Normal   Assets  Assets:Communication Skills; Desire for Improvement; Housing; Resilience; Social Support   Sleep  Sleep:Sleep: Fair    Physical Exam: Physical Exam Vitals and nursing note reviewed. Constitutional:      General: She is not in acute distress.    Appearance: Normal appearance. She is not diaphoretic. HENT:    Head: Normocephalic and atraumatic. Pulmonary:    Effort: Pulmonary effort is normal. Neurological:    General: No focal deficit present.    Mental Status: She is alert and oriented to person, place, and time.   ROS Constitutional:  Negative for chills, diaphoresis and fever. HENT:  Negative for sore throat.   Respiratory:  Negative for cough and shortness of breath.   Cardiovascular:  Negative for chest pain and palpitations. Gastrointestinal:  Negative for constipation, diarrhea, nausea and vomiting. Musculoskeletal: Negative.   Neurological:  Negative for dizziness, tremors and seizures. Psychiatric/Behavioral:  Positive for suicidal ideas. Negative for hallucinations and substance abuse. The patient is nervous/anxious.   All other systems reviewed and are negative.  Blood pressure (!) 149/82, pulse 99, temperature 98.4 F (36.9 C), temperature source Oral, resp. rate 20, height 5' 2" (1.575 m), weight 98 kg. Body mass index is 39.5 kg/m.  Treatment Plan  Summary: Daily contact with patient to assess and evaluate symptoms and progress in treatment and Medication management  Continue IVC status.  Second QPE completed today by this Probation officer.    Observation Level/Precautions:  15 minute checks  Laboratory:  CBC Chemistry Profile HbAIC HCG UDS Lipid level, TSH. Available lab results reviewed.  CMP with glucose 104 and otherwise WNL.  CBC was WNL.  Differential was  not performed.  Acetaminophen and salicylate levels were negative.  Urine pregnancy test was negative.  BAL was negative.  Urine drug screen was negative.  Lipid panel, hemoglobin A1c and TSH were not performed but have been ordered.    EKG has been ordered but not yet performed.    Psychotherapy:    Medications:  Will increase Prozac to 40 mg daily to target symptoms of anxiety and any possible depression.  We will use hydroxyzine 25 mg 3 times daily PRN anxiety and trazodone 50 mg at bedtime PRN insomnia.  See MAR for additional information.    Consultations:    Discharge Concerns:    Estimated LOS: 3 to 5 days  Other:     Physician Treatment Plan for Primary Diagnosis: Adjustment disorder with mixed anxiety and depressed mood Long Term Goal(s): Improvement in symptoms so as ready for discharge  Short Term Goals: Ability to identify changes in lifestyle to reduce recurrence of condition will improve, Ability to verbalize feelings will improve, Ability to disclose and discuss suicidal ideas, Ability to demonstrate self-control will improve, Ability to identify and develop effective coping behaviors will improve, Compliance with prescribed medications will improve, and Ability to identify triggers associated with substance abuse/mental health issues will improve  Physician Treatment Plan for Secondary Diagnosis: Principal Problem:   Adjustment disorder with mixed anxiety and depressed mood Active Problems:   MDD (major depressive disorder), recurrent severe, without psychosis ([place])    Panic attacks   Social anxiety disorder  Long Term Goal(s): Improvement in symptoms so as ready for discharge  Short Term Goals: Ability to identify changes in lifestyle to reduce recurrence of condition will improve, Ability to verbalize feelings will improve, Ability to disclose and discuss suicidal ideas, Ability to demonstrate self-control will improve, Ability to identify and develop effective coping behaviors will improve, Compliance with prescribed medications will improve, and Ability to identify triggers associated with substance abuse/mental health issues will improve  I certify that inpatient services furnished can reasonably be expected to improve the patient's condition.    Arthor Captain, MD 6/24/20221:42 PM

## 2020-09-30 NOTE — Progress Notes (Signed)
40 year old female patient admitted on involuntary basis. On admission she spoke about how she got into an argument with her ex-boyfriend and verbalized to her mother that she was feeling suicidal and thinks that her mother took out papers on her and had her committed. On admission she does endorse that she was feeling depressed and made some suicidal comments but denies any of this on admission and is able to contract for safety while in the hospital. She reports that she lives with her 5 children and reports that she will return there once she is discharged. She reports that she is on a daily Prozac that is prescribed by her PCP. She denies any pain on admission. She reports that she would like to get set up with a therapist and reports that she had one in the past. Patient was oriented to the unit and safety maintained.

## 2020-09-30 NOTE — [facility] Counselor (Signed)
Adult Comprehensive Assessment  Patient ID: [name], female   DOB: 31-Jan-1981, 40 y.o.   MRN: [id number]  Information Source: Information source: Patient  Current Stressors:  Patient states their primary concerns and needs for treatment are:: "I need a therapist" Patient states their goals for this hospitilization and ongoing recovery are:: "To get into therapy" Educational / Learning stressors: Pt reports a 10th grade education Employment / Job issues: Pt reports being unemployed and receiving childsupport and SSI benefits from her children's father Family Relationships: Pt reports no stressors Financial / Lack of resources (include bankruptcy): Pt reports having limited income Housing / Lack of housing: Pt reports living with her 5 children Physical health (include injuries & life threatening diseases): Pt reports no stressors Social relationships: Pt reports no stressors Substance abuse: Pt denies all substance use Bereavement / Loss: Pt reports no streesors  Living/Environment/Situation:  Living Arrangements: Children Living conditions (as described by patient or guardian): "I like it but I want my own place one day" Who else lives in the home?: 5 minor children How long has patient lived in current situation?: 4 years What is atmosphere in current home: Comfortable, Supportive  Family History:  Marital status: Single (Pt reports a recent break up from youngest child's father) Are you sexually active?: Yes What is your sexual orientation?: Heterosexual Has your sexual activity been affected by drugs, alcohol, medication, or emotional stress?: No Does patient have children?: Yes How many children?: 6 How is patient's relationship with their children?: Pt reports having 6 children (ages 45, 1, 56, 14, 8, and 4) "We get along ok but some of them have behavioral concerns"  Childhood History:  By whom was/is the patient raised?: Both parents Description of patient's  relationship with caregiver when they were a child: "Good" Patient's description of current relationship with people who raised him/her: "It is still good" How were you disciplined when you got in trouble as a child/adolescent?: Spankings Does patient have siblings?: Yes Number of Siblings: 1 Description of patient's current relationship with siblings: "I have a sister and we get along good" Did patient suffer any verbal/emotional/physical/sexual abuse as a child?: No Did patient suffer from severe childhood neglect?: No Has patient ever been sexually abused/assaulted/raped as an adolescent or adult?: No Was the patient ever a victim of a crime or a disaster?: No Witnessed domestic violence?: No Has patient been affected by domestic violence as an adult?: No  Education:  Highest grade of school patient has completed: 10th Grade Currently a student?: No Learning disability?: No  Employment/Work Situation:   Employment Situation: Unemployed (Pt reports receiving child support and SSI from her children's father) Patient's Job has Been Impacted by Current Illness: No What is the Longest Time Patient has Held a Job?: 2 years Where was the Patient Employed at that Time?: Nursing Home Has Patient ever Been in the [employer]?: No  Financial Resources:   Financial resources: Income from spouse, Medicaid Does patient have a representative payee or guardian?: No  Alcohol/Substance Abuse:   What has been your use of drugs/alcohol within the last 12 months?: Pt denies all substance use If attempted suicide, did drugs/alcohol play a role in this?: No Alcohol/Substance Abuse Treatment Hx: Denies past history Has alcohol/substance abuse ever caused legal problems?: No  Social Support System:   Patient's Community Support System: Good Describe Community Support System: Family and friends Type of faith/religion: None How does patient's faith help to cope with current illness?:  None  Leisure/Recreation:  Do You Have Hobbies?: Yes Leisure and Hobbies: Listening to music, dancing, and making wreaths  Strengths/Needs:   What is the patient's perception of their strengths?: Being a good mother Patient states they can use these personal strengths during their treatment to contribute to their recovery: "It keeps me busy" Patient states these barriers may affect/interfere with their treatment: None Patient states these barriers may affect their return to the community: None Other important information patient would like considered in planning for their treatment: None  Discharge Plan:   Currently receiving community mental health services: No Patient states concerns and preferences for aftercare planning are: Pt is interested in therapy in [place] Patient states they will know when they are safe and ready for discharge when: "When I get outpatient therapy lined up" Does patient have access to transportation?: Yes (Pt reports having her own car at home) Does patient have financial barriers related to discharge medications?: No Will patient be returning to same living situation after discharge?: Yes  Summary/Recommendations:   Summary and Recommendations (to be completed by the evaluator): [name] is a 40 year old, female who was admitted to the hospital due to suicidal thoughts and worsening depression.  The Pt reports that she has was admitted to the hospital previousl on one ocassion.  The Pt reprots that the suicidal thoughts began approximately 1 weeks ago when her ex-boyfriend said some negative comments about her.  She states that this made her feel depressed.  The Pt reports that she has a 10th grade education and has not held employment since 2005.  The Pt reprots that she is unable to work because of her 6 children and their doctor's appointments and other activities.  The Pt reports that her mother, sister, and oldest daugther help her with the child  care.  The Pt reports a limited income from child support and SSi from her children's father.  The Pt denies all substance use and reports no previous substance use treatment.  While in the hospital the Pt can benefit from crisis stabilization, medication evaluation, group therapy, psycho-education, case management, and discharge planning.  Upon discharge the Pt would like to return to her home with her children and will follow up with [facility] in [place] for therapy and medication management.  Aram Beecham. 09/30/2020

## 2020-09-30 NOTE — Progress Notes (Signed)
Adult Psychoeducational Group Note  Date:  09/30/2020 Time:  8:42 PM  Group Topic/Focus:  Wrap-Up Group:   The focus of this group is to help patients review their daily goal of treatment and discuss progress on daily workbooks.  Participation Level:  Active  Participation Quality:  Appropriate and Attentive  Affect:  Appropriate  Cognitive:  Alert and Appropriate  Insight: Appropriate  Engagement in Group:  Developing/Improving  Modes of Intervention:  Discussion  Additional Comments:  pt showed insight into their goal and how they would achieve this.   Suszanne Conners Tinlee Navarrette 09/30/2020, 8:42 PM

## 2020-09-30 NOTE — Progress Notes (Signed)
Recreation Therapy Notes  Date:  6.24.22 Time: 0930 Location: [address] Dayroom   Group Topic: Stress Management   Goal Area(s) Addresses: Patient will identify positive stress management techniques. Patient will identify benefits of using stress management post d/c.   Intervention: Stress Management    Activity: Meditation.  LRT play a meditation for patients to calm and relax the body.     Education:  Stress Management, Discharge Planning.   Education Outcome: Acknowledges Education   Clinical Observations/Feedback:  LRT unable to do group due to fire drill and goals group running over time.     Caroll Rancher, LRT/CTRS         Caroll Rancher A 09/30/2020 10:53 AM

## 2020-09-30 NOTE — [facility] Group Notes (Signed)
Type of Therapy and Topic:  Group Therapy:  Positive Affirmations   Participation Level:  Active  Description of Group: This group addressed positive affirmation toward self and others. Patients went around the room and identified two positive things about themselves and two positive things about a peer in the room. Patients reflected on how it felt to share something positive with others, to identify positive things about themselves, and to hear positive things from others. Patients were encouraged to have a daily reflection of positive characteristics or circumstances. Therapeutic Goals Patient will verbalize two of their positive qualities Patient will demonstrate empathy for others by stating two positive qualities about a peer in the group Patient will verbalize their feelings when voicing positive self affirmations and when voicing positive affirmations of others Patients will discuss the potential positive impact on their wellness/recovery of focusing on positive traits of self and others. Summary of Patient Progress: [name] attended the group and participated in the discussion with their peers. [name] states that one positive thing about her is that she can make wreaths and her positive affirmation is that she deserves respect.    Therapeutic Modalities Cognitive Behavioral Therapy Motivational Interviewing

## 2020-09-30 NOTE — ED Notes (Signed)
IVC paperwork being faxed to [phone]

## 2020-10-01 DIAGNOSIS — F4323 Adjustment disorder with mixed anxiety and depressed mood: Secondary | ICD-10-CM | POA: Diagnosis not present

## 2020-10-01 NOTE — [facility] Group Notes (Signed)
LCSW Group Therapy Note  10/01/2020   10:00-11:00am   Type of Therapy and Topic:  Group Therapy: Anger Cues and Responses  Participation Level:  Active   Description of Group:   In this group, patients learned how to recognize the physical, cognitive, emotional, and behavioral responses they have to anger-provoking situations.  They identified a recent time they became angry and how they reacted.  They analyzed how their reaction was possibly beneficial and how it was possibly unhelpful.  The group discussed a variety of healthier coping skills that could help with such a situation in the future.  Focus was placed on how helpful it is to recognize the underlying emotions to our anger, because working on those can lead to a more permanent solution as well as our ability to focus on the important rather than the urgent.  Therapeutic Goals: Patients will remember their last incident of anger and how they felt emotionally and physically, what their thoughts were at the time, and how they behaved. Patients will identify how their behavior at that time worked for them, as well as how it worked against them. Patients will explore possible new behaviors to use in future anger situations. Patients will learn that anger itself is normal and cannot be eliminated, and that healthier reactions can assist with resolving conflict rather than worsening situations.  Summary of Patient Progress:  The patient shared that her most recent time of anger was being bullied at school and said her reaction was to go the school and have this issue resolve..  Therapeutic Modalities:   Cognitive Behavioral Therapy  Evorn Gong

## 2020-10-01 NOTE — Progress Notes (Signed)
   09/30/20 1955  Psych Admission Type (Psych Patients Only)  Admission Status Involuntary  Psychosocial Assessment  Patient Complaints None  Eye Contact Fair  Facial Expression Flat  Affect Appropriate to circumstance  Speech Unremarkable  Interaction Other (Comment) (appropriate)  Motor Activity Slow  Appearance/Hygiene Improved  Behavior Characteristics Appropriate to situation;Cooperative;Calm  Mood Pleasant  Thought Process  Coherency WDL  Content WDL  Delusions None reported or observed  Perception WDL  Hallucination None reported or observed  Judgment WDL  Confusion None  Danger to Self  Current suicidal ideation? Denies (able to contract for safety)  Danger to Others  Danger to Others None reported or observed

## 2020-10-01 NOTE — [facility] Group Notes (Signed)
Adult Psychoeducational Group Note  Date:  10/01/2020 Time:  11:20 AM  Group Topic/Focus:  Coping With Mental Health Crisis:   The purpose of this group is to help patients identify strategies for coping with mental health crisis.  Group discusses possible causes of crisis and ways to manage them effectively.  Participation Level:  Active  Participation Quality:  Appropriate  Affect:  Appropriate  Cognitive:  Appropriate  Insight: Appropriate  Engagement in Group:  Engaged  Modes of Intervention:  Discussion  Additional Comments:  Patient attended coping skills group and participated.   Taelynn Mcelhannon W Montrell Cessna 10/01/2020, 11:20 AM

## 2020-10-01 NOTE — Progress Notes (Signed)
Patient states that she worked on her suicide plan sheet. Her goal for tomorrow is to work on her goals.

## 2020-10-01 NOTE — Progress Notes (Signed)
   10/01/20 2100  Psych Admission Type (Psych Patients Only)  Admission Status Involuntary  Psychosocial Assessment  Patient Complaints None  Eye Contact Fair  Facial Expression Flat  Affect Appropriate to circumstance  Speech Unremarkable  Interaction Other (Comment) (appropriate)  Motor Activity Slow  Appearance/Hygiene Improved  Behavior Characteristics Cooperative;Appropriate to situation  Mood Pleasant  Thought Process  Coherency WDL  Content WDL  Delusions None reported or observed  Perception WDL  Hallucination None reported or observed  Judgment WDL  Confusion None  Danger to Self  Current suicidal ideation? Denies (able to contract for safety)  Danger to Others  Danger to Others None reported or observed

## 2020-10-01 NOTE — Progress Notes (Signed)
   10/01/20 1200  Psych Admission Type (Psych Patients Only)  Admission Status Involuntary  Psychosocial Assessment  Patient Complaints None  Eye Contact Fair  Facial Expression Flat  Affect Appropriate to circumstance  Speech Unremarkable  Interaction Other (Comment) (appropriate)  Motor Activity Slow  Appearance/Hygiene Improved  Behavior Characteristics Cooperative  Mood Pleasant  Thought Process  Coherency WDL  Content WDL  Delusions None reported or observed  Perception WDL  Hallucination None reported or observed  Judgment WDL  Confusion None  Danger to Self  Current suicidal ideation? Denies (able to contract for safety)  Danger to Others  Danger to Others None reported or observed

## 2020-10-01 NOTE — Progress Notes (Signed)
[facility] MD Progress Note  10/01/2020 5:46 PM [name]  MRN:  [id number] Subjective:  " I am doing much better.  I was taking medications in the past and stopped.  I will not stop my medications again"  Reason for hospitalization: [name] is a 40 year old female with self-reported history of anxiety and panic attacks who presented to [facility] BIB [place] police under IVC taken out by her mother for making suicidal threats to overdose on fentanyl after having an argument with her ex-boyfriend who is the father of one of her children. Objective:  Record reviewed, Nursing report obtained and care reviewed with members of our interdisciplinary team.  Provider saw patient and interacted with her.  Patient stated that she was taking medication for Depressed mood and stopped as soon as she started feeling much better.  She reported that issue with her relationship led to her depressed mood and she denied that she threatened to kill herself.  She is however happy that she is receiving care in the unit.  Patient is under IVC taken out by her mother for threatening to kill herself by OD with Fentanyl.  She is participating in group activities and so far compliant with her medications.  Patient reported better night sleep and appetite.  She is visible in the unit and interacts well with peers.  Today she denied feeling suicidal or Homicidal and no AVH or paranoia.  Patient is looking forward to leaving early next week.  Principal Problem: Adjustment disorder with mixed anxiety and depressed mood Diagnosis: Principal Problem:   Adjustment disorder with mixed anxiety and depressed mood Active Problems:   MDD (major depressive disorder), recurrent severe, without psychosis (HCC)   Panic attacks   Social anxiety disorder  Total Time spent with patient:  25 minutes  Past Psychiatric History: See H&P note  Past Medical History:  Past Medical History:  Diagnosis Date   Anemia    Anxiety "fast  hearbeat"   took Benadryl at end of pregnancy   Back pain affecting pregnancy in first trimester 05/27/2015   Complication of anesthesia    Epidural 1 sided   Depression    During 1 pregnancy   Diabetes mellitus without complication (HCC)    Endometriosis    Gallstones    Gestational diabetes    just started checking BS 03/05/12   Headache(784.0)    Hematuria 05/27/2015   History of kidney stones    Irregular periods    Nausea 01/27/2015   Pregnancy induced hypertension    Pregnant 01/27/2015   Recurrent upper respiratory infection (URI)    Mostly when pregnant   Spotting affecting pregnancy in first trimester 01/27/2015    Past Surgical History:  Procedure Laterality Date   CHOLECYSTECTOMY N/A 03/20/2019   Procedure: LAPAROSCOPIC CHOLECYSTECTOMY;  Surgeon: Franky Macho, MD;  Location: [facility];  Service: General;  Laterality: N/A;   MULTIPLE TOOTH EXTRACTIONS     WISDOM TOOTH EXTRACTION     Family History:  Family History  Problem Relation Age of Onset   Heart disease Maternal Grandmother    Cancer Maternal Grandmother        brain   Heart failure Maternal Grandmother    Breast cancer Maternal Grandmother    Cancer Paternal Grandmother        brain   Hypertension Mother    Anxiety disorder Mother    Depression Mother    Hypertension Father    Anxiety disorder Sister    ADD / ADHD  Daughter    Asthma Daughter    ADD / ADHD Daughter    Seizures Daughter    ADD / ADHD Daughter    ADD / ADHD Daughter    Family Psychiatric  History: see H&P note Social History:  Social History   Substance and Sexual Activity  Alcohol Use No   Alcohol/week: 0.0 standard drinks   Comment: not while pregnant     Social History   Substance and Sexual Activity  Drug Use No    Social History   Socioeconomic History   Marital status: Divorced    Spouse name: Not on file   Number of children: Not on file   Years of education: Not on file   Highest education level: Not on file   Occupational History   Not on file  Tobacco Use   Smoking status: Former    Packs/day: 0.10    Years: 0.50    Pack years: 0.05    Types: Cigarettes    Start date: 02/19/1998    Quit date: 09/17/1998    Years since quitting: 22.0   Smokeless tobacco: Never  Vaping Use   Vaping Use: Never used  Substance and Sexual Activity   Alcohol use: No    Alcohol/week: 0.0 standard drinks    Comment: not while pregnant   Drug use: No   Sexual activity: Yes    Partners: Male    Birth control/protection: None  Other Topics Concern   Not on file  Social History Narrative   Lives in [place], [place]   Does not work outside home.   Married for over 3 years.    Wear seatbelt.   Eats all food groups.   Enjoys dance.    Social Determinants of Health   Financial Resource Strain: Low Risk    Difficulty of Paying Living Expenses: Not very hard  Food Insecurity: No Food Insecurity   Worried About Programme researcher, broadcasting/film/video in the Last Year: Never true   Ran Out of Food in the Last Year: Never true  Transportation Needs: No Transportation Needs   Lack of Transportation (Medical): No   Lack of Transportation (Non-Medical): No  Physical Activity: Sufficiently Active   Days of Exercise per Week: 5 days   Minutes of Exercise per Session: 30 min  Stress: Unknown   Feeling of Stress : Patient refused  Social Connections: Unknown   Frequency of Communication with Friends and Family: More than three times a week   Frequency of Social Gatherings with Friends and Family: Three times a week   Attends Religious Services: Never   Active Member of Clubs or Organizations: No   Attends Engineer, structural: Patient refused   Marital Status: Patient refused   Additional Social History:                         Sleep: Good  Appetite:  Good  Current Medications: Current Facility-Administered Medications  Medication Dose Route Frequency Provider Last Rate Last Admin   acetaminophen  (TYLENOL) tablet 650 mg  650 mg Oral Q6H PRN Jaclyn Shaggy, PA-C       alum & mag hydroxide-simeth (MAALOX/MYLANTA) 200-200-20 MG/5ML suspension 30 mL  30 mL Oral Q4H PRN Melbourne Abts W, PA-C       azelastine (ASTELIN) 0.1 % nasal spray 1 spray  1 spray Each Nare BID PRN Claudie Revering, MD   1 spray at 09/30/20 2126   FLUoxetine (PROZAC)  20 MG/5ML solution 40 mg  40 mg Oral Daily Claudie ReveringJames, Martha L, MD   40 mg at 10/01/20 0831   hydrOXYzine (ATARAX/VISTARIL) tablet 25 mg  25 mg Oral TID PRN Jaclyn Shaggyaylor, Cody W, PA-C       loratadine (CLARITIN) tablet 10 mg  10 mg Oral QHS PRN Claudie ReveringJames, Martha L, MD       magnesium hydroxide (MILK OF MAGNESIA) suspension 30 mL  30 mL Oral Daily PRN Melbourne Abtsaylor, Cody W, PA-C       traZODone (DESYREL) tablet 50 mg  50 mg Oral QHS PRN Jaclyn Shaggyaylor, Cody W, PA-C        Lab Results:  Results for orders placed or performed during the hospital encounter of 09/30/20 (from the past 48 hour(s))  T4, free     Status: Abnormal   Collection Time: 09/30/20  6:11 PM  Result Value Ref Range   Free T4 0.53 (L) 0.61 - 1.12 ng/dL    Comment: (NOTE) Biotin ingestion may interfere with free T4 tests. If the results are inconsistent with the TSH level, previous test results, or the clinical presentation, then consider biotin interference. If needed, order repeat testing after stopping biotin. Performed at [facility], [address] [place], [place] [address]   Hemoglobin A1c     Status: None   Collection Time: 09/30/20  6:11 PM  Result Value Ref Range   Hgb A1c MFr Bld 5.4 4.8 - 5.6 %    Comment: (NOTE) Pre diabetes:          5.7%-6.4%  Diabetes:              >6.4%  Glycemic control for   <7.0% adults with diabetes    Mean Plasma Glucose 108.28 mg/dL    Comment: Performed at [facility], [address] [place], [place] [address]  Lipid panel     Status: Abnormal   Collection Time: 09/30/20  6:11 PM  Result Value Ref Range   Cholesterol 188 0 - 200 mg/dL    Triglycerides 098181 (H) <150 mg/dL   HDL 53 >11>40 mg/dL   Total CHOL/HDL Ratio 3.5 RATIO   VLDL 36 0 - 40 mg/dL   LDL Cholesterol 99 0 - 99 mg/dL    Comment:        Total Cholesterol/HDL:CHD Risk Coronary Heart Disease Risk Table                     Men   Women  1/2 Average Risk   3.4   3.3  Average Risk       5.0   4.4  2 X Average Risk   9.6   7.1  3 X Average Risk  23.4   11.0        Use the calculated Patient Ratio above and the CHD Risk Table to determine the patient's CHD Risk.        ATP III CLASSIFICATION (LDL):  <100     mg/dL   Optimal  [id number]  mg/dL   Near or Above                    Optimal  130-159  mg/dL   Borderline  [id number]  mg/dL   High  >086>190     mg/dL   Very High Performed at [facility], [address] [place], [place] [address]   TSH     Status: None   Collection Time: 09/30/20  6:11 PM  Result Value Ref Range   TSH 3.327 0.350 - 4.500 uIU/mL    Comment: Performed by a 3rd Generation assay with a functional sensitivity of <=0.01 uIU/mL. Performed at [facility], [address] [place], [place] [address]     Blood Alcohol level:  Lab Results  Component Value Date   ETH <10 09/29/2020    Metabolic Disorder Labs: Lab Results  Component Value Date   HGBA1C 5.4 09/30/2020   MPG 108.28 09/30/2020   MPG 116.89 03/18/2019   No results found for: PROLACTIN Lab Results  Component Value Date   CHOL 188 09/30/2020   TRIG 181 (H) 09/30/2020   HDL 53 09/30/2020   CHOLHDL 3.5 09/30/2020   VLDL 36 09/30/2020   LDLCALC 99 09/30/2020   LDLCALC 115 (H) 11/01/2017    Physical Findings: AIMS:  , ,  ,  ,    CIWA:    COWS:     Musculoskeletal: Strength & Muscle Tone: within normal limits Gait & Station: normal Patient leans: Front  Psychiatric Specialty Exam: Psychiatric Specialty Exam: Physical Exam Vitals and nursing note reviewed.  Constitutional:      Appearance: Normal appearance. She is obese.   HENT:     Head: Normocephalic and atraumatic.  Cardiovascular:     Rate and Rhythm: Normal rate.     Pulses: Normal pulses.  Musculoskeletal:     Cervical back: Normal range of motion.  Neurological:     Mental Status: She is alert.    Review of Systems  Constitutional: Negative.   HENT: Negative.    Eyes: Negative.   Respiratory: Negative.    Cardiovascular: Negative.   Gastrointestinal: Negative.   Genitourinary: Negative.   Musculoskeletal: Negative.   Skin: Negative.   Neurological: Negative.   Psychiatric/Behavioral:  Positive for depression. The patient is nervous/anxious.    Blood pressure (!) 125/94, pulse (!) 101, temperature 98 F (36.7 C), temperature source Oral, resp. rate 20, height  (1.575 m), weight 98 kg, SpO2 99 %.Body mass index is 39.5 kg/m.  General Appearance: Casual, Fairly Groomed, and Neat  Eye Contact:  Good  Speech:  Clear and Coherent and Normal Rate  Volume:  Normal  Mood:  Anxious, Depressed, and reported more anxiety than depression  Affect:  Non-Congruent  Thought Process:  Coherent, Goal Directed, and Linear  Orientation:  Full (Time, Place, and Person)  Thought Content:  Logical  Suicidal Thoughts:  No  Homicidal Thoughts:  No  Memory:  Immediate;   Good Recent;   Good Remote;   Good  Judgement:  Fair  Insight:  Good  Psychomotor Activity:  Normal  Concentration:  Concentration: Good and Attention Span: Good  Recall:  Good  Fund of Knowledge:  Good  Language:  Good  Akathisia:  NA  Handed:  Right  AIMS (if indicated):     Assets:  Communication Skills Desire for Improvement Housing Physical Health Resilience  ADL's:  Intact  Cognition:  WNL  Sleep:  Number of Hours: 6    Presentation  General Appearance: Appropriate for Environment  Eye Contact:Good  Speech:Clear and Coherent; Normal Rate  Speech Volume:Normal  Handedness: No data recorded  Mood and Affect  Mood:Anxious  Affect:Congruent   Thought  Process  Thought Processes:Coherent; Goal Directed  Descriptions of Associations:Intact  Orientation:Full (Time, Place and Person)  Thought Content:Logical; Rumination (Rumination regarding relationship stressors)  History of Schizophrenia/Schizoaffective disorder:No  Duration of Psychotic Symptoms:No data recorded Hallucinations:Hallucinations: None  Ideas  of Reference:None  Suicidal Thoughts:Suicidal Thoughts: No (Had suicidal ideation with thoughts of overdosing on fentanyl prior to admission)  Homicidal Thoughts:Homicidal Thoughts: No   Sensorium  Memory:Immediate Good; Recent Good; Remote Good  Judgment:Fair  Insight:Fair   Executive Functions  Concentration:Good  Attention Span:Good  Recall:Good  Fund of Knowledge:Fair  Language:Good   Psychomotor Activity  Psychomotor Activity:Psychomotor Activity: Normal   Assets  Assets:Communication Skills; Desire for Improvement; Housing; Resilience; Social Support   Sleep  Sleep:Sleep: Fair    Physical Exam: Physical Exam Vitals and nursing note reviewed.  Constitutional:      Appearance: Normal appearance. She is obese.  HENT:     Head: Normocephalic and atraumatic.  Cardiovascular:     Rate and Rhythm: Normal rate.     Pulses: Normal pulses.  Musculoskeletal:     Cervical back: Normal range of motion.  Neurological:     Mental Status: She is alert.   Review of Systems  Constitutional: Negative.   HENT: Negative.    Eyes: Negative.   Respiratory: Negative.    Cardiovascular: Negative.   Gastrointestinal: Negative.   Genitourinary: Negative.   Musculoskeletal: Negative.   Skin: Negative.   Neurological: Negative.   Endo/Heme/Allergies: Negative.   Psychiatric/Behavioral:  Positive for depression. The patient is nervous/anxious.   Blood pressure (!) 125/94, pulse (!) 101, temperature 98 F (36.7 C), temperature source Oral, resp. rate 20, height 5\' 2"  (1.575 m), weight 98 kg, SpO2 99 %.  Body mass index is 39.5 kg/m.   Treatment Plan Summary: Daily contact with patient to assess and evaluate symptoms and progress in treatment and Medication management  Continue Fluoxetine Liquid 20 mg po daily for depression and anxiety Continue Claritin tablet 10 mg po daily for allergy  Continue Trazodone 50 mg po as needed for sleep Continue Hydroxyzine 25 mg po tid as needed for anxiety. Offer other PRN medications as needed per protocol. Encourage oral fluid intake-HR slightly tachycardia.  , NP 10/01/2020, 5:46 PM

## 2020-10-02 DIAGNOSIS — F4323 Adjustment disorder with mixed anxiety and depressed mood: Secondary | ICD-10-CM | POA: Diagnosis not present

## 2020-10-02 NOTE — [facility] Group Notes (Signed)
[facility] Group Notes:  (Nursing/MHT/Case Management/Adjunct)  Date:  10/02/2020  Time:  5:23 PM  Type of Therapy:  Relaxation therapy  Participation Level:  Did Not Attend  Participation Quality:    Affect:    Cognitive:    Insight:    Engagement in Group:    Modes of Intervention:  Activity  Summary of Progress/Problems:  Did not attend despite staff invitation.    Kandie Keiper V Aleyssa Pike 10/02/2020, 5:23 PM 

## 2020-10-02 NOTE — [facility] Group Notes (Signed)
[facility] LCSW Group Therapy Note  Date/Time:  10/02/2020 9:00-10:00 or 10:00-11:00AM  Type of Therapy and Topic:  Group Therapy:  Healthy and Unhealthy Supports  Participation Level:  Active   Description of Group:  Patients in this group were introduced to the idea of adding a variety of healthy supports to address the various needs in their lives.Patients discussed what additional healthy supports could be helpful in their recovery and wellness after discharge in order to prevent future hospitalizations.   An emphasis was placed on using counselor, doctor, therapy groups, 12-step groups, and problem-specific support groups to expand supports.  They also worked as a group on developing a specific plan for several patients to deal with unhealthy supports through boundary-setting, psychoeducation with loved ones, and even termination of relationships.   Therapeutic Goals:   1)  discuss importance of adding supports to stay well once out of the hospital  2)  compare healthy versus unhealthy supports and identify some examples of each  3)  generate ideas and descriptions of healthy supports that can be added  4)  offer mutual support about how to address unhealthy supports  5)  encourage active participation in and adherence to discharge plan    Summary of Patient Progress:  The patient stated that current healthy supports in her life are family while current unhealthy supports include herself.  The patient expressed a willingness to add therapy as support(s) to help in her  recovery journey.   Therapeutic Modalities:   Motivational Interviewing Brief Solution-Focused Therapy  Evorn Gong

## 2020-10-02 NOTE — Progress Notes (Signed)
[facility] MD Progress Note  10/02/2020 1:42 PM [name]  MRN:  [id number] Subjective:  " I am doing much better and ready to leave"  Reason for hospitalization: Rona Tomson is a 40 year old female with self-reported history of anxiety and panic attacks who presented to [facility] BIB [place] [employer] under IVC taken out by her mother for making suicidal threats to overdose on fentanyl after having an argument with her ex-boyfriend who is the father of one of her children.  Objective:  Record reviewed, Nursing report obtained and care reviewed with members of our interdisciplinary team.  Patient was seen for daily evaluation and reported much improvement.  She want to be discharged today but was informed that discharge planner will not be able to complete discharge on a Sunday without looking for outpatient therapist and Psychiatrist for her.  Patient reported improved sleep and appetite.  She is participating in group activities and able to socialize well with peers.  Patient is scheduled for possible discharge tomorrow.  She denied feeling SI/HI/AVH and no mention of paranoia. Principal Problem: Adjustment disorder with mixed anxiety and depressed mood Diagnosis: Principal Problem:   Adjustment disorder with mixed anxiety and depressed mood Active Problems:   MDD (major depressive disorder), recurrent severe, without psychosis (HCC)   Panic attacks   Social anxiety disorder  Total Time spent with patient: 15 minutes  Past Psychiatric History: See H&P note  Past Medical History:  Past Medical History:  Diagnosis Date   Anemia    Anxiety "fast hearbeat"   took Benadryl at end of pregnancy   Back pain affecting pregnancy in first trimester 05/27/2015   Complication of anesthesia    Epidural 1 sided   Depression    During 1 pregnancy   Diabetes mellitus without complication (HCC)    Endometriosis    Gallstones    Gestational diabetes    just started checking BS 03/05/12    Headache(784.0)    Hematuria 05/27/2015   History of kidney stones    Irregular periods    Nausea 01/27/2015   Pregnancy induced hypertension    Pregnant 01/27/2015   Recurrent upper respiratory infection (URI)    Mostly when pregnant   Spotting affecting pregnancy in first trimester 01/27/2015    Past Surgical History:  Procedure Laterality Date   CHOLECYSTECTOMY N/A 03/20/2019   Procedure: LAPAROSCOPIC CHOLECYSTECTOMY;  Surgeon: Franky Macho, MD;  Location: [facility];  Service: General;  Laterality: N/A;   MULTIPLE TOOTH EXTRACTIONS     WISDOM TOOTH EXTRACTION     Family History:  Family History  Problem Relation Age of Onset   Heart disease Maternal Grandmother    Cancer Maternal Grandmother        brain   Heart failure Maternal Grandmother    Breast cancer Maternal Grandmother    Cancer Paternal Grandmother        brain   Hypertension Mother    Anxiety disorder Mother    Depression Mother    Hypertension Father    Anxiety disorder Sister    ADD / ADHD Daughter    Asthma Daughter    ADD / ADHD Daughter    Seizures Daughter    ADD / ADHD Daughter    ADD / ADHD Daughter    Family Psychiatric  History: see H&P note Social History:  Social History   Substance and Sexual Activity  Alcohol Use No   Alcohol/week: 0.0 standard drinks   Comment: not while pregnant  Social History   Substance and Sexual Activity  Drug Use No    Social History   Socioeconomic History   Marital status: Divorced    Spouse name: Not on file   Number of children: Not on file   Years of education: Not on file   Highest education level: Not on file  Occupational History   Not on file  Tobacco Use   Smoking status: Former    Packs/day: 0.10    Years: 0.50    Pack years: 0.05    Types: Cigarettes    Start date: 02/19/1998    Quit date: 09/17/1998    Years since quitting: 22.0   Smokeless tobacco: Never  Vaping Use   Vaping Use: Never used  Substance and Sexual Activity    Alcohol use: No    Alcohol/week: 0.0 standard drinks    Comment: not while pregnant   Drug use: No   Sexual activity: Yes    Partners: Male    Birth control/protection: None  Other Topics Concern   Not on file  Social History Narrative   Lives in [place], [place]   Does not work outside home.   Married for over 3 years.    Wear seatbelt.   Eats all food groups.   Enjoys dance.    Social Determinants of Health   Financial Resource Strain: Low Risk    Difficulty of Paying Living Expenses: Not very hard  Food Insecurity: No Food Insecurity   Worried About Programme researcher, broadcasting/film/video in the Last Year: Never true   Ran Out of Food in the Last Year: Never true  Transportation Needs: No Transportation Needs   Lack of Transportation (Medical): No   Lack of Transportation (Non-Medical): No  Physical Activity: Sufficiently Active   Days of Exercise per Week: 5 days   Minutes of Exercise per Session: 30 min  Stress: Unknown   Feeling of Stress : Patient refused  Social Connections: Unknown   Frequency of Communication with Friends and Family: More than three times a week   Frequency of Social Gatherings with Friends and Family: Three times a week   Attends Religious Services: Never   Active Member of Clubs or Organizations: No   Attends Engineer, structural: Patient refused   Marital Status: Patient refused   Additional Social History:                         Sleep: Good  Appetite:  Good  Current Medications: Current Facility-Administered Medications  Medication Dose Route Frequency Provider Last Rate Last Admin   acetaminophen (TYLENOL) tablet 650 mg  650 mg Oral Q6H PRN Jaclyn Shaggy, PA-C       alum & mag hydroxide-simeth (MAALOX/MYLANTA) 200-200-20 MG/5ML suspension 30 mL  30 mL Oral Q4H PRN Melbourne Abts W, PA-C       azelastine (ASTELIN) 0.1 % nasal spray 1 spray  1 spray Each Nare BID PRN Claudie Revering, MD   1 spray at 10/01/20 2059   FLUoxetine (PROZAC) 20  MG/5ML solution 40 mg  40 mg Oral Daily Claudie Revering, MD   40 mg at 10/02/20 0748   hydrOXYzine (ATARAX/VISTARIL) tablet 25 mg  25 mg Oral TID PRN Jaclyn Shaggy, PA-C       loratadine (CLARITIN) tablet 10 mg  10 mg Oral QHS PRN Claudie Revering, MD       magnesium hydroxide (MILK OF MAGNESIA) suspension 30 mL  30 mL Oral Daily PRN Jaclyn Shaggyaylor, Cody W, PA-C       traZODone (DESYREL) tablet 50 mg  50 mg Oral QHS PRN Jaclyn Shaggyaylor, Cody W, PA-C        Lab Results:  Results for orders placed or performed during the hospital encounter of 09/30/20 (from the past 48 hour(s))  T4, free     Status: Abnormal   Collection Time: 09/30/20  6:11 PM  Result Value Ref Range   Free T4 0.53 (L) 0.61 - 1.12 ng/dL    Comment: (NOTE) Biotin ingestion may interfere with free T4 tests. If the results are inconsistent with the TSH level, previous test results, or the clinical presentation, then consider biotin interference. If needed, order repeat testing after stopping biotin. Performed at [facility], [address] [place], [place] [address]   Hemoglobin A1c     Status: None   Collection Time: 09/30/20  6:11 PM  Result Value Ref Range   Hgb A1c MFr Bld 5.4 4.8 - 5.6 %    Comment: (NOTE) Pre diabetes:          5.7%-6.4%  Diabetes:              >6.4%  Glycemic control for   <7.0% adults with diabetes    Mean Plasma Glucose 108.28 mg/dL    Comment: Performed at [facility], [address] [place], [place] [address]  Lipid panel     Status: Abnormal   Collection Time: 09/30/20  6:11 PM  Result Value Ref Range   Cholesterol 188 0 - 200 mg/dL   Triglycerides 253181 (H) <150 mg/dL   HDL 53 >66>40 mg/dL   Total CHOL/HDL Ratio 3.5 RATIO   VLDL 36 0 - 40 mg/dL   LDL Cholesterol 99 0 - 99 mg/dL    Comment:        Total Cholesterol/HDL:CHD Risk Coronary Heart Disease Risk Table                     Men   Women  1/2 Average Risk   3.4   3.3  Average Risk       5.0   4.4  2 X Average Risk   9.6    7.1  3 X Average Risk  23.4   11.0        Use the calculated Patient Ratio above and the CHD Risk Table to determine the patient's CHD Risk.        ATP III CLASSIFICATION (LDL):  <100     mg/dL   Optimal  [id number]  mg/dL   Near or Above                    Optimal  130-159  mg/dL   Borderline  [id number]  mg/dL   High  >387>190     mg/dL   Very High Performed at [facility], [address] [place], [place] [address]   TSH     Status: None   Collection Time: 09/30/20  6:11 PM  Result Value Ref Range   TSH 3.327 0.350 - 4.500 uIU/mL    Comment: Performed by a 3rd Generation assay with a functional sensitivity of <=0.01 uIU/mL. Performed at [facility], [address] [place], [place] [address]     Blood Alcohol level:  Lab Results  Component Value Date   [facility] <10 09/29/2020    Metabolic Disorder Labs: Lab Results  Component Value Date   HGBA1C 5.4 09/30/2020   MPG 108.28 09/30/2020   MPG 116.89 03/18/2019   No results found for: PROLACTIN Lab Results  Component Value Date   CHOL 188 09/30/2020   TRIG 181 (H) 09/30/2020   HDL 53 09/30/2020   CHOLHDL 3.5 09/30/2020   VLDL 36 09/30/2020   LDLCALC 99 09/30/2020   LDLCALC 115 (H) 11/01/2017    Physical Findings: AIMS:  , ,  ,  ,    CIWA:    COWS:     Musculoskeletal: Strength & Muscle Tone: within normal limits Gait & Station: normal Patient leans: Front  Psychiatric Specialty Exam: Psychiatric Specialty Exam: Physical Exam Vitals and nursing note reviewed.  Constitutional:      Appearance: Normal appearance. She is obese.  HENT:     Head: Normocephalic and atraumatic.  Cardiovascular:     Rate and Rhythm: Normal rate.     Pulses: Normal pulses.  Musculoskeletal:     Cervical back: Normal range of motion.  Neurological:     Mental Status: She is alert.    Review of Systems  Constitutional: Negative.   HENT: Negative.    Eyes: Negative.   Respiratory: Negative.     Cardiovascular: Negative.   Gastrointestinal: Negative.   Genitourinary: Negative.   Musculoskeletal: Negative.   Skin: Negative.   Neurological: Negative.    Blood pressure 110/83, pulse 90, temperature 97.8 F (36.6 C), temperature source Oral, resp. rate 20, height 5\' 2"  (1.575 m), weight 98 kg, SpO2 98 %.Body mass index is 39.5 kg/m.  General Appearance: Casual, Fairly Groomed, and Neat  Eye Contact:  Good  Speech:  Clear and Coherent and Normal Rate  Volume:  Normal  Mood:  Anxious, Depressed, and reported more anxiety than depression  Affect:  Non-Congruent  Thought Process:  Coherent, Goal Directed, and Linear  Orientation:  Full (Time, Place, and Person)  Thought Content:  Logical  Suicidal Thoughts:  No  Homicidal Thoughts:  No  Memory:  Immediate;   Good Recent;   Good Remote;   Good  Judgement:  Fair  Insight:  Good  Psychomotor Activity:  Normal  Concentration:  Concentration: Good and Attention Span: Good  Recall:  Good  Fund of Knowledge:  Good  Language:  Good  Akathisia:  NA  Handed:  Right  AIMS (if indicated):     Assets:  Communication Skills Desire for Improvement Housing Physical Health Resilience  ADL's:  Intact  Cognition:  WNL  Sleep:  Number of Hours: 6.75    Presentation  General Appearance: Appropriate for Environment  Eye Contact:Good  Speech:Clear and Coherent; Normal Rate  Speech Volume:Normal  Handedness: No data recorded  Mood and Affect  Mood:Anxious  Affect:Congruent   Thought Process  Thought Processes:Coherent; Goal Directed  Descriptions of Associations:Intact  Orientation:Full (Time, Place and Person)  Thought Content:Logical; Rumination (Rumination regarding relationship stressors)  History of Schizophrenia/Schizoaffective disorder:No  Duration of Psychotic Symptoms:No data recorded Hallucinations:No data recorded  Ideas of Reference:None  Suicidal Thoughts:No data recorded  Homicidal Thoughts:No  data recorded   Sensorium  Memory:Immediate Good; Recent Good; Remote Good  Judgment:Fair  Insight:Fair   Executive Functions  Concentration:Good  Attention Span:Good  Recall:Good  Fund of Knowledge:Fair  Language:Good   Psychomotor Activity  Psychomotor Activity:No data recorded   Assets  Assets:Communication Skills; Desire for Improvement; Housing; Resilience; Social Support   Sleep  Sleep:No data recorded    Physical Exam: Physical Exam Vitals and nursing note reviewed.  Constitutional:      Appearance: Normal appearance. She is obese.  HENT:     Head: Normocephalic and atraumatic.  Cardiovascular:     Rate and Rhythm: Normal rate.     Pulses: Normal pulses.  Musculoskeletal:     Cervical back: Normal range of motion.  Neurological:     Mental Status: She is alert.   Review of Systems  Constitutional: Negative.   HENT: Negative.    Eyes: Negative.   Respiratory: Negative.    Cardiovascular: Negative.   Gastrointestinal: Negative.   Genitourinary: Negative.   Musculoskeletal: Negative.   Skin: Negative.   Neurological: Negative.   Endo/Heme/Allergies: Negative.   Blood pressure 110/83, pulse 90, temperature 97.8 F (36.6 C), temperature source Oral, resp. rate 20, height  (1.575 m), weight 98 kg, SpO2 98 %. Body mass index is 39.5 kg/m.   Treatment Plan Summary: Daily contact with patient to assess and evaluate symptoms and progress in treatment and Medication management  Continue Fluoxetine Liquid 20 mg po daily for depression and anxiety Continue Claritin tablet 10 mg po daily for allergy  Continue Trazodone 50 mg po as needed for sleep Continue Hydroxyzine 25 mg po tid as needed for anxiety. Offer other PRN medications as needed per protocol. Encourage oral fluid intake-HR slightly tachycardia.  Earney Navy, NP 10/02/2020, 1:42 PM

## 2020-10-02 NOTE — Progress Notes (Signed)
D- Patient alert and oriented. Patient affect/mood reported as depression 0 and anxiety 1. Denies SI, HI, AVH, and pain. Patient Goal:  " to continue to stay positive as I am today"   A- Scheduled medications administered to patient, per MD orders. Support and encouragement provided.  Routine safety checks conducted every 15 minutes.  Patient informed to notify staff with problems or concerns.  R- No adverse drug reactions noted. Patient contracts for safety at this time. Patient compliant with medications and treatment plan. Patient receptive, calm, and cooperative. Patient interacts well with others on the unit.  Patient remains safe at this time.             [place] NOVEL CORONAVIRUS (COVID-19) DAILY CHECK-OFF SYMPTOMS - answer yes or no to each - every day NO YES  Have you had a fever in the past 24 hours?  Fever (Temp > 37.80C / 100F) X    Have you had any of these symptoms in the past 24 hours? New Cough  Sore Throat   Shortness of Breath  Difficulty Breathing  Unexplained Body Aches   X    Have you had any one of these symptoms in the past 24 hours not related to allergies?   Runny Nose  Nasal Congestion  Sneezing   X    If you have had runny nose, nasal congestion, sneezing in the past 24 hours, has it worsened?   X    EXPOSURES - check yes or no X    Have you traveled outside the state in the past 14 days?   X    Have you been in contact with someone with a confirmed diagnosis of COVID-19 or PUI in the past 14 days without wearing appropriate PPE?   X    Have you been living in the same home as a person with confirmed diagnosis of COVID-19 or a PUI (household contact)?     X    Have you been diagnosed with COVID-19?     X                                                                                                                             What to do next: Answered NO to all: Answered YES to anything:    Proceed with unit schedule Follow the [facility] Inpatient  Flowsheet.

## 2020-10-02 NOTE — [facility] Group Notes (Signed)
Patient did not attend morning goals group, although she was made aware of it.  

## 2020-10-02 NOTE — Plan of Care (Signed)
  Problem: Education: Goal: Emotional status will improve Outcome: Progressing Goal: Mental status will improve Outcome: Progressing   

## 2020-10-02 NOTE — [facility] Group Notes (Signed)
[facility] Group Notes:  (Nursing/MHT/Case Management/Adjunct)  Date:  10/02/2020  Time:  2:41 PM  Type of Therapy:  Nurse Education  Participation Level:  Active  Participation Quality:  Appropriate and Attentive  Affect:  Appropriate  Cognitive:  Alert and Appropriate  Insight:  Improving  Engagement in Group:  Engaged  Modes of Intervention:  Discussion and Education  Summary of Progress/Problems:  Discussed sleep hygiene.  Handout provided. She arrived late to group but participated well with the conversation. Norm Parcel Antonio Woodhams 10/02/2020, 2:41 PM

## 2020-10-03 DIAGNOSIS — F4323 Adjustment disorder with mixed anxiety and depressed mood: Secondary | ICD-10-CM | POA: Diagnosis not present

## 2020-10-03 MED ORDER — FLUOXETINE HCL 20 MG/5ML PO SOLN: 40.0000 mg | Freq: Every day | ORAL | 0 refills | Status: DC

## 2020-10-03 MED ORDER — FLUOXETINE HCL 40 MG PO CAPS: 40.0000 mg | ORAL_CAPSULE | Freq: Every day | ORAL | 0 refills | Status: DC

## 2020-10-03 MED ORDER — FLUOXETINE HCL 20 MG PO CAPS
40.0000 mg | ORAL_CAPSULE | Freq: Every day | ORAL | Status: DC
  Filled 2020-10-03 (×2): qty 2

## 2020-10-03 NOTE — [facility] Suicide Risk Assessment (Signed)
[facility] INPATIENT:  Family/Significant Other Suicide Prevention Education  Suicide Prevention Education:  Education Completed; Patent examiner [phone] (Sister) has been identified by the patient as the family member/significant other with whom the patient will be residing, and identified as the person(s) who will aid the patient in the event of a mental health crisis (suicidal ideations/suicide attempt).  With written consent from the patient, the family member/significant other has been provided the following suicide prevention education, prior to the and/or following the discharge of the patient.  The suicide prevention education provided includes the following: Suicide risk factors Suicide prevention and interventions National Suicide Hotline telephone number [facility] assessment telephone number [facility] Emergency Assistance 911 [facility] and/or Residential Mobile Crisis Unit telephone number  Request made of family/significant other to: Remove weapons (e.g., guns, rifles, knives), all items previously/currently identified as safety concern.   Remove drugs/medications (over-the-counter, prescriptions, illicit drugs), all items previously/currently identified as a safety concern.  The family member/significant other verbalizes understanding of the suicide prevention education information provided.  The family member/significant other agrees to remove the items of safety concern listed above.  CSW spoke with Mrs. Cirillo who states that her sister has experienced Depression in the past due to some issues with her body (such as her teeth) and self-esteem issues from her ex-boyfriend and youngest child's father because he often says rude or mean things that cause her to be self-conscious.  Mrs. Hottinger states that she speaks with her sister daily and see's her in person at least 5 times a week.  She states that she knows the signs of Depression and suicidal thoughts  and agrees to bring her back to the hospital if it is needed.  Mrs. Albarracin states that there are no firearms or weapons in the home. CSW completed SPE with Mrs. Alverson.   [employer] Kung Melayah Skorupski 10/03/2020, 10:33 AM

## 2020-10-03 NOTE — Discharge Summary (Signed)
Physician Discharge Summary Note  Patient:  [name] is an 40 y.o., female MRN:  [id number] DOB:  Feb 27, 1981 Patient phone:  [phone] (home)  Patient address:   [address] [place] [address],  Total Time spent with patient: 30 minutes  Date of Admission:  09/30/2020 Date of Discharge: 10/03/2020  Reason for Admission:  (From MD's admission note): [name] is a 40 year old female with self-reported history of anxiety and panic attacks who presented to [facility] ED BIB [place] under IVC taken out by her mother for making suicidal threats to overdose on fentanyl after having an argument with her ex-boyfriend who is the father of one of her children.  BAL was negative.  UDS was negative.  Per ED notes, collateral information obtained from mother indicates that patient has been depressed for a long time, has made statements about being tired of living and made statements about killing herself a few months ago.  Mother stated patient would likely minimize symptoms when speaking with ED staff.  Patient was transferred this morning to [facility] for further evaluation and treatment and safety of patient.  On interview with me this morning, the patient is cooperative and polite, makes good eye contact and presents with constricted affect.  She perseverates on conflict with her ex boyfriend of 5-1/2 years who is the father of one of her children.  Their interactions worsened several days ago and patient felt sad, hurt, mad and bad about herself.  Patient states she became tearful and said something about overdosing on fentanyl because her sister's boyfriend overdosed on fentanyl.  Patient denies access to fentanyl.  She denies suicidal ideation or passive wish for death this morning and states she would never kill herself because of her children.  She does report significant negative feelings about herself and feelings of worthlessness which are exacerbated when she has problematic  interactions with her ex-boyfriend.  Patient denies persistently depressed mood, anhedonia, change in sleep, change in appetite, problems with energy, problems with concentration, AI, HI, PI, AH or VH.  She reports ongoing problems with anxiety that started when she was in high school.  Patient reports being picked on in high school and having anxiety when she had to speak in class.  She feels nervous when she is around groups of people and worries how they will perceive her.  Patient experiences panic attacks with increased heart rate, racing heart, shortness of breath, feeling jittery and nervous.  Patient has had intermittent treatment for anxiety with SSRIs since 2015 or 2016.  Most recently she has been taking Prozac  daily for anxiety prescribed by her primary care provider [facility] internal medicine, Dr. Lorette Ang).  She finds Prozac to be helpful, her anxiety is less intense but she continues to experience anxiety intermittently.  Patient does not have a current therapist although she had a therapist in the past and found therapy to be helpful.  She is receptive to increasing her Prozac dose to target symptoms of anxiety and possible depressive symptoms.  She also is receptive to participating in therapy as an outpatient.  Patient denies use of alcohol, tobacco, marijuana or any drugs.  Principal Problem: Adjustment disorder with mixed anxiety and depressed mood Discharge Diagnoses: Principal Problem:   Adjustment disorder with mixed anxiety and depressed mood Active Problems:   MDD (major depressive disorder), recurrent severe, without psychosis (HCC)   Panic attacks   Social anxiety disorder   Past Psychiatric History: See H&P  Past Medical History:  Past Medical History:  Diagnosis Date   Anemia    Anxiety "fast hearbeat"   took Benadryl at end of pregnancy   Back pain affecting pregnancy in first trimester 05/27/2015   Complication of anesthesia    Epidural 1 sided   Depression     During 1 pregnancy   Diabetes mellitus without complication (HCC)    Endometriosis    Gallstones    Gestational diabetes    just started checking BS 03/05/12   Headache(784.0)    Hematuria 05/27/2015   History of kidney stones    Irregular periods    Nausea 01/27/2015   Pregnancy induced hypertension    Pregnant 01/27/2015   Recurrent upper respiratory infection (URI)    Mostly when pregnant   Spotting affecting pregnancy in first trimester 01/27/2015    Past Surgical History:  Procedure Laterality Date   CHOLECYSTECTOMY N/A 03/20/2019   Procedure: LAPAROSCOPIC CHOLECYSTECTOMY;  Surgeon: Franky Macho, MD;  Location: [facility];  Service: General;  Laterality: N/A;   MULTIPLE TOOTH EXTRACTIONS     WISDOM TOOTH EXTRACTION     Family History:  Family History  Problem Relation Age of Onset   Heart disease Maternal Grandmother    Cancer Maternal Grandmother        brain   Heart failure Maternal Grandmother    Breast cancer Maternal Grandmother    Cancer Paternal Grandmother        brain   Hypertension Mother    Anxiety disorder Mother    Depression Mother    Hypertension Father    Anxiety disorder Sister    ADD / ADHD Daughter    Asthma Daughter    ADD / ADHD Daughter    Seizures Daughter    ADD / ADHD Daughter    ADD / ADHD Daughter    Family Psychiatric  History: See H&P Social History:  Social History   Substance and Sexual Activity  Alcohol Use No   Alcohol/week: 0.0 standard drinks   Comment: not while pregnant     Social History   Substance and Sexual Activity  Drug Use No    Social History   Socioeconomic History   Marital status: Divorced    Spouse name: Not on file   Number of children: Not on file   Years of education: Not on file   Highest education level: Not on file  Occupational History   Not on file  Tobacco Use   Smoking status: Former    Packs/day: 0.10    Years: 0.50    Pack years: 0.05    Types: Cigarettes    Start date: 02/19/1998     Quit date: 09/17/1998    Years since quitting: 22.0   Smokeless tobacco: Never  Vaping Use   Vaping Use: Never used  Substance and Sexual Activity   Alcohol use: No    Alcohol/week: 0.0 standard drinks    Comment: not while pregnant   Drug use: No   Sexual activity: Yes    Partners: Male    Birth control/protection: None  Other Topics Concern   Not on file  Social History Narrative   Lives in [place], [place]   Does not work outside home.   Married for over 3 years.    Wear seatbelt.   Eats all food groups.   Enjoys dance.    Social Determinants of Health   Financial Resource Strain: Low Risk    Difficulty of Paying Living Expenses: Not very hard  Food  Insecurity: No Food Insecurity   Worried About Programme researcher, broadcasting/film/video in the Last Year: Never true   Ran Out of Food in the Last Year: Never true  Transportation Needs: No Transportation Needs   Lack of Transportation (Medical): No   Lack of Transportation (Non-Medical): No  Physical Activity: Sufficiently Active   Days of Exercise per Week: 5 days   Minutes of Exercise per Session: 30 min  Stress: Unknown   Feeling of Stress : Patient refused  Social Connections: Unknown   Frequency of Communication with Friends and Family: More than three times a week   Frequency of Social Gatherings with Friends and Family: Three times a week   Attends Religious Services: Never   Active Member of Clubs or Organizations: No   Attends Banker Meetings: Patient refused   Marital Status: Patient refused    Hospital Course:  After the above admission evaluation, Hatsue's presenting symptoms were noted. She was recommended for mood stabilization treatments. The medication regimen targeting those presenting symptoms were discussed with her & initiated with her consent. She was started on Prozac for depression. She has Trazodone PRN for sleep. Her UDS and BAL on arrival to the ED were negative. She was however medicated, stabilized  & discharged on the medications as listed on her discharge medication list below. Besides the mood stabilization treatments, Andrianna was also enrolled & participated in the group counseling sessions being offered & held on this unit. She learned coping skills. She presented no other significant pre-existing medical issues that required treatment. She tolerated her treatment regimen without any adverse effects or reactions reported.   During the course of his hospitalization, the 15-minute checks were adequate to ensure patient's safety. Jeneane did not display any dangerous, violent or suicidal behavior on the unit. She interacted with patients & staff appropriately, participated appropriately in the group sessions/therapies. Her medications were addressed & adjusted to meet her needs. She was recommended for outpatient follow-up care & medication management upon discharge to assure continuity of care & mood stability.  At the time of discharge patient is not reporting any acute suicidal/homicidal ideations. She feels more confident about her self-care & in managing his mental health. She currently denies any new issues or concerns. Education and supportive counseling provided throughout his/her hospital stay & upon discharge.   Today upon her discharge evaluation with the attending psychiatrist, Rudell shares she is doing well. She denies any other specific concerns. She is sleeping well. Her appetite is good. She denies other physical complaints. She denies AH/VH, delusional thoughts or paranoia. She does not appear to be responding to any internal stimuli. She feels that her medications have been helpful & is in agreement to continue her current treatment regimen as recommended. She was able to engage in safety planning including plan to return to [facility] or contact emergency services if he/she feels unable to maintain her own safety or the safety of others. Pt had no further questions, comments, or concerns. She  left [facility] with all personal belongings in no apparent distress. Transportation per private vehicle with her daughter.    Physical Findings: AIMS:  , ,  ,  ,    CIWA:    COWS:     Musculoskeletal: Strength & Muscle Tone: within normal limits Gait & Station: normal Patient leans: N/A  Psychiatric Specialty Exam:  Presentation  General Appearance: Appropriate for Environment; Neat  Eye Contact:Good  Speech:Clear and Coherent; Normal Rate  Speech Volume:Normal  Handedness: No  data recorded  Mood and Affect  Mood:Euthymic  Affect:Appropriate; Full Range  Thought Process  Thought Processes:Coherent; Goal Directed  Descriptions of Associations:Intact  Orientation:Full (Time, Place and Person)  Thought Content:Logical  History of Schizophrenia/Schizoaffective disorder:No  Duration of Psychotic Symptoms:No data recorded Hallucinations:Hallucinations: None  Ideas of Reference:None  Suicidal Thoughts:Suicidal Thoughts: No  Homicidal Thoughts:Homicidal Thoughts: No  Sensorium  Memory:Immediate Good; Recent Good; Remote Good  Judgment:Good  Insight:Good  Executive Functions  Concentration:Good  Attention Span:Good  Recall:Good  Fund of Knowledge:Good  Language:Good  Psychomotor Activity  Psychomotor Activity:Psychomotor Activity: Normal  Assets  Assets:Communication Skills; Desire for Improvement; Housing; Physical Health; Resilience; Social Support  Sleep  Sleep:Sleep: Good Number of Hours of Sleep: 6.75  Physical Exam: Physical Exam Vitals and nursing note reviewed.  Constitutional:      Appearance: Normal appearance.  HENT:     Head: Normocephalic.  Pulmonary:     Effort: Pulmonary effort is normal.  Musculoskeletal:        General: Normal range of motion.     Cervical back: Normal range of motion.  Neurological:     General: No focal deficit present.     Mental Status: She is alert and oriented to person, place, and time.   Psychiatric:        Attention and Perception: Attention normal. She does not perceive auditory or visual hallucinations.        Mood and Affect: Mood and affect normal.        Speech: Speech normal.        Behavior: Behavior normal. Behavior is cooperative.        Thought Content: Thought content normal. Thought content is not paranoid or delusional. Thought content does not include homicidal or suicidal ideation. Thought content does not include homicidal or suicidal plan.        Cognition and Memory: Cognition normal.   Review of Systems  Constitutional: Negative.   HENT: Negative.  Negative for congestion and sinus pain.   Respiratory: Negative.  Negative for cough.   Cardiovascular: Negative.  Negative for chest pain.  Gastrointestinal: Negative.   Genitourinary: Negative.   Musculoskeletal: Negative.   Neurological: Negative.   Blood pressure 121/84, pulse 96, temperature 98 F (36.7 C), temperature source Oral, resp. rate 20, height 5\' 2"  (1.575 m), weight 98 kg, SpO2 100 %. Body mass index is 39.5 kg/m.   Social History   Tobacco Use  Smoking Status Former   Packs/day: 0.10   Years: 0.50   Pack years: 0.05   Types: Cigarettes   Start date: 02/19/1998   Quit date: 09/17/1998   Years since quitting: 22.0  Smokeless Tobacco Never   Tobacco Cessation:  N/A, patient does not currently use tobacco products   Blood Alcohol level:  Lab Results  Component Value Date   ETH <10 09/29/2020    Metabolic Disorder Labs:  Lab Results  Component Value Date   HGBA1C 5.4 09/30/2020   MPG 108.28 09/30/2020   MPG 116.89 03/18/2019   No results found for: PROLACTIN Lab Results  Component Value Date   CHOL 188 09/30/2020   TRIG 181 (H) 09/30/2020   HDL 53 09/30/2020   CHOLHDL 3.5 09/30/2020   VLDL 36 09/30/2020   LDLCALC 99 09/30/2020   LDLCALC 115 (H) 11/01/2017    See Psychiatric Specialty Exam and Suicide Risk Assessment completed by Attending Physician prior to  discharge.  Discharge destination:  Home  Is patient on multiple antipsychotic therapies at discharge:  No  Has Patient had three or more failed trials of antipsychotic monotherapy by history:  No  Recommended Plan for Multiple Antipsychotic Therapies: NA   Allergies as of 10/03/2020       Reactions   Sulfamethoxazole-trimethoprim Swelling, Rash        Medication List     STOP taking these medications    acetaminophen 325 MG tablet Commonly known as: TYLENOL   FLUoxetine 20 MG/5ML solution Commonly known as: PROZAC Replaced by: FLUoxetine 40 MG capsule   fluticasone 50 MCG/ACT nasal spray Commonly known as: FLONASE   ibuprofen 400 MG tablet Commonly known as: ADVIL       TAKE these medications      Indication  azelastine 0.1 % nasal spray Commonly known as: ASTELIN Place 1 spray into both nostrils 2 (two) times daily. Use in each nostril as directed  Indication: Hayfever   FLUoxetine 40 MG capsule Commonly known as: PROZAC Take 1 capsule (40 mg total) by mouth daily. Replaces: FLUoxetine 20 MG/5ML solution  Indication: Depression   loratadine 10 MG tablet Commonly known as: CLARITIN Take 10 mg by mouth at bedtime.  Indication: Hayfever        Follow-up Information     Services, [facility]. Go on 10/05/2020.   Why: You have a hospital follow up appointment for therapy and medication management services on 10/05/20 at 10:30 am.    This appointment will be held in person. Contact information: [address] [place] [address] [phone]                 Follow-up recommendations:  Activity:  as tolerated Diet:  Heart healthy  Comments:  Prescriptions were given at discharge.  Patient is agreeable with the discharge plan.  She was given opportunity to ask questions.  She appears to feel comfortable with discharge and denies any current suicidal or homicidal thoughts.   Patient is instructed prior to discharge to: Take all  medications as prescribed by her mental healthcare provider. Report any adverse effects and or reactions from the medicines to her outpatient provider promptly. Patient has been instructed & cautioned: To not engage in alcohol and or illegal drug use while on prescription medicines. In the event of worsening symptoms, patient is instructed to call the crisis hotline, 911 and or go to the nearest ED for appropriate evaluation and treatment of symptoms. To follow-up with her primary care provider for your other medical issues, concerns and or health care needs.   Signed: Laveda Abbe, NP 10/03/2020, 6:38 PM

## 2020-10-03 NOTE — Progress Notes (Signed)
  [facility] Adult Case Management Discharge Plan :  Will you be returning to the same living situation after discharge:  Yes,  Home At discharge, do you have transportation home?: Yes,  Daughter Do you have the ability to pay for your medications: Yes,  Medicaid  Release of information consent forms completed and in the chart;  Patient's signature needed at discharge.  Patient to Follow up at:  Follow-up Information     [facility], [facility]. Go on 10/05/2020.   Why: You have a hospital follow up appointment for therapy and medication management services on 10/05/20 at 10:30 am.    This appointment will be held in person. Contact information: [address] [place] [address] [phone]                 Next level of care provider has access to [facility] Link:no  Safety Planning and Suicide Prevention discussed: Yes,  with patient and sister     Has patient been referred to the Quitline?: Patient refused referral  Patient has been referred for addiction treatment: N/A  Aram Beecham, LCSWA 10/03/2020, 10:23 AM

## 2020-10-03 NOTE — Plan of Care (Signed)
Discharge note  Patient verbalizes readiness for discharge. Follow up plan explained, AVS, Transition record and SRA given. Prescriptions and teaching provided. Belongings returned and signed for. Suicide safety plan completed and signed. Patient verbalizes understanding. Patient denies SI/HI and assures this Clinical research associate they will seek assistance should that change. Patient discharged to lobby where daughter was waiting.  Problem: Education: Goal: Knowledge of [place] [facility] Education information/materials will improve Outcome: Adequate for Discharge Goal: Emotional status will improve Outcome: Adequate for Discharge Goal: Mental status will improve Outcome: Adequate for Discharge Goal: Verbalization of understanding the information provided will improve Outcome: Adequate for Discharge   Problem: Activity: Goal: Interest or engagement in activities will improve Outcome: Adequate for Discharge Goal: Sleeping patterns will improve Outcome: Adequate for Discharge   Problem: Coping: Goal: Ability to verbalize frustrations and anger appropriately will improve Outcome: Adequate for Discharge Goal: Ability to demonstrate self-control will improve Outcome: Adequate for Discharge   Problem: Health Behavior/Discharge Planning: Goal: Identification of resources available to assist in meeting health care needs will improve Outcome: Adequate for Discharge Goal: Compliance with treatment plan for underlying cause of condition will improve Outcome: Adequate for Discharge   Problem: Physical Regulation: Goal: Ability to maintain clinical measurements within normal limits will improve Outcome: Adequate for Discharge   Problem: Safety: Goal: Periods of time without injury will increase Outcome: Adequate for Discharge   Problem: Education: Goal: Utilization of techniques to improve thought processes will improve Outcome: Adequate for Discharge Goal: Knowledge of the prescribed therapeutic  regimen will improve Outcome: Adequate for Discharge   Problem: Activity: Goal: Interest or engagement in leisure activities will improve Outcome: Adequate for Discharge Goal: Imbalance in normal sleep/wake cycle will improve Outcome: Adequate for Discharge   Problem: Coping: Goal: Coping ability will improve Outcome: Adequate for Discharge Goal: Will verbalize feelings Outcome: Adequate for Discharge   Problem: Health Behavior/Discharge Planning: Goal: Ability to make decisions will improve Outcome: Adequate for Discharge Goal: Compliance with therapeutic regimen will improve Outcome: Adequate for Discharge   Problem: Role Relationship: Goal: Will demonstrate positive changes in social behaviors and relationships Outcome: Adequate for Discharge   Problem: Safety: Goal: Ability to disclose and discuss suicidal ideas will improve Outcome: Adequate for Discharge Goal: Ability to identify and utilize support systems that promote safety will improve Outcome: Adequate for Discharge   Problem: Self-Concept: Goal: Will verbalize positive feelings about self Outcome: Adequate for Discharge Goal: Level of anxiety will decrease Outcome: Adequate for Discharge   Problem: Education: Goal: Ability to state activities that reduce stress will improve Outcome: Adequate for Discharge   Problem: Coping: Goal: Ability to identify and develop effective coping behavior will improve Outcome: Adequate for Discharge   Problem: Self-Concept: Goal: Ability to identify factors that promote anxiety will improve Outcome: Adequate for Discharge Goal: Level of anxiety will decrease Outcome: Adequate for Discharge Goal: Ability to modify response to factors that promote anxiety will improve Outcome: Adequate for Discharge   Problem: Activity: Goal: Will identify at least one activity in which they can participate Outcome: Adequate for Discharge   Problem: Coping: Goal: Ability to identify  and develop effective coping behavior will improve Outcome: Adequate for Discharge Goal: Ability to interact with others will improve Outcome: Adequate for Discharge Goal: Demonstration of participation in decision-making regarding own care will improve Outcome: Adequate for Discharge Goal: Ability to use eye contact when communicating with others will improve Outcome: Adequate for Discharge   Problem: Health Behavior/Discharge Planning: Goal: Identification of resources available  to assist in meeting health care needs will improve Outcome: Adequate for Discharge   Problem: Self-Concept: Goal: Will verbalize positive feelings about self Outcome: Adequate for Discharge

## 2020-10-03 NOTE — [facility] Group Notes (Signed)
    ADULT GRIEF GROUP NOTE:   Spiritual care group on grief and loss facilitated by chaplain Dyanne Carrel, [facility]   Group Goal:   Support / Education around grief and loss   Members engage in facilitated group support and psycho-social education.   Group Description:   Following introductions and group rules, group members engaged in facilitated group dialog and support around topic of loss, with particular support around experiences of loss in their lives. Group Identified types of loss (relationships / self / things) and identified patterns, circumstances, and changes that precipitate losses. Reflected on thoughts / feelings around loss, normalized grief responses, and recognized variety in grief experience. Group noted Worden's four tasks of grief in discussion.   Group drew on Adlerian / Rogerian, narrative, MI,   Patient progress: Emika participated in group and was engaged in conversation.    Chaplain Dyanne Carrel, [facility] Pager, [phone] 1:13 PM

## 2020-10-03 NOTE — Progress Notes (Signed)
Pt did not attend goals group. 
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
## 2020-10-03 NOTE — Progress Notes (Signed)
Recreation Therapy Notes  Date:  6.27.22 Time: 0930  Location: [address] Dayroom  Group Topic: Stress Management  Goal Area(s) Addresses:  Patient will identify positive stress management techniques. Patient will identify benefits of using stress management post d/c.  Behavioral Response: Attentive  Intervention: Stress Management  Activity : Meditation.  LRT played a meditation that focused on being resilient when faced with adversity.     Education:  Stress Management, Discharge Planning.   Education Outcome: Acknowledges Education  Clinical Observations/Feedback: Pt attended and participated.  Pt had no questions and expressed no concerns.    Caroll Rancher, LRT/CTRS         Caroll Rancher A 10/03/2020 12:42 PM

## 2020-10-03 NOTE — Progress Notes (Signed)
   10/02/20 2130  Psych Admission Type (Psych Patients Only)  Admission Status Involuntary  Psychosocial Assessment  Patient Complaints Other (Comment) (pt c/o roomate taking her belongings, Clinical research associate spoke w/roomate)  Eye Contact Fair  Facial Expression Flat  Affect Appropriate to circumstance  Speech Unremarkable  Interaction Assertive  Motor Activity Slow  Appearance/Hygiene Improved  Behavior Characteristics Cooperative;Appropriate to situation  Mood Pleasant  Thought Process  Coherency WDL  Content WDL  Delusions None reported or observed  Perception WDL  Hallucination None reported or observed  Judgment WDL  Confusion None  Danger to Self  Current suicidal ideation? Denies  Danger to Others  Danger to Others None reported or observed

## 2020-10-06 DIAGNOSIS — F419 Anxiety disorder, unspecified: Secondary | ICD-10-CM | POA: Diagnosis not present

## 2020-10-06 DIAGNOSIS — R4589 Other symptoms and signs involving emotional state: Secondary | ICD-10-CM | POA: Diagnosis not present

## 2020-10-06 DIAGNOSIS — Z299 Encounter for prophylactic measures, unspecified: Secondary | ICD-10-CM | POA: Diagnosis not present

## 2020-10-06 DIAGNOSIS — Z6835 Body mass index (BMI) 35.0-35.9, adult: Secondary | ICD-10-CM | POA: Diagnosis not present

## 2020-10-06 DIAGNOSIS — Z789 Other specified health status: Secondary | ICD-10-CM | POA: Diagnosis not present

## 2020-10-24 DIAGNOSIS — F4323 Adjustment disorder with mixed anxiety and depressed mood: Secondary | ICD-10-CM | POA: Diagnosis not present

## 2020-11-01 DIAGNOSIS — Z299 Encounter for prophylactic measures, unspecified: Secondary | ICD-10-CM | POA: Diagnosis not present

## 2020-11-01 DIAGNOSIS — F419 Anxiety disorder, unspecified: Secondary | ICD-10-CM | POA: Diagnosis not present

## 2020-11-01 DIAGNOSIS — Z6835 Body mass index (BMI) 35.0-35.9, adult: Secondary | ICD-10-CM | POA: Diagnosis not present

## 2020-11-01 DIAGNOSIS — J309 Allergic rhinitis, unspecified: Secondary | ICD-10-CM | POA: Diagnosis not present

## 2020-12-30 DIAGNOSIS — Z0000 Encounter for general adult medical examination without abnormal findings: Secondary | ICD-10-CM | POA: Diagnosis not present

## 2020-12-30 DIAGNOSIS — Z6835 Body mass index (BMI) 35.0-35.9, adult: Secondary | ICD-10-CM | POA: Diagnosis not present

## 2020-12-30 DIAGNOSIS — F419 Anxiety disorder, unspecified: Secondary | ICD-10-CM | POA: Diagnosis not present

## 2020-12-30 DIAGNOSIS — Z299 Encounter for prophylactic measures, unspecified: Secondary | ICD-10-CM | POA: Diagnosis not present

## 2020-12-30 DIAGNOSIS — Z1331 Encounter for screening for depression: Secondary | ICD-10-CM | POA: Diagnosis not present

## 2020-12-30 DIAGNOSIS — Z789 Other specified health status: Secondary | ICD-10-CM | POA: Diagnosis not present

## 2020-12-30 DIAGNOSIS — Z23 Encounter for immunization: Secondary | ICD-10-CM | POA: Diagnosis not present

## 2020-12-30 DIAGNOSIS — Z79899 Other long term (current) drug therapy: Secondary | ICD-10-CM | POA: Diagnosis not present

## 2021-01-02 DIAGNOSIS — Z79899 Other long term (current) drug therapy: Secondary | ICD-10-CM | POA: Diagnosis not present

## 2021-01-02 DIAGNOSIS — F419 Anxiety disorder, unspecified: Secondary | ICD-10-CM | POA: Diagnosis not present

## 2021-02-07 ENCOUNTER — Other Ambulatory Visit: Payer: Self-pay | Admitting: Internal Medicine

## 2021-02-07 DIAGNOSIS — Z139 Encounter for screening, unspecified: Secondary | ICD-10-CM

## 2021-02-14 ENCOUNTER — Ambulatory Visit
Admission: RE | Admit: 2021-02-14 | Discharge: 2021-02-14 | Disposition: A | Discharge status: Home / Self Care | Payer: Medicaid Other | Source: Ambulatory Visit | Attending: Internal Medicine | Admitting: Internal Medicine

## 2021-02-14 ENCOUNTER — Other Ambulatory Visit: Payer: Self-pay

## 2021-02-14 DIAGNOSIS — Z1231 Encounter for screening mammogram for malignant neoplasm of breast: Secondary | ICD-10-CM | POA: Diagnosis not present

## 2021-02-14 DIAGNOSIS — Z139 Encounter for screening, unspecified: Secondary | ICD-10-CM

## 2021-03-22 DIAGNOSIS — Z299 Encounter for prophylactic measures, unspecified: Secondary | ICD-10-CM | POA: Diagnosis not present

## 2021-03-22 DIAGNOSIS — R109 Unspecified abdominal pain: Secondary | ICD-10-CM | POA: Diagnosis not present

## 2021-03-22 DIAGNOSIS — Z789 Other specified health status: Secondary | ICD-10-CM | POA: Diagnosis not present

## 2021-03-22 DIAGNOSIS — M67432 Ganglion, left wrist: Secondary | ICD-10-CM | POA: Diagnosis not present

## 2021-03-22 DIAGNOSIS — Z6835 Body mass index (BMI) 35.0-35.9, adult: Secondary | ICD-10-CM | POA: Diagnosis not present

## 2021-06-21 DIAGNOSIS — R109 Unspecified abdominal pain: Secondary | ICD-10-CM | POA: Diagnosis not present

## 2021-06-21 DIAGNOSIS — Z882 Allergy status to sulfonamides status: Secondary | ICD-10-CM | POA: Diagnosis not present

## 2021-06-21 DIAGNOSIS — O99891 Other specified diseases and conditions complicating pregnancy: Secondary | ICD-10-CM | POA: Diagnosis not present

## 2021-06-21 DIAGNOSIS — R1031 Right lower quadrant pain: Secondary | ICD-10-CM | POA: Diagnosis not present

## 2021-06-21 DIAGNOSIS — Z3A01 Less than 8 weeks gestation of pregnancy: Secondary | ICD-10-CM | POA: Diagnosis not present

## 2021-06-21 DIAGNOSIS — O26891 Other specified pregnancy related conditions, first trimester: Secondary | ICD-10-CM | POA: Diagnosis not present

## 2021-06-22 ENCOUNTER — Telehealth: Payer: Self-pay | Admitting: Women's Health

## 2021-06-22 ENCOUNTER — Other Ambulatory Visit: Payer: Self-pay | Admitting: Women's Health

## 2021-06-22 MED ORDER — DOXYLAMINE-PYRIDOXINE 10-10 MG PO TBEC: DELAYED_RELEASE_TABLET | ORAL | 6 refills | Status: DC

## 2021-06-22 NOTE — Telephone Encounter (Signed)
Discussed pt's meds with Joellyn Haff. ? ?Returned pt's call, two identifiers used. Informed pt of need to stop allergy meds and relayed info regarding Prozac and possible heart defects in fetus. Pt confirmed understanding and will taper Prozac down to 20 mg daily for a week and then none as instructed by Joellyn Haff. Pt needed some nausea med. Will forward request to provider. ?

## 2021-06-22 NOTE — Telephone Encounter (Signed)
Patient would like for somebody to call to discuss some medications she is taking right now. She is coming in her for her new ob appt on April 27th. Pregnancy was confirmed at [facility].  ?

## 2021-06-22 NOTE — Telephone Encounter (Signed)
Returned pt's call, two identifiers used. Informed pt of nausea med sent to pharmacy. Pt asked who would treat her STD and informed her that the ED provider would send something in for her as soon as they got the report. Pt confirmed understanding. ?

## 2021-07-06 DIAGNOSIS — F419 Anxiety disorder, unspecified: Secondary | ICD-10-CM | POA: Diagnosis not present

## 2021-07-06 DIAGNOSIS — Z299 Encounter for prophylactic measures, unspecified: Secondary | ICD-10-CM | POA: Diagnosis not present

## 2021-07-06 DIAGNOSIS — H6591 Unspecified nonsuppurative otitis media, right ear: Secondary | ICD-10-CM | POA: Diagnosis not present

## 2021-07-06 DIAGNOSIS — Z789 Other specified health status: Secondary | ICD-10-CM | POA: Diagnosis not present

## 2021-07-06 DIAGNOSIS — Z6837 Body mass index (BMI) 37.0-37.9, adult: Secondary | ICD-10-CM | POA: Diagnosis not present

## 2021-08-02 ENCOUNTER — Encounter: Payer: Self-pay | Admitting: Women's Health

## 2021-08-02 ENCOUNTER — Other Ambulatory Visit: Payer: Self-pay | Admitting: Obstetrics & Gynecology

## 2021-08-02 DIAGNOSIS — Z3490 Encounter for supervision of normal pregnancy, unspecified, unspecified trimester: Secondary | ICD-10-CM | POA: Insufficient documentation

## 2021-08-02 DIAGNOSIS — O0990 Supervision of high risk pregnancy, unspecified, unspecified trimester: Secondary | ICD-10-CM | POA: Insufficient documentation

## 2021-08-02 DIAGNOSIS — Z3682 Encounter for antenatal screening for nuchal translucency: Secondary | ICD-10-CM

## 2021-08-03 ENCOUNTER — Encounter: Payer: Self-pay | Admitting: Women's Health

## 2021-08-03 ENCOUNTER — Ambulatory Visit (INDEPENDENT_AMBULATORY_CARE_PROVIDER_SITE_OTHER): Payer: Medicaid Other

## 2021-08-03 ENCOUNTER — Ambulatory Visit: Payer: Medicaid Other | Admitting: *Deleted

## 2021-08-03 ENCOUNTER — Ambulatory Visit (INDEPENDENT_AMBULATORY_CARE_PROVIDER_SITE_OTHER): Payer: Medicaid Other | Admitting: Women's Health

## 2021-08-03 VITALS — BP 141/89 | HR 91 | Wt 233.0 lb

## 2021-08-03 DIAGNOSIS — O10919 Unspecified pre-existing hypertension complicating pregnancy, unspecified trimester: Secondary | ICD-10-CM

## 2021-08-03 DIAGNOSIS — O09299 Supervision of pregnancy with other poor reproductive or obstetric history, unspecified trimester: Secondary | ICD-10-CM

## 2021-08-03 DIAGNOSIS — Z3682 Encounter for antenatal screening for nuchal translucency: Secondary | ICD-10-CM

## 2021-08-03 DIAGNOSIS — Z31430 Encounter of female for testing for genetic disease carrier status for procreative management: Secondary | ICD-10-CM | POA: Diagnosis not present

## 2021-08-03 DIAGNOSIS — F418 Other specified anxiety disorders: Secondary | ICD-10-CM | POA: Diagnosis not present

## 2021-08-03 DIAGNOSIS — O09521 Supervision of elderly multigravida, first trimester: Secondary | ICD-10-CM

## 2021-08-03 DIAGNOSIS — Z8632 Personal history of gestational diabetes: Secondary | ICD-10-CM | POA: Diagnosis not present

## 2021-08-03 DIAGNOSIS — Z3480 Encounter for supervision of other normal pregnancy, unspecified trimester: Secondary | ICD-10-CM

## 2021-08-03 DIAGNOSIS — Z3A12 12 weeks gestation of pregnancy: Secondary | ICD-10-CM | POA: Diagnosis not present

## 2021-08-03 DIAGNOSIS — Z8759 Personal history of other complications of pregnancy, childbirth and the puerperium: Secondary | ICD-10-CM

## 2021-08-03 DIAGNOSIS — A749 Chlamydial infection, unspecified: Secondary | ICD-10-CM | POA: Insufficient documentation

## 2021-08-03 DIAGNOSIS — O0991 Supervision of high risk pregnancy, unspecified, first trimester: Secondary | ICD-10-CM

## 2021-08-03 DIAGNOSIS — Z3481 Encounter for supervision of other normal pregnancy, first trimester: Secondary | ICD-10-CM | POA: Diagnosis not present

## 2021-08-03 HISTORY — DX: Chlamydial infection, unspecified: A74.9

## 2021-08-03 LAB — POCT URINALYSIS DIPSTICK OB
Blood, UA: NEGATIVE
Glucose, UA: NEGATIVE
Ketones, UA: NEGATIVE
Leukocytes, UA: NEGATIVE
Nitrite, UA: NEGATIVE
POC,PROTEIN,UA: NEGATIVE

## 2021-08-03 MED ORDER — LABETALOL HCL 200 MG PO TABS: 200.0000 mg | ORAL_TABLET | Freq: Two times a day (BID) | ORAL | 3 refills | Status: DC

## 2021-08-03 MED ORDER — ASPIRIN 81 MG PO TBEC: 162.0000 mg | DELAYED_RELEASE_TABLET | Freq: Every day | ORAL | 2 refills | Status: DC

## 2021-08-03 MED ORDER — BLOOD PRESSURE MONITOR MISC: 0 refills | Status: DC
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
## 2021-08-03 NOTE — Progress Notes (Signed)
[place] 12+1 wks,measurements c/w dates,FHR 170 bpm,NB present,NT 1.3 mm,CRL 60.87 mm,normal ovaries,anterior placenta  ?

## 2021-08-03 NOTE — Patient Instructions (Signed)
[name], thank you for choosing our office today! We appreciate the opportunity to meet your healthcare needs. You may receive a short survey by mail, e-mail, or through [employer]. If you are happy with your care we would appreciate if you could take just a few minutes to complete the survey questions. We read all of your comments and take your feedback very seriously. Thank you again for choosing our office.  ?Center for [employer] Team at [facility] ? [facility] ?([address] [place], [place] [address]) ?Entrance C, located off of [address] ?Free 24/7 valet parking  ? Nausea & Vomiting ?Have saltine crackers or pretzels by your bed and eat a few bites before you raise your head out of bed in the morning ?Eat small frequent meals throughout the day instead of large meals ?Drink plenty of fluids throughout the day to stay hydrated, just don't drink a lot of fluids with your meals.  This can make your stomach fill up faster making you feel sick ?Do not brush your teeth right after you eat ?Products with real ginger are good for nausea, like ginger ale and ginger hard candy Make sure it says made with real ginger! ?Sucking on sour candy like lemon heads is also good for nausea ?If your prenatal vitamins make you nauseated, take them at night so you will sleep through the nausea ?Sea Bands ?If you feel like you need medicine for the nausea & vomiting please let us know ?If you are unable to keep any fluids or food down please let us know ? ? Constipation ?Drink plenty of fluid, preferably water, throughout the day ?Eat foods high in fiber such as fruits, vegetables, and grains ?Exercise, such as walking, is a good way to keep your bowels regular ?Drink warm fluids, especially warm prune juice, or decaf coffee ?Eat a 1/2 cup of real oatmeal (not instant), 1/2 cup applesauce, and 1/2-1 cup warm prune juice every day ?If needed, you may take Colace (docusate sodium) stool softener  once or twice a day to help keep the stool soft.  ?If you still are having problems with constipation, you may take Miralax once daily as needed to help keep your bowels regular.  ? ?Home Blood Pressure Monitoring for Patients  ? ?Your provider has recommended that you check your blood pressure (BP) at least once a week at home. If you do not have a blood pressure cuff at home, one will be provided for you. Contact your provider if you have not received your monitor within 1 week.  ? ?Helpful Tips for Accurate Home Blood Pressure Checks  ?Don't smoke, exercise, or drink caffeine 30 minutes before checking your BP ?Use the restroom before checking your BP (a full bladder can raise your pressure) ?Relax in a comfortable upright chair ?Feet on the ground ?Left arm resting comfortably on a flat surface at the level of your heart ?Legs uncrossed ?Back supported ?Sit quietly and don't talk ?Place the cuff on your bare arm ?Adjust snuggly, so that only two fingertips can fit between your skin and the top of the cuff ?Check 2 readings separated by at least one minute ?Keep a log of your BP readings ?For a visual, please reference this diagram: http://ccnc.care/bpdiagram ? ?Provider Name: [facility]     Phone: [phone] ? ?Zone 1: ALL CLEAR  ?Continue to monitor your symptoms:  ?BP reading is less than 140 (top number) or less than 90 (bottom  number)  ?No right upper stomach pain ?No headaches or seeing spots ?No feeling nauseated or throwing up ?No swelling in face and hands ? ?Zone 2: CAUTION ?Call your doctor's office for any of the following:  ?BP reading is greater than 140 (top number) or greater than 90 (bottom number)  ?Stomach pain under your ribs in the middle or right side ?Headaches or seeing spots ?Feeling nauseated or throwing up ?Swelling in face and hands ? ?Zone 3: EMERGENCY  ?Seek immediate medical care if you have any of the following:  ?BP reading is greater than160 (top number) or greater than  110 (bottom number) ?Severe headaches not improving with Tylenol ?Serious difficulty catching your breath ?Any worsening symptoms from Zone 2  ? ? First Trimester of Pregnancy ?The first trimester of pregnancy is from week 1 until the end of week 12 (months 1 through 3). A week after a sperm fertilizes an egg, the egg will implant on the wall of the uterus. This embryo will begin to develop into a baby. Genes from you and your partner are forming the baby. The female genes determine whether the baby is a boy or a girl. At 6-8 weeks, the eyes and face are formed, and the heartbeat can be seen on ultrasound. At the end of 12 weeks, all the baby's organs are formed.  ?Now that you are pregnant, you will want to do everything you can to have a healthy baby. Two of the most important things are to get good prenatal care and to follow your health care provider's instructions. Prenatal care is all the medical care you receive before the baby's birth. This care will help prevent, find, and treat any problems during the pregnancy and childbirth. ?BODY CHANGES ?Your body goes through many changes during pregnancy. The changes vary from woman to woman.  ?You may gain or lose a couple of pounds at first. ?You may feel sick to your stomach (nauseous) and throw up (vomit). If the vomiting is uncontrollable, call your health care provider. ?You may tire easily. ?You may develop headaches that can be relieved by medicines approved by your health care provider. ?You may urinate more often. Painful urination may mean you have a bladder infection. ?You may develop heartburn as a result of your pregnancy. ?You may develop constipation because certain hormones are causing the muscles that push waste through your intestines to slow down. ?You may develop hemorrhoids or swollen, bulging veins (varicose veins). ?Your breasts may begin to grow larger and become tender. Your nipples may stick out more, and the tissue that surrounds them  (areola) may become darker. ?Your gums may bleed and may be sensitive to brushing and flossing. ?Dark spots or blotches (chloasma, mask of pregnancy) may develop on your face. This will likely fade after the baby is born. ?Your menstrual periods will stop. ?You may have a loss of appetite. ?You may develop cravings for certain kinds of food. ?You may have changes in your emotions from day to day, such as being excited to be pregnant or being concerned that something may go wrong with the pregnancy and baby. ?You may have more vivid and strange dreams. ?You may have changes in your hair. These can include thickening of your hair, rapid growth, and changes in texture. Some women also have hair loss during or after pregnancy, or hair that feels dry or thin. Your hair will most likely return to normal after your baby is born. ?WHAT TO EXPECT AT YOUR PRENATAL  VISITS ?During a routine prenatal visit: ?You will be weighed to make sure you and the baby are growing normally. ?Your blood pressure will be taken. ?Your abdomen will be measured to track your baby's growth. ?The fetal heartbeat will be listened to starting around week 10 or 12 of your pregnancy. ?Test results from any previous visits will be discussed. ?Your health care provider may ask you: ?How you are feeling. ?If you are feeling the baby move. ?If you have had any abnormal symptoms, such as leaking fluid, bleeding, severe headaches, or abdominal cramping. ?If you have any questions. ?Other tests that may be performed during your first trimester include: ?Blood tests to find your blood type and to check for the presence of any previous infections. They will also be used to check for low iron levels (anemia) and Rh antibodies. Later in the pregnancy, blood tests for diabetes will be done along with other tests if problems develop. ?Urine tests to check for infections, diabetes, or protein in the urine. ?An ultrasound to confirm the proper growth and development  of the baby. ?An amniocentesis to check for possible genetic problems. ?Fetal screens for spina bifida and Down syndrome. ?You may need other tests to make sure you and the baby are doing well. ?HOME CARE

## 2021-08-04 ENCOUNTER — Encounter: Payer: Self-pay | Admitting: Women's Health

## 2021-08-04 DIAGNOSIS — R7303 Prediabetes: Secondary | ICD-10-CM | POA: Insufficient documentation

## 2021-08-05 LAB — CBC/D/PLT+RPR+RH+ABO+RUBIGG...
Antibody Screen: NEGATIVE
Basophils Absolute: 0 10*3/uL (ref 0.0–0.2)
Basos: 1 %
EOS (ABSOLUTE): 0.1 10*3/uL (ref 0.0–0.4)
Eos: 1 %
HCV Ab: NONREACTIVE
HIV Screen 4th Generation wRfx: NONREACTIVE
Hematocrit: 38.1 % (ref 34.0–46.6)
Hemoglobin: 12.5 g/dL (ref 11.1–15.9)
Hepatitis B Surface Ag: NEGATIVE
Immature Grans (Abs): 0 10*3/uL (ref 0.0–0.1)
Immature Granulocytes: 0 %
Lymphocytes Absolute: 1.4 10*3/uL (ref 0.7–3.1)
Lymphs: 20 %
MCH: 28.5 pg (ref 26.6–33.0)
MCHC: 32.8 g/dL (ref 31.5–35.7)
MCV: 87 fL (ref 79–97)
Monocytes Absolute: 0.6 10*3/uL (ref 0.1–0.9)
Monocytes: 9 %
Neutrophils Absolute: 4.8 10*3/uL (ref 1.4–7.0)
Neutrophils: 69 %
Platelets: 286 10*3/uL (ref 150–450)
RBC: 4.38 x10E6/uL (ref 3.77–5.28)
RDW: 14.5 % (ref 11.7–15.4)
RPR Ser Ql: NONREACTIVE
Rh Factor: NEGATIVE
Rubella Antibodies, IGG: 3.81 index (ref >0.99)
WBC: 7 10*3/uL (ref 3.4–10.8)

## 2021-08-05 LAB — INTEGRATED 1
Crown Rump Length: 60.9 mm
Gest. Age on Collection Date: 12.4 weeks
Maternal Age at EDD: 42 yr
Nuchal Translucency (NT): 1.3 mm
Number of Fetuses: 1
PAPP-A Value: 357.1 ng/mL
Weight: 233 [lb_av]

## 2021-08-05 LAB — COMPREHENSIVE METABOLIC PANEL
ALT: 16 IU/L (ref 0–32)
AST: 14 IU/L (ref 0–40)
Albumin/Globulin Ratio: 1.4 (ref 1.2–2.2)
Albumin: 4.2 g/dL (ref 3.8–4.8)
Alkaline Phosphatase: 44 IU/L (ref 44–121)
BUN/Creatinine Ratio: 15 (ref 9–23)
BUN: 7 mg/dL (ref 6–24)
Bilirubin Total: 0.2 mg/dL (ref 0.0–1.2)
CO2: 20 mmol/L (ref 20–29)
Calcium: 9.7 mg/dL (ref 8.7–10.2)
Chloride: 101 mmol/L (ref 96–106)
Creatinine, Ser: 0.46 mg/dL — ABNORMAL LOW (ref 0.57–1.00)
Globulin, Total: 3 g/dL (ref 1.5–4.5)
Glucose: 86 mg/dL (ref 70–99)
Potassium: 4.2 mmol/L (ref 3.5–5.2)
Sodium: 136 mmol/L (ref 134–144)
Total Protein: 7.2 g/dL (ref 6.0–8.5)
eGFR: 123 mL/min/{1.73_m2} (ref >59)

## 2021-08-05 LAB — HEMOGLOBIN A1C
Est. average glucose Bld gHb Est-mCnc: 117 mg/dL
Hgb A1c MFr Bld: 5.7 % — ABNORMAL HIGH (ref 4.8–5.6)

## 2021-08-05 LAB — PROTEIN / CREATININE RATIO, URINE
Creatinine, Urine: 61.5 mg/dL
Protein, Ur: 15.7 mg/dL
Protein/Creat Ratio: 255 mg/g creat — ABNORMAL HIGH (ref 0–200)

## 2021-08-05 LAB — HCV INTERPRETATION

## 2021-08-06 LAB — GC/CHLAMYDIA PROBE AMP
Chlamydia trachomatis, NAA: NEGATIVE
Neisseria Gonorrhoeae by PCR: NEGATIVE

## 2021-08-08 ENCOUNTER — Encounter: Payer: Self-pay | Admitting: *Deleted

## 2021-08-08 DIAGNOSIS — O0991 Supervision of high risk pregnancy, unspecified, first trimester: Secondary | ICD-10-CM | POA: Diagnosis not present

## 2021-08-08 LAB — URINE CULTURE

## 2021-08-08 NOTE — Progress Notes (Signed)
Erroneous encounter

## 2021-08-09 ENCOUNTER — Other Ambulatory Visit: Payer: Medicaid Other

## 2021-08-09 DIAGNOSIS — R7309 Other abnormal glucose: Secondary | ICD-10-CM | POA: Diagnosis not present

## 2021-08-10 LAB — GLUCOSE TOLERANCE, 2 HOURS W/ 1HR
Glucose, 1 hour: 200 mg/dL — ABNORMAL HIGH (ref 70–179)
Glucose, 2 hour: 173 mg/dL — ABNORMAL HIGH (ref 70–152)
Glucose, Fasting: 86 mg/dL (ref 70–91)

## 2021-08-11 ENCOUNTER — Encounter: Payer: Self-pay | Admitting: Women's Health

## 2021-08-14 ENCOUNTER — Encounter: Payer: Self-pay | Admitting: Women's Health

## 2021-08-14 ENCOUNTER — Other Ambulatory Visit: Payer: Self-pay | Admitting: *Deleted

## 2021-08-14 DIAGNOSIS — Z3A13 13 weeks gestation of pregnancy: Secondary | ICD-10-CM

## 2021-08-14 DIAGNOSIS — O24410 Gestational diabetes mellitus in pregnancy, diet controlled: Secondary | ICD-10-CM

## 2021-08-14 DIAGNOSIS — O24419 Gestational diabetes mellitus in pregnancy, unspecified control: Secondary | ICD-10-CM | POA: Insufficient documentation

## 2021-08-14 MED ORDER — ACCU-CHEK GUIDE ME W/DEVICE KIT: 1.0000 | PACK | Freq: Four times a day (QID) | 0 refills | Status: DC

## 2021-08-14 MED ORDER — ACCU-CHEK FASTCLIX LANCETS MISC: 12 refills | Status: DC

## 2021-08-14 MED ORDER — ACCU-CHEK GUIDE VI STRP: ORAL_STRIP | 12 refills | Status: DC

## 2021-08-15 ENCOUNTER — Encounter: Payer: Medicaid Other | Attending: Women's Health | Admitting: Registered"

## 2021-08-15 ENCOUNTER — Ambulatory Visit (INDEPENDENT_AMBULATORY_CARE_PROVIDER_SITE_OTHER): Payer: Medicaid Other | Admitting: Registered"

## 2021-08-15 DIAGNOSIS — Z3A13 13 weeks gestation of pregnancy: Secondary | ICD-10-CM | POA: Diagnosis not present

## 2021-08-15 DIAGNOSIS — O24410 Gestational diabetes mellitus in pregnancy, diet controlled: Secondary | ICD-10-CM | POA: Diagnosis not present

## 2021-08-15 DIAGNOSIS — Z713 Dietary counseling and surveillance: Secondary | ICD-10-CM | POA: Insufficient documentation

## 2021-08-15 NOTE — Progress Notes (Signed)
Patient was seen for Gestational Diabetes self-management on 08/15/21  ?Start time 0915 and End time 1020  ? ?Estimated due date: 02/14/22; [redacted]w[redacted]d ? ?Clinical: ?Medications: reviewed ?Medical History: h/o 5th preg GDM diet controlled ?Labs: OGTT 86/200H/173H, A1c 5.7%  ? ?Dietary and Lifestyle History: ?Pt states doesn't drink soda, water or flavored water even before pregnancy. Pt reports she went to GDM class 2013. ? ?Pt states she is having cravings for spicy foods. Pt states she was eating bbq chicken nuggets and wants to know if can still have it with GDM. ? ?Pt states she is having a gender reveal party on May 21 and wants to have a follow-up appointment before then so she won't be worrying too much at the celebration. ? ?Pt states she hasn't been doing structured activity due to nausea but plans to start walking more ? ?Physical Activity: ADL ?Stress: "a lot" ?Sleep: not assessed ? ?24 hr Recall:  ?First Meal: scrambled eggs, cheese toast, 1/2 c 2% milk ?Snack: none or crackers ?Second meal: chicken sandwich Lynnae Sandhoff [facility]) ?Snack: ?Third meal: chicken, carrots, no rice or noodles (120 mg/dL) ?Snack: none ?Beverages: water, milk ? ?NUTRITION INTERVENTION  ?Nutrition education (E-1) on the following topics:  ? ?Initial Follow-up ? ?[x]  []  Definition of Gestational Diabetes ?[x]  []  Why dietary management is important in controlling blood glucose ?[x]  []  Effects each nutrient has on blood glucose levels ?[x]  []  Simple carbohydrates vs complex carbohydrates ?[x]  []  Fluid intake ?[x]  []  Creating a balanced meal plan ?[x]  []  Carbohydrate counting  ?[x]  []  When to check blood glucose levels ?[x]  []  Proper blood glucose monitoring techniques ?[x]  []  Effect of stress and stress reduction techniques  ?[x]  []  Exercise effect on blood glucose levels, appropriate exercise during pregnancy ?[x]  []  Importance of limiting caffeine and abstaining from alcohol and smoking ?[x]  []  Medications used for blood sugar control during  pregnancy ?[x]  []  Hypoglycemia and rule of 15 ?[x]  []  Postpartum self care ? ?Patient already has a meter, is testing pre breakfast and 2 hours after each meal. ?FBS: 94 ?Postprandial: 124 mg/dL 2 hrs after above breakfast; 109 after dinner last night (no carbs) ? ?Patient instructed to monitor glucose levels: ?FBS: 60 - ? 95 mg/dL (some clinics use 90 for cutoff) ?1 hour: ? 140 mg/dL ?2 hour: ? 120 mg/dL ? ?Patient received handouts: ?Nutrition Diabetes and Pregnancy ?Carbohydrate Counting List ? ?Patient will be seen for follow-up as needed.  ?

## 2021-08-24 ENCOUNTER — Other Ambulatory Visit: Payer: Medicaid Other

## 2021-08-26 ENCOUNTER — Other Ambulatory Visit: Payer: Self-pay

## 2021-08-26 ENCOUNTER — Inpatient Hospital Stay (HOSPITAL_COMMUNITY)
Admission: AD | Admit: 2021-08-26 | Discharge: 2021-08-26 | Disposition: A | Discharge status: Home / Self Care | Payer: Medicaid Other | Attending: Emergency Medicine | Admitting: Emergency Medicine

## 2021-08-26 ENCOUNTER — Encounter (HOSPITAL_COMMUNITY): Payer: Self-pay | Admitting: Obstetrics & Gynecology

## 2021-08-26 ENCOUNTER — Inpatient Hospital Stay (HOSPITAL_COMMUNITY): Payer: Medicaid Other

## 2021-08-26 DIAGNOSIS — O24112 Pre-existing diabetes mellitus, type 2, in pregnancy, second trimester: Secondary | ICD-10-CM | POA: Insufficient documentation

## 2021-08-26 DIAGNOSIS — Z01818 Encounter for other preprocedural examination: Secondary | ICD-10-CM | POA: Diagnosis not present

## 2021-08-26 DIAGNOSIS — O132 Gestational [pregnancy-induced] hypertension without significant proteinuria, second trimester: Secondary | ICD-10-CM | POA: Insufficient documentation

## 2021-08-26 DIAGNOSIS — Z9049 Acquired absence of other specified parts of digestive tract: Secondary | ICD-10-CM | POA: Insufficient documentation

## 2021-08-26 DIAGNOSIS — R079 Chest pain, unspecified: Secondary | ICD-10-CM | POA: Insufficient documentation

## 2021-08-26 DIAGNOSIS — R0789 Other chest pain: Secondary | ICD-10-CM | POA: Diagnosis not present

## 2021-08-26 DIAGNOSIS — O99412 Diseases of the circulatory system complicating pregnancy, second trimester: Secondary | ICD-10-CM | POA: Diagnosis not present

## 2021-08-26 DIAGNOSIS — Z3A15 15 weeks gestation of pregnancy: Secondary | ICD-10-CM | POA: Diagnosis not present

## 2021-08-26 DIAGNOSIS — I4510 Unspecified right bundle-branch block: Secondary | ICD-10-CM

## 2021-08-26 DIAGNOSIS — R0602 Shortness of breath: Secondary | ICD-10-CM | POA: Diagnosis not present

## 2021-08-26 LAB — TROPONIN I (HIGH SENSITIVITY): Troponin I (High Sensitivity): 3 ng/L (ref <18)

## 2021-08-26 LAB — CBC
HCT: 38.1 % (ref 36.0–46.0)
Hemoglobin: 12.3 g/dL (ref 12.0–15.0)
MCH: 28.7 pg (ref 26.0–34.0)
MCHC: 32.3 g/dL (ref 30.0–36.0)
MCV: 89 fL (ref 80.0–100.0)
Platelets: 241 10*3/uL (ref 150–400)
RBC: 4.28 MIL/uL (ref 3.87–5.11)
RDW: 14.9 % (ref 11.5–15.5)
WBC: 8.7 10*3/uL (ref 4.0–10.5)
nRBC: 0 % (ref 0.0–0.2)

## 2021-08-26 LAB — BASIC METABOLIC PANEL
Anion gap: 8 (ref 5–15)
BUN: 9 mg/dL (ref 6–20)
CO2: 23 mmol/L (ref 22–32)
Calcium: 9.8 mg/dL (ref 8.9–10.3)
Chloride: 105 mmol/L (ref 98–111)
Creatinine, Ser: 0.45 mg/dL (ref 0.44–1.00)
GFR, Estimated: >60 mL/min (ref >60)
Glucose, Bld: 99 mg/dL (ref 70–99)
Potassium: 4.1 mmol/L (ref 3.5–5.1)
Sodium: 136 mmol/L (ref 135–145)

## 2021-08-26 LAB — D-DIMER, QUANTITATIVE: D-Dimer, Quant: 0.28 ug/mL-FEU (ref 0.00–0.50)

## 2021-08-26 NOTE — MAU Provider Note (Signed)
Event Date/Time   First Provider Initiated Contact with Patient 08/26/21 [phone]      S [name] is a 41 y.o. [id number] patient who presents to MAU today with complaint of chest pain & shortness of breath. Symptoms started Monday & worsened this morning. Reports intermittent mid chest pain that feels like pressure & at times radiates to her mid back. No aggravating symptoms. Also has worsening shortness of breath that causes dizziness. States she initially thought was related to allergies that worsened last week but this is worse than normal. Denies fever, n/v, cough. Does not smoke. Reports history of asthma years ago - no inhaler use.  Denies abdominal pain or vaginal bleeding.    O BP 130/76 (BP Location: Right Arm)   Pulse (!) 49   Temp 97.9 F (36.6 C) (Oral)   Resp 18   Ht 5\' 2"  (1.575 m)   Wt 103.1 kg   LMP 05/03/2021   SpO2 100%   BMI 41.57 kg/m  Physical Exam Vitals and nursing note reviewed.  Constitutional:      General: She is not in acute distress.    Appearance: She is well-developed.  Cardiovascular:     Rate and Rhythm: Bradycardia present.  Pulmonary:     Effort: Pulmonary effort is normal.     Breath sounds: Normal breath sounds. No wheezing.  Neurological:     Mental Status: She is alert.  Psychiatric:        Mood and Affect: Mood normal.        Behavior: Behavior normal.    A Medical screening exam complete 1. Chest pain, unspecified type   2. [redacted] weeks gestation of pregnancy      P Transfer to [facility] for evaluation of chest pain & SOB Report called to Dr. [facility], Karna Dupes, NP 08/26/2021 8:56 AM

## 2021-08-26 NOTE — Discharge Instructions (Addendum)
You came to the emergency department today to be evaluated for your chest pain and shortness of breath.  Your physical exam, lab results, EKG, and chest x-ray were reassuring.  Due to your reports of chest pain and shortness of breath I have placed a referral to cardiology.  You should hear from them in the next 2-3 business days.  If you do not hear back from them please call the number listed on to schedule a follow-up appointment.  Get help right away if: Your chest pain gets worse. You have a cough that gets worse, or you cough up blood. You have severe pain in your abdomen. You faint. You have sudden, unexplained chest discomfort. You have sudden, unexplained discomfort in your arms, back, neck, or jaw. You have shortness of breath at any time. You suddenly start to sweat, or your skin gets clammy. You feel nausea or you vomit. You suddenly feel lightheaded or dizzy. You have severe weakness, or unexplained weakness or fatigue. Your heart begins to beat quickly, or it feels like it is skipping beats.

## 2021-08-26 NOTE — MAU Note (Addendum)
[name] is a 41 y.o. at [redacted]w[redacted]d here in MAU reporting: she's having chest pain and SOB since yesterday.  Reports chest pain is intermittent, located in center of chest and feels like a heavy sensation.  States pain intermittently radiates into back between shoulder blades.  Reports unsure if pollen is the cause of worsening allergies.  States has hx of asthma, hasn't used inhaler for a while. Denies pregnancy related complaints, no VB or LOF   Onset of complaint: yesterday Pain score: 7/10 Vitals:   08/26/21 0848  BP: 130/76  Pulse: (!) 49  Resp: 18  Temp: 97.9 F (36.6 C)  SpO2: 100%     FHT:166 bpm Lab orders placed from triage:   None

## 2021-08-26 NOTE — ED Triage Notes (Signed)
Pt sent from MAU.  States she is [redacted]w[redacted]d pregnant.  Reports pain to center of chest and SOB that has been worse over the past 2 days.  States she is also having flare-up of seasonal allergies.

## 2021-08-26 NOTE — ED Provider Notes (Signed)
[place] EMERGENCY DEPARTMENT Provider Note   CSN: [id number] Arrival date & time: 08/26/21  8882     History  Chief Complaint  Patient presents with   Shortness of Breath   Chest Pain    [name] is a 41 y.o. female with a medical history of diabetes mellitus, gestational diabetes, depression, pregnancy-induced hypertension, status post cholecystectomy.  Patient is currently [redacted] weeks pregnant.  Presents to the emergency department with a chief complaint of chest pain and shortness of breath.  Patient reports that she has been dealing with chest pain and shortness of breath intermittently for "quite a while."  Patient reports that the symptoms preceded her becoming pregnant.  Patient states that she has noticed that the symptoms have been a little bit worse while pregnant.  Patient reports that she does have a history of anxiety and due to her pregnancy had to stop her SSRI.  Patient reports that yesterday morning she noticed her chest pain.  Pain had been intermittent throughout the day.  Pain became worse yesterday evening around 6 PM.  Patient states that she woke this morning at 6 PM and pain was present and again worse than it has been previously.  Patient states that pain is midsternal and radiates inferiorly.  Patient describes pain as a heaviness.  Patient reports that pain is worse with exertion.  Endorses associated shortness of breath.  Patient reports that this morning she felt like her heart was beating faster however denied any palpitations.  Denies any nausea, vomiting, diaphoresis, lightheadedness, syncope, leg swelling or tenderness, hemoptysis, abdominal pain, fever, chills, history of DVT/PE, surgery in the last 4 weeks, history of malignancy, tobacco use.   Shortness of Breath Associated symptoms: chest pain   Associated symptoms: no abdominal pain, no diaphoresis, no fever, no headaches, no neck pain, no rash and no vomiting   Chest  Pain Associated symptoms: shortness of breath   Associated symptoms: no abdominal pain, no back pain, no diaphoresis, no dizziness, no fever, no headache, no nausea, no palpitations and no vomiting       Home Medications Prior to Admission medications   Medication Sig Start Date End Date Taking? Authorizing Provider  Doxylamine-Pyridoxine (DICLEGIS) 10-10 MG TBEC 2 tabs q hs, if sx persist add 1 tab q am on day 3, if sx persist add 1 tab q afternoon on day 4 Patient not taking: Reported on 08/03/2021 06/22/21   Roma Schanz, CNM  Accu-Chek FastClix Lancets MISC Use as directed to check blood sugar 4 times daily 08/14/21   Roma Schanz, CNM  aspirin 81 MG EC tablet Take 2 tablets (162 mg total) by mouth daily. Swallow whole. 08/03/21   Roma Schanz, CNM  azelastine (ASTELIN) 0.1 % nasal spray Place 1 spray into both nostrils 2 (two) times daily. Use in each nostril as directed Patient not taking: Reported on 08/03/2021    [provider]  Blood Glucose Monitoring Suppl (ACCU-CHEK GUIDE ME) w/Device KIT 1 each by Does not apply route 4 (four) times daily. 08/14/21   Roma Schanz, CNM  Blood Pressure Monitor MISC For regular home bp monitoring during pregnancy 08/03/21   Roma Schanz, CNM  citalopram (CELEXA) 40 MG tablet Take 40 mg by mouth daily. Patient not taking: Reported on 08/03/2021 07/06/21   [provider]  glucose blood (ACCU-CHEK GUIDE) test strip Use as instructed to check blood sugar 4 times daily 08/14/21   Roma Schanz, CNM  labetalol (NORMODYNE) 200 MG tablet Take 1 tablet (200 mg total) by mouth 2 (two) times daily. 08/03/21   Roma Schanz, CNM  loratadine (CLARITIN) 10 MG tablet Take 10 mg by mouth at bedtime.  Patient not taking: Reported on 08/03/2021    [provider]  Pediatric Multivitamins-Iron [facility] COMPLETE PO) Take 2 tablets by mouth.    [provider]  medroxyPROGESTERone (PROVERA) 10 MG tablet  Take 1 daily for 10 days 01/19/20 09/30/20  Estill Dooms, NP      Allergies    Sulfamethoxazole-trimethoprim    Review of Systems   Review of Systems  Constitutional:  Negative for chills, diaphoresis and fever.  Eyes:  Negative for visual disturbance.  Respiratory:  Positive for shortness of breath.   Cardiovascular:  Positive for chest pain. Negative for palpitations and leg swelling.  Gastrointestinal:  Negative for abdominal pain, nausea and vomiting.  Musculoskeletal:  Negative for back pain and neck pain.  Skin:  Negative for color change and rash.  Neurological:  Negative for dizziness, syncope, light-headedness and headaches.  Psychiatric/Behavioral:  Negative for confusion.    Physical Exam Updated Vital Signs BP (!) 131/110   Pulse 92   Temp 98.4 F (36.9 C)   Resp 18   Ht _0  (1.575 m)   Wt 103.1 kg   LMP 05/03/2021   SpO2 100%   BMI 41.57 kg/m  Physical Exam Vitals and nursing note reviewed.  Constitutional:      General: She is not in acute distress.    Appearance: She is not ill-appearing, toxic-appearing or diaphoretic.  HENT:     Head: Normocephalic.  Eyes:     General: No scleral icterus.       Right eye: No discharge.        Left eye: No discharge.  Cardiovascular:     Rate and Rhythm: Normal rate.     Pulses:          Radial pulses are 2+ on the right side and 2+ on the left side.     Heart sounds: Normal heart sounds, S1 normal and S2 normal. Heart sounds not distant. No murmur heard. Pulmonary:     Effort: Pulmonary effort is normal. No tachypnea, bradypnea or respiratory distress.     Breath sounds: Normal breath sounds. No stridor.  Abdominal:     Comments: Gravid uterus with no tenderness, mass, pulsatile mass, guarding, or rebound tenderness.  Musculoskeletal:     Right lower leg: No swelling, deformity, lacerations, tenderness or bony tenderness. No edema.     Left lower leg: No swelling, deformity, lacerations, tenderness or  bony tenderness. No edema.  Skin:    General: Skin is warm and dry.  Neurological:     General: No focal deficit present.     Mental Status: She is alert.  Psychiatric:        Behavior: Behavior is cooperative.    ED Results / Procedures / Treatments   Labs (all labs ordered are listed, but only abnormal results are displayed) Labs Reviewed  BASIC METABOLIC PANEL  CBC  D-DIMER, QUANTITATIVE  TROPONIN I (HIGH SENSITIVITY)    EKG EKG Interpretation  Date/Time:  Saturday Aug 26 2021 09:19:00 EDT Ventricular Rate:  97 PR Interval:  152 QRS Duration: 78 QT Interval:  348 QTC Calculation: 441 R Axis:   34 Text Interpretation: Sinus rhythm with frequent Premature ventricular complexes Otherwise normal ECG When compared with ECG of 14-Aug-2015 13:11, PREVIOUS ECG IS  PRESENT when compared to prior, new PVCs. less delta wave than prior. No STEMI Confirmed by Antony Blackbird [phone]) on 08/26/2021 10:51:42 AM  Radiology DG Chest Portable 1 View  Result Date: 08/26/2021 CLINICAL DATA:  Chest pain for 2 days EXAM: PORTABLE CHEST - 1 VIEW COMPARISON:  09/25/2013 FINDINGS: Cardiomediastinal silhouette and pulmonary vasculature are within normal limits. Lungs are clear. IMPRESSION: No acute cardiopulmonary process. Electronically Signed   By: Miachel Roux M.D.   On: 08/26/2021 10:31    Procedures Procedures    Medications Ordered in ED Medications - No data to display  ED Course/ Medical Decision Making/ A&P                           Medical Decision Making Amount and/or Complexity of Data Reviewed Labs: ordered. Radiology: ordered.   Alert 41 year old female in no acute distress, nontoxic-appearing.  Presents to the emergency department with a chief complaint of chest pain and shortness of breath.  Information obtained from patient and patient's mother at bedside.  Past medical records were reviewed including previous provider notes, labs, and imaging.  Patient has medical  history as outlined in HPI which complicates her care.  Per chart review patient was seen at [facility]U prior to arrival at [facility] emergency department.  Patient had medical screening there and was [facility] for further evaluation of chest pain and shortness of breath.  Due to reports of chest pain and shortness of breath will initiate ACS work-up.  Additionally will check D-dimer to look for possible PE due to patient's current pregnancy.  I personally viewed and interpreted patient's EKG.  Tracing shows sinus rhythm with frequent PVCs.  I personally viewed and interpreted patient's chest x-ray.  Imaging shows no active cardiopulmonary disease.  I personally viewed and interpreted patient's lab results.  Pertinent findings include: -CBC, BMP unremarkable -D-dimer within normal limits -Troponin 3  Patient has heart score of 2.  Troponin within normal limits, EKG shows no signs of STEMI.  Low suspicion for ACS at this time.  Patient is low risk for PE, D-dimer within normal limits; low suspicion for PE at this time.  Hemodynamically stable at this time.  We will plan to discharge and have her follow-up with primary care provider and cards in outpatient setting.  Patient care discussed with attending physician Dr. Sherry Ruffing  Based on patient's chief complaint, I considered admission might be necessary, however after reassuring ED workup feel patient is reasonable for discharge.  Discussed results, findings, treatment and follow up. Patient advised of return precautions. Patient verbalized understanding and agreed with plan.  Portions of this note were generated with [employer]. Dictation errors may occur despite best attempts at proofreading.         Final Clinical Impression(s) / ED Diagnoses Final diagnoses:  Chest pain, unspecified type  [redacted] weeks gestation of pregnancy    Rx / DC Orders ED Discharge Orders          Ordered    Ambulatory referral to  Cardiology       Comments: If you have not heard from the Cardiology office within the next 72 hours please call [phone].   08/26/21 1111              Loni Beckwith, PA-C 08/26/21 1643    Tegeler, Gwenyth Allegra, MD 08/27/21 1534

## 2021-08-29 ENCOUNTER — Encounter: Payer: Medicaid Other | Attending: Women's Health | Admitting: Registered"

## 2021-08-29 ENCOUNTER — Ambulatory Visit (INDEPENDENT_AMBULATORY_CARE_PROVIDER_SITE_OTHER): Payer: Medicaid Other | Admitting: Registered"

## 2021-08-29 DIAGNOSIS — Z713 Dietary counseling and surveillance: Secondary | ICD-10-CM | POA: Diagnosis not present

## 2021-08-29 DIAGNOSIS — Z3A13 13 weeks gestation of pregnancy: Secondary | ICD-10-CM | POA: Diagnosis not present

## 2021-08-29 DIAGNOSIS — O24410 Gestational diabetes mellitus in pregnancy, diet controlled: Secondary | ICD-10-CM | POA: Insufficient documentation

## 2021-08-29 NOTE — Progress Notes (Signed)
Patient was seen for Gestational Diabetes self-management on 08/15/21  Start time 1520 and End time 1550  Estimated due date: 02/14/22; [redacted]w[redacted]d  Clinical: Medications: reviewed Medical History: h/o 5th preg GDM diet controlled Labs: OGTT 86/200H/173H, A1c 5.7%      Dietary and Lifestyle History: Pt states she has been using her log to help her see what foods work, sometimes feels like she is not sure what to eat to keep blood sugar under control and afraid to eat. Most of her readings look good, on 5/22 drank little pineapple juice and diluted with water, 133 mg/dL reading.  Pt states she hasn't walked since Fri when wasn't feeling well which could explain why the fasting numbers started going up.  Pt seemed a little discouraged because she was able to control with diet last time she had GDM.  Physical Activity: ADL  Stress: "a lot" Sleep: not assessed  24 hr Recall:  First Meal: 5/22 - scrambled eggs, biscuit and gravy, 1 small sausage, water; 5/23 - 2 pieces of cheese toast, 4 oz milk Snack: none  Second meal: 6 buffalo spicy wings, squash onions, water Snack: ~1T apple sauce Third meal: 2 chicken quesadillas (124 mg/dL) Snack: sugar-free popcicle 2 oz milk.  Beverages: water, 2% milk  NUTRITION INTERVENTION  Nutrition education (E-1) on the following topics:   Initial Follow-up  [x]  []  Definition of Gestational Diabetes [x]  []  Why dietary management is important in controlling blood glucose [x]  []  Effects each nutrient has on blood glucose levels [x]  []  Simple carbohydrates vs complex carbohydrates [x]  []  Fluid intake [x]  []  Creating a balanced meal plan [x]  []  Carbohydrate counting  [x]  []  When to check blood glucose levels [x]  []  Proper blood glucose monitoring techniques [x]  []  Effect of stress and stress reduction techniques  [x]  [x]  Exercise effect on blood glucose levels, appropriate exercise during pregnancy [x]  []  Importance of limiting caffeine and abstaining  from alcohol and smoking [x]  []  Medications used for blood sugar control during pregnancy [x]  []  Hypoglycemia and rule of 15 [x]  []  Postpartum self care  Also discussed hormonal changes that happen in 3rd trimester that would likely cause more difficulty controlling blood sugar and require medication to control.   Patient received handouts: none  Patient will be seen for follow-up in 4 weeks or as needed.

## 2021-08-30 NOTE — Progress Notes (Signed)
Cardiology Office Note   Date:  09/01/2021   ID:  Freddy Spadafora, DOB 1980-11-27, MRN [id number]  PCP:  Ralph Leyden, FNP  Cardiologist:   Makiyla Linch [place], MD   Chief Complaint  Patient presents with   New Patient (Initial Visit)        Chest Pain   Shortness of Breath      History of Present Illness: [name] is a 41 y.o. female who is seen at the request of Bernardo Heater FNP for evaluation of chest pain. She has a history of gestational DM and HTN. She is currently [redacted] weeks pregnant with her 7th pregnancy. She apparently had chest pain with pregnancy in 2017. Was seen by cardiology and Echo was normal. She is now on labetalol for high BP. Not on diabetic meds. Notes she had some chest pain prior to pregnancy but last week it got acutely worse. She went to the ED. Ecg was negative except for PE. CXR normal. Troponin and D dimer negative. She does not a lot of belching. No swelling. Denies any palpitations.   Past Medical History:  Diagnosis Date   Anemia    Anxiety "fast hearbeat"   took Benadryl at end of pregnancy   Back pain affecting pregnancy in first trimester 79/39/0300   Complication of anesthesia    Epidural 1 sided   Depression    During 1 pregnancy   Diabetes mellitus without complication ([place])    Endometriosis    Gallstones    Gestational diabetes    just started checking BS 03/05/12   Headache(784.0)    Hematuria 05/27/2015   History of kidney stones    Irregular periods    Pregnancy induced hypertension    Recurrent upper respiratory infection (URI)    Mostly when pregnant   Spotting affecting pregnancy in first trimester 01/27/2015    Past Surgical History:  Procedure Laterality Date   CHOLECYSTECTOMY N/A 03/20/2019   Procedure: LAPAROSCOPIC CHOLECYSTECTOMY;  Surgeon: Aviva Signs, MD;  Location: [facility];  Service: General;  Laterality: N/A;   MULTIPLE TOOTH EXTRACTIONS     WISDOM TOOTH EXTRACTION       Current Outpatient  Medications  Medication Sig Dispense Refill   Accu-Chek FastClix Lancets MISC Use as directed to check blood sugar 4 times daily 100 each 12   aspirin 81 MG EC tablet Take 2 tablets (162 mg total) by mouth daily. Swallow whole. 180 tablet 2   azelastine (ASTELIN) 0.1 % nasal spray Place 1 spray into both nostrils 2 (two) times daily. Use in each nostril as directed     Blood Glucose Monitoring Suppl (ACCU-CHEK GUIDE ME) w/Device KIT 1 each by Does not apply route 4 (four) times daily. 1 kit 0   Blood Pressure Monitor MISC For regular home bp monitoring during pregnancy 1 each 0   citalopram (CELEXA) 40 MG tablet Take 40 mg by mouth daily.     Doxylamine-Pyridoxine (DICLEGIS) 10-10 MG TBEC 2 tabs q hs, if sx persist add 1 tab q am on day 3, if sx persist add 1 tab q afternoon on day 4 100 tablet 6   glucose blood (ACCU-CHEK GUIDE) test strip Use as instructed to check blood sugar 4 times daily 50 each 12   labetalol (NORMODYNE) 200 MG tablet Take 1 tablet (200 mg total) by mouth 2 (two) times daily. 60 tablet 3   loratadine (CLARITIN) 10 MG tablet Take 10 mg by mouth at bedtime.     Pediatric  Multivitamins-Iron (FLINTSTONES COMPLETE PO) Take 2 tablets by mouth.     No current facility-administered medications for this visit.    Allergies:   Sulfamethoxazole-trimethoprim    Social History:  The patient  reports that she quit smoking about 22 years ago. Her smoking use included cigarettes. She started smoking about 23 years ago. She has a 0.05 pack-year smoking history. She has never used smokeless tobacco. She reports that she does not currently use alcohol. She reports that she does not use drugs.   Family History:  The patient's family history includes ADD / ADHD in her daughter, daughter, daughter, and daughter; Anxiety disorder in her mother and sister; Asthma in her daughter; Breast cancer in her maternal grandmother; Cancer in her maternal grandmother and paternal grandmother; Depression in  her mother; Heart disease in her maternal grandmother; Heart failure in her maternal grandmother; Hypertension in her father and mother; Seizures in her daughter.    ROS:  Please see the history of present illness.   Otherwise, review of systems are positive for none.   All other systems are reviewed and negative.    PHYSICAL EXAM: VS:  BP (!) 112/58 (BP Location: Right Arm, Patient Position: Sitting, Cuff Size: Large)   Pulse 97   Ht _0  (1.6 m)   Wt 230 lb (104.3 kg)   LMP 05/03/2021   BMI 40.74 kg/m  , BMI Body mass index is 40.74 kg/m. GEN: Well nourished, gravid,  in no acute distress HEENT: normal Neck: no JVD, carotid bruits, or masses Cardiac: RRR; no murmurs, rubs, or gallops,no edema  Respiratory:  clear to auscultation bilaterally, normal work of breathing GI: soft, nontender, nondistended, + BS MS: no deformity or atrophy Skin: warm and dry, no rash Neuro:  Strength and sensation are intact Psych: euthymic mood, full affect   EKG:  EKG is ordered today. The ekg ordered today demonstrates NSR with low voltage otherwise normal. I have personally reviewed and interpreted this study.    Recent Labs: 09/30/2020: TSH 3.327 08/03/2021: ALT 16 08/26/2021: BUN 9; Creatinine, Ser 0.45; Hemoglobin 12.3; Platelets 241; Potassium 4.1; Sodium 136    Lipid Panel    Component Value Date/Time   CHOL 188 09/30/2020 1811   TRIG 181 (H) 09/30/2020 1811   HDL 53 09/30/2020 1811   CHOLHDL 3.5 09/30/2020 1811   VLDL 36 09/30/2020 1811   LDLCALC 99 09/30/2020 1811   LDLCALC 115 (H) 11/01/2017 1051   Dated 06/21/21: normal CBC and CMET  Wt Readings from Last 3 Encounters:  09/01/21 230 lb (104.3 kg)  08/31/21 230 lb (104.3 kg)  08/26/21 227 lb 4.8 oz (103.1 kg)      Other studies Reviewed: Additional studies/ records that were reviewed today include: see HPI. Review of the above records demonstrates: N/A   ASSESSMENT AND PLAN:  1.  Atypical chest pain and dyspnea.  Could be related to GERD or musculoskeletal pain with her pregnancy. Will check Echo to assess cardiac function. If OK will reassure. 2. PVCs. Asymptomatic. Labs OK. If EF normal would not treat.  3. Gestational DM 4. Gestational HTN.    Current medicines are reviewed at length with the patient today.  The patient does not have concerns regarding medicines.  The following changes have been made:  no change  Labs/ tests ordered today include:   Orders Placed This Encounter  Procedures   EKG 12-Lead   ECHOCARDIOGRAM COMPLETE         Disposition:   FU TBD  Signed, Demtrius Rounds [place], MD  09/01/2021 3:51 PM    [facility] [address] [place], [place], [address] Phone [phone], Fax [phone]

## 2021-08-31 ENCOUNTER — Ambulatory Visit (INDEPENDENT_AMBULATORY_CARE_PROVIDER_SITE_OTHER): Payer: Medicaid Other | Admitting: Obstetrics & Gynecology

## 2021-08-31 ENCOUNTER — Encounter: Payer: Self-pay | Admitting: Obstetrics & Gynecology

## 2021-08-31 VITALS — BP 118/83 | HR 95 | Wt 230.0 lb

## 2021-08-31 DIAGNOSIS — Z1379 Encounter for other screening for genetic and chromosomal anomalies: Secondary | ICD-10-CM

## 2021-08-31 DIAGNOSIS — O0992 Supervision of high risk pregnancy, unspecified, second trimester: Secondary | ICD-10-CM

## 2021-08-31 LAB — POCT URINALYSIS DIPSTICK OB
Blood, UA: NEGATIVE
Glucose, UA: NEGATIVE
Ketones, UA: NEGATIVE
Leukocytes, UA: NEGATIVE
Nitrite, UA: NEGATIVE
POC,PROTEIN,UA: NEGATIVE

## 2021-08-31 NOTE — Progress Notes (Signed)
HIGH-RISK PREGNANCY VISIT Patient name: [name] MRN [id number]  Date of birth: Sep 05, 1980 Chief Complaint:   Routine Prenatal Visit and High Risk Gestation  History of Present Illness:   [name] is a 41 y.o. [id number] female at 24w1dwith an Estimated Date of Delivery: 02/14/22 being seen today for ongoing management of a high-risk pregnancy complicated by:  -cHTN- Labetalol 2030mbid Not taking aspirin -Class B DM- no medication currently Occasional elevated fasting, overall within appropriate range A neg Anxiety- not started on celexa Reports that she has a lot going on and just hasn't gotten around to taking it.  Today she reports seasonal allergies- reviewed medications to take during pregnancy  Contractions: Not present. Vag. Bleeding: None.  Movement: Present - feeling flutters.  Denies leaking of fluid.      08/03/2021   10:21 AM 01/19/2020   10:54 AM 12/31/2017    8:59 AM 11/01/2017   10:06 AM 09/19/2017    8:38 AM  Depression screen PHQ 2/9  Decreased Interest 0 0 0 0 0  Down, Depressed, Hopeless 1 0 0 0 1  PHQ - 2 Score 1 0 0 0 1  Altered sleeping 0 0 0 0 0  Tired, decreased energy 1 0 1 0 0  Change in appetite 0 0     Feeling bad or failure about yourself  0 1 0 1 0  Trouble concentrating 0 0 0 0 0  Moving slowly or fidgety/restless 0 0     Suicidal thoughts 0 0 0 0 0  PHQ-9 Score '2 1 1 1 1     ' Current Outpatient Medications  Medication Instructions   Accu-Chek FastClix Lancets MISC Use as directed to check blood sugar 4 times daily   aspirin EC 162 mg, Oral, Daily, Swallow whole.   azelastine (ASTELIN) 0.1 % nasal spray 1 spray, 2 times daily   Blood Glucose Monitoring Suppl (ACCU-CHEK GUIDE ME) w/Device KIT 1 each, Does not apply, 4 times daily   Blood Pressure Monitor MISC For regular home bp monitoring during pregnancy   citalopram (CELEXA) 40 mg, Daily   Doxylamine-Pyridoxine (DICLEGIS) 10-10 MG TBEC 2 tabs q hs, if sx persist add 1 tab q am  on day 3, if sx persist add 1 tab q afternoon on day 4   glucose blood (ACCU-CHEK GUIDE) test strip Use as instructed to check blood sugar 4 times daily   labetalol (NORMODYNE) 200 mg, Oral, 2 times daily   loratadine (CLARITIN) 10 mg, Oral, Daily at bedtime   Pediatric Multivitamins-Iron (FLINTSTONES COMPLETE PO) 2 tablets, Oral     Review of Systems:   Pertinent items are noted in HPI Denies abnormal vaginal discharge w/ itching/odor/irritation, headaches, visual changes, shortness of breath, chest pain, abdominal pain, severe nausea/vomiting, or problems with urination or bowel movements unless otherwise stated above. Pertinent History Reviewed:  Reviewed past medical,surgical, social, obstetrical and family history.  Reviewed problem list, medications and allergies. Physical Assessment:   Vitals:   08/31/21 0926  BP: 118/83  Pulse: 95  Weight: 230 lb (104.3 kg)  Body mass index is 42.07 kg/m.           Physical Examination:   General appearance: alert, well appearing, and in no distress  Mental status: normal mood, behavior, speech, dress, motor activity, and thought processes  Skin: warm & dry   Extremities: Edema: None    Cardiovascular: normal heart rate noted  Respiratory: normal respiratory effort, no distress  Abdomen: gravid, soft, non-tender  Pelvic: Cervical exam deferred         Fetal Status: Fetal Heart Rate (bpm): 160   Movement: Present    Fetal Surveillance Testing today: doppler   Chaperone: Marcelino Scot    Results for orders placed or performed in visit on 08/31/21 (from the past 24 hour(s))  POC Urinalysis Dipstick OB   Collection Time: 08/31/21  9:27 AM  Result Value Ref Range   Color, UA     Clarity, UA     Glucose, UA Negative Negative   Bilirubin, UA     Ketones, UA neg    Spec Grav, UA     Blood, UA neg    pH, UA     POC,PROTEIN,UA Negative Negative, Trace, Small (1+), Moderate (2+), Large (3+), 4+   Urobilinogen, UA     Nitrite, UA neg     Leukocytes, UA Negative Negative   Appearance     Odor       Assessment & Plan:  High-risk pregnancy: [id number] at 69w1dwith an Estimated Date of Delivery: 02/14/22   1) Class B DM -reviewed how to make low carb choices -continue log  2) cHTN -doing well with Labetalol 2061mbid -suspect aspirin not covered, Rx has already been sent in -discussed OTC option- written down for patient '[]'  confirm at next visit that pt is taking aspirin  3) Anxiety -encouraged pt to start Celexa daily  Meds: No orders of the defined types were placed in this encounter.   Labs/procedures today: IT-2  Treatment Plan:  anatomy scan next visit, as outlined above  Reviewed: Preterm labor symptoms and general obstetric precautions including but not limited to vaginal bleeding, contractions, leaking of fluid and fetal movement were reviewed in detail with the patient.  All questions were answered. Pt has home bp cuff. Check bp weekly, let usKoreanow if >140/90.   Follow-up: Return in about 4 weeks (around 09/28/2021) for HROB visit and anatomy scan.   Future Appointments  Date Time Provider De[place]5/26/2023  3:00 PM JoMartiniquePeter M, MD [facility]6/20/2023  3:15 PM [facility]6/28/2023  3:45 PM [place] [place] [facility] US[place]WH-[facility] None  10/04/2021  4:30 PM OzJanyth PupaDO [facility]TO[facility]    Orders Placed This Encounter  Procedures   INTEGRATED 2   POC Urinalysis Dipstick OB    JeJanyth PupaDO Attending [facility]Blue [facility]De[facility] CompanyCoFelsenthalroup

## 2021-09-01 ENCOUNTER — Ambulatory Visit: Payer: Medicaid Other | Admitting: Cardiology

## 2021-09-01 ENCOUNTER — Encounter: Payer: Self-pay | Admitting: Cardiology

## 2021-09-01 VITALS — BP 112/58 | HR 97 | Ht 63.0 in | Wt 230.0 lb

## 2021-09-01 DIAGNOSIS — R0789 Other chest pain: Secondary | ICD-10-CM | POA: Diagnosis not present

## 2021-09-01 DIAGNOSIS — R0602 Shortness of breath: Secondary | ICD-10-CM | POA: Diagnosis not present

## 2021-09-01 NOTE — Patient Instructions (Signed)
Medication Instructions:  Your physician recommends that you continue on your current medications as directed. Please refer to the Current Medication list given to you today.  *If you need a refill on your cardiac medications before your next appointment, please call your pharmacy*    Testing/Procedures: Your physician has requested that you have an echocardiogram. Echocardiography is a painless test that uses sound waves to create images of your heart. It provides your doctor with information about the size and shape of your heart and how well your heart's chambers and valves are working. This procedure takes approximately one hour. There are no restrictions for this procedure. This procedure will be done at [address] [place]   Follow-Up: At [facility], you and your health needs are our priority.  As part of our continuing mission to provide you with exceptional heart care, we have created designated Provider Care Teams.  These Care Teams include your primary Cardiologist (physician) and Advanced Practice Providers (APPs -  Physician Assistants and Nurse Practitioners) who all work together to provide you with the care you need, when you need it.  We recommend signing up for the patient portal called "MyChart".  Sign up information is provided on this After Visit Summary.  MyChart is used to connect with patients for Virtual Visits (Telemedicine).  Patients are able to view lab/test results, encounter notes, upcoming appointments, etc.  Non-urgent messages can be sent to your provider as well.   To learn more about what you can do with MyChart, go to [email or web address].    Your next appointment:   We will see you on an as needed basis.  Provider:   Peter [place], MD

## 2021-09-02 LAB — INTEGRATED 2
AFP MoM: 1.82
Alpha-Fetoprotein: 43.9 ng/mL
Crown Rump Length: 60.9 mm
DIA MoM: 1.15
DIA Value: 136.4 pg/mL
Estriol, Unconjugated: 0.61 ng/mL
Gest. Age on Collection Date: 12.4 weeks
Gestational Age: 16.4 weeks
Maternal Age at EDD: 42 yr
Nuchal Translucency (NT): 1.3 mm
Nuchal Translucency MoM: 0.96
Number of Fetuses: 1
PAPP-A MoM: 0.62
PAPP-A Value: 357.1 ng/mL
Test Results:: NEGATIVE
Weight: 233 [lb_av]
Weight: 233 [lb_av]
hCG MoM: 1.15
hCG Value: 28.3 IU/mL
uE3 MoM: 0.69

## 2021-09-03 ENCOUNTER — Encounter (HOSPITAL_COMMUNITY): Payer: Self-pay | Admitting: Family Medicine

## 2021-09-03 ENCOUNTER — Inpatient Hospital Stay (HOSPITAL_COMMUNITY): Payer: Medicaid Other

## 2021-09-03 ENCOUNTER — Other Ambulatory Visit: Payer: Self-pay

## 2021-09-03 ENCOUNTER — Encounter (HOSPITAL_COMMUNITY)
Admission: AD | Disposition: A | Discharge status: Home / Self Care | Payer: Self-pay | Source: Home / Self Care | Attending: Family Medicine

## 2021-09-03 ENCOUNTER — Inpatient Hospital Stay (EMERGENCY_DEPARTMENT_HOSPITAL): Payer: Medicaid Other | Admitting: Anesthesiology

## 2021-09-03 ENCOUNTER — Ambulatory Visit (HOSPITAL_COMMUNITY)
Admission: AD | Admit: 2021-09-03 | Discharge: 2021-09-03 | Disposition: A | Discharge status: Home / Self Care | Payer: Medicaid Other | Attending: Family Medicine | Admitting: Family Medicine

## 2021-09-03 ENCOUNTER — Inpatient Hospital Stay (HOSPITAL_COMMUNITY): Payer: Medicaid Other | Admitting: Anesthesiology

## 2021-09-03 DIAGNOSIS — O162 Unspecified maternal hypertension, second trimester: Secondary | ICD-10-CM | POA: Diagnosis not present

## 2021-09-03 DIAGNOSIS — K3580 Unspecified acute appendicitis: Secondary | ICD-10-CM | POA: Insufficient documentation

## 2021-09-03 DIAGNOSIS — O10912 Unspecified pre-existing hypertension complicating pregnancy, second trimester: Secondary | ICD-10-CM | POA: Insufficient documentation

## 2021-09-03 DIAGNOSIS — K760 Fatty (change of) liver, not elsewhere classified: Secondary | ICD-10-CM | POA: Diagnosis not present

## 2021-09-03 DIAGNOSIS — R188 Other ascites: Secondary | ICD-10-CM | POA: Diagnosis not present

## 2021-09-03 DIAGNOSIS — R1110 Vomiting, unspecified: Secondary | ICD-10-CM | POA: Diagnosis not present

## 2021-09-03 DIAGNOSIS — Z87891 Personal history of nicotine dependence: Secondary | ICD-10-CM | POA: Insufficient documentation

## 2021-09-03 DIAGNOSIS — Z9049 Acquired absence of other specified parts of digestive tract: Secondary | ICD-10-CM | POA: Diagnosis not present

## 2021-09-03 DIAGNOSIS — Z6841 Body Mass Index (BMI) 40.0 and over, adult: Secondary | ICD-10-CM | POA: Diagnosis not present

## 2021-09-03 DIAGNOSIS — O99612 Diseases of the digestive system complicating pregnancy, second trimester: Secondary | ICD-10-CM | POA: Diagnosis not present

## 2021-09-03 DIAGNOSIS — F32A Depression, unspecified: Secondary | ICD-10-CM | POA: Diagnosis not present

## 2021-09-03 DIAGNOSIS — I10 Essential (primary) hypertension: Secondary | ICD-10-CM

## 2021-09-03 DIAGNOSIS — F418 Other specified anxiety disorders: Secondary | ICD-10-CM

## 2021-09-03 DIAGNOSIS — O26899 Other specified pregnancy related conditions, unspecified trimester: Secondary | ICD-10-CM | POA: Diagnosis not present

## 2021-09-03 DIAGNOSIS — O24419 Gestational diabetes mellitus in pregnancy, unspecified control: Secondary | ICD-10-CM | POA: Insufficient documentation

## 2021-09-03 DIAGNOSIS — Z3A16 16 weeks gestation of pregnancy: Secondary | ICD-10-CM | POA: Insufficient documentation

## 2021-09-03 DIAGNOSIS — R1031 Right lower quadrant pain: Secondary | ICD-10-CM | POA: Diagnosis not present

## 2021-09-03 DIAGNOSIS — O99342 Other mental disorders complicating pregnancy, second trimester: Secondary | ICD-10-CM | POA: Diagnosis not present

## 2021-09-03 DIAGNOSIS — Z79899 Other long term (current) drug therapy: Secondary | ICD-10-CM | POA: Insufficient documentation

## 2021-09-03 DIAGNOSIS — F419 Anxiety disorder, unspecified: Secondary | ICD-10-CM | POA: Diagnosis not present

## 2021-09-03 DIAGNOSIS — K6389 Other specified diseases of intestine: Secondary | ICD-10-CM | POA: Diagnosis not present

## 2021-09-03 HISTORY — PX: APPENDECTOMY: SHX54

## 2021-09-03 HISTORY — PX: LAPAROSCOPIC APPENDECTOMY: SHX408

## 2021-09-03 LAB — BASIC METABOLIC PANEL
Anion gap: 11 (ref 5–15)
BUN: 8 mg/dL (ref 6–20)
CO2: 18 mmol/L — ABNORMAL LOW (ref 22–32)
Calcium: 9.6 mg/dL (ref 8.9–10.3)
Chloride: 107 mmol/L (ref 98–111)
Creatinine, Ser: 0.49 mg/dL (ref 0.44–1.00)
GFR, Estimated: >60 mL/min (ref >60)
Glucose, Bld: 113 mg/dL — ABNORMAL HIGH (ref 70–99)
Potassium: 3.5 mmol/L (ref 3.5–5.1)
Sodium: 136 mmol/L (ref 135–145)

## 2021-09-03 LAB — CBC WITH DIFFERENTIAL/PLATELET
Abs Immature Granulocytes: 0.06 10*3/uL (ref 0.00–0.07)
Basophils Absolute: 0 10*3/uL (ref 0.0–0.1)
Basophils Relative: 0 %
Eosinophils Absolute: 0 10*3/uL (ref 0.0–0.5)
Eosinophils Relative: 0 %
HCT: 36.4 % (ref 36.0–46.0)
Hemoglobin: 12 g/dL (ref 12.0–15.0)
Immature Granulocytes: 1 %
Lymphocytes Relative: 6 %
Lymphs Abs: 0.7 10*3/uL (ref 0.7–4.0)
MCH: 28.5 pg (ref 26.0–34.0)
MCHC: 33 g/dL (ref 30.0–36.0)
MCV: 86.5 fL (ref 80.0–100.0)
Monocytes Absolute: 0.4 10*3/uL (ref 0.1–1.0)
Monocytes Relative: 3 %
Neutro Abs: 11.2 10*3/uL — ABNORMAL HIGH (ref 1.7–7.7)
Neutrophils Relative %: 90 %
Platelets: 281 10*3/uL (ref 150–400)
RBC: 4.21 MIL/uL (ref 3.87–5.11)
RDW: 14.5 % (ref 11.5–15.5)
WBC: 12.3 10*3/uL — ABNORMAL HIGH (ref 4.0–10.5)
nRBC: 0 % (ref 0.0–0.2)

## 2021-09-03 LAB — URINALYSIS, ROUTINE W REFLEX MICROSCOPIC
Bilirubin Urine: NEGATIVE
Glucose, UA: NEGATIVE mg/dL
Hgb urine dipstick: NEGATIVE
Ketones, ur: 80 mg/dL — AB
Leukocytes,Ua: NEGATIVE
Nitrite: NEGATIVE
Protein, ur: 30 mg/dL — AB
Specific Gravity, Urine: 1.021 (ref 1.005–1.030)
pH: 7 (ref 5.0–8.0)

## 2021-09-03 LAB — RAPID URINE DRUG SCREEN, HOSP PERFORMED
Amphetamines: NOT DETECTED
Barbiturates: NOT DETECTED
Benzodiazepines: NOT DETECTED
Cocaine: NOT DETECTED
Opiates: NOT DETECTED
Tetrahydrocannabinol: NOT DETECTED

## 2021-09-03 LAB — GLUCOSE, CAPILLARY
Glucose-Capillary: 118 mg/dL — ABNORMAL HIGH (ref 70–99)
Glucose-Capillary: 86 mg/dL (ref 70–99)

## 2021-09-03 LAB — LIPASE, BLOOD: Lipase: 21 U/L (ref 11–51)

## 2021-09-03 SURGERY — APPENDECTOMY, LAPAROSCOPIC
Anesthesia: General

## 2021-09-03 MED ORDER — LACTATED RINGERS IV SOLN: INTRAVENOUS | Status: DC | PRN

## 2021-09-03 MED ORDER — GLYCOPYRROLATE 0.2 MG/ML IJ SOLN
INTRAMUSCULAR | Status: DC | PRN
  Administered 2021-09-03: .6 mg via INTRAVENOUS

## 2021-09-03 MED ORDER — FENTANYL CITRATE (PF) 100 MCG/2ML IJ SOLN
25.0000 ug | INTRAMUSCULAR | Status: DC | PRN
  Administered 2021-09-03 (×2): 50 ug via INTRAVENOUS

## 2021-09-03 MED ORDER — PROPOFOL 10 MG/ML IV BOLUS
INTRAVENOUS | Status: AC
  Filled 2021-09-03: qty 20

## 2021-09-03 MED ORDER — SODIUM CHLORIDE 0.9 % IV SOLN
2.0000 g | Freq: Once | INTRAVENOUS | Status: AC
  Administered 2021-09-03: 2 g via INTRAVENOUS
  Filled 2021-09-03: qty 20

## 2021-09-03 MED ORDER — NEOSTIGMINE METHYLSULFATE 3 MG/3ML IV SOSY
PREFILLED_SYRINGE | INTRAVENOUS | Status: AC
  Filled 2021-09-03: qty 3

## 2021-09-03 MED ORDER — METRONIDAZOLE 500 MG/100ML IV SOLN
500.0000 mg | Freq: Once | INTRAVENOUS | Status: AC
  Administered 2021-09-03: 500 mg via INTRAVENOUS
  Filled 2021-09-03: qty 100

## 2021-09-03 MED ORDER — ONDANSETRON HCL 4 MG/2ML IJ SOLN
INTRAMUSCULAR | Status: AC
  Filled 2021-09-03: qty 2

## 2021-09-03 MED ORDER — GLYCOPYRROLATE PF 0.2 MG/ML IJ SOSY
PREFILLED_SYRINGE | INTRAMUSCULAR | Status: AC
  Filled 2021-09-03: qty 3

## 2021-09-03 MED ORDER — OXYCODONE-ACETAMINOPHEN 5-325 MG PO TABS: 1.0000 | ORAL_TABLET | ORAL | 0 refills | Status: DC | PRN

## 2021-09-03 MED ORDER — NEOSTIGMINE METHYLSULFATE 10 MG/10ML IV SOLN
INTRAVENOUS | Status: DC | PRN
Start: 2021-09-03 — End: 2021-09-03
  Administered 2021-09-03: 1 mg via INTRAVENOUS
  Administered 2021-09-03: 4 mg via INTRAVENOUS

## 2021-09-03 MED ORDER — FENTANYL CITRATE (PF) 250 MCG/5ML IJ SOLN
INTRAMUSCULAR | Status: DC | PRN
  Administered 2021-09-03 (×2): 100 ug via INTRAVENOUS
  Administered 2021-09-03: 50 ug via INTRAVENOUS

## 2021-09-03 MED ORDER — LACTATED RINGERS IV SOLN
Freq: Once | INTRAVENOUS | Status: AC
Start: 2021-09-03 — End: 2021-09-03

## 2021-09-03 MED ORDER — HYDROXYZINE HCL 50 MG/ML IM SOLN
25.0000 mg | Freq: Once | INTRAMUSCULAR | Status: AC
  Administered 2021-09-03: 25 mg via INTRAMUSCULAR
  Filled 2021-09-03: qty 0.5

## 2021-09-03 MED ORDER — ROCURONIUM BROMIDE 10 MG/ML (PF) SYRINGE
PREFILLED_SYRINGE | INTRAVENOUS | Status: AC
  Filled 2021-09-03: qty 10

## 2021-09-03 MED ORDER — SUCCINYLCHOLINE CHLORIDE 200 MG/10ML IV SOSY
PREFILLED_SYRINGE | INTRAVENOUS | Status: AC
  Filled 2021-09-03: qty 10

## 2021-09-03 MED ORDER — FAMOTIDINE IN NACL 20-0.9 MG/50ML-% IV SOLN
20.0000 mg | Freq: Once | INTRAVENOUS | Status: AC
  Administered 2021-09-03: 20 mg via INTRAVENOUS
  Filled 2021-09-03: qty 50

## 2021-09-03 MED ORDER — BUPIVACAINE HCL 0.25 % IJ SOLN
INTRAMUSCULAR | Status: DC | PRN
  Administered 2021-09-03: 4 mL

## 2021-09-03 MED ORDER — ACETAMINOPHEN 10 MG/ML IV SOLN
INTRAVENOUS | Status: AC
  Filled 2021-09-03: qty 100

## 2021-09-03 MED ORDER — HYDROMORPHONE HCL 1 MG/ML IJ SOLN
0.5000 mg | INTRAMUSCULAR | Status: DC | PRN
  Administered 2021-09-03: 0.5 mg via INTRAVENOUS
  Filled 2021-09-03: qty 1

## 2021-09-03 MED ORDER — ONDANSETRON HCL 4 MG/2ML IJ SOLN
INTRAMUSCULAR | Status: DC | PRN
Start: 2021-09-03 — End: 2021-09-03
  Administered 2021-09-03: 4 mg via INTRAVENOUS

## 2021-09-03 MED ORDER — LACTATED RINGERS IV SOLN: Freq: Once | INTRAVENOUS | Status: DC

## 2021-09-03 MED ORDER — ONDANSETRON HCL 4 MG/2ML IJ SOLN: 4.0000 mg | Freq: Once | INTRAMUSCULAR | Status: DC | PRN

## 2021-09-03 MED ORDER — LIDOCAINE 2% (20 MG/ML) 5 ML SYRINGE
INTRAMUSCULAR | Status: AC
  Filled 2021-09-03: qty 5

## 2021-09-03 MED ORDER — ONDANSETRON HCL 4 MG/2ML IJ SOLN
4.0000 mg | Freq: Once | INTRAMUSCULAR | Status: AC
  Administered 2021-09-03: 4 mg via INTRAVENOUS
  Filled 2021-09-03: qty 2

## 2021-09-03 MED ORDER — PHENYLEPHRINE 80 MCG/ML (10ML) SYRINGE FOR IV PUSH (FOR BLOOD PRESSURE SUPPORT)
PREFILLED_SYRINGE | INTRAVENOUS | Status: AC
  Filled 2021-09-03: qty 10

## 2021-09-03 MED ORDER — M.V.I. ADULT IV INJ
Freq: Once | INTRAVENOUS | Status: AC
  Filled 2021-09-03: qty 10

## 2021-09-03 MED ORDER — LIDOCAINE 2% (20 MG/ML) 5 ML SYRINGE
INTRAMUSCULAR | Status: DC | PRN
  Administered 2021-09-03: 80 mg via INTRAVENOUS

## 2021-09-03 MED ORDER — FENTANYL CITRATE (PF) 250 MCG/5ML IJ SOLN
INTRAMUSCULAR | Status: AC
  Filled 2021-09-03: qty 5

## 2021-09-03 MED ORDER — SUCCINYLCHOLINE CHLORIDE 200 MG/10ML IV SOSY
PREFILLED_SYRINGE | INTRAVENOUS | Status: DC | PRN
  Administered 2021-09-03: 100 mg via INTRAVENOUS

## 2021-09-03 MED ORDER — ACETAMINOPHEN 10 MG/ML IV SOLN
INTRAVENOUS | Status: DC | PRN
  Administered 2021-09-03: 1000 mg via INTRAVENOUS

## 2021-09-03 MED ORDER — SODIUM CHLORIDE 0.9 % IV SOLN
Freq: Once | INTRAVENOUS | Status: AC
Start: 2021-09-03 — End: 2021-09-03

## 2021-09-03 MED ORDER — PROPOFOL 10 MG/ML IV BOLUS
INTRAVENOUS | Status: DC | PRN
  Administered 2021-09-03: 160 mg via INTRAVENOUS

## 2021-09-03 MED ORDER — FENTANYL CITRATE (PF) 100 MCG/2ML IJ SOLN
INTRAMUSCULAR | Status: AC
  Filled 2021-09-03: qty 2

## 2021-09-03 MED ORDER — LACTATED RINGERS IV BOLUS
1000.0000 mL | Freq: Once | INTRAVENOUS | Status: AC
  Administered 2021-09-03: 1000 mL via INTRAVENOUS

## 2021-09-03 MED ORDER — ROCURONIUM BROMIDE 10 MG/ML (PF) SYRINGE
PREFILLED_SYRINGE | INTRAVENOUS | Status: DC | PRN
  Administered 2021-09-03: 50 mg via INTRAVENOUS

## 2021-09-03 MED ORDER — BUPIVACAINE HCL (PF) 0.25 % IJ SOLN
INTRAMUSCULAR | Status: AC
  Filled 2021-09-03: qty 30

## 2021-09-03 MED ORDER — HYDROMORPHONE HCL 1 MG/ML IJ SOLN
1.0000 mg | INTRAMUSCULAR | Status: DC | PRN
  Administered 2021-09-03: 1 mg via INTRAVENOUS
  Filled 2021-09-03: qty 1

## 2021-09-03 MED ORDER — PHENYLEPHRINE HCL (PRESSORS) 10 MG/ML IV SOLN
INTRAVENOUS | Status: DC | PRN
  Administered 2021-09-03: 40 ug via INTRAVENOUS

## 2021-09-03 MED ORDER — HYDROMORPHONE HCL 1 MG/ML IJ SOLN
0.5000 mg | Freq: Once | INTRAMUSCULAR | Status: AC
  Administered 2021-09-03: 0.5 mg via INTRAVENOUS
  Filled 2021-09-03: qty 1

## 2021-09-03 SURGICAL SUPPLY — 40 items
ADH SKN CLS APL DERMABOND .7 (GAUZE/BANDAGES/DRESSINGS) ×1
APL PRP STRL LF DISP 70% ISPRP (MISCELLANEOUS) ×1
BAG COUNTER SPONGE SURGICOUNT (BAG) ×2 IMPLANT
BAG SPNG CNTER NS LX DISP (BAG) ×1
CANISTER SUCT 3000ML PPV (MISCELLANEOUS) ×2 IMPLANT
CHLORAPREP W/TINT 26 (MISCELLANEOUS) ×2 IMPLANT
CLIP LIGATING HEMO LOK XL GOLD (MISCELLANEOUS) ×2 IMPLANT
COVER SURGICAL LIGHT HANDLE (MISCELLANEOUS) ×2 IMPLANT
COVER TRANSDUCER ULTRASND (DRAPES) ×2 IMPLANT
DERMABOND ADVANCED (GAUZE/BANDAGES/DRESSINGS) ×1
DERMABOND ADVANCED .7 DNX12 (GAUZE/BANDAGES/DRESSINGS) ×1 IMPLANT
ELECT REM PT RETURN 9FT ADLT (ELECTROSURGICAL) ×2
ELECTRODE REM PT RTRN 9FT ADLT (ELECTROSURGICAL) ×1 IMPLANT
GLOVE BIO SURGEON STRL SZ7.5 (GLOVE) ×2 IMPLANT
GLOVE SURG SYN 7.5  E (GLOVE) ×2
GLOVE SURG SYN 7.5 E (GLOVE) ×1 IMPLANT
GLOVE SURG SYN 7.5 PF PI (GLOVE) ×1 IMPLANT
GOWN STRL REUS W/ TWL LRG LVL3 (GOWN DISPOSABLE) ×2 IMPLANT
GOWN STRL REUS W/ TWL XL LVL3 (GOWN DISPOSABLE) ×1 IMPLANT
GOWN STRL REUS W/TWL LRG LVL3 (GOWN DISPOSABLE) ×4
GOWN STRL REUS W/TWL XL LVL3 (GOWN DISPOSABLE) ×2
GRASPER SUT TROCAR 14GX15 (MISCELLANEOUS) ×2 IMPLANT
KIT BASIN OR (CUSTOM PROCEDURE TRAY) ×2 IMPLANT
KIT TURNOVER KIT B (KITS) ×2 IMPLANT
NDL INSUFFLATION 14GA 120MM (NEEDLE) ×1 IMPLANT
NEEDLE INSUFFLATION 14GA 120MM (NEEDLE) ×2 IMPLANT
NS IRRIG 1000ML POUR BTL (IV SOLUTION) ×2 IMPLANT
PAD ARMBOARD 7.5X6 YLW CONV (MISCELLANEOUS) ×4 IMPLANT
SCISSORS LAP 5X35 DISP (ENDOMECHANICALS) ×2 IMPLANT
SET IRRIG TUBING LAPAROSCOPIC (IRRIGATION / IRRIGATOR) ×2 IMPLANT
SET TUBE SMOKE EVAC HIGH FLOW (TUBING) ×2 IMPLANT
SLEEVE ENDOPATH XCEL 5M (ENDOMECHANICALS) ×2 IMPLANT
SPECIMEN JAR SMALL (MISCELLANEOUS) ×2 IMPLANT
SUT MNCRL AB 4-0 PS2 18 (SUTURE) ×2 IMPLANT
TOWEL GREEN STERILE (TOWEL DISPOSABLE) ×2 IMPLANT
TOWEL GREEN STERILE FF (TOWEL DISPOSABLE) ×2 IMPLANT
TRAY LAPAROSCOPIC MC (CUSTOM PROCEDURE TRAY) ×2 IMPLANT
TROCAR XCEL NON-BLD 11X100MML (ENDOMECHANICALS) ×2 IMPLANT
TROCAR XCEL NON-BLD 5MMX100MML (ENDOMECHANICALS) ×2 IMPLANT
WARMER LAPAROSCOPE (MISCELLANEOUS) ×2 IMPLANT

## 2021-09-03 NOTE — MAU Provider Note (Signed)
History     CSN: [id number]  Arrival date and time: 09/03/21 1017   Event Date/Time   First Provider Initiated Contact with Patient 09/03/21 1105      Chief Complaint  Patient presents with   Abdominal Pain   Nausea   HPI [name] is a 41 y.o. [id number] at 23w4dwho presents to MAU with chief complaint of abdominal pain. This is a new problem, onset at 0400 this morning. She is not able to identify one locus of pain but reports feeling it at her umbilicus, across the top of her abdomen and right mid-abdomen. Pain score is 9/10. She has not taken medication for this complaint. Patient denies constipation, vaginal bleeding, fever, dysuria, recent illness.    Patient also c/o recurrent vomiting, onset sometime after the onset of her abdominal pain. She has vomited approximately six times since onset. She does not have access to antiemetics. Dinner last night was tuna on toast. She has not eaten today.  [facility] BID, A1GDM, AMA  OB History     Gravida  9   Para  6   Term  6   Preterm  0   AB  2   Living  6      SAB  2   IAB      Ectopic      Multiple  0   Live Births  6        Obstetric Comments  Dr. FNestor LewandowskyTree.          Past Medical History:  Diagnosis Date   Anemia    Anxiety "fast hearbeat"   took Benadryl at end of pregnancy   Back pain affecting pregnancy in first trimester 082/50/5397  Complication of anesthesia    Epidural 1 sided   Depression    During 1 pregnancy   Diabetes mellitus without complication ([facility] [place]    Endometriosis    Gallstones    Gestational diabetes    just started checking BS 03/05/12   Headache(784.0)    Hematuria 05/27/2015   History of kidney stones    Irregular periods    Pregnancy induced hypertension    Recurrent upper respiratory infection (URI)    Mostly when pregnant   Spotting affecting pregnancy in first  trimester 01/27/2015    Past Surgical History:  Procedure Laterality Date   CHOLECYSTECTOMY N/A 03/20/2019   Procedure: LAPAROSCOPIC CHOLECYSTECTOMY;  Surgeon: JAviva Signs MD;  Location: [facility];  Service: General;  Laterality: N/A;   MULTIPLE TOOTH EXTRACTIONS     WISDOM TOOTH EXTRACTION      Family History  Problem Relation Age of Onset   Hypertension Mother    Anxiety disorder Mother    Depression Mother    Hypertension Father    Anxiety disorder Sister    ADD / ADHD Daughter    Asthma Daughter    ADD / ADHD Daughter    Seizures Daughter    ADD / ADHD Daughter    ADD / ADHD Daughter    Heart disease Maternal Grandmother    Cancer Maternal Grandmother        brain   Heart failure Maternal Grandmother    Breast cancer Maternal Grandmother    Cancer Paternal Grandmother        brain    Social History   Tobacco Use   Smoking status: Former    Packs/day: 0.10  Years: 0.50    Pack years: 0.05    Types: Cigarettes    Start date: 02/19/1998    Quit date: 09/17/1998    Years since quitting: 22.9   Smokeless tobacco: Never  Vaping Use   Vaping Use: Never used  Substance Use Topics   Alcohol use: Not Currently    Comment: not while pregnant   Drug use: No    Allergies:  Allergies  Allergen Reactions   Sulfamethoxazole-Trimethoprim Swelling and Rash    Medications Prior to Admission  Medication Sig Dispense Refill Last Dose   Accu-Chek FastClix Lancets MISC Use as directed to check blood sugar 4 times daily 100 each 12    aspirin 81 MG EC tablet Take 2 tablets (162 mg total) by mouth daily. Swallow whole. 180 tablet 2    azelastine (ASTELIN) 0.1 % nasal spray Place 1 spray into both nostrils 2 (two) times daily. Use in each nostril as directed      Blood Glucose Monitoring Suppl (ACCU-CHEK GUIDE ME) w/Device KIT 1 each by Does not apply route 4 (four) times daily. 1 kit 0    Blood Pressure Monitor MISC For regular home bp monitoring during pregnancy 1 each 0     citalopram (CELEXA) 40 MG tablet Take 40 mg by mouth daily.      Doxylamine-Pyridoxine (DICLEGIS) 10-10 MG TBEC 2 tabs q hs, if sx persist add 1 tab q am on day 3, if sx persist add 1 tab q afternoon on day 4 100 tablet 6    glucose blood (ACCU-CHEK GUIDE) test strip Use as instructed to check blood sugar 4 times daily 50 each 12    labetalol (NORMODYNE) 200 MG tablet Take 1 tablet (200 mg total) by mouth 2 (two) times daily. 60 tablet 3    loratadine (CLARITIN) 10 MG tablet Take 10 mg by mouth at bedtime.      Pediatric Multivitamins-Iron (FLINTSTONES COMPLETE PO) Take 2 tablets by mouth.       Review of Systems  Constitutional:  Negative for fever.  Gastrointestinal:  Positive for abdominal pain, nausea and vomiting.  All other systems reviewed and are negative. Physical Exam   Blood pressure (!) 147/89, pulse 94, temperature 98 F (36.7 C), temperature source Oral, resp. rate 20, height '5\' 3"'  (1.6 m), weight 102.9 kg, last menstrual period 05/03/2021, SpO2 97 %.  Physical Exam Vitals and nursing note reviewed. Exam conducted with a chaperone present.  Constitutional:      General: She is in acute distress.  Cardiovascular:     Rate and Rhythm: Normal rate and regular rhythm.     Heart sounds: Normal heart sounds.  Pulmonary:     Effort: Pulmonary effort is normal.     Breath sounds: Normal breath sounds.  Abdominal:     General: Bowel sounds are normal.     Palpations: Abdomen is soft.     Tenderness: There is abdominal tenderness. There is guarding and rebound. There is no right CVA tenderness or left CVA tenderness. Negative signs include Murphy's sign.     Comments: Gravid  Skin:    General: Skin is warm.     Capillary Refill: Capillary refill takes less than 2 seconds.  Neurological:     Mental Status: She is alert and oriented to person, place, and time.    MAU Course  Procedures  MDM  9563: CNM returned to bedside with RN. Pain score now 3/10, patient  vocalizing almost continuously at that score. Identifes  locus of residual pain as midline of torso. CNM reviewed labs, interventions, plan to discuss indication for imaging with Dr. Kennon Rounds. Per discussion with Dr Kennon Rounds, MRI wo contrast ordered  1650: CNM returned to bedside. Patient resting quietly when CNM entered room, stated she was not sleeping. CNM explained patient will need another MRI to obtain sufficient views of her pelvis and therefore assess appendix. Apology offered. Patient states she is hungry, otherwise without complaints.  Orders Placed This Encounter  Procedures   Urinalysis, Routine w reflex microscopic Urine, Clean Catch   CBC with Differential/Platelet   Lipase, blood   Basic metabolic panel   Glucose, capillary   Rapid urine drug screen (hospital performed)   Insert peripheral IV   Patient Vitals for the past 24 hrs:  BP Temp Temp src Pulse Resp SpO2 Height Weight  09/03/21 1516 136/77 98.9 F (37.2 C) Oral 99 16 -- -- --  09/03/21 1048 (!) 147/89 98 F (36.7 C) Oral 94 20 97 % '5\' 3"'  (1.6 m) 102.9 kg    MR PELVIS WO CONTRAST  Result Date: 09/03/2021 CLINICAL DATA:  Pregnancy with right lower quadrant pain EXAM: MRI PELVIS WITHOUT CONTRAST TECHNIQUE: Multiplanar multisequence MR imaging of the pelvis was performed. No intravenous contrast was administered. COMPARISON:  None Available. FINDINGS: Urinary Tract:  Bladder is unremarkable. Bowel: The appendix is identified measuring 1 cm in maximum diameter (normal 6 mm or less), image 15/5. Mild appendiceal wall thickening is noted. Right lower quadrant periappendiceal fluid is identified. No abscess identified. Vascular/Lymphatic: No pathologically enlarged lymph nodes. No significant vascular abnormality seen. Reproductive:  Gravid uterus.  No adnexal mass. Other: Right lower quadrant free fluid identified extending into the right Eck Musculoskeletal: Region around the right ovary. IMPRESSION: 1. Imaging findings compatible  with acute appendicitis. The appendix is thickened measuring 1 cm in maximum diameter. There is mild appendiceal wall thickening with right lower quadrant free fluid. No focal fluid collection to suggest appendiceal abscess. 2. Gravid uterus. Electronically Signed   By: Kerby Moors M.D.   On: 09/03/2021 18:14   MR ABDOMEN WO CONTRAST  Result Date: 09/03/2021 CLINICAL DATA:  Sixteen weeks pregnant. Abdominal pain which began at 4 a.m. this morning. Vomiting. Right lower quadrant pain. EXAM: MRI ABDOMEN WITHOUT CONTRAST TECHNIQUE: Multiplanar multisequence MR imaging was performed without the administration of intravenous contrast. COMPARISON:  None Available. FINDINGS: Lower chest: No acute findings. Hepatobiliary: Status post cholecystectomy. No focal liver abnormality identified. Mild hepatic steatosis. There is no bile duct dilatation. Pancreas: No mass, inflammatory changes, or other parenchymal abnormality identified. Spleen:  Within normal limits in size and appearance. Adrenals/Urinary Tract: No masses identified. No evidence of hydronephrosis. Stomach/Bowel: Visualized portions within the abdomen are unremarkable. Vascular/Lymphatic: No pathologically enlarged lymph nodes identified. No abdominal aortic aneurysm demonstrated. Other:  No significant free fluid or signs of fluid collection. Musculoskeletal: No suspicious bone lesions identified. IMPRESSION: 1. No acute findings identified within the abdomen. Note: This exam only includes images of the abdomen. Exam does not extend through the pelvis. The pelvic bowel loops including the appendix are not included on this exam. 2. Status post cholecystectomy. 3. Mild hepatic steatosis. Electronically Signed   By: Kerby Moors M.D.   On: 09/03/2021 16:30    MR PELVIS WO CONTRAST  Result Date: 09/03/2021 CLINICAL DATA:  Pregnancy with right lower quadrant pain EXAM: MRI PELVIS WITHOUT CONTRAST TECHNIQUE: Multiplanar multisequence MR imaging of the  pelvis was performed. No intravenous contrast was administered. COMPARISON:  None  Available. FINDINGS: Urinary Tract:  Bladder is unremarkable. Bowel: The appendix is identified measuring 1 cm in maximum diameter (normal 6 mm or less), image 15/5. Mild appendiceal wall thickening is noted. Right lower quadrant periappendiceal fluid is identified. No abscess identified. Vascular/Lymphatic: No pathologically enlarged lymph nodes. No significant vascular abnormality seen. Reproductive:  Gravid uterus.  No adnexal mass. Other: Right lower quadrant free fluid identified extending into the right Eck Musculoskeletal: Region around the right ovary. IMPRESSION: 1. Imaging findings compatible with acute appendicitis. The appendix is thickened measuring 1 cm in maximum diameter. There is mild appendiceal wall thickening with right lower quadrant free fluid. No focal fluid collection to suggest appendiceal abscess. 2. Gravid uterus. Electronically Signed   By: Kerby Moors M.D.   On: 09/03/2021 18:14   MR ABDOMEN WO CONTRAST  Result Date: 09/03/2021 CLINICAL DATA:  Sixteen weeks pregnant. Abdominal pain which began at 4 a.m. this morning. Vomiting. Right lower quadrant pain. EXAM: MRI ABDOMEN WITHOUT CONTRAST TECHNIQUE: Multiplanar multisequence MR imaging was performed without the administration of intravenous contrast. COMPARISON:  None Available. FINDINGS: Lower chest: No acute findings. Hepatobiliary: Status post cholecystectomy. No focal liver abnormality identified. Mild hepatic steatosis. There is no bile duct dilatation. Pancreas: No mass, inflammatory changes, or other parenchymal abnormality identified. Spleen:  Within normal limits in size and appearance. Adrenals/Urinary Tract: No masses identified. No evidence of hydronephrosis. Stomach/Bowel: Visualized portions within the abdomen are unremarkable. Vascular/Lymphatic: No pathologically enlarged lymph nodes identified. No abdominal aortic aneurysm  demonstrated. Other:  No significant free fluid or signs of fluid collection. Musculoskeletal: No suspicious bone lesions identified. IMPRESSION: 1. No acute findings identified within the abdomen. Note: This exam only includes images of the abdomen. Exam does not extend through the pelvis. The pelvic bowel loops including the appendix are not included on this exam. 2. Status post cholecystectomy. 3. Mild hepatic steatosis. Electronically Signed   By: Kerby Moors M.D.   On: 09/03/2021 16:30     Assessment and Plan  --41 y.o. [id number] at [redacted]w[redacted]d --Acute Appendicitis per MRI Pelvis --Dr. PKennon Roundsin unit --Gen Surgery notified and inbound to unit --VSS, pain well-controlled with ordered medications --Prep for OR  [facility]ina [facility] MSN, CNM 09/03/2021, 8:39 PM

## 2021-09-03 NOTE — Progress Notes (Signed)
Nursing Note OB Rapid Response RN  [facility] OB Rapid Response RN came to Pacu to assess fetal heart tones. HR was 147.

## 2021-09-03 NOTE — Transfer of Care (Signed)
Immediate Anesthesia Transfer of Care Note  Patient: [name]  Procedure(s) Performed: APPENDECTOMY LAPAROSCOPIC  Patient Location: PACU  Anesthesia Type:General  Level of Consciousness: drowsy  Airway & Oxygen Therapy: Patient Spontanous Breathing and Patient connected to face mask oxygen  Post-op Assessment: Report given to RN and Post -op Vital signs reviewed and stable  Post vital signs: Reviewed and stable  Last Vitals:  Vitals Value Taken Time  BP 138/83 09/03/21 2116  Temp    Pulse 108 09/03/21 2117  Resp 16 09/03/21 2117  SpO2 100 % 09/03/21 2117  Vitals shown include unvalidated device data.  Last Pain:  Vitals:   09/03/21 1628  TempSrc:   PainSc: 8          Complications: No notable events documented.

## 2021-09-03 NOTE — MAU Note (Signed)
Pt to MRI via stretcher.

## 2021-09-03 NOTE — Anesthesia Preprocedure Evaluation (Addendum)
Anesthesia Evaluation  Patient identified by MRN, date of birth, ID band Patient awake    Reviewed: Allergy & Precautions, NPO status , Patient's Chart, lab work & pertinent test results, reviewed documented beta blocker date and time   Airway Mallampati: II  TM Distance: >3 FB Neck ROM: Full    Dental  (+) Dental Advisory Given, Edentulous Upper, Edentulous Lower   Pulmonary neg pulmonary ROS, former smoker,    Pulmonary exam normal breath sounds clear to auscultation       Cardiovascular hypertension, Pt. on home beta blockers Normal cardiovascular exam Rhythm:Regular Rate:Normal     Neuro/Psych  Headaches, PSYCHIATRIC DISORDERS Anxiety Depression    GI/Hepatic Neg liver ROS, APPENDICITIS   Endo/Other  diabetes, GestationalMorbid obesity  Renal/GU negative Renal ROS     Musculoskeletal negative musculoskeletal ROS (+)   Abdominal   Peds  Hematology negative hematology ROS (+)   Anesthesia Other Findings Day of surgery medications reviewed with the patient.  Reproductive/Obstetrics (+) Pregnancy [id number] at [redacted]w[redacted]d                             Anesthesia Physical Anesthesia Plan  ASA: 3  Anesthesia Plan: General   Post-op Pain Management:    Induction: Intravenous  PONV Risk Score and Plan: 3 and Dexamethasone and Ondansetron  Airway Management Planned: Oral ETT  Additional Equipment:   Intra-op Plan:   Post-operative Plan: Extubation in OR  Informed Consent: I have reviewed the patients History and Physical, chart, labs and discussed the procedure including the risks, benefits and alternatives for the proposed anesthesia with the patient or authorized representative who has indicated his/her understanding and acceptance.     Dental advisory given  Plan Discussed with: CRNA  Anesthesia Plan Comments: (FHT 160 bpm in MAU pre-op)        Anesthesia Quick Evaluation

## 2021-09-03 NOTE — Anesthesia Procedure Notes (Signed)
Procedure Name: Intubation Date/Time: 09/03/2021 8:34 PM Performed by: Clovis Cao, CRNA Pre-anesthesia Checklist: Patient identified, Emergency Drugs available, Suction available and Patient being monitored Patient Re-evaluated:Patient Re-evaluated prior to induction Oxygen Delivery Method: Circle system utilized Preoxygenation: Pre-oxygenation with 100% oxygen Induction Type: IV induction, Rapid sequence and Cricoid Pressure applied Laryngoscope Size: Miller and 2 Grade View: Grade II Tube type: Oral Tube size: 7.0 mm Number of attempts: 1 Airway Equipment and Method: Stylet and Oral airway Placement Confirmation: ETT inserted through vocal cords under direct vision, positive ETCO2 and breath sounds checked- equal and bilateral Secured at: 22 cm Tube secured with: Tape Dental Injury: Teeth and Oropharynx as per pre-operative assessment

## 2021-09-03 NOTE — Op Note (Signed)
09/03/2021  8:52 PM  PATIENT:  [name]  41 y.o. female  PRE-OPERATIVE DIAGNOSIS:  APPENDICITIS  POST-OPERATIVE DIAGNOSIS:  ACUTE APPENDICITIS  PROCEDURE:  Procedure(s): APPENDECTOMY LAPAROSCOPIC (N/A)  SURGEON:  Surgeon(s) and Role:    Axel Filler, MD - Primary  ANESTHESIA:   local and general  EBL:  minimal   BLOOD ADMINISTERED:none  DRAINS: none   LOCAL MEDICATIONS USED:  BUPIVICAINE   SPECIMEN:  Source of Specimen:  APPENDIX  DISPOSITION OF SPECIMEN:  PATHOLOGY  COUNTS:  YES  TOURNIQUET:  * No tourniquets in log *  DICTATION: .Dragon Dictation Complications: none  Counts: reported as correct x 2  Findings:  The patient had a acutely inflamed, nonperforated appendicitis  Specimen: Appendix  Indications for procedure:  The patient is a 41 year old-year-old female [redacted] weeks pregnant with a history of epigastric pain localized in the right lower quadrant patient had a MRI scan which revealed signs consistent with acute appendicitis the patient back in for laparoscopic appendectomy.  Details of the procedure:The patient was taken back to the operating room. The patient was placed in supine position with bilateral SCDs in place. The patient was prepped and draped in the usual sterile fashion.  After appropriate anitbiotics were confirmed, a time-out was confirmed and all facts were verified.    A pneumoperitoneum of 14 mmHg was obtained via a Veress needle technique in the left upper quadrant quadrant.  A 5 mm trocar and 5 mm camera then placed intra-abdominally there is no injury to any intra-abdominal organs a 10 mm infraumbilical port was placed and direct visualization as was a 5 mm port in the right upper quadrant area.   The appendix was identified and seen to be none perforated, there was some murky fluid in the right lower quadrant.  The uterus was gravid.  The appendix was cleaned down to the appendiceal base. The mesoappendix was then incised and  the appendiceal artery was cauterized.  The the appendiceal base was clean.  A gold hemoclip was placed proximallyx2 and one distally and the appendix was transected between these 2. A retrieval bag was then placed into the abdomen and the specimen placed in the bag. The appendiceal stump was cauterized. We evacuate the fluid from the pelvis until the effluent was clear.  The appendix and retrieval  bag was then retrieved via the supraumbilical port. #1 Vicryl was used to reapproximate the fascia at the umbilical port site x2. The skin was reapproximated all port sites 3-0 Monocryl subcuticular fashion. The skin was dressed with Dermabond.  The patient was awakened from general anesthesia was taken to recovery room in stable condition.      PLAN OF CARE: Discharge to home after PACU  PATIENT DISPOSITION:  PACU - hemodynamically stable.   Delay start of Pharmacological VTE agent (>24hrs) due to surgical blood loss or risk of bleeding: not applicable

## 2021-09-03 NOTE — Anesthesia Postprocedure Evaluation (Signed)
Anesthesia Post Note  Patient: [name]  Procedure(s) Performed: APPENDECTOMY LAPAROSCOPIC     Patient location during evaluation: PACU Anesthesia Type: General Level of consciousness: awake and alert Pain management: pain level controlled Vital Signs Assessment: post-procedure vital signs reviewed and stable Respiratory status: spontaneous breathing, nonlabored ventilation, respiratory function stable and patient connected to nasal cannula oxygen Cardiovascular status: blood pressure returned to baseline and stable Postop Assessment: no apparent nausea or vomiting Anesthetic complications: no Comments: FHR 147 bpm by OB Nurse   No notable events documented.  Last Vitals:  Vitals:   09/03/21 2122 09/03/21 2130  BP:    Pulse: 97 96  Resp: 13 (!) 8  Temp:    SpO2: 100% 100%    Last Pain:  Vitals:   09/03/21 2117  TempSrc:   PainSc: 8                  Collene Schlichter

## 2021-09-03 NOTE — MAU Note (Signed)
[name] is a 41 y.o. at [redacted]w[redacted]d here in MAU reporting: started having abdominal pain around 0400 this morning. Started vomiting at 0600 and states she had about 6 episodes. No bleeding.  Onset of complaint: today  Pain score: 9/10  Vitals:   09/03/21 1048  BP: (!) 147/89  Pulse: 94  Resp: 20  Temp: 98 F (36.7 C)  SpO2: 97%     FHT:160  Lab orders placed from triage: UA

## 2021-09-03 NOTE — Progress Notes (Signed)
Pacu Discharge Note  Patient instuctions were given to family. Wound care, diet, pain, follow up care and how and whom to contact with concerns were discussed. Family aware someone needs to remain with patient overnight and concerns after receiving anesthesia and what to avoid and safety. Answered all questions and concerns.   Discharge paperwork has clear contact informations for surgeon and 24 hour RN line for concerns.   Discussed what concerns to look for including infection and signs/symptoms to look for.   IV was removed prior to discharge. Patient was brought to car with belongings.   OB rapid response RN came and assessed fetal tones per Dr Josefine Class request prior to discharge home. Fetus' HR 147.   Pt exits my care.

## 2021-09-03 NOTE — H&P (Signed)
[name] is an 41 y.o. female.   Chief Complaint: Abdominal pain HPI: Patient is a 41 year old female who comes in at 16 weeks 4 days to the MAU with chief complaint of abdominal pain.  Patient states that pain began approximately 4 AM.  She states that this was followed by nausea vomiting approximately 6 AM.  States at that time pain was initially in the upper portion of her abdomen.  States that thereafter began to migrate to the right side.  Patient went MRI of her MAU and was found to have acute appendicitis.  General surgery was consulted for further evaluation and management.  Patient does have a history of hypertension and diabetes.  Patient's had a previous lap chole in the past.  Past Medical History:  Diagnosis Date   Anemia    Anxiety "fast hearbeat"   took Benadryl at end of pregnancy   Back pain affecting pregnancy in first trimester 93/26/7124   Complication of anesthesia    Epidural 1 sided   Depression    During 1 pregnancy   Diabetes mellitus without complication ([place])    Endometriosis    Gallstones    Gestational diabetes    just started checking BS 03/05/12   Headache(784.0)    Hematuria 05/27/2015   History of kidney stones    Irregular periods    Pregnancy induced hypertension    Recurrent upper respiratory infection (URI)    Mostly when pregnant   Spotting affecting pregnancy in first trimester 01/27/2015    Past Surgical History:  Procedure Laterality Date   CHOLECYSTECTOMY N/A 03/20/2019   Procedure: LAPAROSCOPIC CHOLECYSTECTOMY;  Surgeon: Aviva Signs, MD;  Location: [facility];  Service: General;  Laterality: N/A;   MULTIPLE TOOTH EXTRACTIONS     WISDOM TOOTH EXTRACTION      Family History  Problem Relation Age of Onset   Hypertension Mother    Anxiety disorder Mother    Depression Mother    Hypertension Father    Anxiety disorder Sister    ADD / ADHD Daughter    Asthma Daughter    ADD / ADHD Daughter    Seizures Daughter    ADD / ADHD  Daughter    ADD / ADHD Daughter    Heart disease Maternal Grandmother    Cancer Maternal Grandmother        brain   Heart failure Maternal Grandmother    Breast cancer Maternal Grandmother    Cancer Paternal Grandmother        brain   Social History:  reports that she quit smoking about 22 years ago. Her smoking use included cigarettes. She started smoking about 23 years ago. She has a 0.05 pack-year smoking history. She has never used smokeless tobacco. She reports that she does not currently use alcohol. She reports that she does not use drugs.  Allergies:  Allergies  Allergen Reactions   Sulfamethoxazole-Trimethoprim Swelling and Rash    Medications Prior to Admission  Medication Sig Dispense Refill   Accu-Chek FastClix Lancets MISC Use as directed to check blood sugar 4 times daily 100 each 12   aspirin 81 MG EC tablet Take 2 tablets (162 mg total) by mouth daily. Swallow whole. 180 tablet 2   azelastine (ASTELIN) 0.1 % nasal spray Place 1 spray into both nostrils 2 (two) times daily. Use in each nostril as directed     Blood Glucose Monitoring Suppl (ACCU-CHEK GUIDE ME) w/Device KIT 1 each by Does not apply route 4 (four) times daily.  1 kit 0   Blood Pressure Monitor MISC For regular home bp monitoring during pregnancy 1 each 0   citalopram (CELEXA) 40 MG tablet Take 40 mg by mouth daily.     Doxylamine-Pyridoxine (DICLEGIS) 10-10 MG TBEC 2 tabs q hs, if sx persist add 1 tab q am on day 3, if sx persist add 1 tab q afternoon on day 4 100 tablet 6   glucose blood (ACCU-CHEK GUIDE) test strip Use as instructed to check blood sugar 4 times daily 50 each 12   labetalol (NORMODYNE) 200 MG tablet Take 1 tablet (200 mg total) by mouth 2 (two) times daily. 60 tablet 3   loratadine (CLARITIN) 10 MG tablet Take 10 mg by mouth at bedtime.     Pediatric Multivitamins-Iron (FLINTSTONES COMPLETE PO) Take 2 tablets by mouth.      Results for orders placed or performed during the hospital  encounter of 09/03/21 (from the past 48 hour(s))  Urinalysis, Routine w reflex microscopic Urine, Clean Catch     Status: Abnormal   Collection Time: 09/03/21 10:20 AM  Result Value Ref Range   Color, Urine YELLOW YELLOW   APPearance CLEAR CLEAR   Specific Gravity, Urine 1.021 1.005 - 1.030   pH 7.0 5.0 - 8.0   Glucose, UA NEGATIVE NEGATIVE mg/dL   Hgb urine dipstick NEGATIVE NEGATIVE   Bilirubin Urine NEGATIVE NEGATIVE   Ketones, ur 80 (A) NEGATIVE mg/dL   Protein, ur 30 (A) NEGATIVE mg/dL   Nitrite NEGATIVE NEGATIVE   Leukocytes,Ua NEGATIVE NEGATIVE   RBC / HPF 0-5 0 - 5 RBC/hpf   WBC, UA 0-5 0 - 5 WBC/hpf   Bacteria, UA RARE (A) NONE SEEN   Squamous Epithelial / LPF 0-5 0 - 5   Mucus PRESENT     Comment: Performed at [facility], [address] [place], [place] [address]  Rapid urine drug screen (hospital performed)     Status: None   Collection Time: 09/03/21 10:20 AM  Result Value Ref Range   Opiates NONE DETECTED NONE DETECTED   Cocaine NONE DETECTED NONE DETECTED   Benzodiazepines NONE DETECTED NONE DETECTED   Amphetamines NONE DETECTED NONE DETECTED   Tetrahydrocannabinol NONE DETECTED NONE DETECTED   Barbiturates NONE DETECTED NONE DETECTED    Comment: (NOTE) DRUG SCREEN FOR MEDICAL PURPOSES ONLY.  IF CONFIRMATION IS NEEDED FOR ANY PURPOSE, NOTIFY LAB WITHIN 5 DAYS.  LOWEST DETECTABLE LIMITS FOR URINE DRUG SCREEN Drug Class                     Cutoff (ng/mL) Amphetamine and metabolites    1000 Barbiturate and metabolites    200 Benzodiazepine                 200 Tricyclics and metabolites     300 Opiates and metabolites        300 Cocaine and metabolites        300 THC                            50 Performed at [facility], [address] [place], [place] [address]   Glucose, capillary     Status: Abnormal   Collection Time: 09/03/21 11:29 AM  Result Value Ref Range   Glucose-Capillary 118 (H) 70 - 99 mg/dL    Comment: Glucose reference  range applies only to samples taken after fasting for at least 8   hours.  CBC with Differential/Platelet     Status: Abnormal   Collection Time: 09/03/21 11:41 AM  Result Value Ref Range   WBC 12.3 (H) 4.0 - 10.5 K/uL   RBC 4.21 3.87 - 5.11 MIL/uL   Hemoglobin 12.0 12.0 - 15.0 g/dL   HCT 36.4 36.0 - 46.0 %   MCV 86.5 80.0 - 100.0 fL   MCH 28.5 26.0 - 34.0 pg   MCHC 33.0 30.0 - 36.0 g/dL   RDW 14.5 11.5 - 15.5 %   Platelets 281 150 - 400 K/uL   nRBC 0.0 0.0 - 0.2 %   Neutrophils Relative % 90 %   Neutro Abs 11.2 (H) 1.7 - 7.7 K/uL   Lymphocytes Relative 6 %   Lymphs Abs 0.7 0.7 - 4.0 K/uL   Monocytes Relative 3 %   Monocytes Absolute 0.4 0.1 - 1.0 K/uL   Eosinophils Relative 0 %   Eosinophils Absolute 0.0 0.0 - 0.5 K/uL   Basophils Relative 0 %   Basophils Absolute 0.0 0.0 - 0.1 K/uL   Immature Granulocytes 1 %   Abs Immature Granulocytes 0.06 0.00 - 0.07 K/uL    Comment: Performed at [facility], [address] [place], [place] [address]  Lipase, blood     Status: None   Collection Time: 09/03/21 11:41 AM  Result Value Ref Range   Lipase 21 11 - 51 U/L    Comment: Performed at [facility] [address] [place], [place] [address]  Basic metabolic panel     Status: Abnormal   Collection Time: 09/03/21 11:41 AM  Result Value Ref Range   Sodium 136 135 - 145 mmol/L   Potassium 3.5 3.5 - 5.1 mmol/L   Chloride 107 98 - 111 mmol/L   CO2 18 (L) 22 - 32 mmol/L   Glucose, Bld 113 (H) 70 - 99 mg/dL    Comment: Glucose reference range applies only to samples taken after fasting for at least 8 hours.   BUN 8 6 - 20 mg/dL   Creatinine, Ser 0.49 0.44 - 1.00 mg/dL   Calcium 9.6 8.9 - 10.3 mg/dL   GFR, Estimated >60 >60 mL/min    Comment: (NOTE) Calculated using the CKD-EPI Creatinine Equation (2021)    Anion gap 11 5 - 15    Comment: Performed at [facility] [address] [place], [place] [address]   MR PELVIS WO CONTRAST  Result Date:  09/03/2021 CLINICAL DATA:  Pregnancy with right lower quadrant pain EXAM: MRI PELVIS WITHOUT CONTRAST TECHNIQUE: Multiplanar multisequence MR imaging of the pelvis was performed. No intravenous contrast was administered. COMPARISON:  None Available. FINDINGS: Urinary Tract:  Bladder is unremarkable. Bowel: The appendix is identified measuring 1 cm in maximum diameter (normal 6 mm or less), image 15/5. Mild appendiceal wall thickening is noted. Right lower quadrant periappendiceal fluid is identified. No abscess identified. Vascular/Lymphatic: No pathologically enlarged lymph nodes. No significant vascular abnormality seen. Reproductive:  Gravid uterus.  No adnexal mass. Other: Right lower quadrant free fluid identified extending into the right Eck Musculoskeletal: Region around the right ovary. IMPRESSION: 1. Imaging findings compatible with acute appendicitis. The appendix is thickened measuring 1 cm in maximum diameter. There is mild appendiceal wall thickening with right lower quadrant free fluid. No focal fluid collection to suggest appendiceal abscess. 2. Gravid uterus. Electronically Signed   By: Kerby Moors M.D.   On: 09/03/2021 18:14   MR ABDOMEN WO CONTRAST  Result Date:  09/03/2021 CLINICAL DATA:  Sixteen weeks pregnant. Abdominal pain which began at 4 a.m. this morning. Vomiting. Right lower quadrant pain. EXAM: MRI ABDOMEN WITHOUT CONTRAST TECHNIQUE: Multiplanar multisequence MR imaging was performed without the administration of intravenous contrast. COMPARISON:  None Available. FINDINGS: Lower chest: No acute findings. Hepatobiliary: Status post cholecystectomy. No focal liver abnormality identified. Mild hepatic steatosis. There is no bile duct dilatation. Pancreas: No mass, inflammatory changes, or other parenchymal abnormality identified. Spleen:  Within normal limits in size and appearance. Adrenals/Urinary Tract: No masses identified. No evidence of hydronephrosis. Stomach/Bowel: Visualized  portions within the abdomen are unremarkable. Vascular/Lymphatic: No pathologically enlarged lymph nodes identified. No abdominal aortic aneurysm demonstrated. Other:  No significant free fluid or signs of fluid collection. Musculoskeletal: No suspicious bone lesions identified. IMPRESSION: 1. No acute findings identified within the abdomen. Note: This exam only includes images of the abdomen. Exam does not extend through the pelvis. The pelvic bowel loops including the appendix are not included on this exam. 2. Status post cholecystectomy. 3. Mild hepatic steatosis. Electronically Signed   By: Taylor  Stroud M.D.   On: 09/03/2021 16:30    Review of Systems  Constitutional:  Negative for chills and fever.  HENT:  Negative for ear discharge, hearing loss and sore throat.   Eyes:  Negative for discharge.  Respiratory:  Negative for cough and shortness of breath.   Cardiovascular:  Negative for chest pain and leg swelling.  Gastrointestinal:  Positive for abdominal pain, nausea and vomiting. Negative for constipation and diarrhea.  Musculoskeletal:  Negative for myalgias and neck pain.  Skin:  Negative for rash.  Allergic/Immunologic: Negative for environmental allergies.  Neurological:  Negative for dizziness and seizures.  Hematological:  Does not bruise/bleed easily.  Psychiatric/Behavioral:  Negative for suicidal ideas.   All other systems reviewed and are negative.  Blood pressure 136/77, pulse 99, temperature 98.9 F (37.2 C), temperature source Oral, resp. rate 16, height 5' 3" (1.6 m), weight 102.9 kg, last menstrual period 05/03/2021, SpO2 97 %. Physical Exam Constitutional:      Appearance: She is well-developed.     Comments: Conversant No acute distress  HENT:     Head: Normocephalic and atraumatic.  Eyes:     General: Lids are normal. No scleral icterus.    Pupils: Pupils are equal, round, and reactive to light.     Comments: Pupils are equal round and reactive No lid  lag Moist conjunctiva  Neck:     Thyroid: No thyromegaly.     Trachea: No tracheal tenderness.     Comments: No cervical lymphadenopathy Cardiovascular:     Rate and Rhythm: Normal rate and regular rhythm.     Heart sounds: No murmur heard. Pulmonary:     Effort: Pulmonary effort is normal.     Breath sounds: Normal breath sounds. No wheezing or rales.  Abdominal:     Tenderness: There is abdominal tenderness in the right upper quadrant and right lower quadrant.     Hernia: No hernia is present.     Comments: Gravid abdomen   Musculoskeletal:     Cervical back: Normal range of motion and neck supple.  Skin:    General: Skin is warm.     Findings: No rash.     Nails: There is no clubbing.     Comments: Normal skin turgor  Neurological:     Mental Status: She is alert and oriented to person, place, and time.     Comments: Normal gait and   station  Psychiatric:        Mood and Affect: Mood normal.        Thought Content: Thought content normal.        Judgment: Judgment normal.     Comments: Appropriate affect     Assessment/Plan 41 year old female [redacted] weeks pregnant, with acute appendicitis. HTN DM  1.  We will proceed to the operating for a laparoscopic appendectomy. 2. I discussed with the patient the risks benefits of the procedure to include but not limited to: Infection, bleeding, damage to surrounding structures, possible ileus, possible postoperative infection, and possible early labor. Patient voiced understanding and wishes to proceed.   Ralene Ok, MD 09/03/2021, 7:09 PM

## 2021-09-04 ENCOUNTER — Inpatient Hospital Stay (HOSPITAL_BASED_OUTPATIENT_CLINIC_OR_DEPARTMENT_OTHER): Payer: Medicaid Other

## 2021-09-04 ENCOUNTER — Encounter (HOSPITAL_COMMUNITY): Payer: Self-pay | Admitting: Obstetrics and Gynecology

## 2021-09-04 ENCOUNTER — Inpatient Hospital Stay (HOSPITAL_COMMUNITY)
Admission: AD | Admit: 2021-09-04 | Discharge: 2021-09-04 | Disposition: A | Discharge status: Home / Self Care | Payer: Medicaid Other | Attending: Obstetrics and Gynecology | Admitting: Obstetrics and Gynecology

## 2021-09-04 DIAGNOSIS — O468X2 Other antepartum hemorrhage, second trimester: Secondary | ICD-10-CM | POA: Diagnosis present

## 2021-09-04 DIAGNOSIS — O209 Hemorrhage in early pregnancy, unspecified: Secondary | ICD-10-CM

## 2021-09-04 DIAGNOSIS — O4692 Antepartum hemorrhage, unspecified, second trimester: Secondary | ICD-10-CM | POA: Diagnosis not present

## 2021-09-04 DIAGNOSIS — I10 Essential (primary) hypertension: Secondary | ICD-10-CM | POA: Diagnosis not present

## 2021-09-04 DIAGNOSIS — F418 Other specified anxiety disorders: Secondary | ICD-10-CM | POA: Diagnosis not present

## 2021-09-04 DIAGNOSIS — Z3A16 16 weeks gestation of pregnancy: Secondary | ICD-10-CM | POA: Insufficient documentation

## 2021-09-04 DIAGNOSIS — K3580 Unspecified acute appendicitis: Secondary | ICD-10-CM | POA: Diagnosis not present

## 2021-09-04 DIAGNOSIS — O4402 Placenta previa specified as without hemorrhage, second trimester: Secondary | ICD-10-CM | POA: Insufficient documentation

## 2021-09-04 NOTE — MAU Note (Signed)
.  [name] is a 41 y.o. at [redacted]w[redacted]d here in MAU reporting: post op today following lap appy. Pt states she went home from PACU at 2330 she called the nurse line and was told to return -pt reports single episode of red bleeding and passed 2 clots in to toilet-unsure of size. Pt arrived with 1st peri pad on and it is clear of vaginal bleeding or discharge. Pt states she is unaware of contractions or cramping-her only discomfort is post op.  Onset of complaint: 2330 Pain score: 6 post op abdominal Vitals:   09/04/21 0019  BP: 128/78  Pulse: 98  Resp: 16  Temp: 98.7 F (37.1 C)  SpO2: 96%     FHT:184bpm

## 2021-09-04 NOTE — MAU Provider Note (Signed)
History     CSN: [id number]  Arrival date and time: 09/04/21 0004   Event Date/Time   First Provider Initiated Contact with Patient 09/04/21 0058      Chief Complaint  Patient presents with   Vaginal Bleeding   [name] is a 41 y.o. [id number] at 61w5dwho receives care at [facility].  She presents today for Vaginal Bleeding.  Patient reports bleeding started shortly after discharge.  She states she had 1 incident and passed 2 clots during that time, but patient was unable to determine size of clots due to them going in the toilet.  Patient states she has not noticed any other bleeding and only has pain associated with recent appendectomy.  Patient denies other issues.   OB History     Gravida  9   Para  6   Term  6   Preterm  0   AB  2   Living  6      SAB  2   IAB      Ectopic      Multiple  0   Live Births  6        Obstetric Comments  Dr. FNestor LewandowskyTree.          Past Medical History:  Diagnosis Date   Anemia    Anxiety "fast hearbeat"   took Benadryl at end of pregnancy   Back pain affecting pregnancy in first trimester 000/86/7619  Complication of anesthesia    Epidural 1 sided   Depression    During 1 pregnancy   Diabetes mellitus without complication ([place]    Endometriosis    Gallstones    Gestational diabetes    just started checking BS 03/05/12   Headache(784.0)    Hematuria 05/27/2015   History of kidney stones    Irregular periods    Pregnancy induced hypertension    Recurrent upper respiratory infection ([place])    Mostly when pregnant   Spotting affecting pregnancy in first trimester 01/27/2015    Past Surgical History:  Procedure Laterality Date   APPENDECTOMY Right 09/03/2021   CHOLECYSTECTOMY N/A 03/20/2019   Procedure: LAPAROSCOPIC CHOLECYSTECTOMY;  Surgeon: JAviva Signs MD;  Location: [facility];  Service: General;  Laterality: N/A;   MULTIPLE TOOTH EXTRACTIONS     WISDOM TOOTH EXTRACTION      Family History   Problem Relation Age of Onset   Hypertension Mother    Anxiety disorder Mother    Depression Mother    Hypertension Father    Anxiety disorder Sister    ADD / ADHD Daughter    Asthma Daughter    ADD / ADHD Daughter    Seizures Daughter    ADD / ADHD Daughter    ADD / ADHD Daughter    Heart disease Maternal Grandmother    Cancer Maternal Grandmother        brain   Heart failure Maternal Grandmother    Breast cancer Maternal Grandmother    Cancer Paternal Grandmother        brain    Social History   Tobacco Use   Smoking status: Former    Packs/day: 0.10    Years: 0.50    Pack years: 0.05    Types: Cigarettes    Start date: 02/19/1998    Quit date: 09/17/1998    Years since quitting: 22.9   Smokeless tobacco: Never  Vaping Use   Vaping Use: Never used  Substance Use Topics   Alcohol use: Not  Currently    Comment: not while pregnant   Drug use: No    Allergies:  Allergies  Allergen Reactions   Sulfamethoxazole-Trimethoprim Swelling and Rash    Medications Prior to Admission  Medication Sig Dispense Refill Last Dose   aspirin 81 MG EC tablet Take 2 tablets (162 mg total) by mouth daily. Swallow whole. 180 tablet 2 Past Week   azelastine (ASTELIN) 0.1 % nasal spray Place 1 spray into both nostrils 2 (two) times daily. Use in each nostril as directed   09/03/2021   citalopram (CELEXA) 40 MG tablet Take 40 mg by mouth daily.   09/03/2021   labetalol (NORMODYNE) 200 MG tablet Take 1 tablet (200 mg total) by mouth 2 (two) times daily. 60 tablet 3 09/03/2021   loratadine (CLARITIN) 10 MG tablet Take 10 mg by mouth at bedtime.   09/03/2021   Pediatric Multivitamins-Iron (FLINTSTONES COMPLETE PO) Take 2 tablets by mouth.   09/03/2021   Accu-Chek FastClix Lancets MISC Use as directed to check blood sugar 4 times daily 100 each 12    Blood Glucose Monitoring Suppl (ACCU-CHEK GUIDE ME) w/Device KIT 1 each by Does not apply route 4 (four) times daily. 1 kit 0    Blood Pressure  Monitor MISC For regular home bp monitoring during pregnancy 1 each 0    Doxylamine-Pyridoxine (DICLEGIS) 10-10 MG TBEC 2 tabs q hs, if sx persist add 1 tab q am on day 3, if sx persist add 1 tab q afternoon on day 4 100 tablet 6    glucose blood (ACCU-CHEK GUIDE) test strip Use as instructed to check blood sugar 4 times daily 50 each 12    oxyCODONE-acetaminophen (PERCOCET) 5-325 MG tablet Take 1 tablet by mouth every 4 (four) hours as needed for severe pain. 20 tablet 0     Review of Systems  Gastrointestinal:  Negative for abdominal pain.  Genitourinary:  Positive for vaginal bleeding. Negative for difficulty urinating and dysuria.  Neurological:  Negative for dizziness, light-headedness and headaches.  Physical Exam   Blood pressure 128/78, pulse 98, temperature 98.7 F (37.1 C), temperature source Oral, resp. rate 16, height $RemoveBe'5\' 3"'FRUnfFztq$  (1.6 m), weight 105.7 kg, last menstrual period 05/03/2021, SpO2 96 %.  Physical Exam Vitals reviewed. Exam conducted with a chaperone present.  Constitutional:      Appearance: Normal appearance.  HENT:     Head: Normocephalic and atraumatic.  Eyes:     Conjunctiva/sclera: Conjunctivae normal.  Genitourinary:    Comments: Speculum Exam: -Normal External Genitalia: Non tender, no apparent blood at introitus.  -Vaginal Vault: Pink mucosa with good rugae. Small amt dark red mucoid blood removed with faux swab x 2. -Cervix:Pink, no lesions, cysts, or polyps.  Appears open. No active bleeding from os -Bimanual Exam: Dilation: Closed Exam by:: Gavin Pound CNM Musculoskeletal:        General: Normal range of motion.  Skin:    General: Skin is warm and dry.  Neurological:     Mental Status: She is alert and oriented to person, place, and time.  Psychiatric:        Mood and Affect: Mood normal.        Behavior: Behavior normal.    MAU Course  Procedures Results for orders placed or performed during the hospital encounter of 09/03/21 (from the past  24 hour(s))  Urinalysis, Routine w reflex microscopic Urine, Clean Catch     Status: Abnormal   Collection Time: 09/03/21 10:20 AM  Result Value Ref  Range   Color, Urine YELLOW YELLOW   APPearance CLEAR CLEAR   Specific Gravity, Urine 1.021 1.005 - 1.030   pH 7.0 5.0 - 8.0   Glucose, UA NEGATIVE NEGATIVE mg/dL   Hgb urine dipstick NEGATIVE NEGATIVE   Bilirubin Urine NEGATIVE NEGATIVE   Ketones, ur 80 (A) NEGATIVE mg/dL   Protein, ur 30 (A) NEGATIVE mg/dL   Nitrite NEGATIVE NEGATIVE   Leukocytes,Ua NEGATIVE NEGATIVE   RBC / HPF 0-5 0 - 5 RBC/hpf   WBC, UA 0-5 0 - 5 WBC/hpf   Bacteria, UA RARE (A) NONE SEEN   Squamous Epithelial / LPF 0-5 0 - 5   Mucus PRESENT   Rapid urine drug screen (hospital performed)     Status: None   Collection Time: 09/03/21 10:20 AM  Result Value Ref Range   Opiates NONE DETECTED NONE DETECTED   Cocaine NONE DETECTED NONE DETECTED   Benzodiazepines NONE DETECTED NONE DETECTED   Amphetamines NONE DETECTED NONE DETECTED   Tetrahydrocannabinol NONE DETECTED NONE DETECTED   Barbiturates NONE DETECTED NONE DETECTED  Glucose, capillary     Status: Abnormal   Collection Time: 09/03/21 11:29 AM  Result Value Ref Range   Glucose-Capillary 118 (H) 70 - 99 mg/dL  CBC with Differential/Platelet     Status: Abnormal   Collection Time: 09/03/21 11:41 AM  Result Value Ref Range   WBC 12.3 (H) 4.0 - 10.5 K/uL   RBC 4.21 3.87 - 5.11 MIL/uL   Hemoglobin 12.0 12.0 - 15.0 g/dL   HCT 36.4 36.0 - 46.0 %   MCV 86.5 80.0 - 100.0 fL   MCH 28.5 26.0 - 34.0 pg   MCHC 33.0 30.0 - 36.0 g/dL   RDW 14.5 11.5 - 15.5 %   Platelets 281 150 - 400 K/uL   nRBC 0.0 0.0 - 0.2 %   Neutrophils Relative % 90 %   Neutro Abs 11.2 (H) 1.7 - 7.7 K/uL   Lymphocytes Relative 6 %   Lymphs Abs 0.7 0.7 - 4.0 K/uL   Monocytes Relative 3 %   Monocytes Absolute 0.4 0.1 - 1.0 K/uL   Eosinophils Relative 0 %   Eosinophils Absolute 0.0 0.0 - 0.5 K/uL   Basophils Relative 0 %   Basophils  Absolute 0.0 0.0 - 0.1 K/uL   Immature Granulocytes 1 %   Abs Immature Granulocytes 0.06 0.00 - 0.07 K/uL  Lipase, blood     Status: None   Collection Time: 09/03/21 11:41 AM  Result Value Ref Range   Lipase 21 11 - 51 U/L  Basic metabolic panel     Status: Abnormal   Collection Time: 09/03/21 11:41 AM  Result Value Ref Range   Sodium 136 135 - 145 mmol/L   Potassium 3.5 3.5 - 5.1 mmol/L   Chloride 107 98 - 111 mmol/L   CO2 18 (L) 22 - 32 mmol/L   Glucose, Bld 113 (H) 70 - 99 mg/dL   BUN 8 6 - 20 mg/dL   Creatinine, Ser 0.49 0.44 - 1.00 mg/dL   Calcium 9.6 8.9 - 10.3 mg/dL   GFR, Estimated >60 >60 mL/min   Anion gap 11 5 - 15  Glucose, capillary     Status: None   Collection Time: 09/03/21  9:17 PM  Result Value Ref Range   Glucose-Capillary 86 70 - 99 mg/dL     MDM Pelvic Exam Ultrasound Assessment and Plan  41 year old, [id number]  SIUP at 16.5 weeks Vaginal Bleeding  -Reviewed POC  with patient. -Exam performed and findings discussed.  -Reassured that no active bleeding occurring. -Discussed sending for [place] for further reassurance and evaluation of placenta. Patient agreeable. -Order placed.    Maryann Conners 09/04/2021, 12:59 AM   Reassessment (1:57 AM)  -Preliminary [place] results return with previa noted. -Provider to bedside to discuss findings. -Reviewed diagnosis, management, and prognosis.  -Informed that results are preliminary and MD will read when available. -If previa is persistent primary ob will discuss and formulate POC. -Bleeding precautions given. -Pelvic rest implemented. -Keep follow up as scheduled.  Maryann Conners MSN, CNM Advanced Practice Provider, [facility]

## 2021-09-05 ENCOUNTER — Telehealth: Payer: Self-pay | Admitting: Obstetrics & Gynecology

## 2021-09-05 LAB — SURGICAL PATHOLOGY

## 2021-09-05 NOTE — Telephone Encounter (Signed)
Discussed pt's concerns with Vonzella Nipple, PA who reviewed the chart and gave instructions and advice to be relayed to the pt.  Returned pt's call, two identifiers used. Informed pt of Julie's advice to continue the ASA daily, keep scheduled [place] appt on 6/28, and monitor for bleeding. Pt stated that she only saw a little pink when she wiped after using the bathroom. She was instructed to go to MAU if her bleeding was any more than spotting, making sure she continued pelvic rest, and didn't do any strenuous activity or heavy lifting. Pt stated that she was already limited to no more than 5 lbs d/t recent appendectomy. Pt confirmed understanding, all questions answered.

## 2021-09-05 NOTE — Telephone Encounter (Signed)
Patient went to MAU this weekend. Can you look at discharges notes and see if she needs to be scheduled sooner and wanted to see if she should be taking aspirin with what she  has going on.

## 2021-09-11 ENCOUNTER — Telehealth: Payer: Self-pay | Admitting: Clinical

## 2021-09-11 NOTE — Telephone Encounter (Signed)
Call regarding referral; pt declines visit at this time, but agrees to call [facility] at [phone] as needed in the future.

## 2021-09-21 DIAGNOSIS — Z789 Other specified health status: Secondary | ICD-10-CM | POA: Diagnosis not present

## 2021-09-21 DIAGNOSIS — F419 Anxiety disorder, unspecified: Secondary | ICD-10-CM | POA: Diagnosis not present

## 2021-09-21 DIAGNOSIS — J019 Acute sinusitis, unspecified: Secondary | ICD-10-CM | POA: Diagnosis not present

## 2021-09-21 DIAGNOSIS — Z6837 Body mass index (BMI) 37.0-37.9, adult: Secondary | ICD-10-CM | POA: Diagnosis not present

## 2021-09-21 DIAGNOSIS — Z299 Encounter for prophylactic measures, unspecified: Secondary | ICD-10-CM | POA: Diagnosis not present

## 2021-09-22 ENCOUNTER — Ambulatory Visit (HOSPITAL_COMMUNITY): Payer: Medicaid Other | Attending: Cardiology

## 2021-09-22 DIAGNOSIS — R0602 Shortness of breath: Secondary | ICD-10-CM | POA: Diagnosis not present

## 2021-09-22 DIAGNOSIS — R0789 Other chest pain: Secondary | ICD-10-CM

## 2021-09-22 LAB — ECHOCARDIOGRAM COMPLETE
Area-P 1/2: 3.95 cm2
S' Lateral: 2.8 cm

## 2021-09-25 ENCOUNTER — Other Ambulatory Visit: Payer: Self-pay

## 2021-09-25 ENCOUNTER — Encounter (HOSPITAL_COMMUNITY): Payer: Self-pay | Admitting: Emergency Medicine

## 2021-09-25 ENCOUNTER — Emergency Department (HOSPITAL_COMMUNITY)
Admission: EM | Admit: 2021-09-25 | Discharge: 2021-09-26 | Discharge status: Against Medical Advice | Payer: Medicaid Other | Attending: Physician Assistant | Admitting: Physician Assistant

## 2021-09-25 DIAGNOSIS — G4489 Other headache syndrome: Secondary | ICD-10-CM | POA: Diagnosis not present

## 2021-09-25 DIAGNOSIS — R079 Chest pain, unspecified: Secondary | ICD-10-CM | POA: Diagnosis not present

## 2021-09-25 DIAGNOSIS — O26892 Other specified pregnancy related conditions, second trimester: Secondary | ICD-10-CM | POA: Insufficient documentation

## 2021-09-25 DIAGNOSIS — O99412 Diseases of the circulatory system complicating pregnancy, second trimester: Secondary | ICD-10-CM | POA: Diagnosis not present

## 2021-09-25 DIAGNOSIS — I10 Essential (primary) hypertension: Secondary | ICD-10-CM | POA: Diagnosis not present

## 2021-09-25 DIAGNOSIS — Z3A19 19 weeks gestation of pregnancy: Secondary | ICD-10-CM | POA: Insufficient documentation

## 2021-09-25 DIAGNOSIS — R0602 Shortness of breath: Secondary | ICD-10-CM | POA: Insufficient documentation

## 2021-09-25 DIAGNOSIS — R0789 Other chest pain: Secondary | ICD-10-CM | POA: Diagnosis not present

## 2021-09-25 DIAGNOSIS — Z5321 Procedure and treatment not carried out due to patient leaving prior to being seen by health care provider: Secondary | ICD-10-CM | POA: Diagnosis not present

## 2021-09-25 DIAGNOSIS — R52 Pain, unspecified: Secondary | ICD-10-CM | POA: Diagnosis not present

## 2021-09-25 DIAGNOSIS — I491 Atrial premature depolarization: Secondary | ICD-10-CM | POA: Diagnosis not present

## 2021-09-25 LAB — COMPREHENSIVE METABOLIC PANEL
ALT: 15 U/L (ref 0–44)
AST: 29 U/L (ref 15–41)
Albumin: 3.3 g/dL — ABNORMAL LOW (ref 3.5–5.0)
Alkaline Phosphatase: 32 U/L — ABNORMAL LOW (ref 38–126)
Anion gap: 12 (ref 5–15)
BUN: 7 mg/dL (ref 6–20)
CO2: 20 mmol/L — ABNORMAL LOW (ref 22–32)
Calcium: 10.3 mg/dL (ref 8.9–10.3)
Chloride: 106 mmol/L (ref 98–111)
Creatinine, Ser: 0.5 mg/dL (ref 0.44–1.00)
GFR, Estimated: >60 mL/min (ref >60)
Glucose, Bld: 93 mg/dL (ref 70–99)
Potassium: 4.4 mmol/L (ref 3.5–5.1)
Sodium: 138 mmol/L (ref 135–145)
Total Bilirubin: 0.8 mg/dL (ref 0.3–1.2)
Total Protein: 6.6 g/dL (ref 6.5–8.1)

## 2021-09-25 LAB — CBC WITH DIFFERENTIAL/PLATELET
Abs Immature Granulocytes: 0.03 10*3/uL (ref 0.00–0.07)
Basophils Absolute: 0 10*3/uL (ref 0.0–0.1)
Basophils Relative: 1 %
Eosinophils Absolute: 0.1 10*3/uL (ref 0.0–0.5)
Eosinophils Relative: 2 %
HCT: 37.6 % (ref 36.0–46.0)
Hemoglobin: 11.5 g/dL — ABNORMAL LOW (ref 12.0–15.0)
Immature Granulocytes: 0 %
Lymphocytes Relative: 21 %
Lymphs Abs: 1.5 10*3/uL (ref 0.7–4.0)
MCH: 28.6 pg (ref 26.0–34.0)
MCHC: 30.6 g/dL (ref 30.0–36.0)
MCV: 93.5 fL (ref 80.0–100.0)
Monocytes Absolute: 0.7 10*3/uL (ref 0.1–1.0)
Monocytes Relative: 10 %
Neutro Abs: 4.6 10*3/uL (ref 1.7–7.7)
Neutrophils Relative %: 66 %
Platelets: 216 10*3/uL (ref 150–400)
RBC: 4.02 MIL/uL (ref 3.87–5.11)
RDW: 15 % (ref 11.5–15.5)
WBC: 6.9 10*3/uL (ref 4.0–10.5)
nRBC: 0 % (ref 0.0–0.2)

## 2021-09-25 LAB — TROPONIN I (HIGH SENSITIVITY): Troponin I (High Sensitivity): 4 ng/L (ref <18)

## 2021-09-25 LAB — MAGNESIUM: Magnesium: 1.9 mg/dL (ref 1.7–2.4)

## 2021-09-25 NOTE — ED Triage Notes (Signed)
Patient arrived with EMS from home reports central chest pain with mild SOB onset today , no emesis or diaphoresis , denies cough or fever , she is [redacted] weeks pregnant G7P6 , no abdominal cramping or vaginal bleeding .

## 2021-09-25 NOTE — ED Notes (Signed)
Pt stepped outside.  

## 2021-09-25 NOTE — ED Notes (Signed)
Pt called AMA

## 2021-09-25 NOTE — ED Provider Triage Note (Signed)
Emergency Medicine Provider Triage Evaluation Note  [name] , a 41 y.o. female  was evaluated in triage.  Pt complains of chest pain.  She is currently pregnant at [redacted] weeks gestation.  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  She underwent what sounds like an echocardiogram on 09/22/2021.  She is concerned because she continues to be symptomatic.  Denies any vaginal bleeding.  No abdominal pain..  Review of Systems  Positive: Chest pain Negative: Vaginal bleeding  Physical Exam  BP 127/80 (BP Location: Right Arm)   Pulse (!) 102   Temp 99 F (37.2 C) (Oral)   Resp 16   LMP 05/03/2021   SpO2 99%  Gen:   Awake, no distress   Resp:  Normal effort  MSK:   Moves extremities without difficulty  Other:  Slightly tachycardic but no signs of respiratory distress  Medical Decision Making  Medically screening exam initiated at 7:57 PM.  Appropriate orders placed.  [name] was informed that the remainder of the evaluation will be completed by another provider, this initial triage assessment does not replace that evaluation, and the importance of remaining in the ED until their evaluation is complete.  Labs ordered   Dietrich Pates, [place] 09/25/21 1958

## 2021-09-26 ENCOUNTER — Ambulatory Visit (INDEPENDENT_AMBULATORY_CARE_PROVIDER_SITE_OTHER): Payer: Medicaid Other | Admitting: Registered"

## 2021-09-26 ENCOUNTER — Encounter: Payer: Medicaid Other | Attending: Women's Health | Admitting: Registered"

## 2021-09-26 DIAGNOSIS — Z3A13 13 weeks gestation of pregnancy: Secondary | ICD-10-CM | POA: Insufficient documentation

## 2021-09-26 DIAGNOSIS — O24410 Gestational diabetes mellitus in pregnancy, diet controlled: Secondary | ICD-10-CM | POA: Insufficient documentation

## 2021-09-26 DIAGNOSIS — Z713 Dietary counseling and surveillance: Secondary | ICD-10-CM | POA: Insufficient documentation

## 2021-09-26 NOTE — Progress Notes (Signed)
Patient was seen for Gestational Diabetes self-management on 09/26/21.  Start time 1420 and End time 1550  Estimated due date: 02/14/22; [redacted]w[redacted]d  This patient is accompanied in the office by her mother.  Clinical: Medications: reviewed Medical History: h/o 5th preg GDM diet controlled, appendectomy 09/03/21  Labs: OGTT 86/200H/173H, A1c 5.7%   Next MD visit is supposed to be scheduled after [place]. Pt states she was told they are trying to get her scheduled at [facility] for the [place] so she can be scheduled before the June 28.  Dietary and Lifestyle History: Pt states she appendectomy last week and has not been able to be active while recovering. Pt states she likes to dance and will look into armchair exercise.  Pt states she continues to have a lot of stress with having 5 children at home and a messy house.   Pt states she has some friends who had bad side effects with metformin. Pt states she had difficulty swallowing pills and had questions about the size of Metformin in case she might have to take it.  Physical Activity: ADL  Stress: "a lot" Sleep: not assessed  24 hr Recall:  First Meal: sausage, egg, cheese biscuit, 2% milk Snack: none  Second meal: [place] and cheese sandwich (was still hungry but not sure what blood sugar would be so didn't eat more) Snack:  Third meal: fish sandwich, sugar-free drink Snack: sugar-free popcicle Beverages: water, 2% milk  NUTRITION INTERVENTION  Nutrition education (E-1) on the following topics:   Initial Follow-up 06/20  [x]  []  Definition of Gestational Diabetes [x]  []  Why dietary management is important in controlling blood glucose [x]  []  Effects each nutrient has on blood glucose levels [x]  []  Simple carbohydrates vs complex carbohydrates [x]  []  Fluid intake [x]  [x]  Creating a balanced meal plan [x]  []  Carbohydrate counting  [x]  []  When to check blood glucose levels [x]  []  Proper blood glucose monitoring techniques [x]  [x]  Effect of  stress and stress reduction techniques  [x]  [x]  Exercise effect on blood glucose levels, appropriate exercise during pregnancy [x]  []  Importance of limiting caffeine and abstaining from alcohol and smoking [x]  [x]  Medications used for blood sugar control during pregnancy [x]  []  Hypoglycemia and rule of 15 [x]  []  Postpartum self care  Also reviewed hormonal changes that happen in 3rd trimester that would likely cause more difficulty controlling blood sugar and may require medication to control.   Patient received handouts: none  Patient will be seen for follow-up in 5 weeks or as needed.

## 2021-09-27 ENCOUNTER — Other Ambulatory Visit: Payer: Self-pay | Admitting: *Deleted

## 2021-09-27 DIAGNOSIS — R0602 Shortness of breath: Secondary | ICD-10-CM

## 2021-10-03 ENCOUNTER — Other Ambulatory Visit: Payer: Self-pay | Admitting: Obstetrics & Gynecology

## 2021-10-03 DIAGNOSIS — Z363 Encounter for antenatal screening for malformations: Secondary | ICD-10-CM

## 2021-10-04 ENCOUNTER — Other Ambulatory Visit: Payer: Medicaid Other

## 2021-10-04 ENCOUNTER — Ambulatory Visit (INDEPENDENT_AMBULATORY_CARE_PROVIDER_SITE_OTHER): Payer: Medicaid Other | Admitting: Obstetrics & Gynecology

## 2021-10-04 ENCOUNTER — Encounter: Payer: Self-pay | Admitting: Obstetrics & Gynecology

## 2021-10-04 ENCOUNTER — Encounter: Payer: Medicaid Other | Admitting: Obstetrics & Gynecology

## 2021-10-04 ENCOUNTER — Ambulatory Visit (INDEPENDENT_AMBULATORY_CARE_PROVIDER_SITE_OTHER): Payer: Medicaid Other

## 2021-10-04 VITALS — BP 115/78 | HR 96 | Wt 225.2 lb

## 2021-10-04 DIAGNOSIS — O0992 Supervision of high risk pregnancy, unspecified, second trimester: Secondary | ICD-10-CM

## 2021-10-04 DIAGNOSIS — Z3A21 21 weeks gestation of pregnancy: Secondary | ICD-10-CM | POA: Diagnosis not present

## 2021-10-04 DIAGNOSIS — Z363 Encounter for antenatal screening for malformations: Secondary | ICD-10-CM

## 2021-10-04 DIAGNOSIS — O4400 Placenta previa specified as without hemorrhage, unspecified trimester: Secondary | ICD-10-CM

## 2021-10-04 DIAGNOSIS — O4402 Placenta previa specified as without hemorrhage, second trimester: Secondary | ICD-10-CM

## 2021-10-04 DIAGNOSIS — O24410 Gestational diabetes mellitus in pregnancy, diet controlled: Secondary | ICD-10-CM

## 2021-10-04 DIAGNOSIS — O10919 Unspecified pre-existing hypertension complicating pregnancy, unspecified trimester: Secondary | ICD-10-CM

## 2021-10-04 DIAGNOSIS — O0993 Supervision of high risk pregnancy, unspecified, third trimester: Secondary | ICD-10-CM

## 2021-10-04 NOTE — Progress Notes (Signed)
HIGH-RISK PREGNANCY VISIT Patient name: [name] MRN [id number]  Date of birth: September 17, 1980 Chief Complaint:   Routine Prenatal Visit  History of Present Illness:   [name] is a 41 y.o. [id number] female at 40w0dwith an Estimated Date of Delivery: 02/14/22 being seen today for ongoing management of a high-risk pregnancy complicated by: AMA Chronic HTN- on Labetalol Type2 DM/GDMA1- no meds currently, sugars well controlled with diet Rh neg  Today she reports no complaints.   Contractions: Not present. Vag. Bleeding: None.  Movement: Present. denies leaking of fluid.      08/03/2021   10:21 AM 01/19/2020   10:54 AM 12/31/2017    8:59 AM 11/01/2017   10:06 AM 09/19/2017    8:38 AM  Depression screen PHQ 2/9  Decreased Interest 0 0 0 0 0  Down, Depressed, Hopeless 1 0 0 0 1  PHQ - 2 Score 1 0 0 0 1  Altered sleeping 0 0 0 0 0  Tired, decreased energy 1 0 1 0 0  Change in appetite 0 0     Feeling bad or failure about yourself  0 1 0 1 0  Trouble concentrating 0 0 0 0 0  Moving slowly or fidgety/restless 0 0     Suicidal thoughts 0 0 0 0 0  PHQ-9 Score '2 1 1 1 1     ' Current Outpatient Medications  Medication Instructions   Accu-Chek FastClix Lancets MISC Use as directed to check blood sugar 4 times daily   aspirin EC 162 mg, Oral, Daily, Swallow whole.   azelastine (ASTELIN) 0.1 % nasal spray 1 spray, Each Nare, 2 times daily, Use in each nostril as directed   Blood Glucose Monitoring Suppl (ACCU-CHEK GUIDE ME) w/Device KIT 1 each, Does not apply, 4 times daily   Blood Pressure Monitor MISC For regular home bp monitoring during pregnancy   citalopram (CELEXA) 40 mg, Daily   Doxylamine-Pyridoxine (DICLEGIS) 10-10 MG TBEC 2 tabs q hs, if sx persist add 1 tab q am on day 3, if sx persist add 1 tab q afternoon on day 4   glucose blood (ACCU-CHEK GUIDE) test strip Use as instructed to check blood sugar 4 times daily   labetalol (NORMODYNE) 200 mg, Oral, 2 times daily    loratadine (CLARITIN) 10 mg, Oral, Daily at bedtime   oxyCODONE-acetaminophen (PERCOCET) 5-325 MG tablet 1 tablet, Oral, Every 4 hours PRN   Pediatric Multivitamins-Iron (FLINTSTONES COMPLETE PO) 2 tablets, Oral     Review of Systems:   Pertinent items are noted in HPI Denies abnormal vaginal discharge w/ itching/odor/irritation, headaches, visual changes, shortness of breath, chest pain, abdominal pain, severe nausea/vomiting, or problems with urination or bowel movements unless otherwise stated above. Pertinent History Reviewed:  Reviewed past medical,surgical, social, obstetrical and family history.  Reviewed problem list, medications and allergies. Physical Assessment:   Vitals:   10/04/21 1108  BP: 115/78  Pulse: 96  Weight: 225 lb 3.2 oz (102.2 kg)  Body mass index is 39.89 kg/m.           Physical Examination:   General appearance: alert, well appearing, and in no distress  Mental status: normal mood, behavior, speech, dress, motor activity, and thought processes  Skin: warm & dry   Extremities: Edema: None    Cardiovascular: normal heart rate noted  Respiratory: normal respiratory effort, no distress  Abdomen: gravid, soft, non-tender  Pelvic: Cervical exam deferred         Fetal Status:  Movement: Present    Fetal Surveillance Testing today: [place] 21 wks,breech,complete anterior placenta previa,SVP of fluid 5.8 cm,mild right renal pelvic dilation 4 mm,CX 4.2 cm,FHR 152 bpm,EFW 360 g 23%,anatomy complete   Chaperone: N/A     Assessment & Plan:  High-risk pregnancy: [id number] at 42w0dwith an Estimated Date of Delivery: 02/14/22   1) chronic HTN -continue Labetalol 2070mbid  2) Type2DM -diet controlled  3) Placenta previa -reviewed precautions -plan to follow up at next USKorean 4wks  4) RPD -reviewed USKoreaindings -reassured pt and will continue to monitor at USKoreaisits Meds: No orders of the defined types were placed in this encounter.   Labs/procedures  today: anatomy scan  Treatment Plan:  as outlined above  Reviewed: Preterm labor symptoms and general obstetric precautions including but not limited to vaginal bleeding, contractions, leaking of fluid and fetal movement were reviewed in detail with the patient.  All questions were answered. Pt has home bp cuff. Check bp weekly, let usKoreanow if >140/90.   Follow-up: Return in about 4 weeks (around 11/01/2021) for [place] Junction[place]it and growth.   Future Appointments  Date Time Provider DeRiverbend7/18/2023  9:30 AM ToBerniece SalinesDO [facility]7/25/2023 11:15 AM [facility]-EDUCATION [facility]7/26/2023  3:00 PM CWPlacentia FTOBGYN USKoreaWH-FTIMG None  11/01/2021  4:10 PM OzJanyth PupaDO CWH-FT FTOBGYN    No orders of the defined types were placed in this encounter.   JeJanyth PupaDO Attending [facility]

## 2021-10-04 NOTE — Progress Notes (Signed)
[place] 21 wks,breech,complete anterior placenta previa,SVP of fluid 5.8 cm,mild right renal pelvic dilation 4 mm,CX 4.2 cm,FHR 152 bpm,EFW 360 g 23%,anatomy complete

## 2021-10-09 ENCOUNTER — Encounter: Payer: Medicaid Other | Admitting: Obstetrics & Gynecology

## 2021-10-20 DIAGNOSIS — Z6837 Body mass index (BMI) 37.0-37.9, adult: Secondary | ICD-10-CM | POA: Diagnosis not present

## 2021-10-20 DIAGNOSIS — Z3490 Encounter for supervision of normal pregnancy, unspecified, unspecified trimester: Secondary | ICD-10-CM | POA: Diagnosis not present

## 2021-10-20 DIAGNOSIS — J329 Chronic sinusitis, unspecified: Secondary | ICD-10-CM | POA: Diagnosis not present

## 2021-10-20 DIAGNOSIS — Z299 Encounter for prophylactic measures, unspecified: Secondary | ICD-10-CM | POA: Diagnosis not present

## 2021-10-24 ENCOUNTER — Ambulatory Visit (INDEPENDENT_AMBULATORY_CARE_PROVIDER_SITE_OTHER): Payer: Medicaid Other

## 2021-10-24 ENCOUNTER — Other Ambulatory Visit: Payer: Self-pay

## 2021-10-24 ENCOUNTER — Ambulatory Visit: Payer: Medicaid Other | Admitting: Cardiology

## 2021-10-24 ENCOUNTER — Encounter: Payer: Self-pay | Admitting: Cardiology

## 2021-10-24 ENCOUNTER — Encounter (HOSPITAL_COMMUNITY): Payer: Self-pay | Admitting: Obstetrics & Gynecology

## 2021-10-24 ENCOUNTER — Inpatient Hospital Stay (HOSPITAL_COMMUNITY)
Admission: AD | Admit: 2021-10-24 | Discharge: 2021-10-24 | Disposition: A | Discharge status: Home / Self Care | Payer: Medicaid Other | Attending: Obstetrics & Gynecology | Admitting: Obstetrics & Gynecology

## 2021-10-24 VITALS — BP 106/66 | HR 94 | Ht 63.0 in | Wt 226.6 lb

## 2021-10-24 DIAGNOSIS — R002 Palpitations: Secondary | ICD-10-CM | POA: Insufficient documentation

## 2021-10-24 DIAGNOSIS — R42 Dizziness and giddiness: Secondary | ICD-10-CM | POA: Diagnosis not present

## 2021-10-24 DIAGNOSIS — R55 Syncope and collapse: Secondary | ICD-10-CM | POA: Diagnosis not present

## 2021-10-24 DIAGNOSIS — R7303 Prediabetes: Secondary | ICD-10-CM | POA: Diagnosis not present

## 2021-10-24 DIAGNOSIS — O0992 Supervision of high risk pregnancy, unspecified, second trimester: Secondary | ICD-10-CM

## 2021-10-24 DIAGNOSIS — Z8632 Personal history of gestational diabetes: Secondary | ICD-10-CM | POA: Diagnosis not present

## 2021-10-24 DIAGNOSIS — O10919 Unspecified pre-existing hypertension complicating pregnancy, unspecified trimester: Secondary | ICD-10-CM

## 2021-10-24 DIAGNOSIS — O09299 Supervision of pregnancy with other poor reproductive or obstetric history, unspecified trimester: Secondary | ICD-10-CM | POA: Diagnosis not present

## 2021-10-24 DIAGNOSIS — R1084 Generalized abdominal pain: Secondary | ICD-10-CM | POA: Diagnosis not present

## 2021-10-24 DIAGNOSIS — O09522 Supervision of elderly multigravida, second trimester: Secondary | ICD-10-CM | POA: Diagnosis not present

## 2021-10-24 DIAGNOSIS — O368120 Decreased fetal movements, second trimester, not applicable or unspecified: Secondary | ICD-10-CM | POA: Insufficient documentation

## 2021-10-24 DIAGNOSIS — Z3A23 23 weeks gestation of pregnancy: Secondary | ICD-10-CM | POA: Diagnosis not present

## 2021-10-24 LAB — URINALYSIS, ROUTINE W REFLEX MICROSCOPIC
Bilirubin Urine: NEGATIVE
Glucose, UA: NEGATIVE mg/dL
Hgb urine dipstick: NEGATIVE
Ketones, ur: 5 mg/dL — AB
Nitrite: NEGATIVE
Protein, ur: NEGATIVE mg/dL
Specific Gravity, Urine: 1.011 (ref 1.005–1.030)
pH: 6 (ref 5.0–8.0)

## 2021-10-24 MED ORDER — CYCLOBENZAPRINE HCL 5 MG PO TABS
10.0000 mg | ORAL_TABLET | Freq: Once | ORAL | Status: AC
  Administered 2021-10-24: 10 mg via ORAL
  Filled 2021-10-24: qty 2

## 2021-10-24 MED ORDER — LACTATED RINGERS IV BOLUS
1000.0000 mL | Freq: Once | INTRAVENOUS | Status: AC
  Administered 2021-10-24: 1000 mL via INTRAVENOUS

## 2021-10-24 MED ORDER — ACETAMINOPHEN 500 MG PO TABS
1000.0000 mg | ORAL_TABLET | Freq: Once | ORAL | Status: AC
  Administered 2021-10-24: 1000 mg via ORAL
  Filled 2021-10-24: qty 2

## 2021-10-24 NOTE — MAU Provider Note (Signed)
History     CSN: [id number]  Arrival date and time: 10/24/21 0050   Event Date/Time   First Provider Initiated Contact with Patient 10/24/21 0151      Chief Complaint  Patient presents with   Decreased Fetal Movement   HPI Rodney Wigger is a 41 y.o. [id number] at 76w6dwho presents to MAU with chief complaint of decreased fetal movement. This is a new problem, onset today. Patient endorses fetal movement once placed in exam room in MAU. She denies vaginal bleeding, leaking of fluid, fever, falls, or recent illness.   Patient reports generalized discomfort in her right mid-abdomen. Pain worsens when she lies on that side and she thinks it might be related to fetal positioning.  OB History     Gravida  9   Para  6   Term  6   Preterm  0   AB  2   Living  6      SAB  2   IAB      Ectopic      Multiple  0   Live Births  6        Obstetric Comments  Dr. FNestor LewandowskyTree.          Past Medical History:  Diagnosis Date   Anemia    Anxiety "fast hearbeat"   took Benadryl at end of pregnancy   Back pain affecting pregnancy in first trimester 001/77/9390  Complication of anesthesia    Epidural 1 sided   Depression    During 1 pregnancy   Diabetes mellitus without complication (HG[place]ette    Endometriosis    Gallstones    Gestational diabetes    just started checking BS 03/05/12   Headache(784.0)    Hematuria 05/27/2015   History of kidney stones    Irregular periods    Pregnancy induced hypertension    Recurrent upper respiratory infection (URI)    Mostly when pregnant   Spotting affecting pregnancy in first trimester 01/27/2015    Past Surgical History:  Procedure Laterality Date   APPENDECTOMY Right 09/03/2021   CHOLECYSTECTOMY N/A 03/20/2019   Procedure: LAPAROSCOPIC CHOLECYSTECTOMY;  Surgeon: JAviva Signs MD;  Location: [facility];  Service: General;  Laterality: N/A;   LAPAROSCOPIC APPENDECTOMY N/A 09/03/2021   Procedure: APPENDECTOMY  LAPAROSCOPIC;  Surgeon: RRalene Ok MD;  [facility];  Service: General;  Laterality: N/A;   MULTIPLE TOOTH EXTRACTIONS     WISDOM TOOTH EXTRACTION      Family History  Problem Relation Age of Onset   Hypertension Mother    Anxiety disorder Mother    Depression Mother    Hypertension Father    Anxiety disorder Sister    ADD / ADHD Daughter    Asthma Daughter    ADD / ADHD Daughter    Seizures Daughter    ADD / ADHD Daughter    ADD / ADHD Daughter    Heart disease Maternal Grandmother    Cancer Maternal Grandmother        brain   Heart failure Maternal Grandmother    Breast cancer Maternal Grandmother    Cancer Paternal Grandmother        brain    Social History   Tobacco Use   Smoking status: Former    Packs/day: 0.10    Years: 0.50    Total pack years: 0.05    Types: Cigarettes    Start date: 02/19/1998    Quit date: 09/17/1998    Years since quitting:  23.1   Smokeless tobacco: Never  Vaping Use   Vaping Use: Never used  Substance Use Topics   Alcohol use: Not Currently    Comment: not while pregnant   Drug use: No    Allergies:  Allergies  Allergen Reactions   Sulfamethoxazole-Trimethoprim Swelling and Rash    Medications Prior to Admission  Medication Sig Dispense Refill Last Dose   aspirin 81 MG EC tablet Take 2 tablets (162 mg total) by mouth daily. Swallow whole. 180 tablet 2 10/23/2021   labetalol (NORMODYNE) 200 MG tablet Take 1 tablet (200 mg total) by mouth 2 (two) times daily. 60 tablet 3 10/23/2021 at 2100   Pediatric Multivitamins-Iron (FLINTSTONES COMPLETE PO) Take 2 tablets by mouth.   10/23/2021   Accu-Chek FastClix Lancets MISC Use as directed to check blood sugar 4 times daily 100 each 12    azelastine (ASTELIN) 0.1 % nasal spray Place 1 spray into both nostrils 2 (two) times daily. Use in each nostril as directed      Blood Glucose Monitoring Suppl (ACCU-CHEK GUIDE ME) w/Device KIT 1 each by Does not apply route 4 (four) times  daily. 1 kit 0    Blood Pressure Monitor MISC For regular home bp monitoring during pregnancy 1 each 0    citalopram (CELEXA) 40 MG tablet Take 40 mg by mouth daily. (Patient not taking: Reported on 10/04/2021)      Doxylamine-Pyridoxine (DICLEGIS) 10-10 MG TBEC 2 tabs q hs, if sx persist add 1 tab q am on day 3, if sx persist add 1 tab q afternoon on day 4 (Patient not taking: Reported on 10/04/2021) 100 tablet 6    glucose blood (ACCU-CHEK GUIDE) test strip Use as instructed to check blood sugar 4 times daily 50 each 12    loratadine (CLARITIN) 10 MG tablet Take 10 mg by mouth at bedtime.   More than a month   oxyCODONE-acetaminophen (PERCOCET) 5-325 MG tablet Take 1 tablet by mouth every 4 (four) hours as needed for severe pain. (Patient not taking: Reported on 10/04/2021) 20 tablet 0     Review of Systems  Gastrointestinal:  Positive for abdominal pain.  All other systems reviewed and are negative.  Physical Exam   Blood pressure 126/78, pulse 94, temperature 98.1 F (36.7 C), temperature source Oral, resp. rate 17, height '5\' 3"'  (1.6 m), weight 102.5 kg, last menstrual period 05/03/2021, SpO2 95 %.  Physical Exam Vitals and nursing note reviewed. Exam conducted with a chaperone present.  Constitutional:      Appearance: Normal appearance. She is obese. She is not ill-appearing.  Cardiovascular:     Rate and Rhythm: Normal rate and regular rhythm.     Pulses: Normal pulses.     Heart sounds: Normal heart sounds.  Pulmonary:     Effort: Pulmonary effort is normal.     Breath sounds: Normal breath sounds.  Abdominal:     Comments: Gravid  Genitourinary:    Comments: Pelvic exam: External genitalia normal, vaginal walls pink and well rugated, cervix visually closed, no lesions noted. Digital exam deferred due to previa   Skin:    Capillary Refill: Capillary refill takes less than 2 seconds.  Neurological:     Mental Status: She is alert and oriented to person, place, and time.   Psychiatric:        Mood and Affect: Mood normal.        Behavior: Behavior normal.        Thought Content: Thought  content normal.        Judgment: Judgment normal.     MAU Course  Procedures  MDM  --Reactive tracing: baseline 135, mod var, + accels, occasional decels WNL for GA --Toco: UI  Patient Vitals for the past 24 hrs:  BP Temp Temp src Pulse Resp SpO2 Height Weight  10/24/21 0325 116/66 97.6 F (36.4 C) Oral 94 17 97 % -- --  10/24/21 0146 126/78 98.1 F (36.7 C) Oral 94 17 95 % '5\' 3"'  (1.6 m) 102.5 kg   Results for orders placed or performed during the hospital encounter of 10/24/21 (from the past 24 hour(s))  Urinalysis, Routine w reflex microscopic     Status: Abnormal   Collection Time: 10/24/21  1:07 AM  Result Value Ref Range   Color, Urine YELLOW YELLOW   APPearance CLEAR CLEAR   Specific Gravity, Urine 1.011 1.005 - 1.030   pH 6.0 5.0 - 8.0   Glucose, UA NEGATIVE NEGATIVE mg/dL   Hgb urine dipstick NEGATIVE NEGATIVE   Bilirubin Urine NEGATIVE NEGATIVE   Ketones, ur 5 (A) NEGATIVE mg/dL   Protein, ur NEGATIVE NEGATIVE mg/dL   Nitrite NEGATIVE NEGATIVE   Leukocytes,Ua TRACE (A) NEGATIVE   RBC / HPF 0-5 0 - 5 RBC/hpf   WBC, UA 0-5 0 - 5 WBC/hpf   Bacteria, UA MANY (A) NONE SEEN   Squamous Epithelial / LPF 0-5 0 - 5   Mucus PRESENT    Meds ordered this encounter  Medications   lactated ringers bolus 1,000 mL   acetaminophen (TYLENOL) tablet 1,000 mg   cyclobenzaprine (FLEXERIL) tablet 10 mg    Assessment and Plan  --41 y.o. [id number] at [redacted]w[redacted]d --Reactive tracing --Closed cervix --Pain resolved with medication  --Discharge home in stable condition  SDarlina Rumpf MSA, MSN, CNM 10/24/2021, 6:46 AM

## 2021-10-24 NOTE — Progress Notes (Unsigned)
Enrolled patient for a  day Zio XT monitor to be mailed to patients home 

## 2021-10-24 NOTE — Patient Instructions (Addendum)
Medication Instructions:  Your physician recommends that you continue on your current medications as directed. Please refer to the Current Medication list given to you today.  *If you need a refill on your cardiac medications before your next appointment, please call your pharmacy*   Lab Work: None If you have labs (blood work) drawn today and your tests are completely normal, you will receive your results only by: [place] (if you have MyChart) OR A paper copy in the mail If you have any lab test that is abnormal or we need to change your treatment, we will call you to review the results.   Testing/Procedures: [place]- Long Term Monitor Instructions  Your physician has requested you wear a ZIO patch monitor for 7 days.  This is a single patch monitor. [employer] supplies one patch monitor per enrollment. Additional stickers are not available. Please do not apply patch if you will be having a Nuclear Stress Test,  Echocardiogram, Cardiac CT, MRI, or Chest Xray during the period you would be wearing the  monitor. The patch cannot be worn during these tests. You cannot remove and re-apply the  ZIO XT patch monitor.  Your ZIO patch monitor will be mailed 3 day USPS to your address on file. It may take 3-5 days  to receive your monitor after you have been enrolled.  Once you have received your monitor, please review the enclosed instructions. Your monitor  has already been registered assigning a specific monitor serial # to you.  Billing and Patient Assistance Program Information  We have supplied [employer] with any of your insurance information on file for billing purposes. [employer] offers a sliding scale Patient Assistance Program for patients that do not have  insurance, or whose insurance does not completely cover the cost of the ZIO monitor.  You must apply for the Patient Assistance Program to qualify for this discounted rate.  To apply, please call [employer] at [phone], select  option 4, select option 2, ask to apply for  Patient Assistance Program. Theodore Demark will ask your household income, and how many people  are in your household. They will quote your out-of-pocket cost based on that information.  [employer] will also be able to set up a 22-month interest-free payment plan if needed.  Applying the monitor   Shave hair from upper left chest.  Hold abrader disc by orange tab. Rub abrader in 40 strokes over the upper left chest as  indicated in your monitor instructions.  Clean area with 4 enclosed alcohol pads. Let dry.  Apply patch as indicated in monitor instructions. Patch will be placed under collarbone on left  side of chest with arrow pointing upward.  Rub patch adhesive wings for 2 minutes. Remove white label marked "1". Remove the white  label marked "2". Rub patch adhesive wings for 2 additional minutes.  While looking in a mirror, press and release button in center of patch. A small green light will  flash 3-4 times. This will be your only indicator that the monitor has been turned on.  Do not shower for the first 24 hours. You may shower after the first 24 hours.  Press the button if you feel a symptom. You will hear a small click. Record Date, Time and  Symptom in the Patient Logbook.  When you are ready to remove the patch, follow instructions on the last 2 pages of Patient  Logbook. Stick patch monitor onto the last page of Patient Logbook.  Place Patient Logbook in  the blue and white box. Use locking tab on box and tape box closed  securely. The blue and white box has prepaid postage on it. Please place it in the mailbox as  soon as possible. Your physician should have your test results approximately 7 days after the  monitor has been mailed back to [facility].  Call [facility] Customer Care at [phone] if you have questions regarding  your ZIO XT patch monitor. Call them immediately if you see an orange light blinking on your  monitor.   If your monitor falls off in less than 4 days, contact our Monitor department at [phone].  If your monitor becomes loose or falls off after 4 days call [employer] at [phone] for  suggestions on securing your monitor    Follow-Up: At [facility], you and your health needs are our priority.  As part of our continuing mission to provide you with exceptional heart care, we have created designated Provider Care Teams.  These Care Teams include your primary Cardiologist (physician) and Advanced Practice Providers (APPs -  Physician Assistants and Nurse Practitioners) who all work together to provide you with the care you need, when you need it.  We recommend signing up for the patient portal called "MyChart".  Sign up information is provided on this After Visit Summary.  MyChart is used to connect with patients for Virtual Visits (Telemedicine).  Patients are able to view lab/test results, encounter notes, upcoming appointments, etc.  Non-urgent messages can be sent to your provider as well.   To learn more about what you can do with MyChart, go to [email or web address].    Your next appointment:   12 week(s)  The format for your next appointment:   In Person  Provider:   Thomasene Ripple, DO    Other Instructions   Important Information About Sugar

## 2021-10-24 NOTE — MAU Note (Signed)
.  [name] is a 41 y.o. at [redacted]w[redacted]d here in MAU reporting: tonight at 2350 Pt reports sharp pain that radiated from R groin to R rib cage that last approx 5 min with spontaneous resolved-followed by braxton hicks contractions they stopped on the way to the hospital. Pt denies SROM, vaginal bleeding or bloody show. Pt also reports DFM starting this morning after breakfast. Reports low lying placenta-to have follow up scan July 26 for evaluation.  Pain score: 4-R mid uterus-throbbing-intermittent  Vitals:   10/24/21 0146  BP: 126/78  Pulse: 94  Resp: 17  Temp: 98.1 F (36.7 C)  SpO2: 95%     FHT:152

## 2021-10-24 NOTE — Progress Notes (Signed)
Cardio-Obstetrics Clinic  New Evaluation  Date:  10/24/2021   ID:  [name], DOB 1980/09/28, MRN [id number]  PCP:  Ralph Leyden, FNP   [facility] Providers Cardiologist:  None  Electrophysiologist:  None       Referring MD: Martinique, Peter M, MD   Chief Complaint: " I miss visit a lot of palpitations"  History of Present Illness:    [name] is a 41 y.o. female [[id number]] who is being seen today for the evaluation of palpitation at the request of Martinique, Peter M, MD.   Medical history includes gestational diabetes, hypertension with accelerated postpartum hypertension, obesity was referred to the cardio OB clinic by Dr. Peter Martinique.  She reports that over the last several weeks she has had intermittent palpitations which she described as abrupt onset of fast heartbeat.  She is concerned because every time she had these she gets really dizzy.  No other complaints at this time.  She denies any history of preeclampsia.  This is her 10th pregnancy, she has had 6 live birth and 2 miscarriages and is currently pregnant.  Prior CV Studies Reviewed: The following studies were reviewed today:  TTE 09/22/2021 IMPRESSIONS  1. Left ventricular ejection fraction, by estimation, is 60 to 65%. The left ventricle has normal function. The left ventricle has no regional  wall motion abnormalities. Left ventricular diastolic parameters were normal.   2. Right ventricular systolic function is normal. The right ventricular size is normal.   3. The mitral valve is normal in structure. Trivial mitral valve regurgitation. No evidence of mitral stenosis.   4. The aortic valve is normal in structure. Aortic valve regurgitation is not visualized. No aortic stenosis is present.   5. The inferior vena cava is normal in size with greater than 50% respiratory variability, suggesting right atrial pressure of 3 mmHg.   FINDINGS   Left Ventricle: Left ventricular ejection fraction, by  estimation, is 60 to 65%. The left ventricle has normal function. The left ventricle has no  regional wall motion abnormalities. The left ventricular internal cavity size was normal in size. There is   no left ventricular hypertrophy. Left ventricular diastolic parameters were normal.  Right Ventricle: The right ventricular size is normal. No increase in right ventricular wall thickness. Right ventricular systolic function is  normal.   Left Atrium: Left atrial size was normal in size.   Right Atrium: Right atrial size was normal in size.   Pericardium: There is no evidence of pericardial effusion.   Mitral Valve: The mitral valve is normal in structure. Trivial mitral valve regurgitation. No evidence of mitral valve stenosis.   Tricuspid Valve: The tricuspid valve is normal in structure. Tricuspid valve regurgitation is not demonstrated. No evidence of tricuspid  stenosis.   Aortic Valve: The aortic valve is normal in structure. Aortic valve regurgitation is not visualized. No aortic stenosis is present.   Pulmonic Valve: The pulmonic valve was normal in structure. Pulmonic valve regurgitation is not visualized. No evidence of pulmonic stenosis.   Aorta: The aortic root is normal in size and structure.   Venous: The inferior vena cava is normal in size with greater than 50% respiratory variability, suggesting right atrial pressure of 3 mmHg.   IAS/Shunts: No atrial level shunt detected by color flow Doppler.    Past Medical History:  Diagnosis Date   Anemia    Anxiety "fast hearbeat"   took Benadryl at end of pregnancy   Back pain affecting pregnancy in  first trimester 79/15/0569   Complication of anesthesia    Epidural 1 sided   Depression    During 1 pregnancy   Diabetes mellitus without complication ([place])    Endometriosis    Gallstones    Gestational diabetes    just started checking BS 03/05/12   Headache(784.0)    Hematuria 05/27/2015   History of kidney stones     Irregular periods    Pregnancy induced hypertension    Recurrent upper respiratory infection (URI)    Mostly when pregnant   Spotting affecting pregnancy in first trimester 01/27/2015    Past Surgical History:  Procedure Laterality Date   APPENDECTOMY Right 09/03/2021   CHOLECYSTECTOMY N/A 03/20/2019   Procedure: LAPAROSCOPIC CHOLECYSTECTOMY;  Surgeon: Aviva Signs, MD;  Location: [facility];  Service: General;  Laterality: N/A;   LAPAROSCOPIC APPENDECTOMY N/A 09/03/2021   Procedure: APPENDECTOMY LAPAROSCOPIC;  Surgeon: Ralene Ok, MD;  Location: [facility] OR;  Service: General;  Laterality: N/A;   MULTIPLE TOOTH EXTRACTIONS     WISDOM TOOTH EXTRACTION        OB History     Gravida  9   Para  6   Term  6   Preterm  0   AB  2   Living  6      SAB  2   IAB      Ectopic      Multiple  0   Live Births  6        Obstetric Comments  Dr. Nestor Lewandowsky Tree.              Current Medications: Current Meds  Medication Sig   Accu-Chek FastClix Lancets MISC Use as directed to check blood sugar 4 times daily   aspirin 81 MG EC tablet Take 2 tablets (162 mg total) by mouth daily. Swallow whole.   azelastine (ASTELIN) 0.1 % nasal spray Place 1 spray into both nostrils as needed. Use in each nostril as directed   Blood Glucose Monitoring Suppl (ACCU-CHEK GUIDE ME) w/Device KIT 1 each by Does not apply route 4 (four) times daily.   Blood Pressure Monitor MISC For regular home bp monitoring during pregnancy   glucose blood (ACCU-CHEK GUIDE) test strip Use as instructed to check blood sugar 4 times daily   labetalol (NORMODYNE) 200 MG tablet Take 1 tablet (200 mg total) by mouth 2 (two) times daily.   loratadine (CLARITIN) 10 MG tablet Take 10 mg by mouth at bedtime.   Pediatric Multivitamins-Iron (FLINTSTONES COMPLETE PO) Take 2 tablets by mouth.     Allergies:   Sulfamethoxazole-trimethoprim   Social History   Socioeconomic History   Marital status: Divorced     Spouse name: Not on file   Number of children: Not on file   Years of education: Not on file   Highest education level: Not on file  Occupational History   Not on file  Tobacco Use   Smoking status: Former    Packs/day: 0.10    Years: 0.50    Total pack years: 0.05    Types: Cigarettes    Start date: 02/19/1998    Quit date: 09/17/1998    Years since quitting: 23.1   Smokeless tobacco: Never  Vaping Use   Vaping Use: Never used  Substance and Sexual Activity   Alcohol use: Not Currently    Comment: not while pregnant   Drug use: No   Sexual activity: Yes    Partners: Male    Birth control/protection:  None  Other Topics Concern   Not on file  Social History Narrative   Lives in [place], [place]   Does not work outside home.   Married for over 3 years.    Wear seatbelt.   Eats all food groups.   Enjoys dance.    Social Determinants of Health   Financial Resource Strain: Low Risk  (01/19/2020)   Overall Financial Resource Strain (CARDIA)    Difficulty of Paying Living Expenses: Not very hard  Food Insecurity: No Food Insecurity (08/03/2021)   Hunger Vital Sign    Worried About Running Out of Food in the Last Year: Never true    Ran Out of Food in the Last Year: Never true  Transportation Needs: No Transportation Needs (08/03/2021)   PRAPARE - Hydrologist (Medical): No    Lack of Transportation (Non-Medical): No  Physical Activity: Insufficiently Active (08/03/2021)   Exercise Vital Sign    Days of Exercise per Week: 1 day    Minutes of Exercise per Session: 10 min  Stress: No Stress Concern Present (08/03/2021)   [place]    Feeling of Stress : Only a little  Social Connections: Socially Isolated (08/03/2021)   Social Connection and Isolation Panel [NHANES]    Frequency of Communication with Friends and Family: More than three times a week    Frequency of Social Gatherings  with Friends and Family: Three times a week    Attends Religious Services: Never    Active Member of Clubs or Organizations: No    Attends Archivist Meetings: Never    Marital Status: Divorced      Family History  Problem Relation Age of Onset   Hypertension Mother    Anxiety disorder Mother    Depression Mother    Hypertension Father    Anxiety disorder Sister    ADD / ADHD Daughter    Asthma Daughter    ADD / ADHD Daughter    Seizures Daughter    ADD / ADHD Daughter    ADD / ADHD Daughter    Heart disease Maternal Grandmother    Cancer Maternal Grandmother        brain   Heart failure Maternal Grandmother    Breast cancer Maternal Grandmother    Cancer Paternal Grandmother        brain       Review of Systems: Constitution: Negative for decreased appetite, fever and weight gain.  HENT: Negative for congestion, ear discharge, hoarse voice and sore throat.   Eyes: Negative for discharge, redness, vision loss in right eye and visual halos.  Cardiovascular: Report palpitations.  Negative for chest pain, dyspnea on exertion, leg swelling, orthopnea.  Respiratory: Negative for cough, hemoptysis, shortness of breath and snoring.   Endocrine: Negative for heat intolerance and polyphagia.  Hematologic/Lymphatic: Negative for bleeding problem. Does not bruise/bleed easily.  Skin: Negative for flushing, nail changes, rash and suspicious lesions.  Musculoskeletal: Negative for arthritis, joint pain, muscle cramps, myalgias, neck pain and stiffness.  Gastrointestinal: Negative for abdominal pain, bowel incontinence, diarrhea and excessive appetite.  Genitourinary: Negative for decreased libido, genital sores and incomplete emptying.  Neurological: Negative for brief paralysis, focal weakness, headaches and loss of balance.  Psychiatric/Behavioral: Negative for altered mental status, depression and suicidal ideas.  Allergic/Immunologic: Negative for HIV exposure and  persistent infections.     Labs/EKG Reviewed:    EKG:   EKG is was  not ordered today.   Recent Labs: 09/25/2021: ALT 15; BUN 7; Creatinine, Ser 0.50; Hemoglobin 11.5; Magnesium 1.9; Platelets 216; Potassium 4.4; Sodium 138   Recent Lipid Panel Lab Results  Component Value Date/Time   CHOL 188 09/30/2020 06:11 PM   TRIG 181 (H) 09/30/2020 06:11 PM   HDL 53 09/30/2020 06:11 PM   CHOLHDL 3.5 09/30/2020 06:11 PM   LDLCALC 99 09/30/2020 06:11 PM   LDLCALC 115 (H) 11/01/2017 10:51 AM    Physical Exam:    VS:  BP 106/66   Pulse 94   Ht '5\' 3"'  (1.6 m)   Wt 226 lb 9.6 oz (102.8 kg)   LMP 05/03/2021   SpO2 98%   BMI 40.14 kg/m     Wt Readings from Last 3 Encounters:  10/24/21 226 lb 9.6 oz (102.8 kg)  10/24/21 226 lb (102.5 kg)  10/04/21 225 lb 3.2 oz (102.2 kg)     GEN:  Well nourished, well developed in no acute distress HEENT: Normal NECK: No JVD; No carotid bruits LYMPHATICS: No lymphadenopathy CARDIAC: RRR, no murmurs, rubs, gallops RESPIRATORY:  Clear to auscultation without rales, wheezing or rhonchi  ABDOMEN: Soft, non-tender, non-distended MUSCULOSKELETAL:  No edema; No deformity  SKIN: Warm and dry NEUROLOGIC:  Alert and oriented x 3 PSYCHIATRIC:  Normal affect    Risk Assessment/Risk Calculators:     CARPREG II Risk Prediction Index Score:  1.  The patient's risk for a primary cardiac event is 5%.            ASSESSMENT & PLAN:     Palpitation with postural dizziness Chronic hypertension in pregnancy Gestational diabetes Obesity in pregnancy   Given her symptoms we will like to placed a monitor on the patient to make sure that arrhythmia is not playing a role here.  I reviewed her echocardiogram does not need to repeat this this was recently done and was reassuring.  We talked about her increased risk for preeclampsia giving her chronic hypertension, gestational diabetes and obesity She has been started on aspirin for preeclampsia  prophylaxis.  I discussed with the patient we have to be vigilant with taking her blood pressure daily to make sure that we keep her less than 140/90 during this pregnancy.  She does have a blood pressure cuff at home.  In terms of her weight gain we discussed plan to limit her weight gain between 11 to 20 pounds.  The goal is a vaginal delivery at [facility].   She will follow-up in 12 weeks.  Patient Instructions  Medication Instructions:  Your physician recommends that you continue on your current medications as directed. Please refer to the Current Medication list given to you today.  *If you need a refill on your cardiac medications before your next appointment, please call your pharmacy*   Lab Work: None If you have labs (blood work) drawn today and your tests are completely normal, you will receive your results only by: [place] (if you have MyChart) OR A paper copy in the mail If you have any lab test that is abnormal or we need to change your treatment, we will call you to review the results.   Testing/Procedures: [place]- Long Term Monitor Instructions  Your physician has requested you wear a ZIO patch monitor for 7 days.  This is a single patch monitor. [employer] supplies one patch monitor per enrollment. Additional stickers are not available. Please do not apply patch if you will be having a Nuclear Stress  Test,  Echocardiogram, Cardiac CT, MRI, or Chest Xray during the period you would be wearing the  monitor. The patch cannot be worn during these tests. You cannot remove and re-apply the  ZIO XT patch monitor.  Your ZIO patch monitor will be mailed 3 day USPS to your address on file. It may take 3-5 days  to receive your monitor after you have been enrolled.  Once you have received your monitor, please review the enclosed instructions. Your monitor  has already been registered assigning a specific monitor serial # to you.  Billing and Patient Assistance  Program Information  We have supplied [employer] with any of your insurance information on file for billing purposes. [employer] offers a sliding scale Patient Assistance Program for patients that do not have  insurance, or whose insurance does not completely cover the cost of the ZIO monitor.  You must apply for the Patient Assistance Program to qualify for this discounted rate.  To apply, please call [employer] at [phone], select option 4, select option 2, ask to apply for  Patient Assistance Program. Theodore Demark will ask your household income, and how many people  are in your household. They will quote your out-of-pocket cost based on that information.  [employer] will also be able to set up a 50-month interest-free payment plan if needed.  Applying the monitor   Shave hair from upper left chest.  Hold abrader disc by orange tab. Rub abrader in 40 strokes over the upper left chest as  indicated in your monitor instructions.  Clean area with 4 enclosed alcohol pads. Let dry.  Apply patch as indicated in monitor instructions. Patch will be placed under collarbone on left  side of chest with arrow pointing upward.  Rub patch adhesive wings for 2 minutes. Remove white label marked "1". Remove the white  label marked "2". Rub patch adhesive wings for 2 additional minutes.  While looking in a mirror, press and release button in center of patch. A small green light will  flash 3-4 times. This will be your only indicator that the monitor has been turned on.  Do not shower for the first 24 hours. You may shower after the first 24 hours.  Press the button if you feel a symptom. You will hear a small click. Record Date, Time and  Symptom in the Patient Logbook.  When you are ready to remove the patch, follow instructions on the last 2 pages of Patient  Logbook. Stick patch monitor onto the last page of Patient Logbook.  Place Patient Logbook in the blue and white box. Use locking tab on box and tape box  closed  securely. The blue and white box has prepaid postage on it. Please place it in the mailbox as  soon as possible. Your physician should have your test results approximately 7 days after the  monitor has been [facility]  Call IM[place]at [phone] you have questions regarding  your ZIO XT patch monitor. Call them immediately if you see an orange light blinking on your  monitor.  If your monitor falls off in less than 4 days, contact our Monitor department at [phone]  If your monitor becomes loose or falls off after 4 days call [employer] at [phone]  suggestions on securing your monitor    Follow-Up: [facility] and your health needs are our priority.  As part of our continuing mission to provide you with exceptional heart care, we have  created designated Provider Care Teams.  These Care Teams include your primary Cardiologist (physician) and Advanced Practice Providers (APPs -  Physician Assistants and Nurse Practitioners) who all work together to provide you with the care you need, when you need it.  We recommend signing up for the patient portal called "MyChart".  Sign up information is provided on this After Visit Summary.  MyChart is used to connect with patients for Virtual Visits (Telemedicine).  Patients are able to view lab/test results, encounter notes, upcoming appointments, etc.  Non-urgent messages can be sent to your provider as well.   To learn more about what you can do with MyChart, go to [email or web address].    Your next appointment:   12 week(s)  The format for your next appointment:   In Person  Provider:   Berniece Salines, DO    Other Instructions   Important Information About Sugar         Dispo:  Return in about 12 weeks (around 01/16/2022).   Medication Adjustments/Labs and Tests Ordered: Current medicines are reviewed at length with the patient today.  Concerns regarding  medicines are outlined above.  Tests Ordered: Orders Placed This Encounter  Procedures   LONG TERM MONITOR (3-14 DAYS)   Medication Changes: No orders of the defined types were placed in this encounter.

## 2021-10-26 LAB — CULTURE, OB URINE

## 2021-10-30 DIAGNOSIS — R42 Dizziness and giddiness: Secondary | ICD-10-CM

## 2021-10-30 DIAGNOSIS — R002 Palpitations: Secondary | ICD-10-CM

## 2021-10-30 DIAGNOSIS — R55 Syncope and collapse: Secondary | ICD-10-CM

## 2021-10-31 ENCOUNTER — Other Ambulatory Visit: Payer: Self-pay | Admitting: Obstetrics & Gynecology

## 2021-10-31 ENCOUNTER — Other Ambulatory Visit: Payer: Medicaid Other

## 2021-10-31 DIAGNOSIS — O4402 Placenta previa specified as without hemorrhage, second trimester: Secondary | ICD-10-CM

## 2021-11-01 ENCOUNTER — Ambulatory Visit (INDEPENDENT_AMBULATORY_CARE_PROVIDER_SITE_OTHER): Payer: Medicaid Other | Admitting: Obstetrics & Gynecology

## 2021-11-01 ENCOUNTER — Encounter: Payer: Self-pay | Admitting: Obstetrics & Gynecology

## 2021-11-01 ENCOUNTER — Ambulatory Visit (INDEPENDENT_AMBULATORY_CARE_PROVIDER_SITE_OTHER): Payer: Medicaid Other

## 2021-11-01 VITALS — BP 112/77 | HR 94 | Wt 225.2 lb

## 2021-11-01 DIAGNOSIS — O4402 Placenta previa specified as without hemorrhage, second trimester: Secondary | ICD-10-CM | POA: Diagnosis not present

## 2021-11-01 DIAGNOSIS — O10919 Unspecified pre-existing hypertension complicating pregnancy, unspecified trimester: Secondary | ICD-10-CM

## 2021-11-01 DIAGNOSIS — Z3A25 25 weeks gestation of pregnancy: Secondary | ICD-10-CM | POA: Diagnosis not present

## 2021-11-01 DIAGNOSIS — O09523 Supervision of elderly multigravida, third trimester: Secondary | ICD-10-CM

## 2021-11-01 DIAGNOSIS — O0992 Supervision of high risk pregnancy, unspecified, second trimester: Secondary | ICD-10-CM

## 2021-11-01 DIAGNOSIS — O24410 Gestational diabetes mellitus in pregnancy, diet controlled: Secondary | ICD-10-CM

## 2021-11-01 DIAGNOSIS — O4400 Placenta previa specified as without hemorrhage, unspecified trimester: Secondary | ICD-10-CM

## 2021-11-01 LAB — POCT URINALYSIS DIPSTICK OB
Blood, UA: NEGATIVE
Glucose, UA: NEGATIVE
Leukocytes, UA: NEGATIVE
Nitrite, UA: NEGATIVE
POC,PROTEIN,UA: NEGATIVE

## 2021-11-01 NOTE — Progress Notes (Signed)
[place] TA/TV 25 wks,breech,anterior placenta previa gr 1,cx 2.8 cm,SVP of fluid 7.1 cm,normal ovaries,fhr 154 bpm,right renal pelvic dilatation 6.5 mm,LK 4.1 mm WNL,EFW 678 g 15%,AC 17%

## 2021-11-01 NOTE — Progress Notes (Signed)
HIGH-RISK PREGNANCY VISIT Patient name: [name] MRN [id number]  Date of birth: 1980-06-02 Chief Complaint:   Routine Prenatal Visit  History of Present Illness:   [name] is a 41 y.o. [id number] female at 91w0dwith an Estimated Date of Delivery: 02/14/22 being seen today for ongoing management of a high-risk pregnancy complicated by:  -cHTN- on Labetalol 2033mbid -Anxiety - no meds -A neg GDMA1- well controlled, sugars reviewed Placenta previa  Today she reports no complaints.   Contractions: Not present. Vag. Bleeding: None.  Movement: Present. denies leaking of fluid.      08/03/2021   10:21 AM 01/19/2020   10:54 AM 12/31/2017    8:59 AM 11/01/2017   10:06 AM 09/19/2017    8:38 AM  Depression screen PHQ 2/9  Decreased Interest 0 0 0 0 0  Down, Depressed, Hopeless 1 0 0 0 1  PHQ - 2 Score 1 0 0 0 1  Altered sleeping 0 0 0 0 0  Tired, decreased energy 1 0 1 0 0  Change in appetite 0 0     Feeling bad or failure about yourself  0 1 0 1 0  Trouble concentrating 0 0 0 0 0  Moving slowly or fidgety/restless 0 0     Suicidal thoughts 0 0 0 0 0  PHQ-9 Score '2 1 1 1 1     ' Current Outpatient Medications  Medication Instructions   Accu-Chek FastClix Lancets MISC Use as directed to check blood sugar 4 times daily   aspirin EC 162 mg, Oral, Daily, Swallow whole.   azelastine (ASTELIN) 0.1 % nasal spray 1 spray, Each Nare, As needed, Use in each nostril as directed   Blood Glucose Monitoring Suppl (ACCU-CHEK GUIDE ME) w/Device KIT 1 each, Does not apply, 4 times daily   Blood Pressure Monitor MISC For regular home bp monitoring during pregnancy   citalopram (CELEXA) 40 mg, Daily   Doxylamine-Pyridoxine (DICLEGIS) 10-10 MG TBEC 2 tabs q hs, if sx persist add 1 tab q am on day 3, if sx persist add 1 tab q afternoon on day 4   glucose blood (ACCU-CHEK GUIDE) test strip Use as instructed to check blood sugar 4 times daily   labetalol (NORMODYNE) 200 mg, Oral, 2 times  daily   loratadine (CLARITIN) 10 mg, Oral, Daily at bedtime   Pediatric Multivitamins-Iron (FLINTSTONES COMPLETE PO) 2 tablets, Oral     Review of Systems:   Pertinent items are noted in HPI Denies abnormal vaginal discharge w/ itching/odor/irritation, headaches, visual changes, shortness of breath, chest pain, abdominal pain, severe nausea/vomiting, or problems with urination or bowel movements unless otherwise stated above. Pertinent History Reviewed:  Reviewed past medical,surgical, social, obstetrical and family history.  Reviewed problem list, medications and allergies. Physical Assessment:   Vitals:   11/01/21 1557  BP: 112/77  Pulse: 94  Weight: 225 lb 3.2 oz (102.2 kg)  Body mass index is 39.89 kg/m.           Physical Examination:   General appearance: alert, well appearing, and in no distress  Mental status: normal mood, behavior, speech, dress, motor activity, and thought processes  Skin: warm & dry   Extremities: Edema: None    Cardiovascular: normal heart rate noted  Respiratory: normal respiratory effort, no distress  Abdomen: gravid, soft, non-tender  Pelvic: Cervical exam deferred         Fetal Status:     Movement: Present    Fetal Surveillance Testing today: breech,anterior  placenta previa gr 1,cx 2.8 cm,SVP of fluid 7.1 cm,normal ovaries,fhr 154 bpm,right renal pelvic dilatation 6.5 mm,LK 4.1 mm WNL,EFW 678 g 15%,AC 17%   Chaperone: N/A    Results for orders placed or performed in visit on 11/01/21 (from the past 24 hour(s))  POC Urinalysis Dipstick OB   Collection Time: 11/01/21  4:01 PM  Result Value Ref Range   Color, UA     Clarity, UA     Glucose, UA Negative Negative   Bilirubin, UA     Ketones, UA small    Spec Grav, UA     Blood, UA neg    pH, UA     POC,PROTEIN,UA Negative Negative, Trace, Small (1+), Moderate (2+), Large (3+), 4+   Urobilinogen, UA     Nitrite, UA neg    Leukocytes, UA Negative Negative   Appearance     Odor        Assessment & Plan:  High-risk pregnancy: [id number] at 71w0dwith an Estimated Date of Delivery: 02/14/22   1) Placenta previa -reviewed today's [place] will continue to follow -should previa remain, plan on delivery via C-section  2) GDMA1- well controlled 3) Chronic HTN -continue current meds, remains asymptomatic -growth q 4wks, low/normal EFW, will continue to monitor -antepartum testing @ 32wk -discussed early delivery 37-39wks pending BP management  4) Contraception -desires tubal sterilization -risk/benefit and alternatives reviewed including but not limited to risk of bleeding, infection and injury to surrounding organs -questions/concerns were addressed, inform consent obtained  Aneg  Meds: No orders of the defined types were placed in this encounter.   Labs/procedures today: growth scan  Treatment Plan:  as outlined above  Reviewed: Preterm labor symptoms and general obstetric precautions including but not limited to vaginal bleeding, contractions, leaking of fluid and fetal movement were reviewed in detail with the patient.  All questions were answered. Pt has home bp cuff. Check bp weekly, let u[place]oreaknow if >140/90.   Follow-up: Return in about 4 weeks (around 11/29/2021) for HRutledgevisit, labs, growth scan (continue every 4 wk scan).   Future Appointments  Date Time Provider DBingen 11/14/2021  3:15 PM [facility] 11/29/2021  2:15 PM [place]- FTOBGYN [place]CWH-FTIMG None  11/29/2021  3:10 PM OJanyth Pupa DO [facility]-FT FTOBGYN  12/28/2021 10:00 AM [facility] - FTOBGYN UKoreaCWH-FTIMG None  12/28/2021 10:50 AM EFlorian Buff MD [facility]-FT FTOBGYN  01/15/2022 11:00 AM TBerniece Salines DO [facility]  01/25/2022 10:00 AM [facility] - FTOBGYN UKoreaCWH-FTIMG None  01/25/2022 10:50 AM [place], LMertie Clause MD [facility]-FT FTOBGYN    Orders Placed This Encounter  Procedures   POC Urinalysis Dipstick OB    JJanyth Pupa DO Attending [facility] for WDe[employer] [facility] CBrook[place]

## 2021-11-03 ENCOUNTER — Encounter (HOSPITAL_COMMUNITY): Payer: Self-pay | Admitting: Obstetrics and Gynecology

## 2021-11-03 ENCOUNTER — Inpatient Hospital Stay (HOSPITAL_COMMUNITY)
Admission: AD | Admit: 2021-11-03 | Discharge: 2021-11-06 | DRG: 832 | Disposition: A | Discharge status: Home / Self Care | Payer: Medicaid Other | Attending: Obstetrics and Gynecology | Admitting: Obstetrics and Gynecology

## 2021-11-03 ENCOUNTER — Other Ambulatory Visit: Payer: Self-pay

## 2021-11-03 DIAGNOSIS — O10012 Pre-existing essential hypertension complicating pregnancy, second trimester: Secondary | ICD-10-CM | POA: Diagnosis present

## 2021-11-03 DIAGNOSIS — O4690 Antepartum hemorrhage, unspecified, unspecified trimester: Secondary | ICD-10-CM

## 2021-11-03 DIAGNOSIS — Z87891 Personal history of nicotine dependence: Secondary | ICD-10-CM

## 2021-11-03 DIAGNOSIS — Z3A25 25 weeks gestation of pregnancy: Secondary | ICD-10-CM

## 2021-11-03 DIAGNOSIS — R1030 Lower abdominal pain, unspecified: Secondary | ICD-10-CM | POA: Diagnosis not present

## 2021-11-03 DIAGNOSIS — Z7982 Long term (current) use of aspirin: Secondary | ICD-10-CM

## 2021-11-03 DIAGNOSIS — Z6791 Unspecified blood type, Rh negative: Secondary | ICD-10-CM | POA: Diagnosis not present

## 2021-11-03 DIAGNOSIS — O24410 Gestational diabetes mellitus in pregnancy, diet controlled: Secondary | ICD-10-CM | POA: Diagnosis not present

## 2021-11-03 DIAGNOSIS — O4412 Placenta previa with hemorrhage, second trimester: Secondary | ICD-10-CM | POA: Diagnosis not present

## 2021-11-03 DIAGNOSIS — O24414 Gestational diabetes mellitus in pregnancy, insulin controlled: Secondary | ICD-10-CM | POA: Diagnosis not present

## 2021-11-03 DIAGNOSIS — O0942 Supervision of pregnancy with grand multiparity, second trimester: Secondary | ICD-10-CM | POA: Diagnosis not present

## 2021-11-03 DIAGNOSIS — O358XX0 Maternal care for other (suspected) fetal abnormality and damage, not applicable or unspecified: Secondary | ICD-10-CM | POA: Diagnosis not present

## 2021-11-03 DIAGNOSIS — O26892 Other specified pregnancy related conditions, second trimester: Secondary | ICD-10-CM | POA: Diagnosis not present

## 2021-11-03 DIAGNOSIS — O35EXX0 Maternal care for other (suspected) fetal abnormality and damage, fetal genitourinary anomalies, not applicable or unspecified: Secondary | ICD-10-CM

## 2021-11-03 DIAGNOSIS — R1084 Generalized abdominal pain: Secondary | ICD-10-CM | POA: Diagnosis not present

## 2021-11-03 DIAGNOSIS — R52 Pain, unspecified: Secondary | ICD-10-CM | POA: Diagnosis not present

## 2021-11-03 LAB — URINALYSIS, ROUTINE W REFLEX MICROSCOPIC
Bilirubin Urine: NEGATIVE
Glucose, UA: NEGATIVE mg/dL
Ketones, ur: NEGATIVE mg/dL
Leukocytes,Ua: NEGATIVE
Nitrite: NEGATIVE
Protein, ur: NEGATIVE mg/dL
Specific Gravity, Urine: 1.009 (ref 1.005–1.030)
pH: 8 (ref 5.0–8.0)

## 2021-11-03 LAB — CBC
HCT: 32.4 % — ABNORMAL LOW (ref 36.0–46.0)
Hemoglobin: 11 g/dL — ABNORMAL LOW (ref 12.0–15.0)
MCH: 29.9 pg (ref 26.0–34.0)
MCHC: 34 g/dL (ref 30.0–36.0)
MCV: 88 fL (ref 80.0–100.0)
Platelets: 255 10*3/uL (ref 150–400)
RBC: 3.68 MIL/uL — ABNORMAL LOW (ref 3.87–5.11)
RDW: 15.1 % (ref 11.5–15.5)
WBC: 7.5 10*3/uL (ref 4.0–10.5)
nRBC: 0 % (ref 0.0–0.2)

## 2021-11-03 LAB — WET PREP, GENITAL
Clue Cells Wet Prep HPF POC: NONE SEEN
Sperm: NONE SEEN
Trich, Wet Prep: NONE SEEN
WBC, Wet Prep HPF POC: <10 (ref <10)
Yeast Wet Prep HPF POC: NONE SEEN

## 2021-11-03 LAB — KLEIHAUER-BETKE STAIN
# Vials RhIg: 1
Fetal Cells %: 0 %
Quantitation Fetal Hemoglobin: 0 mL

## 2021-11-03 LAB — TYPE AND SCREEN
ABO/RH(D): A NEG
Antibody Screen: NEGATIVE

## 2021-11-03 LAB — GLUCOSE, CAPILLARY
Glucose-Capillary: 141 mg/dL — ABNORMAL HIGH (ref 70–99)
Glucose-Capillary: 150 mg/dL — ABNORMAL HIGH (ref 70–99)

## 2021-11-03 MED ORDER — MAGNESIUM SULFATE BOLUS VIA INFUSION
4.0000 g | Freq: Once | INTRAVENOUS | Status: AC
  Administered 2021-11-03: 4 g via INTRAVENOUS
  Filled 2021-11-03: qty 1000

## 2021-11-03 MED ORDER — PRENATAL MULTIVITAMIN CH
1.0000 | ORAL_TABLET | Freq: Every day | ORAL | Status: DC
Start: 2021-11-03 — End: 2021-11-06
  Administered 2021-11-04 – 2021-11-06 (×3): 1 via ORAL
  Filled 2021-11-03 (×3): qty 1

## 2021-11-03 MED ORDER — LABETALOL HCL 100 MG PO TABS
200.0000 mg | ORAL_TABLET | Freq: Two times a day (BID) | ORAL | Status: DC
  Filled 2021-11-03: qty 2

## 2021-11-03 MED ORDER — ACETAMINOPHEN 325 MG PO TABS: 650.0000 mg | ORAL_TABLET | ORAL | Status: DC | PRN

## 2021-11-03 MED ORDER — LABETALOL HCL 200 MG PO TABS
200.0000 mg | ORAL_TABLET | Freq: Two times a day (BID) | ORAL | Status: DC
  Administered 2021-11-03 – 2021-11-04 (×2): 200 mg via ORAL
  Filled 2021-11-03 (×4): qty 1

## 2021-11-03 MED ORDER — LACTATED RINGERS IV SOLN: INTRAVENOUS | Status: DC

## 2021-11-03 MED ORDER — RHO D IMMUNE GLOBULIN 1500 UNIT/2ML IJ SOSY
300.0000 ug | PREFILLED_SYRINGE | Freq: Once | INTRAMUSCULAR | Status: DC
  Filled 2021-11-03: qty 2

## 2021-11-03 MED ORDER — BETAMETHASONE SOD PHOS & ACET 6 (3-3) MG/ML IJ SUSP
12.0000 mg | Freq: Once | INTRAMUSCULAR | Status: AC
  Administered 2021-11-04: 12 mg via INTRAMUSCULAR

## 2021-11-03 MED ORDER — DOCUSATE SODIUM 100 MG PO CAPS: 100.0000 mg | ORAL_CAPSULE | Freq: Two times a day (BID) | ORAL | Status: DC | PRN

## 2021-11-03 MED ORDER — ZOLPIDEM TARTRATE 5 MG PO TABS: 5.0000 mg | ORAL_TABLET | Freq: Every evening | ORAL | Status: DC | PRN

## 2021-11-03 MED ORDER — LACTATED RINGERS IV SOLN: INTRAVENOUS | Status: AC

## 2021-11-03 MED ORDER — CALCIUM CARBONATE ANTACID 500 MG PO CHEW: 2.0000 | CHEWABLE_TABLET | ORAL | Status: DC | PRN

## 2021-11-03 MED ORDER — RHO D IMMUNE GLOBULIN 1500 UNIT/2ML IJ SOSY
300.0000 ug | PREFILLED_SYRINGE | Freq: Once | INTRAMUSCULAR | Status: AC
  Administered 2021-11-03: 300 ug via INTRAVENOUS
  Filled 2021-11-03: qty 2

## 2021-11-03 MED ORDER — MAGNESIUM SULFATE 40 GM/1000ML IV SOLN
2.0000 g/h | INTRAVENOUS | Status: AC
  Administered 2021-11-04: 2 g/h via INTRAVENOUS
  Filled 2021-11-03 (×2): qty 1000

## 2021-11-03 MED ORDER — BETAMETHASONE SOD PHOS & ACET 6 (3-3) MG/ML IJ SUSP
12.0000 mg | Freq: Once | INTRAMUSCULAR | Status: AC
  Administered 2021-11-03: 12 mg via INTRAMUSCULAR
  Filled 2021-11-03: qty 5

## 2021-11-03 NOTE — Plan of Care (Signed)
°  Problem: Education: °Goal: Knowledge of disease or condition will improve °Outcome: Progressing °Goal: Knowledge of the prescribed therapeutic regimen will improve °Outcome: Progressing °Goal: Individualized Educational Video(s) °Outcome: Progressing °  °

## 2021-11-03 NOTE — MAU Note (Signed)
.  [name] is a 41 y.o. at [redacted]w[redacted]d here in MAU reporting: Pt reports that her stomach and back were hurting at 1200 and she took her daughter to the store when she got back she went to the bathroom because she felt something running out. Pt reports she saw blood and blood clots.   Onset of complaint: today at 12 Pain score: 5/10 cramping  There were no vitals filed for this visit.    Lab orders placed from triage: ua

## 2021-11-03 NOTE — H&P (Signed)
OBSTETRIC ADMISSION HISTORY AND PHYSICAL  [name] is a 41 y.o. female [phone] with IUP at 43w2dby early ultrasound presenting for vaginal bleeding. Patient has a known placenta previa. This is her first vaginal bleeding. Bleeding started around 1215 today. She reports occasional cramping. Endorses active fetal movement. [facility]  Dating: By 6 week ultrasound --->  Estimated Date of Delivery: 02/14/22  Sono:    _0 , CWD, normal anatomy, breech presentation, anterior previa, 360g, 23% EFW  _1 , CWD, normal anatomy, breech presentation, anterior previa, 678g, 15% EFW  Prenatal History/Complications:  - cHTN - Placenta previa - A1GDM - Rh negative - Anxiety/depression - AMA - Grand multiparity  Past Medical History: Past Medical History:  Diagnosis Date   Anemia    Anxiety "fast hearbeat"   took Benadryl at end of pregnancy   Back pain affecting pregnancy in first trimester 05/27/2015   Calculus of gallbladder without cholecystitis without obstruction 05/31/2019   Chlamydia 08/03/2021   06/21/21 @ [facility]  Only took 3 doxycycline (d/t n/v)   POC 032/67/12 neg   Complication of anesthesia    Epidural 1 sided   Depression    During 1 pregnancy   Diabetes [place] complication [place]    Endometriosis    Gallstones    Gestational diabetes    just started checking BS 03/05/12   Headache(784.0)    Hematuria 05/27/2015   History of gestational diabetes in prior pregnancy, currently pregnant 06/21/2015   History of kidney stones    Irregular periods    Pregnancy induced hypertension    Recurrent upper respiratory infection (URI)    Mostly when pregnant   Spotting affecting pregnancy in first trimester 01/27/2015    Past Surgical History: Past Surgical History:  Procedure Laterality Date   APPENDECTOMY Right 09/03/2021   CHOLECYSTECTOMY N/A 03/20/2019   Procedure: LAPAROSCOPIC CHOLECYSTECTOMY;  Surgeon: JAviva Signs MD;   Location: [facility];  Service: General;  Laterality: N/A;   LAPAROSCOPIC APPENDECTOMY N/A 09/03/2021   Procedure: APPENDECTOMY LAPAROSCOPIC;  Surgeon: RRalene Ok MD;  [facility];  Service: General;  Laterality: N/A;   MULTIPLE TOOTH EXTRACTIONS     WISDOM TOOTH EXTRACTION      Obstetrical History: OB History     Gravida  9   Para  6   Term  6   Preterm  0   AB  2   Living  6      SAB  2   IAB      Ectopic      Multiple  0   Live Births  6        [facility]st[facility]          Social History Social History   Socioeconomic History   Marital status: Divorced    Spouse name: Not on file   Number of children: Not on file   Years of education: Not on file   Highest education level: Not on file  Occupational History   Not on file  Tobacco Use   Smoking status: Former    Packs/day: 0.10    Years: 0.50    Total pack years: 0.05    Types: Cigarettes    Start date: 02/19/1998    Quit date: 09/17/1998    Years since quitting: 23.1   Smokeless tobacco: Never  Vaping Use   Vaping Use: Never used  Substance and Sexual Activity   Alcohol use: Not Currently  Comment: not while pregnant   Drug use: No   Sexual activity: Not Currently    Partners: Male    Birth control/protection: None  Other Topics Concern   Not on file  Social History Narrative   Lives in [place], [place]   Does not work outside home.   Married for over 3 years.    Wear seatbelt.   Eats all food groups.   Enjoys dance.    Social Determinants of Health   Financial Resource Strain: Low Risk  (01/19/2020)   Overall Financial Resource Strain (CARDIA)    Difficulty of Paying Living Expenses: Not very hard  Food Insecurity: No Food Insecurity (08/03/2021)   Hunger Vital Sign    Worried About Running Out of Food in the Last Year: Never true    Ran Out of Food in the Last Year: Never true  Transportation Needs: No Transportation Needs (08/03/2021)    PRAPARE - Hydrologist (Medical): No    Lack of Transportation (Non-Medical): No  Physical Activity: Insufficiently Active (08/03/2021)   Exercise Vital Sign    Days of Exercise per Week: 1 day    Minutes of Exercise per Session: 10 min  Stress: No Stress Concern Present (08/03/2021)   [place]    Feeling of Stress : Only a little  Social Connections: Socially Isolated (08/03/2021)   Social Connection and Isolation Panel [NHANES]    Frequency of Communication with Friends and Family: More than three times a week    Frequency of Social Gatherings with Friends and Family: Three times a week    Attends Religious Services: Never    Active Member of Clubs or Organizations: No    Attends Archivist Meetings: Never    Marital Status: Divorced    Family History: Family History  Problem Relation Age of Onset   Hypertension Mother    Anxiety disorder Mother    Depression Mother    Hypertension Father    Anxiety disorder Sister    ADD / ADHD Daughter    Asthma Daughter    ADD / ADHD Daughter    Seizures Daughter    ADD / ADHD Daughter    ADD / ADHD Daughter    Heart disease Maternal Grandmother    Cancer Maternal Grandmother        brain   Heart failure Maternal Grandmother    Breast cancer Maternal Grandmother    Cancer Paternal Grandmother        brain    Allergies: Allergies  Allergen Reactions   Sulfamethoxazole-Trimethoprim Swelling and Rash    Medications Prior to Admission  Medication Sig Dispense Refill Last Dose   aspirin 81 MG EC tablet Take 2 tablets (162 mg total) by mouth daily. Swallow whole. 180 tablet 2 11/02/2021   Blood Glucose Monitoring Suppl (ACCU-CHEK GUIDE ME) w/Device KIT 1 each by Does not apply route 4 (four) times daily. 1 kit 0 11/03/2021   labetalol (NORMODYNE) 200 MG tablet Take 1 tablet (200 mg total) by mouth 2 (two) times daily. 60 tablet 3  11/02/2021   Accu-Chek FastClix Lancets MISC Use as directed to check blood sugar 4 times daily 100 each 12    azelastine (ASTELIN) 0.1 % nasal spray Place 1 spray into both nostrils as needed. Use in each nostril as directed      Blood Pressure Monitor MISC For regular home bp monitoring during pregnancy 1 each 0  citalopram (CELEXA) 40 MG tablet Take 40 mg by mouth daily. (Patient not taking: Reported on 11/01/2021)      Doxylamine-Pyridoxine (DICLEGIS) 10-10 MG TBEC 2 tabs q hs, if sx persist add 1 tab q am on day 3, if sx persist add 1 tab q afternoon on day 4 (Patient not taking: Reported on 10/04/2021) 100 tablet 6    glucose blood (ACCU-CHEK GUIDE) test strip Use as instructed to check blood sugar 4 times daily 50 each 12    loratadine (CLARITIN) 10 MG tablet Take 10 mg by mouth at bedtime.      Pediatric Multivitamins-Iron (FLINTSTONES COMPLETE PO) Take 2 tablets by mouth.      Review of Systems   All systems reviewed and negative except as stated in HPI  Blood pressure (!) 145/82, pulse 95, temperature 98.8 F (37.1 C), resp. rate 16, last menstrual period 05/03/2021, SpO2 97 %. General appearance: alert and no distress Lungs: effort normal Heart: regular rate Abdomen: soft, non-tender, gravid Pelvic: Normal external female genitalia, small dark red clot at introitus, vaginal walls pink with rugae, small amount of dark red blood, cervix visually closed without lesion/masses, no active bleeding through the os  Extremities: no sign of DVT Presentation: breech Fetal monitoring : 145 bpm, moderate variability, +10x10 accels, no decels Uterine activity: quiet   Prenatal labs: ABO, Rh: --/--/PENDING (07/28 1409) Antibody: PENDING (07/28 1409) Rubella: 3.81 (04/27 1146) RPR: Non Reactive (04/27 1146)  HBsAg: Negative (04/27 1146)  HIV: Non Reactive (04/27 1146)  GBS:   unknown 1 hr Glucola: n/a Genetic screening: normal Anatomy US: normal  Prenatal Transfer Tool  Maternal  Diabetes: Yes:  Diabetes Type:  Diet controlled Genetic Screening: Normal Maternal Ultrasounds/Referrals: Normal Fetal Ultrasounds or other Referrals:  None Maternal Substance Abuse:  No Significant Maternal Medications:  None Significant Maternal Lab Results: Rh negative  Results for orders placed or performed during the hospital encounter of 11/03/21 (from the past 24 hour(s))  Urinalysis, Routine w reflex microscopic Urine, Clean Catch   Collection Time: 11/03/21  1:30 PM  Result Value Ref Range   Color, Urine YELLOW YELLOW   APPearance CLOUDY (A) CLEAR   Specific Gravity, Urine 1.009 1.005 - 1.030   pH 8.0 5.0 - 8.0   Glucose, UA NEGATIVE NEGATIVE mg/dL   Hgb urine dipstick LARGE (A) NEGATIVE   Bilirubin Urine NEGATIVE NEGATIVE   Ketones, ur NEGATIVE NEGATIVE mg/dL   Protein, ur NEGATIVE NEGATIVE mg/dL   Nitrite NEGATIVE NEGATIVE   Leukocytes,Ua NEGATIVE NEGATIVE   RBC / HPF 6-10 0 - 5 RBC/hpf   WBC, UA 0-5 0 - 5 WBC/hpf   Bacteria, UA RARE (A) NONE SEEN   Squamous Epithelial / LPF 0-5 0 - 5   Mucus PRESENT    Non Squamous Epithelial 0-5 (A) NONE SEEN  Type and screen Dustin   Collection Time: 11/03/21  2:09 PM  Result Value Ref Range   ABO/RH(D) PENDING    Antibody Screen PENDING    Sample Expiration      11/06/2021,2359 Performed at [facility], [address], [place], [place] [address]   Wet prep, genital   Collection Time: 11/03/21  2:14 PM   Specimen: Vaginal  Result Value Ref Range   Yeast Wet Prep HPF POC NONE SEEN NONE SEEN   Trich, Wet Prep NONE SEEN NONE SEEN   Clue Cells Wet Prep HPF POC NONE SEEN NONE SEEN   WBC, Wet Prep HPF POC <10 <  10   Sperm NONE SEEN     Patient Active Problem List   Diagnosis Date Noted   Vaginal bleeding during pregnancy 11/03/2021   Right fetal pelviectasis 11/03/2021   Postural dizziness with presyncope 10/24/2021   Palpitations 10/24/2021   Placenta previa, second trimester 09/04/2021    Gestational diabetes 08/14/2021   Pre-diabetes 08/04/2021   Chronic hypertension affecting pregnancy 08/03/2021   Supervision of high-risk pregnancy 08/02/2021   MDD (major depressive disorder), recurrent severe, without psychosis ([place]) 09/30/2020   Panic attacks 09/30/2020   Social anxiety disorder 09/30/2020   Adjustment disorder with mixed anxiety and depressed mood 09/30/2020   Abnormal ECG 08/08/2015   Poor dentition 07/19/2015   AMA (advanced maternal age) multigravida 35+ 06/21/2015   Grand multipara 06/21/2015   Rh negative state in antepartum period 06/21/2015   Anxiety 03/05/2012    Assessment/Plan:  [name] is a 42 y.o. [id number] at 53w2dhere for vaginal bleeding in the setting of known placenta previa  - Admit to [facility], Rhogam - Order placed by Dr. PNestor Lewandowsky CNM  11/03/2021, 2:42 PM

## 2021-11-03 NOTE — MAU Provider Note (Signed)
History     CSN: [id number]  Arrival date and time: 11/03/21 1315   None     Chief Complaint  Patient presents with   Vaginal Bleeding   HPI [name] is a 41 y.o. [id number] at [redacted]w[redacted]d who presents to MAU via EMS with reports of vaginal bleeding. Patient reports around 1215 she took her daughter to the store and when she returned she felt as if she had peed on herself. When she went to the bathroom she saw a lot of bright red blood and clots. Since her arrival she has noticed a small amount of pink-tinged blood. She denies recent intercourse or having anything in her vagina. Reports that she feels occasional cramping on right side and back but nothing consistent. She endorses active fetal movement.   Patient receives prenatal care at [facility]. Pregnancy has been complicated by known placenta previa, A1GDM and cHTN in which she takes Labetalol.    OB History     Gravida  9   Para  6   Term  6   Preterm  0   AB  2   Living  6      SAB  2   IAB      Ectopic      Multiple  0   Live Births  6        Obstetric Comments  Dr. Ferguson/[facility].          Past Medical History:  Diagnosis Date   Anemia    Anxiety "fast hearbeat"   took Benadryl at end of pregnancy   Back pain affecting pregnancy in first trimester 05/27/2015   Calculus of gallbladder without cholecystitis without obstruction 05/31/2019   Chlamydia 08/03/2021   06/21/21 @ [facility]  Only took 3 doxycycline (d/t n/v)   POC 08/03/21: neg   Complication of anesthesia    Epidural 1 sided   Depression    During 1 pregnancy   Diabetes mellitus without complication (HCC)    Endometriosis    Gallstones    Gestational diabetes    just started checking BS 03/05/12   Headache(784.0)    Hematuria 05/27/2015   History of gestational diabetes in prior pregnancy, currently pregnant 06/21/2015   History of kidney stones    Irregular periods    Pregnancy induced hypertension    Recurrent upper  respiratory infection (URI)    Mostly when pregnant   Spotting affecting pregnancy in first trimester 01/27/2015    Past Surgical History:  Procedure Laterality Date   APPENDECTOMY Right 09/03/2021   CHOLECYSTECTOMY N/A 03/20/2019   Procedure: LAPAROSCOPIC CHOLECYSTECTOMY;  Surgeon: Jenkins, Mark, MD;  Location: [facility];  Service: General;  Laterality: N/A;   LAPAROSCOPIC APPENDECTOMY N/A 09/03/2021   Procedure: APPENDECTOMY LAPAROSCOPIC;  Surgeon: Ramirez, Armando, MD;  Location: [facility];  Service: General;  Laterality: N/A;   MULTIPLE TOOTH EXTRACTIONS     WISDOM TOOTH EXTRACTION      Family History  Problem Relation Age of Onset   Hypertension Mother    Anxiety disorder Mother    Depression Mother    Hypertension Father    Anxiety disorder Sister    ADD / ADHD Daughter    Asthma Daughter    ADD / ADHD Daughter    Seizures Daughter    ADD / ADHD Daughter    ADD / ADHD Daughter    Heart disease Maternal Grandmother    Cancer Maternal Grandmother        brain     Heart failure Maternal Grandmother    Breast cancer Maternal Grandmother    Cancer Paternal Grandmother        brain    Social History   Tobacco Use   Smoking status: Former    Packs/day: 0.10    Years: 0.50    Total pack years: 0.05    Types: Cigarettes    Start date: 02/19/1998    Quit date: 09/17/1998    Years since quitting: 23.1   Smokeless tobacco: Never  Vaping Use   Vaping Use: Never used  Substance Use Topics   Alcohol use: Not Currently    Comment: not while pregnant   Drug use: No    Allergies:  Allergies  Allergen Reactions   Sulfamethoxazole-Trimethoprim Swelling and Rash    Medications Prior to Admission  Medication Sig Dispense Refill Last Dose   aspirin 81 MG EC tablet Take 2 tablets (162 mg total) by mouth daily. Swallow whole. 180 tablet 2 11/02/2021   Blood Glucose Monitoring Suppl (ACCU-CHEK GUIDE ME) w/Device KIT 1 each by Does not apply route 4 (four) times daily. 1 kit 0  11/03/2021   labetalol (NORMODYNE) 200 MG tablet Take 1 tablet (200 mg total) by mouth 2 (two) times daily. 60 tablet 3 11/02/2021   Accu-Chek FastClix Lancets MISC Use as directed to check blood sugar 4 times daily 100 each 12    azelastine (ASTELIN) 0.1 % nasal spray Place 1 spray into both nostrils as needed. Use in each nostril as directed      Blood Pressure Monitor MISC For regular home bp monitoring during pregnancy 1 each 0    citalopram (CELEXA) 40 MG tablet Take 40 mg by mouth daily. (Patient not taking: Reported on 11/01/2021)      Doxylamine-Pyridoxine (DICLEGIS) 10-10 MG TBEC 2 tabs q hs, if sx persist add 1 tab q am on day 3, if sx persist add 1 tab q afternoon on day 4 (Patient not taking: Reported on 10/04/2021) 100 tablet 6    glucose blood (ACCU-CHEK GUIDE) test strip Use as instructed to check blood sugar 4 times daily 50 each 12    loratadine (CLARITIN) 10 MG tablet Take 10 mg by mouth at bedtime.      Pediatric Multivitamins-Iron (FLINTSTONES COMPLETE PO) Take 2 tablets by mouth.      Review of Systems  Constitutional: Negative.   Respiratory: Negative.    Cardiovascular: Negative.   Gastrointestinal:  Positive for abdominal pain (cramping).  Genitourinary:  Positive for vaginal bleeding.  Musculoskeletal: Negative.   Neurological: Negative.    Physical Exam   Blood pressure (!) 145/82, pulse 95, temperature 98.8 F (37.1 C), resp. rate 16, last menstrual period 05/03/2021, SpO2 97 %.  Physical Exam Vitals and nursing note reviewed. Exam conducted with a chaperone present.  Constitutional:      General: She is not in acute distress. Eyes:     Extraocular Movements: Extraocular movements intact.     Pupils: Pupils are equal, round, and reactive to light.  Cardiovascular:     Rate and Rhythm: Normal rate.  Pulmonary:     Effort: Pulmonary effort is normal.  Abdominal:     Palpations: Abdomen is soft.     Tenderness: There is no abdominal tenderness.     Comments:  Gravid   Genitourinary:    Comments: Normal external female genitalia, small dark red clot at introitus, vaginal walls pink with rugae, small amount of dark red blood, cervix visually closed without lesion/masses, no   active bleeding through the os  Musculoskeletal:        General: Normal range of motion.     Cervical back: Normal range of motion.  Skin:    General: Skin is warm and dry.  Neurological:     General: No focal deficit present.     Mental Status: She is alert and oriented to person, place, and time.  Psychiatric:        Mood and Affect: Mood normal.        Behavior: Behavior normal.        Judgment: Judgment normal.   NST FHR: 145 bpm, moderate variability, +10x10 accels, no decels Toco: quiet  MAU Course  Procedures NST  MDM Speculum exam shows small amount of dark red blood and clot at vaginal introitus. There does not appear to be any active bleeding from the os. Cervix visually closed. S/w Dr. Pickens regarding patient status. She will be admitted to [facility] on magnesium sulfate and given BMZ. CBC, T&S and Rhogam ordered while in MAU. Wet prep and GC/CT collected. Dr. Pickens discussed patient with NICU and given bleeding the patient is having, he feels patient is unstable for transfer. Admission orders placed by Dr. Pickens  Assessment and Plan  [redacted] weeks gestation or pregnancy Vaginal bleeding affecting pregnancy Placenta previa Chronic hypertension Gestational diabetes affecting pregnancy  - Admit to [facility] - Mag sulfate - Rhogam - BMZ   Danielle L Simpson, CNM 11/03/2021, 2:41 PM  

## 2021-11-04 DIAGNOSIS — Z3A25 25 weeks gestation of pregnancy: Secondary | ICD-10-CM

## 2021-11-04 LAB — GLUCOSE, CAPILLARY
Glucose-Capillary: 126 mg/dL — ABNORMAL HIGH (ref 70–99)
Glucose-Capillary: 130 mg/dL — ABNORMAL HIGH (ref 70–99)
Glucose-Capillary: 141 mg/dL — ABNORMAL HIGH (ref 70–99)
Glucose-Capillary: 199 mg/dL — ABNORMAL HIGH (ref 70–99)

## 2021-11-04 LAB — RH IG WORKUP (INCLUDES ABO/RH)
Gestational Age(Wks): 25.2
Unit division: 0

## 2021-11-04 NOTE — Progress Notes (Signed)
Daily Antepartum Note  Admission Date: 11/03/2021 Current Date: 11/04/2021 9:56 AM  [name] is a 41 y.o. [id number] @ [redacted]w[redacted]d, HD#2, admitted for 1st bleed in the setting of known previa.  Pregnancy complicated by: Patient Active Problem List   Diagnosis Date Noted   Vaginal bleeding during pregnancy 11/03/2021   Right fetal pelviectasis 11/03/2021   Postural dizziness with presyncope 10/24/2021   Palpitations 10/24/2021   Placenta previa, second trimester 09/04/2021   Gestational diabetes 08/14/2021   Pre-diabetes 08/04/2021   Chronic hypertension affecting pregnancy 08/03/2021   Supervision of high-risk pregnancy 08/02/2021   MDD (major depressive disorder), recurrent severe, without psychosis (HCC) 09/30/2020   Panic attacks 09/30/2020   Social anxiety disorder 09/30/2020   Adjustment disorder with mixed anxiety and depressed mood 09/30/2020   Abnormal ECG 08/08/2015   Poor dentition 07/19/2015   AMA (advanced maternal age) multigravida 35+ 06/21/2015   Grand multipara 06/21/2015   Rh negative state in antepartum period 06/21/2015   Anxiety 03/05/2012   Overnight/24hr events:  none  Subjective:  Pt not endorsing any pain or bleeding spotting.   Objective:    Current Vital Signs 24h Vital Sign Ranges  T 97.6 F (36.4 C) Temp  Avg: 98.3 F (36.8 C)  Min: 97.6 F (36.4 C)  Max: 98.9 F (37.2 C)  BP 123/78 BP  Min: 108/56  Max: 145/82  HR 90 Pulse  Avg: 93.6  Min: 88  Max: 103  RR 15 Resp  Avg: 16.2  Min: 12  Max: 18  SaO2 98 % Room Air SpO2  Avg: 97.3 %  Min: 95 %  Max: 100 %       24 Hour I/O Current Shift I/O  Time Ins Outs 07/28 0701 - 07/29 0700 In: 3515.6 [P.O.:1610; I.V.:1905.6] Out: 5050 [Urine:5050] 07/29 0701 - 07/29 1900 In: 310 [P.O.:60; I.V.:250] Out: 0   Fetal Heart Tones: 130 baseline, +accels, no decels, mod variability Tocometry: quiet  Physical exam: General: Well nourished, well developed female in no acute distress. Abdomen: gravid  nttp Cardiovascular: S1, S2 normal, no murmur, rub or gallop, regular rate and rhythm Respiratory: CTAB Extremities: no clubbing, cyanosis or edema Skin: Warm and dry.   Medications: Current Facility-Administered Medications  Medication Dose Route Frequency Provider Last Rate Last Admin   acetaminophen (TYLENOL) tablet 650 mg  650 mg Oral Q4H PRN [place] Bing, MD       betamethasone acetate-betamethasone sodium phosphate (CELESTONE) injection 12 mg  12 mg Intramuscular Once [place] Bing, MD       calcium carbonate (TUMS - dosed in mg elemental calcium) chewable tablet 400 mg of elemental calcium  2 tablet Oral Q4H PRN [place] Bing, MD       docusate sodium (COLACE) capsule 100 mg  100 mg Oral BID PRN [place] Bing, MD       labetalol (NORMODYNE) tablet 200 mg  200 mg Oral BID Cleone Slim M, CNM   200 mg at 11/04/21 3009   lactated ringers infusion   Intravenous Continuous Simpson, Danielle L, CNM       lactated ringers infusion   Intravenous Continuous [place] Bing, MD 75 mL/hr at 11/04/21 0346 New Bag at 11/04/21 0346   magnesium sulfate 40 grams in SWI 1000 mL OB infusion  2 g/hr Intravenous Titrated [place] Bing, MD 50 mL/hr at 11/04/21 0852 2 g/hr at 11/04/21 0852   prenatal multivitamin tablet 1 tablet  1 tablet Oral Q1200 [place] Bing, MD   1 tablet at 11/04/21 [phone]  zolpidem (AMBIEN) tablet 5 mg  5 mg Oral QHS PRN [place] Bing, MD        Labs:  Pending GC/CT Negative wet prep, KB Recent Labs  Lab 11/03/21 1407  WBC 7.5  HGB 11.0*  HCT 32.4*  PLT 255  A NEG   Latest Reference Range & Units 03/05/12 15:42 10/06/15 21:10 09/03/21 11:29 09/03/21 21:17 11/03/21 18:24 11/03/21 21:44 11/04/21 05:50  Glucose-Capillary 70 - 99 mg/dL 97 93 [phone]  [phone]    Radiology:  No new imaging  Assessment & Plan:  Pt doing well *Pregnancy: category I with accels. Fetal status reassuring *Previa, VB: d/w her recommend keeping over the weekend and  reassess on Monday for possible discharge. NICU orange. Okay to keep *Preterm: bmz #2 at 1400 today. Can d/c mg at that time.  *GDMa1: watch sugars. Started GDM diet this morning *CHTN: watch BPs. Continue on home labetalol but may need to hold. No heart palpitation issues *Rh neg: s/p rhogam x 1 yesterday. Neg KB on admit *PPx: SCDs, bedrest with bathroom privileges *FEN/GI: SLIV after Mg. Okay to start diet.  *Dispo: likely monday  Cornelia Copa MD Attending Center for [employer] (Faculty Practice) GYN Consult Phone: [phone] (M-F, [phone]) & [phone]  (Off hours, weekends, holidays)

## 2021-11-05 ENCOUNTER — Encounter (HOSPITAL_COMMUNITY): Payer: Self-pay | Admitting: Obstetrics and Gynecology

## 2021-11-05 LAB — GLUCOSE, CAPILLARY
Glucose-Capillary: 130 mg/dL — ABNORMAL HIGH (ref 70–99)
Glucose-Capillary: 122 mg/dL — ABNORMAL HIGH (ref 70–99)
Glucose-Capillary: 146 mg/dL — ABNORMAL HIGH (ref 70–99)
Glucose-Capillary: 119 mg/dL — ABNORMAL HIGH (ref 70–99)

## 2021-11-05 MED ORDER — METFORMIN HCL 500 MG PO TABS
500.0000 mg | ORAL_TABLET | Freq: Two times a day (BID) | ORAL | Status: DC
Start: 2021-11-05 — End: 2021-11-05

## 2021-11-05 MED ORDER — METFORMIN HCL 500 MG PO TABS
500.0000 mg | ORAL_TABLET | Freq: Once | ORAL | Status: AC
  Administered 2021-11-05: 500 mg via ORAL
  Filled 2021-11-05: qty 1

## 2021-11-05 MED ORDER — METFORMIN HCL 500 MG PO TABS
500.0000 mg | ORAL_TABLET | Freq: Two times a day (BID) | ORAL | Status: DC
  Administered 2021-11-05 – 2021-11-06 (×2): 500 mg via ORAL
  Filled 2021-11-05 (×2): qty 1

## 2021-11-05 NOTE — Progress Notes (Signed)
Daily Antepartum Note  Admission Date: 11/03/2021 Current Date: 11/05/2021 11:30 AM  Breigh Annett is a 41 y.o. [id number] @ [redacted]w[redacted]d, HD#3, admitted for 1st bleed in the setting of known previa.  Pregnancy complicated by: Patient Active Problem List   Diagnosis Date Noted   Vaginal bleeding during pregnancy 11/03/2021   Right fetal pelviectasis 11/03/2021   Postural dizziness with presyncope 10/24/2021   Palpitations 10/24/2021   Placenta previa, second trimester 09/04/2021   Gestational diabetes 08/14/2021   Pre-diabetes 08/04/2021   Chronic hypertension affecting pregnancy 08/03/2021   Supervision of high-risk pregnancy 08/02/2021   MDD (major depressive disorder), recurrent severe, without psychosis (HCC) 09/30/2020   Panic attacks 09/30/2020   Social anxiety disorder 09/30/2020   Adjustment disorder with mixed anxiety and depressed mood 09/30/2020   Abnormal ECG 08/08/2015   Poor dentition 07/19/2015   AMA (advanced maternal age) multigravida 35+ 06/21/2015   Grand multipara 06/21/2015   Rh negative state in antepartum period 06/21/2015   Anxiety 03/05/2012   Overnight/24hr events:  none  Subjective:  Pt not endorsing any pain or bleeding spotting.   Objective:    Current Vital Signs 24h Vital Sign Ranges  T 98.4 F (36.9 C) Temp  Avg: 98.2 F (36.8 C)  Min: 97.6 F (36.4 C)  Max: 99 F (37.2 C)  BP (!) 105/40 BP  Min: 96/54  Max: 108/47  HR 97 Pulse  Avg: 92.7  Min: 86  Max: 99  RR 17 Resp  Avg: 16.1  Min: 15  Max: 17  SaO2 99 %  (room air) SpO2  Avg: 98.5 %  Min: 97 %  Max: 100 %       24 Hour I/O Current Shift I/O  Time Ins Outs 07/29 0701 - 07/30 0700 In: 3342.5 [P.O.:2280; I.V.:1062.5] Out: 2850 [Urine:2850] No intake/output data recorded.  Fetal Heart Tones: 145 baseline, +accels, no decels, mod variability Tocometry: quiet  Physical exam: General: Well nourished, well developed female in no acute distress. Abdomen: gravid nttp Cardiovascular: S1, S2  normal, no murmur, rub or gallop, regular rate and rhythm Respiratory: CTAB Extremities: no clubbing, cyanosis or edema Skin: Warm and dry.   Medications: Current Facility-Administered Medications  Medication Dose Route Frequency Provider Last Rate Last Admin   acetaminophen (TYLENOL) tablet 650 mg  650 mg Oral Q4H PRN [place] Bing, MD       calcium carbonate (TUMS - dosed in mg elemental calcium) chewable tablet 400 mg of elemental calcium  2 tablet Oral Q4H PRN [place] Bing, MD       docusate sodium (COLACE) capsule 100 mg  100 mg Oral BID PRN [place] Bing, MD       lactated ringers infusion   Intravenous Continuous Simpson, Danielle L, CNM       prenatal multivitamin tablet 1 tablet  1 tablet Oral Q1200 [place] Bing, MD   1 tablet at 11/04/21 0956   zolpidem (AMBIEN) tablet 5 mg  5 mg Oral QHS PRN [place] Bing, MD        Labs:  Pending GC/CT Negative wet prep, KB Recent Labs  Lab 11/03/21 1407  WBC 7.5  HGB 11.0*  HCT 32.4*  PLT 255   A NEG   Latest Reference Range & Units 11/03/21 18:24 11/03/21 21:44 11/04/21 05:50 11/04/21 11:32 11/04/21 17:04 11/04/21 21:52 11/05/21 05:04 11/05/21 10:24  Glucose-Capillary 70 - 99 mg/dL 703 (H) 500 (H) 938 (H) 130 (H) 141 (H) 199 (H) 130 (H) 122 (H)    Radiology:  No new imaging  Assessment & Plan:  Pt doing well *Pregnancy: category I with accels. Fetal status reassuring *Previa, VB: d/w her recommend keeping over the weekend but should be fine for home tomrrow. Pt not currently working and d/w her re: taking it easy, pelvic rest when she goes home *Preterm: s/p bmz#2 yesterday and Mg *GDMa1: watch sugars. Started on metformin 500 bid. Hopefully on temporary b/c of the steroids *CHTN: watch BPs. Labetalol held today due to BPs wnl. No heart palpitation issues *Rh neg: s/p rhogam x 1 yesterday. Neg KB on admit *PPx: SCDs, bedrest with bathroom privileges *FEN/GI: SLIV after Mg. Okay to start diet.  *Dispo:  likely monday  Cornelia Copa MD Attending Center for [employer] (Faculty Practice) GYN Consult Phone: [phone] (M-F, 0800-1700) & [phone]  (Off hours, weekends, holidays)

## 2021-11-06 ENCOUNTER — Other Ambulatory Visit (HOSPITAL_COMMUNITY): Payer: Self-pay

## 2021-11-06 DIAGNOSIS — O24414 Gestational diabetes mellitus in pregnancy, insulin controlled: Secondary | ICD-10-CM | POA: Diagnosis not present

## 2021-11-06 DIAGNOSIS — O10012 Pre-existing essential hypertension complicating pregnancy, second trimester: Secondary | ICD-10-CM

## 2021-11-06 DIAGNOSIS — Z3A25 25 weeks gestation of pregnancy: Secondary | ICD-10-CM | POA: Diagnosis not present

## 2021-11-06 DIAGNOSIS — O4690 Antepartum hemorrhage, unspecified, unspecified trimester: Secondary | ICD-10-CM | POA: Diagnosis not present

## 2021-11-06 LAB — GLUCOSE, CAPILLARY
Glucose-Capillary: 83 mg/dL (ref 70–99)
Glucose-Capillary: 93 mg/dL (ref 70–99)

## 2021-11-06 LAB — GC/CHLAMYDIA PROBE AMP (~~LOC~~) NOT AT ARMC
Chlamydia: NEGATIVE
Comment: NEGATIVE
Comment: NORMAL
Neisseria Gonorrhea: NEGATIVE

## 2021-11-06 MED ORDER — METFORMIN HCL 500 MG PO TABS
500.0000 mg | ORAL_TABLET | Freq: Two times a day (BID) | ORAL | 3 refills | Status: DC
  Filled 2021-11-06: qty 60, 30d supply, fill #0

## 2021-11-06 NOTE — Discharge Summary (Signed)
Antenatal Physician Discharge Summary  Patient ID: Tyasia Packard MRN: [id number] DOB/AGE: Nov 21, 1980 41 y.o.  Admit date: 11/03/2021 Discharge date: 11/06/2021  Admission Diagnoses:  Discharge Diagnoses:   Prenatal Procedures: ultrasound  Consults: Neonatology, Maternal Fetal Medicine  Hospital Course:  Yareth Kearse is a 41 y.o. [id number] with IUP at 52w5dadmitted for sentinel bleed from known placenta previa. Ppt received BMZ x 2 and magnesium sulfate.  Pt was started on metformin due to mildly elevated blood sugars with known gestational diabetes.  Glucose elevation was likely secondary to steroid administration. The patient had no further bleeding since admission, only brown spotting.  She was deemed stable for discharge to home with outpatient follow up.  Discharge Exam: Temp:  [97.9 F (36.6 C)-98.4 F (36.9 C)] 98.4 F (36.9 C) (07/31 1202) Pulse Rate:  [83-100] 85 (07/31 1202) Resp:  [16-19] 19 (07/31 1202) BP: (93-115)/(51-73) 107/73 (07/31 1202) SpO2:  [97 %-100 %] 98 % (07/31 1202) Physical Examination: CONSTITUTIONAL: Well-developed, well-nourished female in no acute distress.  HENT:  Normocephalic, atraumatic, External right and left ear normal. Oropharynx is clear and moist EYES: Conjunctivae and EOM are normal.  NECK: Normal range of motion, supple, no masses SKIN: Skin is warm and dry. No rash noted. Not diaphoretic. No erythema. No pallor. [place] Alert and oriented to person, place, and time. Normal reflexes, muscle tone coordination. No cranial nerve deficit noted. PSYCHIATRIC: Normal mood and affect. Normal behavior. Normal judgment and thought content. CARDIOVASCULAR: Normal heart rate noted, regular rhythm RESPIRATORY: Effort and breath sounds normal, no problems with respiration noted MUSCULOSKELETAL: Normal range of motion. No edema and no tenderness. 2+ distal pulses. ABDOMEN: Soft, nontender, nondistended, gravid. CERVIX:  deferred  Significant  Diagnostic Studies:  Results for orders placed or performed during the hospital encounter of 11/03/21 (from the past 168 hour(s))  Urinalysis, Routine w reflex microscopic Urine, Clean Catch   Collection Time: 11/03/21  1:30 PM  Result Value Ref Range   Color, Urine YELLOW YELLOW   APPearance CLOUDY (A) CLEAR   Specific Gravity, Urine 1.009 1.005 - 1.030   pH 8.0 5.0 - 8.0   Glucose, UA NEGATIVE NEGATIVE mg/dL   Hgb urine dipstick LARGE (A) NEGATIVE   Bilirubin Urine NEGATIVE NEGATIVE   Ketones, ur NEGATIVE NEGATIVE mg/dL   Protein, ur NEGATIVE NEGATIVE mg/dL   Nitrite NEGATIVE NEGATIVE   Leukocytes,Ua NEGATIVE NEGATIVE   RBC / HPF 6-10 0 - 5 RBC/hpf   WBC, UA 0-5 0 - 5 WBC/hpf   Bacteria, UA RARE (A) NONE SEEN   Squamous Epithelial / LPF 0-5 0 - 5   Mucus PRESENT    Non Squamous Epithelial 0-5 (A) NONE SEEN  Rh IG workup (includes ABO/Rh)   Collection Time: 11/03/21  2:04 PM  Result Value Ref Range   Gestational Age(Wks) 25.2    Unit Number [id number]   Blood Component Type RHIG    Unit division 00    Status of Unit ISSUED,FINAL    Transfusion Status      OK TO TRANSFUSE Performed at [facility], [address], [place] [address]  Type and screen [place]  Collection Time: 11/03/21  2:04 PM  Result Value Ref Range   ABO/RH(D) A NEG    Antibody Screen NEG    Sample Expiration      11/06/2021,2359 Performed at [facility] [address] [place] [address]  CBC   Collection Time: 11/03/21  2:07 PM  Result Value Ref Range   WBC 7.5 4.0 - 10.5 K/uL   RBC 3.68 (L) 3.87 - 5.11 MIL/uL   Hemoglobin 11.0 (L) 12.0 - 15.0 g/dL   HCT 32.4 (L) 36.0 - 46.0 %   MCV 88.0 80.0 - 100.0 fL   MCH 29.9 26.0 - 34.0 pg   MCHC 34.0 30.0 - 36.0 g/dL   RDW 15.1 11.5 - 15.5 %   Platelets 255 150 - 400 K/uL   nRBC 0.0 0.0 - 0.2 %  Kleihauer-Betke stain   Collection Time: 11/03/21  2:13 PM  Result Value Ref Range   Fetal Cells  % 0 %   Quantitation Fetal Hemoglobin 0.0000 mL   # Vials RhIg 1   Wet prep, genital   Collection Time: 11/03/21  2:14 PM   Specimen: Vaginal  Result Value Ref Range   Yeast Wet Prep HPF POC NONE SEEN NONE SEEN   Trich, Wet Prep NONE SEEN NONE SEEN   Clue Cells Wet Prep HPF POC NONE SEEN NONE SEEN   WBC, Wet Prep HPF POC <10 <10   Sperm NONE SEEN   Glucose, capillary   Collection Time: 11/03/21  6:24 PM  Result Value Ref Range   Glucose-Capillary 141 (H) 70 - 99 mg/dL  Glucose, capillary   Collection Time: 11/03/21  9:44 PM  Result Value Ref Range   Glucose-Capillary 150 (H) 70 - 99 mg/dL  Glucose, capillary   Collection Time: 11/04/21  5:50 AM  Result Value Ref Range   Glucose-Capillary 126 (H) 70 - 99 mg/dL  Glucose, capillary   Collection Time: 11/04/21 11:32 AM  Result Value Ref Range   Glucose-Capillary 130 (H) 70 - 99 mg/dL  Glucose, capillary   Collection Time: 11/04/21  5:04 PM  Result Value Ref Range   Glucose-Capillary 141 (H) 70 - 99 mg/dL  Glucose, capillary   Collection Time: 11/04/21  9:52 PM  Result Value Ref Range   Glucose-Capillary 199 (H) 70 - 99 mg/dL  Glucose, capillary   Collection Time: 11/05/21  5:04 AM  Result Value Ref Range   Glucose-Capillary 130 (H) 70 - 99 mg/dL  Glucose, capillary   Collection Time: 11/05/21 10:24 AM  Result Value Ref Range   Glucose-Capillary 122 (H) 70 - 99 mg/dL  Glucose, capillary   Collection Time: 11/05/21  4:45 PM  Result Value Ref Range   Glucose-Capillary 146 (H) 70 - 99 mg/dL  Glucose, capillary   Collection Time: 11/05/21 10:09 PM  Result Value Ref Range   Glucose-Capillary 119 (H) 70 - 99 mg/dL  Glucose, capillary   Collection Time: 11/06/21  6:15 AM  Result Value Ref Range   Glucose-Capillary 83 70 - 99 mg/dL   Comment 1 Notify RN   Glucose, capillary   Collection Time: 11/06/21 10:28 AM  Result Value Ref Range   Glucose-Capillary 93 70 - 99 mg/dL  Results for orders placed or performed in visit  on 11/01/21 (from the past 168 hour(s))  POC Urinalysis Dipstick OB   Collection Time: 11/01/21  4:01 PM  Result Value Ref Range   Color, UA     Clarity, UA     Glucose, UA Negative Negative   Bilirubin, UA     Ketones, UA small    Spec Grav, UA     Blood, UA neg    pH, UA     POC,PROTEIN,UA Negative Negative, Trace, Small (1+), Moderate (2+), Large (3+), 4+   Urobilinogen, UA  Nitrite, UA neg    Leukocytes, UA Negative Negative   Appearance     Odor     US OB Transvaginal  Result Date: 11/01/2021 Table formatting from the original result was not included. Images from the original result were not included.  ..an [employer] Diplomatic Services operational officer) accredited practice Center for [facility] @ [place] [address] Ordering Provider: Janyth Pupa, DO FOLLOW UP SONOGRAM Melinda Gwinner is in the office for a follow up sonogram for EFW and placenta location. She is a 41 y.o. year old [id number] with Estimated Date of Delivery: 02/14/22 by early ultrasound now at  74w0dweeks gestation. Thus far the pregnancy has been complicated by GDM,AMA,placenta previa . GESTATION: SINGLETON PRESENTATION: breech FETAL ACTIVITY:          Heart rate         154          The fetus is active. AMNIOTIC FLUID: The amniotic fluid volume is  normal, 7.1 cm. PLACENTA LOCALIZATION:  anterior, previa GRADE 1 CERVIX: Measures 2.8 cm ADNEXA: The ovaries are normal. GESTATIONAL AGE AND  BIOMETRICS: Gestational criteria: Estimated Date of Delivery: 02/14/22 by early ultrasound now at 240w0drevious Scans:3          BIPARIETAL DIAMETER           6.05 cm         24+4 weeks   29% HEAD CIRCUMFERENCE           24.02 cm         26+1 weeks    69% ABDOMINAL CIRCUMFERENCE           19.44 cm         24+1 weeks   17% FEMUR LENGTH           4.26 cm         23+6 weeks   10%                                                       AVERAGE EGA(BY THIS SCAN):  25 weeks                                                  ESTIMATED FETAL WEIGHT:       678  grams, 15 % ANATOMICAL SURVEY                                                                            COMMENTS CEREBRAL VENTRICLES yes normal  CHOROID PLEXUS yes normal  CEREBELLUM yes normal  CISTERNA MAGNA  Yes  normal   CAVUM SEPTI PELLUCIDI YES NORMAL  NUCHAL REGION yes normal              FACIAL PROFILE yes normal  4 CHAMBERED HEART yes normal  OUTFLOW TRACTS YES normaL  3VV YES NORMAL  3VTV  YES NORMAL  SITUS YES NORMAL      DIAPHRAGM yes normal  STOMACH yes normal  RENAL REGION yes abnormal Right renal pelvic dilatation 6.5 mm BLADDER yes normal  PLACENTA CORD INSERTION Yes   Normal       3 VESSEL CORD yes normal  SPINE yes normal          GENITALIA   female     SUSPECTED ABNORMALITIES:  Anterior placenta previa RRPD 6.5 mm QUALITY OF SCAN: satisfactory TECHNICIAN COMMENTS: [place] TA/TV 25 wks,breech,anterior placenta previa gr 1,cx 2.8 cm,SVP of fluid 7.1 cm,normal ovaries,fhr 154 bpm,right renal pelvic dilatation 6.5 mm,LK 4.1 mm WNL,EFW 678 g 15%,AC 17% A copy of this report including all images has been saved and backed up to a second source for retrieval if needed. All measures and details of the anatomical scan, placentation, fluid volume and pelvic anatomy are contained in that report. Amber Heide Guile 11/01/2021 4:06 PM Clinical Impression and recommendations: I have reviewed the sonogram results above, combined with the patient's current clinical course, below are my impressions and any appropriate recommendations for management based on the sonographic findings. 1.  [id number] Estimated Date of Delivery: 02/14/22 by serial sonographic evaluations 2.  Fetal sonographic surveillance findings: a). Normal fluid volume b). Normal growth percentile with appropriate interval growth:  15% C). Placenta previa grade 1- low implantation.  Will continue to follow by ultrasound D). Mild right renal pelvic dilation, previously noted 3.  Normal general sonographic findings  Recommend continued prenatal evaluations and care based on this sonogram and as clinically indicated from the patient's clinical course. Janyth Pupa, DO Attending [place], [facility] for [facility], [facility]    US OB Follow Up  Result Date: 11/01/2021 Table formatting from the original result was not included. Images from the original result were not included.  ..an [employer] Diplomatic Services operational officer) accredited practice Center for [facility] @ [place] [address] Ordering Provider: Janyth Pupa, DO FOLLOW UP SONOGRAM Gessica Jawad is in the office for a follow up sonogram for EFW and placenta location. She is a 41 y.o. year old [id number] with Estimated Date of Delivery: 02/14/22 by early ultrasound now at  29w0dweeks gestation. Thus far the pregnancy has been complicated by GDM,AMA,placenta previa . GESTATION: SINGLETON PRESENTATION: breech FETAL ACTIVITY:          Heart rate         154          The fetus is active. AMNIOTIC FLUID: The amniotic fluid volume is  normal, 7.1 cm. PLACENTA LOCALIZATION:  anterior, previa GRADE 1 CERVIX: Measures 2.8 cm ADNEXA: The ovaries are normal. GESTATIONAL AGE AND  BIOMETRICS: Gestational criteria: Estimated Date of Delivery: 02/14/22 by early ultrasound now at 231w0drevious Scans:3          BIPARIETAL DIAMETER           6.05 cm         24+4 weeks   29% HEAD CIRCUMFERENCE           24.02 cm         26+1 weeks    69% ABDOMINAL CIRCUMFERENCE           19.44 cm         24+1 weeks   17% FEMUR LENGTH           4.26 cm  23+6 weeks   10%                                                       AVERAGE EGA(BY THIS SCAN):  25 weeks                                                 ESTIMATED FETAL WEIGHT:       678  grams, 15 % ANATOMICAL SURVEY                                                                            COMMENTS CEREBRAL VENTRICLES yes normal  CHOROID  PLEXUS yes normal  CEREBELLUM yes normal  CISTERNA MAGNA  Yes  normal   CAVUM SEPTI PELLUCIDI YES NORMAL  NUCHAL REGION yes normal              FACIAL PROFILE yes normal  4 CHAMBERED HEART yes normal  OUTFLOW TRACTS YES normaL  3VV YES NORMAL  3VTV YES NORMAL  SITUS YES NORMAL      DIAPHRAGM yes normal  STOMACH yes normal  RENAL REGION yes abnormal Right renal pelvic dilatation 6.5 mm BLADDER yes normal  PLACENTA CORD INSERTION Yes   Normal       3 VESSEL CORD yes normal  SPINE yes normal          GENITALIA   female     SUSPECTED ABNORMALITIES:  Anterior placenta previa RRPD 6.5 mm QUALITY OF SCAN: satisfactory TECHNICIAN COMMENTS: [place] TA/TV 25 wks,breech,anterior placenta previa gr 1,cx 2.8 cm,SVP of fluid 7.1 cm,normal ovaries,fhr 154 bpm,right renal pelvic dilatation 6.5 mm,LK 4.1 mm WNL,EFW 678 g 15%,AC 17% A copy of this report including all images has been saved and backed up to a second source for retrieval if needed. All measures and details of the anatomical scan, placentation, fluid volume and pelvic anatomy are contained in that report. Amber Heide Guile 11/01/2021 4:06 PM Clinical Impression and recommendations: I have reviewed the sonogram results above, combined with the patient's current clinical course, below are my impressions and any appropriate recommendations for management based on the sonographic findings. 1.  [id number] Estimated Date of Delivery: 02/14/22 by serial sonographic evaluations 2.  Fetal sonographic surveillance findings: a). Normal fluid volume b). Normal growth percentile with appropriate interval growth:  15% C). Placenta previa grade 1- low implantation.  Will continue to follow by ultrasound D). Mild right renal pelvic dilation, previously noted 3.  Normal general sonographic findings Recommend continued prenatal evaluations and care based on this sonogram and as clinically indicated from the patient's clinical course. Janyth Pupa, DO Attending [place], [facility] for [facility] [place] [facility]     Future Appointments  Date Time Provider [place]  11/14/2021  3:15 PM [facility]  11/29/2021  2:15 PM [facility] [place] [facility] None  11/29/2021  3:10 PM Janyth Pupa, DO [facility]  12/28/2021 10:00 AM [facility] - [facility] [place] [facility] None  12/28/2021 10:50 AM Florian Buff, MD [facility]  01/15/2022 11:00 AM Tobb, Godfrey Pick, DO [facility]  01/25/2022 10:00 AM [facility] - [facility] [place] [facility] None  01/25/2022 10:50 AM Florian Buff, MD [facility]    Discharge Condition: Stable  Discharge disposition: 01-Home or Self Care       Discharge Instructions     Discharge activity:  No Restrictions   Complete by: As directed    Discharge diet:   Complete by: As directed    Carb controlled diabetic diet   Fetal Kick Count:  Lie on our left side for one hour after a meal, and count the number of times your baby kicks.  If it is less than 5 times, get up, move around and drink some juice.  Repeat the test 30 minutes later.  If it is still less than 5 kicks in an hour, notify your doctor.   Complete by: As directed    Notify physician for a general feeling that "something is not right"   Complete by: As directed    Notify physician for increase or change in vaginal discharge   Complete by: As directed    Notify physician for intestinal cramps, with or without diarrhea, sometimes described as "gas pain"   Complete by: As directed    Notify physician for leaking of fluid   Complete by: As directed    Notify physician for low, dull backache, unrelieved by heat or Tylenol   Complete by: As directed    Notify physician for menstrual like cramps   Complete by: As directed    Notify physician for pelvic pressure   Complete by: As directed    Notify physician for uterine contractions.  These may be painless and feel like the uterus is tightening or the baby is  "balling up"   Complete by: As  directed    Notify physician for vaginal bleeding   Complete by: As directed    PRETERM LABOR:  Includes any of the follwing symptoms that occur between 20 - [redacted] weeks gestation.  If these symptoms are not stopped, preterm labor can result in preterm delivery, placing your baby at risk   Complete by: As directed    Sexual Activity:     Complete by: As directed    Pelvic rest until placenta previa confirmed cleared      Allergies as of 11/06/2021       Reactions   Sulfamethoxazole-trimethoprim Swelling, Rash        Medication List     STOP taking these medications    Doxylamine-Pyridoxine 10-10 MG Tbec Commonly known as: Diclegis       TAKE these medications    Accu-Chek FastClix Lancets Misc Use as directed to check blood sugar 4 times daily   Accu-Chek Guide Me w/Device Kit 1 each by Does not apply route 4 (four) times daily.   Accu-Chek Guide test strip Generic drug: glucose blood Use as instructed to check blood sugar 4 times daily   aspirin EC 81 MG tablet Take 2 tablets (162 mg total) by mouth daily. Swallow whole.   azelastine 0.1 % nasal spray Commonly known as: ASTELIN Place 1 spray into both nostrils as needed. Use in each nostril as directed   Blood Pressure Monitor Misc For regular home bp monitoring during pregnancy   FLINTSTONES COMPLETE PO Take 2 tablets by mouth.  labetalol 200 MG tablet Commonly known as: NORMODYNE Take 1 tablet (200 mg total) by mouth 2 (two) times daily.   loratadine 10 MG tablet Commonly known as: CLARITIN Take 10 mg by mouth daily as needed for allergies.   metFORMIN 500 MG tablet Commonly known as: GLUCOPHAGE Take 1 tablet (500 mg total) by mouth 2 (two) times daily with a meal.        Follow-up Information     [facility]. Schedule an appointment as soon as possible for a visit in 3 day(s).   Specialty: Obstetrics and Gynecology Why: hospital follow up Contact information: [place]                Total discharge time: 30 minutes   Signed: Griffin Basil M.D. 11/06/2021, 12:39 PM

## 2021-11-07 ENCOUNTER — Encounter: Payer: Self-pay | Admitting: Obstetrics & Gynecology

## 2021-11-07 ENCOUNTER — Ambulatory Visit (INDEPENDENT_AMBULATORY_CARE_PROVIDER_SITE_OTHER): Payer: Medicaid Other | Admitting: Obstetrics & Gynecology

## 2021-11-07 VITALS — BP 125/74 | HR 63 | Wt 222.0 lb

## 2021-11-07 DIAGNOSIS — O0992 Supervision of high risk pregnancy, unspecified, second trimester: Secondary | ICD-10-CM

## 2021-11-07 DIAGNOSIS — O24419 Gestational diabetes mellitus in pregnancy, unspecified control: Secondary | ICD-10-CM

## 2021-11-07 DIAGNOSIS — O4400 Placenta previa specified as without hemorrhage, unspecified trimester: Secondary | ICD-10-CM

## 2021-11-07 NOTE — Progress Notes (Signed)
HIGH-RISK PREGNANCY VISIT Patient name: [name] MRN [id number]  Date of birth: 1981-03-09 Chief Complaint:   Follow-up (Was in hospital for 3 nights for bleeding)  History of Present Illness:   [name] is a 41 y.o. [id number] female at [redacted]w[redacted]d with an Estimated Date of Delivery: 02/14/22 being seen today for ongoing management of a high-risk pregnancy complicated by placneta previa, anterior.    Today she reports no complaints. Contractions: Not present. Vag. Bleeding: None.  Movement: Present. denies leaking of fluid.      08/03/2021   10:21 AM 01/19/2020   10:54 AM 12/31/2017    8:59 AM 11/01/2017   10:06 AM 09/19/2017    8:38 AM  Depression screen PHQ 2/9  Decreased Interest 0 0 0 0 0  Down, Depressed, Hopeless 1 0 0 0 1  PHQ - 2 Score 1 0 0 0 1  Altered sleeping 0 0 0 0 0  Tired, decreased energy 1 0 1 0 0  Change in appetite 0 0     Feeling bad or failure about yourself  0 1 0 1 0  Trouble concentrating 0 0 0 0 0  Moving slowly or fidgety/restless 0 0     Suicidal thoughts 0 0 0 0 0  PHQ-9 Score 2 1 1 1 1         08/03/2021   10:21 AM 01/19/2020   10:55 AM  GAD 7 : Generalized Anxiety Score  Nervous, Anxious, on Edge 1 0  Control/stop worrying 0 0  Worry too much - different things 0 1  Trouble relaxing 0 0  Restless 0 0  Easily annoyed or irritable 0 0  Afraid - awful might happen 0 0  Total GAD 7 Score 1 1     Review of Systems:   Pertinent items are noted in HPI Denies abnormal vaginal discharge w/ itching/odor/irritation, headaches, visual changes, shortness of breath, chest pain, abdominal pain, severe nausea/vomiting, or problems with urination or bowel movements unless otherwise stated above. Pertinent History Reviewed:  Reviewed past medical,surgical, social, obstetrical and family history.  Reviewed problem list, medications and allergies. Physical Assessment:   Vitals:   11/07/21 1542  BP: 125/74  Pulse: 63  Weight: 222 lb (100.7 kg)   Body mass index is 39.33 kg/m.           Physical Examination:   General appearance: alert, well appearing, and in no distress  Mental status: alert, oriented to person, place, and time  Skin: warm & dry   Extremities: Edema: None    Cardiovascular: normal heart rate noted  Respiratory: normal respiratory effort, no distress  Abdomen: gravid, soft, non-tender  Pelvic: Cervical exam deferred         Fetal Status:     Movement: Present    Fetal Surveillance Testing today: FHR 160  Chaperone: N/A    No results found for this or any previous visit (from the past 24 hour(s)).  Assessment & Plan:  High-risk pregnancy: 01/07/22 at [redacted]w[redacted]d with an Estimated Date of Delivery: 02/14/22      ICD-10-CM   1. Supervision of high risk pregnancy in second trimester  O09.92     2. Placenta previa antepartum, anterior(no previous C section)  O44.00    bled 11/03/21    3. Gestational diabetes mellitus, class A2  O24.419    Metformin 500 mg BID(received BMZ)        Meds: No orders of the defined types were placed in this encounter.  Orders: No orders of the defined types were placed in this encounter.    Labs/procedures today: none  Treatment Plan:  pelvic rest rescan to track the previa, based on images reasonable chance it will resolve, if doesn't will need a c section    Follow-up: No follow-ups on file.   Future Appointments  Date Time Provider Department Center  11/14/2021  3:15 PM [facility]  11/29/2021  2:15 PM [facility] [place] [facility] None  11/29/2021  3:10 PM Myna Hidalgo, DO [facility]  12/28/2021 10:00 AM [facility] [place] [facility] None  12/28/2021 10:50 AM Lazaro Arms, MD [facility]  01/15/2022 11:00 AM Tobb, Lavona [place]ound, DO [facility]  01/25/2022 10:00 AM [facility] [place] [facility] None  01/25/2022 10:50 AM Lazaro Arms, MD [facility]    No orders of the defined types were placed in this encounter.  Lazaro Arms  Attending  Physician for the Center for [facility] 11/07/2021 4:37 PM

## 2021-11-13 ENCOUNTER — Telehealth: Payer: Self-pay

## 2021-11-13 ENCOUNTER — Encounter: Payer: Self-pay | Admitting: Advanced Practice Midwife

## 2021-11-13 ENCOUNTER — Other Ambulatory Visit: Payer: Self-pay | Admitting: Advanced Practice Midwife

## 2021-11-13 MED ORDER — AZITHROMYCIN 250 MG PO TABS: 250.0000 mg | ORAL_TABLET | Freq: Every day | ORAL | 0 refills | Status: DC

## 2021-11-13 NOTE — Telephone Encounter (Signed)
Returned patients call. States for the last week, she has had dizziness along with pressure in the right side of her head and cheek bone.  Also has some nasal congestion with occasional green mucous but blood noticed as wel.  Took Amoxicillin that she had left over from a previous infection for 4 days but it has not helped. Does she need to be seen or can another antibiotic be sent in? Please advise.

## 2021-11-13 NOTE — Progress Notes (Signed)
Zpack for URI sx >1 week

## 2021-11-13 NOTE — Telephone Encounter (Signed)
Patient called and stated that she is 27 weeks, she is extremely dizzy and has some nasal congestion.  She would like for someone to call her with some advice on what to do.

## 2021-11-14 ENCOUNTER — Encounter: Payer: Medicaid Other | Attending: Family Medicine | Admitting: Registered"

## 2021-11-14 ENCOUNTER — Other Ambulatory Visit: Payer: Self-pay

## 2021-11-14 ENCOUNTER — Ambulatory Visit (INDEPENDENT_AMBULATORY_CARE_PROVIDER_SITE_OTHER): Payer: Medicaid Other | Admitting: Registered"

## 2021-11-14 DIAGNOSIS — Z713 Dietary counseling and surveillance: Secondary | ICD-10-CM | POA: Diagnosis not present

## 2021-11-14 DIAGNOSIS — O24410 Gestational diabetes mellitus in pregnancy, diet controlled: Secondary | ICD-10-CM | POA: Insufficient documentation

## 2021-11-14 DIAGNOSIS — Z3A13 13 weeks gestation of pregnancy: Secondary | ICD-10-CM | POA: Insufficient documentation

## 2021-11-14 NOTE — Patient Instructions (Addendum)
Continue trying to add in more walking, but listen to your body and not over do it and follow your doctor's instructions regarding safe amount of exercise. Continue including beans in your diet. Consider adding more vegetables in your diet. Broccoli and leafy greens are great for pregnancy, can include in smoothies. Broccoli and chicken casserole is an idea you have that the family will also eat.

## 2021-11-14 NOTE — Progress Notes (Signed)
Patient was seen for Gestational Diabetes self-management on 11/14/21  Start time 1519 and End time 1542  Estimated due date: 02/14/22; [redacted]w[redacted]d  Clinical: Medications: reviewed Medical History: h/o 5th preg GDM diet controlled Labs: OGTT 86/200H/173H, A1c 5.7%   Medication: Metformin 500 after breakfast and after dinner, pt denies side effects  Pt glucose log sheet: FBS: 83-95 mg/dL PPBG: 30-865 mg/dL  only 2 readings  above normal last week (lunch only)  Dietary and Lifestyle History: Pt states she was in the hospital for bleeding and was in the hospital for a couple of days. Pt states exercise is restricted but walking is okay.  Pt states she had a shot to help protect the baby's lungs. Pt reports blood sugar was elevated from medication and she started on metformin but was told she may be able to come off metformin after the hospital medication hyperglycemic effect wears off.  Pt reports walking a few times per week around the neighborhood.   Pt reports dinner is between 7 and 8 pm. Pt cannot identify anything that is different about lunch than her other meals.   Physical Activity: ADL + some walking Stress: getting a little better, but is not able to clean due to exercise restriction and also has a lot to do getting children ready to start school again. Older daughter has been helping with cooking. Sleep: not assessed  24 hr Recall:  First Meal: pancake with sugar-free syrup, flavored water Snack: none  Second meal: tuna sandwich Snack: popcorn Third meal: bologna and cheese, 1/2 grilled cheese sandwich Snack:   Beverages: 6+ bottles of water, 2% milk  NUTRITION INTERVENTION  Nutrition education (E-1) on the following topics:   Initial Follow-up  [x]  []  Definition of Gestational Diabetes [x]  []  Why dietary management is important in controlling blood glucose [x]  []  Effects each nutrient has on blood glucose levels [x]  []  Simple carbohydrates vs complex  carbohydrates [x]  []  Fluid intake [x]  []  Creating a balanced meal plan  [x]  Vegetables for pregnancy [x]  []  Carbohydrate counting  [x]  []  When to check blood glucose levels [x]  []  Proper blood glucose monitoring techniques [x]  [x]  Effect of stress and stress reduction techniques  [x]  [x]  Exercise effect on blood glucose levels, appropriate exercise during pregnancy [x]  []  Importance of limiting caffeine and abstaining from alcohol and smoking [x]  []  Medications used for blood sugar control during pregnancy [x]  []  Hypoglycemia and rule of 15 [x]  []  Postpartum self care  Patient received handouts: none  Patient will be seen for follow-up as needed.

## 2021-11-15 ENCOUNTER — Telehealth: Payer: Self-pay | Admitting: Obstetrics & Gynecology

## 2021-11-15 NOTE — Telephone Encounter (Signed)
Called patient to review her concerns.  She notes pressure in her head and some dizziness.  She also notes nose bleeds.  Good fetal movement. Normal BP.  Currently on Zpak.  Denies SOB/CP.  Denies cough/sore throat.  No other acute complaints.  Advised humidifier, lots of hydration and may try OTC Tylenol- either sinus/cold or regular.  If no improvement or worsening may also consider PCP or Urgent Care.  Otherwise lots of rest and f/u as scheduled.  Myna Hidalgo, DO Attending Obstetrician & Gynecologist, [facility] for [employer], [facility]

## 2021-11-21 ENCOUNTER — Ambulatory Visit (INDEPENDENT_AMBULATORY_CARE_PROVIDER_SITE_OTHER): Payer: Medicaid Other | Admitting: Obstetrics & Gynecology

## 2021-11-21 ENCOUNTER — Encounter: Payer: Self-pay | Admitting: Obstetrics & Gynecology

## 2021-11-21 VITALS — BP 122/67 | HR 51 | Wt 223.0 lb

## 2021-11-21 DIAGNOSIS — O0992 Supervision of high risk pregnancy, unspecified, second trimester: Secondary | ICD-10-CM | POA: Diagnosis not present

## 2021-11-21 DIAGNOSIS — O10919 Unspecified pre-existing hypertension complicating pregnancy, unspecified trimester: Secondary | ICD-10-CM

## 2021-11-21 DIAGNOSIS — Z3A27 27 weeks gestation of pregnancy: Secondary | ICD-10-CM | POA: Diagnosis not present

## 2021-11-21 DIAGNOSIS — O24410 Gestational diabetes mellitus in pregnancy, diet controlled: Secondary | ICD-10-CM

## 2021-11-21 LAB — POCT URINALYSIS DIPSTICK OB
Blood, UA: NEGATIVE
Glucose, UA: NEGATIVE
Ketones, UA: NEGATIVE
Leukocytes, UA: NEGATIVE
Nitrite, UA: NEGATIVE
POC,PROTEIN,UA: NEGATIVE

## 2021-11-21 NOTE — Progress Notes (Signed)
HIGH-RISK PREGNANCY VISIT Patient name: [name] MRN [id number]  Date of birth: 17-Oct-1980 Chief Complaint:   Routine Prenatal Visit (Watery discharge)  History of Present Illness:   [name] is a 41 y.o. [id number] female at [redacted]w[redacted]d with an Estimated Date of Delivery: 02/14/22 being seen today for ongoing management of a high-risk pregnancy complicated by grand multip with anterior previs(no section) chronic hypertension, A2DM.    Today she reports no complaints. Contractions: Not present. Vag. Bleeding: None.  Movement: Present. denies leaking of fluid.      08/03/2021   10:21 AM 01/19/2020   10:54 AM 12/31/2017    8:59 AM 11/01/2017   10:06 AM 09/19/2017    8:38 AM  Depression screen PHQ 2/9  Decreased Interest 0 0 0 0 0  Down, Depressed, Hopeless 1 0 0 0 1  PHQ - 2 Score 1 0 0 0 1  Altered sleeping 0 0 0 0 0  Tired, decreased energy 1 0 1 0 0  Change in appetite 0 0     Feeling bad or failure about yourself  0 1 0 1 0  Trouble concentrating 0 0 0 0 0  Moving slowly or fidgety/restless 0 0     Suicidal thoughts 0 0 0 0 0  PHQ-9 Score 2 1 1 1 1         08/03/2021   10:21 AM 01/19/2020   10:55 AM  GAD 7 : Generalized Anxiety Score  Nervous, Anxious, on Edge 1 0  Control/stop worrying 0 0  Worry too much - different things 0 1  Trouble relaxing 0 0  Restless 0 0  Easily annoyed or irritable 0 0  Afraid - awful might happen 0 0  Total GAD 7 Score 1 1     Review of Systems:   Pertinent items are noted in HPI Denies abnormal vaginal discharge w/ itching/odor/irritation, headaches, visual changes, shortness of breath, chest pain, abdominal pain, severe nausea/vomiting, or problems with urination or bowel movements unless otherwise stated above. Pertinent History Reviewed:  Reviewed past medical,surgical, social, obstetrical and family history.  Reviewed problem list, medications and allergies. Physical Assessment:   Vitals:   11/21/21 1510  BP: 122/67   Pulse: (!) 51  Weight: 223 lb (101.2 kg)  Body mass index is 39.5 kg/m.           Physical Examination:   General appearance: alert, well appearing, and in no distress  Mental status: alert, oriented to person, place, and time  Skin: warm & dry   Extremities: Edema: None    Cardiovascular: normal heart rate noted  Respiratory: normal respiratory effort, no distress  Abdomen: gravid, soft, non-tender  Pelvic: Cervical exam deferred        SSE no fluid cervical mucous Fetal Status:     Movement: Present    Fetal Surveillance Testing today: FHR 144   Chaperone: 11/23/21    Results for orders placed or performed in visit on 11/21/21 (from the past 24 hour(s))  POC Urinalysis Dipstick OB   Collection Time: 11/21/21  3:11 PM  Result Value Ref Range   Color, UA     Clarity, UA     Glucose, UA Negative Negative   Bilirubin, UA     Ketones, UA neg    Spec Grav, UA     Blood, UA neg    pH, UA     POC,PROTEIN,UA Negative Negative, Trace, Small (1+), Moderate (2+), Large (3+), 4+   Urobilinogen,  UA     Nitrite, UA neg    Leukocytes, UA Negative Negative   Appearance     Odor      Assessment & Plan:  High-risk pregnancy: [id number] at [redacted]w[redacted]d with an Estimated Date of Delivery: 02/14/22      ICD-10-CM   1. Supervision of high risk pregnancy in second trimester  O09.92 Antibody screen    CBC    HIV Antibody (routine testing w rflx)    RPR    POC Urinalysis Dipstick OB    2. Chronic hypertension affecting pregnancy  O10.919 POC Urinalysis Dipstick OB    3. Diet controlled gestational diabetes mellitus (GDM) in second trimester  O24.410     4. [redacted] weeks gestation of pregnancy  Z3A.27 Antibody screen    CBC    HIV Antibody (routine testing w rflx)    RPR    POC Urinalysis Dipstick OB        Meds: No orders of the defined types were placed in this encounter.   Orders:  Orders Placed This Encounter  Procedures   CBC   HIV Antibody (routine testing w rflx)   RPR    POC Urinalysis Dipstick OB   Antibody screen     Labs/procedures today: none  Treatment Plan:  repeat scqn 8/23 to check placentation  Reviewed: Preterm labor symptoms and general obstetric precautions including but not limited to vaginal bleeding, contractions, leaking of fluid and fetal movement were reviewed in detail with the patient.  All questions were answered. Does have home bp cuff. Office bp cuff given: not applicable. Check bp daily, let us know if consistently >140 and/or >90.  Follow-up: Return for keep scheduled.   Future Appointments  Date Time Provider Department Center  11/29/2021  2:15 PM [facility] [place] [facility] None  11/29/2021  3:10 PM Myna Hidalgo, DO [facility]  12/28/2021 10:00 AM [facility] [place] [facility] None  12/28/2021 10:50 AM Lazaro Arms, MD [facility]  01/15/2022 11:00 AM Tobb, Lavona [place], DO [facility]  01/25/2022 10:00 AM [facility] [place] [facility] None  01/25/2022 10:50 AM Lazaro Arms, MD [facility]    Orders Placed This Encounter  Procedures   CBC   HIV Antibody (routine testing w rflx)   RPR   POC Urinalysis Dipstick OB   Antibody screen   Lazaro Arms  Attending Physician for the Center for [facility] 11/21/2021 3:33 PM

## 2021-11-23 LAB — AB SCR+ANTIBODY ID: Antibody Screen: POSITIVE — AB

## 2021-11-23 LAB — RPR: RPR Ser Ql: NONREACTIVE

## 2021-11-23 LAB — CBC
Hematocrit: 35.6 % (ref 34.0–46.6)
Hemoglobin: 11.6 g/dL (ref 11.1–15.9)
MCH: 29.1 pg (ref 26.6–33.0)
MCHC: 32.6 g/dL (ref 31.5–35.7)
MCV: 89 fL (ref 79–97)
Platelets: 268 10*3/uL (ref 150–450)
RBC: 3.99 x10E6/uL (ref 3.77–5.28)
RDW: 14.3 % (ref 11.7–15.4)
WBC: 8.7 10*3/uL (ref 3.4–10.8)

## 2021-11-23 LAB — HIV ANTIBODY (ROUTINE TESTING W REFLEX): HIV Screen 4th Generation wRfx: NONREACTIVE

## 2021-11-23 LAB — ANTIBODY SCREEN

## 2021-11-24 ENCOUNTER — Ambulatory Visit: Payer: Medicaid Other | Admitting: Cardiology

## 2021-11-24 ENCOUNTER — Encounter: Payer: Self-pay | Admitting: Cardiology

## 2021-11-24 VITALS — BP 116/82 | HR 90 | Ht 63.0 in | Wt 225.4 lb

## 2021-11-24 DIAGNOSIS — O10919 Unspecified pre-existing hypertension complicating pregnancy, unspecified trimester: Secondary | ICD-10-CM

## 2021-11-24 DIAGNOSIS — I493 Ventricular premature depolarization: Secondary | ICD-10-CM | POA: Diagnosis not present

## 2021-11-24 DIAGNOSIS — O99210 Obesity complicating pregnancy, unspecified trimester: Secondary | ICD-10-CM

## 2021-11-24 MED ORDER — PROPRANOLOL HCL 10 MG PO TABS: 10.0000 mg | ORAL_TABLET | Freq: Every day | ORAL | 3 refills | Status: DC

## 2021-11-24 NOTE — Progress Notes (Unsigned)
Cardiology Office Note:    Date:  11/30/2021   ID:  [name], DOB Feb 21, 1981, MRN [id number]  PCP:  Ralph Leyden, FNP  Cardiologist:  None  Electrophysiologist:  None   Referring MD: Ralph Leyden, FNP   " I am still experiencing palpitations and it makes me short of breath"  History of Present Illness:    [name] is a 41 y.o. female with a hx of gestational diabetes, chronic hypertension in pregnancy> I saw her initially on 10/24/2021 at that time  she reported increasing palpitations and dizziness.  I placed a monitor on the patient. Since I saw the patient she wore her monitor.  She is here to discuss her monitor results. She is still experiencing palpitations and shortness of breath.   Past Medical History:  Diagnosis Date   Anemia    Anxiety "fast hearbeat"   took Benadryl at end of pregnancy   Back pain affecting pregnancy in first trimester 05/27/2015   Calculus of gallbladder without cholecystitis without obstruction 05/31/2019   Chlamydia 08/03/2021   06/21/21 @ [facility]  Only took 3 doxycycline (d/t n/v)   POC 62/69/48: neg   Complication of anesthesia    Epidural 1 sided   Depression    During 1 pregnancy   Diabetes mellitus without complication ([place])    Endometriosis    Gallstones    Gestational diabetes    just started checking BS 03/05/12   Headache(784.0)    Hematuria 05/27/2015   History of gestational diabetes in prior pregnancy, currently pregnant 06/21/2015   History of kidney stones    Irregular periods    Pregnancy induced hypertension    Recurrent upper respiratory infection (URI)    Mostly when pregnant   Spotting affecting pregnancy in first trimester 01/27/2015    Past Surgical History:  Procedure Laterality Date   APPENDECTOMY Right 09/03/2021   CHOLECYSTECTOMY N/A 03/20/2019   Procedure: LAPAROSCOPIC CHOLECYSTECTOMY;  Surgeon: Aviva Signs, MD;  Location: [facility];  Service: General;  Laterality: N/A;   LAPAROSCOPIC  APPENDECTOMY N/A 09/03/2021   Procedure: APPENDECTOMY LAPAROSCOPIC;  Surgeon: Ralene Ok, MD;  Location: [facility];  Service: General;  Laterality: N/A;   MULTIPLE TOOTH EXTRACTIONS     WISDOM TOOTH EXTRACTION      Current Medications: Current Meds  Medication Sig   Accu-Chek FastClix Lancets MISC Use as directed to check blood sugar 4 times daily   aspirin 81 MG EC tablet Take 2 tablets (162 mg total) by mouth daily. Swallow whole.   azelastine (ASTELIN) 0.1 % nasal spray Place 1 spray into both nostrils as needed. Use in each nostril as directed   Blood Glucose Monitoring Suppl (ACCU-CHEK GUIDE ME) w/Device KIT 1 each by Does not apply route 4 (four) times daily.   Blood Pressure Monitor MISC For regular home bp monitoring during pregnancy   glucose blood (ACCU-CHEK GUIDE) test strip Use as instructed to check blood sugar 4 times daily   loratadine (CLARITIN) 10 MG tablet Take 10 mg by mouth daily as needed for allergies.   metFORMIN (GLUCOPHAGE) 500 MG tablet Take 1 tablet (500 mg total) by mouth 2 (two) times daily with a meal.   Pediatric Multivitamins-Iron (FLINTSTONES COMPLETE PO) Take 2 tablets by mouth.   propranolol (INDERAL) 10 MG tablet Take 1 tablet (10 mg total) by mouth daily.     Allergies:   Sulfamethoxazole-trimethoprim   Social History   Socioeconomic History   Marital status: Divorced    Spouse name:  Not on file   Number of children: Not on file   Years of education: Not on file   Highest education level: Not on file  Occupational History   Not on file  Tobacco Use   Smoking status: Former    Packs/day: 0.10    Years: 0.50    Total pack years: 0.05    Types: Cigarettes    Start date: 02/19/1998    Quit date: 09/17/1998    Years since quitting: 23.2   Smokeless tobacco: Never  Vaping Use   Vaping Use: Never used  Substance and Sexual Activity   Alcohol use: Not Currently    Comment: not while pregnant   Drug use: No   Sexual activity: Not Currently     Partners: Male    Birth control/protection: None  Other Topics Concern   Not on file  Social History Narrative   Lives in [place], [place]   Does not work outside home.   Married for over 3 years.    Wear seatbelt.   Eats all food groups.   Enjoys dance.    Social Determinants of Health   Financial Resource Strain: Low Risk  (01/19/2020)   Overall Financial Resource Strain (CARDIA)    Difficulty of Paying Living Expenses: Not very hard  Food Insecurity: No Food Insecurity (08/03/2021)   Hunger Vital Sign    Worried About Running Out of Food in the Last Year: Never true    Ran Out of Food in the Last Year: Never true  Transportation Needs: No Transportation Needs (08/03/2021)   PRAPARE - Hydrologist (Medical): No    Lack of Transportation (Non-Medical): No  Physical Activity: Insufficiently Active (08/03/2021)   Exercise Vital Sign    Days of Exercise per Week: 1 day    Minutes of Exercise per Session: 10 min  Stress: No Stress Concern Present (08/03/2021)   [place]    Feeling of Stress : Only a little  Social Connections: Socially Isolated (08/03/2021)   Social Connection and Isolation Panel [NHANES]    Frequency of Communication with Friends and Family: More than three times a week    Frequency of Social Gatherings with Friends and Family: Three times a week    Attends Religious Services: Never    Active Member of Clubs or Organizations: No    Attends Archivist Meetings: Never    Marital Status: Divorced     Family History: The patient's family history includes ADD / ADHD in her daughter, daughter, daughter, and daughter; Anxiety disorder in her mother and sister; Asthma in her daughter; Breast cancer in her maternal grandmother; Cancer in her maternal grandmother and paternal grandmother; Depression in her mother; Heart disease in her maternal grandmother; Heart  failure in her maternal grandmother; Hypertension in her father and mother; Seizures in her daughter.  ROS:   Review of Systems  Constitution: Negative for decreased appetite, fever and weight gain.  HENT: Negative for congestion, ear discharge, hoarse voice and sore throat.   Eyes: Negative for discharge, redness, vision loss in right eye and visual halos.  Cardiovascular: Negative for chest pain, dyspnea on exertion, leg swelling, orthopnea and palpitations.  Respiratory: Negative for cough, hemoptysis, shortness of breath and snoring.   Endocrine: Negative for heat intolerance and polyphagia.  Hematologic/Lymphatic: Negative for bleeding problem. Does not bruise/bleed easily.  Skin: Negative for flushing, nail changes, rash and suspicious lesions.  Musculoskeletal: Negative  for arthritis, joint pain, muscle cramps, myalgias, neck pain and stiffness.  Gastrointestinal: Negative for abdominal pain, bowel incontinence, diarrhea and excessive appetite.  Genitourinary: Negative for decreased libido, genital sores and incomplete emptying.  Neurological: Negative for brief paralysis, focal weakness, headaches and loss of balance.  Psychiatric/Behavioral: Negative for altered mental status, depression and suicidal ideas.  Allergic/Immunologic: Negative for HIV exposure and persistent infections.    EKGs/Labs/Other Studies Reviewed:    The following studies were reviewed today:   EKG:  The ekg ordered today demonstrates   ZIO monitor 11/14/2021 Patch Wear Time:  7 days and 22 hours (2023-07-24T14:17:12-398 to 2023-08-01T12:53:34-0400) Patient had a min HR of 69 bpm, max HR of 129 bpm, and avg HR of 90 bpm. Predominant underlying rhythm was Sinus Rhythm. Isolated SVEs were rare (<1.0%), SVE Couplets were rare (<1.0%), and no SVE Triplets were present. Isolated VEs were frequent (8.2%,  84618), VE Couplets were rare (<1.0%, 8), and no VE Triplets were present. Ventricular Bigeminy and Trigeminy  were present.  Symptoms associated with frequent PVCs. Conclusion: Symptomatic frequent PVCs.  TTE 09/22/2021 IMPRESSIONS     1. Left ventricular ejection fraction, by estimation, is 60 to 65%. The  left ventricle has normal function. The left ventricle has no regional  wall motion abnormalities. Left ventricular diastolic parameters were  normal.   2. Right ventricular systolic function is normal. The right ventricular  size is normal.   3. The mitral valve is normal in structure. Trivial mitral valve  regurgitation. No evidence of mitral stenosis.   4. The aortic valve is normal in structure. Aortic valve regurgitation is  not visualized. No aortic stenosis is present.   5. The inferior vena cava is normal in size with greater than 50%  respiratory variability, suggesting right atrial pressure of 3 mmHg.   FINDINGS   Left Ventricle: Left ventricular ejection fraction, by estimation, is 60  to 65%. The left ventricle has normal function. The left ventricle has no  regional wall motion abnormalities. The left ventricular internal cavity  size was normal in size. There is   no left ventricular hypertrophy. Left ventricular diastolic parameters  were normal.   Right Ventricle: The right ventricular size is normal. No increase in  right ventricular wall thickness. Right ventricular systolic function is  normal.   Left Atrium: Left atrial size was normal in size.   Right Atrium: Right atrial size was normal in size.   Pericardium: There is no evidence of pericardial effusion.   Mitral Valve: The mitral valve is normal in structure. Trivial mitral  valve regurgitation. No evidence of mitral valve stenosis.   Tricuspid Valve: The tricuspid valve is normal in structure. Tricuspid  valve regurgitation is not demonstrated. No evidence of tricuspid  stenosis.   Aortic Valve: The aortic valve is normal in structure. Aortic valve  regurgitation is not visualized. No aortic stenosis  is present.   Pulmonic Valve: The pulmonic valve was normal in structure. Pulmonic valve  regurgitation is not visualized. No evidence of pulmonic stenosis.   Aorta: The aortic root is normal in size and structure.   Venous: The inferior vena cava is normal in size with greater than 50%  respiratory variability, suggesting right atrial pressure of 3 mmHg.   IAS/Shunts: No atrial level shunt detected by color flow Doppler.       Recent Labs: 09/25/2021: ALT 15; BUN 7; Creatinine, Ser 0.50; Magnesium 1.9; Potassium 4.4; Sodium 138 11/21/2021: Hemoglobin 11.6; Platelets 268  Recent Lipid  Panel    Component Value Date/Time   CHOL 188 09/30/2020 1811   TRIG 181 (H) 09/30/2020 1811   HDL 53 09/30/2020 1811   CHOLHDL 3.5 09/30/2020 1811   VLDL 36 09/30/2020 1811   LDLCALC 99 09/30/2020 1811   LDLCALC 115 (H) 11/01/2017 1051    Physical Exam:    VS:  BP 116/82   Pulse 90   Ht '5\' 3"'  (1.6 m)   Wt 225 lb 6.4 oz (102.2 kg)   LMP 05/03/2021   SpO2 96%   BMI 39.93 kg/m     Wt Readings from Last 3 Encounters:  11/29/21 228 lb 3.2 oz (103.5 kg)  11/24/21 225 lb 6.4 oz (102.2 kg)  11/21/21 223 lb (101.2 kg)     GEN: Well nourished, well developed in no acute distress HEENT: Normal NECK: No JVD; No carotid bruits LYMPHATICS: No lymphadenopathy CARDIAC: S1S2 noted,RRR, no murmurs, rubs, gallops RESPIRATORY:  Clear to auscultation without rales, wheezing or rhonchi  ABDOMEN: Soft, non-tender, non-distended, +bowel sounds, no guarding. EXTREMITIES: No edema, No cyanosis, no clubbing MUSCULOSKELETAL:  No deformity  SKIN: Warm and dry NEUROLOGIC:  Alert and oriented x 3, non-focal PSYCHIATRIC:  Normal affect, good insight  ASSESSMENT:    1. Frequent PVCs   2. Chronic hypertension affecting pregnancy   3. Symptomatic PVCs   4. Obesity in pregnancy    PLAN:     Is here today to discuss her monitor results which show evidence of frequent PVCs.  She had been taken off the  labetalol because she had some transient low blood pressure.  She is symptomatic I think is best for [place] to start the patient back on beta-blocker low-dose propanolol at nighttime will be started.  Her blood pressure is at target today.  The patient is in agreement with the above plan. The patient left the office in stable condition.  The patient will follow up in   Medication Adjustments/Labs and Tests Ordered: Current medicines are reviewed at length with the patient today.  Concerns regarding medicines are outlined above.  No orders of the defined types were placed in this encounter.  Meds ordered this encounter  Medications   propranolol (INDERAL) 10 MG tablet    Sig: Take 1 tablet (10 mg total) by mouth daily.    Dispense:  90 tablet    Refill:  3    Patient Instructions  Medication Instructions:  Your physician has recommended you make the following change in your medication:  START: Propranolol 10 mg once daily *If you need a refill on your cardiac medications before your next appointment, please call your pharmacy*   Lab Work: None If you have labs (blood work) drawn today and your tests are completely normal, you will receive your results only by: [place] (if you have MyChart) OR A paper copy in the mail If you have any lab test that is abnormal or we need to change your treatment, we will call you to review the results.   Testing/Procedures: None   Follow-Up: At [facility], you and your health needs are our priority.  As part of our continuing mission to provide you with exceptional heart care, we have created designated Provider Care Teams.  These Care Teams include your primary Cardiologist (physician) and Advanced Practice Providers (APPs -  Physician Assistants and Nurse Practitioners) who all work together to provide you with the care you need, when you need it.  We recommend signing up for the patient portal called "  MyChart".  Sign up information is  provided on this After Visit Summary.  MyChart is used to connect with patients for Virtual Visits (Telemedicine).  Patients are able to view lab/test results, encounter notes, upcoming appointments, etc.  Non-urgent messages can be sent to your provider as well.   To learn more about what you can do with MyChart, go to [email or web address].    Your next appointment:   01/15/2022 at 11:00am   The format for your next appointment:   In Person  Provider:   Berniece Salines, DO   Other Instructions   Important Information About Sugar         Adopting a Healthy Lifestyle.  Know what a healthy weight is for you (roughly BMI <25) and aim to maintain this   Aim for 7+ servings of fruits and vegetables daily   65-80+ fluid ounces of water or unsweet tea for healthy kidneys   Limit to max 1 drink of alcohol per day; avoid smoking/tobacco   Limit animal fats in diet for cholesterol and heart health - choose grass fed whenever available   Avoid highly processed foods, and foods high in saturated/trans fats   Aim for low stress - take time to unwind and care for your mental health   Aim for 150 min of moderate intensity exercise weekly for heart health, and weights twice weekly for bone health   Aim for 7-9 hours of sleep daily   When it comes to diets, agreement about the perfect plan isnt easy to find, even among the experts. Experts at the [employer] [place] developed an idea known as the Healthy Eating Plate. Just imagine a plate divided into logical, healthy portions.   The emphasis is on diet quality:   Load up on vegetables and fruits - one-half of your plate: Aim for color and variety, and remember that potatoes dont count.   Go for whole grains - one-quarter of your plate: Whole wheat, barley, wheat berries, quinoa, oats, brown rice, and foods made with them. If you want pasta, go with whole wheat pasta.   Protein power - one-quarter of your plate: Fish,  chicken, beans, and nuts are all healthy, versatile protein sources. Limit red meat.   The diet, however, does go beyond the plate, offering a few other suggestions.   Use healthy plant oils, such as olive, canola, soy, corn, sunflower and peanut. Check the labels, and avoid partially hydrogenated oil, which have unhealthy trans fats.   If youre thirsty, drink water. Coffee and tea are good in moderation, but skip sugary drinks and limit milk and dairy products to one or two daily servings.   The type of carbohydrate in the diet is more important than the amount. Some sources of carbohydrates, such as vegetables, fruits, whole grains, and beans-are healthier than others.   Finally, stay active  Signed, Berniece Salines, DO  11/30/2021 7:29 PM    [facility]

## 2021-11-24 NOTE — Patient Instructions (Addendum)
Medication Instructions:  Your physician has recommended you make the following change in your medication:  START: Propranolol 10 mg once daily *If you need a refill on your cardiac medications before your next appointment, please call your pharmacy*   Lab Work: None If you have labs (blood work) drawn today and your tests are completely normal, you will receive your results only by: MyChart Message (if you have MyChart) OR A paper copy in the mail If you have any lab test that is abnormal or we need to change your treatment, we will call you to review the results.   Testing/Procedures: None   Follow-Up: At [facility], you and your health needs are our priority.  As part of our continuing mission to provide you with exceptional heart care, we have created designated Provider Care Teams.  These Care Teams include your primary Cardiologist (physician) and Advanced Practice Providers (APPs -  Physician Assistants and Nurse Practitioners) who all work together to provide you with the care you need, when you need it.  We recommend signing up for the patient portal called "MyChart".  Sign up information is provided on this After Visit Summary.  MyChart is used to connect with patients for Virtual Visits (Telemedicine).  Patients are able to view lab/test results, encounter notes, upcoming appointments, etc.  Non-urgent messages can be sent to your provider as well.   To learn more about what you can do with MyChart, go to [email or web address].    Your next appointment:   01/15/2022 at 11:00am   The format for your next appointment:   In Person  Provider:   Thomasene Ripple, DO   Other Instructions   Important Information About Sugar

## 2021-11-28 ENCOUNTER — Other Ambulatory Visit: Payer: Self-pay | Admitting: Obstetrics & Gynecology

## 2021-11-28 DIAGNOSIS — O10919 Unspecified pre-existing hypertension complicating pregnancy, unspecified trimester: Secondary | ICD-10-CM

## 2021-11-28 DIAGNOSIS — O4403 Placenta previa specified as without hemorrhage, third trimester: Secondary | ICD-10-CM

## 2021-11-28 DIAGNOSIS — O24419 Gestational diabetes mellitus in pregnancy, unspecified control: Secondary | ICD-10-CM

## 2021-11-29 ENCOUNTER — Ambulatory Visit (INDEPENDENT_AMBULATORY_CARE_PROVIDER_SITE_OTHER): Payer: Medicaid Other | Admitting: Obstetrics & Gynecology

## 2021-11-29 ENCOUNTER — Ambulatory Visit (INDEPENDENT_AMBULATORY_CARE_PROVIDER_SITE_OTHER): Payer: Medicaid Other

## 2021-11-29 ENCOUNTER — Encounter: Payer: Self-pay | Admitting: Obstetrics & Gynecology

## 2021-11-29 VITALS — BP 118/85 | HR 92 | Wt 228.2 lb

## 2021-11-29 DIAGNOSIS — O4403 Placenta previa specified as without hemorrhage, third trimester: Secondary | ICD-10-CM | POA: Diagnosis not present

## 2021-11-29 DIAGNOSIS — O0992 Supervision of high risk pregnancy, unspecified, second trimester: Secondary | ICD-10-CM

## 2021-11-29 DIAGNOSIS — O10919 Unspecified pre-existing hypertension complicating pregnancy, unspecified trimester: Secondary | ICD-10-CM

## 2021-11-29 DIAGNOSIS — O24419 Gestational diabetes mellitus in pregnancy, unspecified control: Secondary | ICD-10-CM

## 2021-11-29 DIAGNOSIS — Z3A29 29 weeks gestation of pregnancy: Secondary | ICD-10-CM

## 2021-11-29 DIAGNOSIS — O24410 Gestational diabetes mellitus in pregnancy, diet controlled: Secondary | ICD-10-CM

## 2021-11-29 DIAGNOSIS — O4402 Placenta previa specified as without hemorrhage, second trimester: Secondary | ICD-10-CM

## 2021-11-29 NOTE — Progress Notes (Signed)
[place] TA/TV: 29 wks,cephalic,marginal anterior placenta previa gr 2,cx 3.5 cm,AFI 12.9 cm,FHR 142 bpm,right renal pelvic dilatation 7.5 mm,left renal pelvis 4.5 mm,EFW 1217 g 19%,FL 1.5%

## 2021-11-29 NOTE — Progress Notes (Signed)
HIGH-RISK PREGNANCY VISIT Patient name: [name] MRN [id number]  Date of birth: 05/11/1980 Chief Complaint:   Routine Prenatal Visit  History of Present Illness:   [name] is a 41 y.o. [id number] female at 42w0dwith an Estimated Date of Delivery: 02/14/22 being seen today for ongoing management of a high-risk pregnancy complicated by:  -Placenta previa- asymptomatic -Chronic HTN- no longer taking labetalol- was having low BPs -T2DM- stopped metformin about a week ago Initially placed s/p BMZ -Heart palpitations- seen by Dr. THarriet Masson started on propranolol at night   Today she reports no complaints.   Contractions: Not present. Vag. Bleeding: None.  Movement: Present. denies leaking of fluid.      08/03/2021   10:21 AM 01/19/2020   10:54 AM 12/31/2017    8:59 AM 11/01/2017   10:06 AM 09/19/2017    8:38 AM  Depression screen PHQ 2/9  Decreased Interest 0 0 0 0 0  Down, Depressed, Hopeless 1 0 0 0 1  PHQ - 2 Score 1 0 0 0 1  Altered sleeping 0 0 0 0 0  Tired, decreased energy 1 0 1 0 0  Change in appetite 0 0     Feeling bad or failure about yourself  0 1 0 1 0  Trouble concentrating 0 0 0 0 0  Moving slowly or fidgety/restless 0 0     Suicidal thoughts 0 0 0 0 0  PHQ-9 Score '2 1 1 1 1     ' Current Outpatient Medications  Medication Instructions   Accu-Chek FastClix Lancets MISC Use as directed to check blood sugar 4 times daily   aspirin EC 162 mg, Oral, Daily, Swallow whole.   azelastine (ASTELIN) 0.1 % nasal spray 1 spray, Each Nare, As needed, Use in each nostril as directed   Blood Glucose Monitoring Suppl (ACCU-CHEK GUIDE ME) w/Device KIT 1 each, Does not apply, 4 times daily   Blood Pressure Monitor MISC For regular home bp monitoring during pregnancy   glucose blood (ACCU-CHEK GUIDE) test strip Use as instructed to check blood sugar 4 times daily   labetalol (NORMODYNE) 200 mg, Oral, 2 times daily   loratadine (CLARITIN) 10 mg, Oral, Daily PRN   metFORMIN  (GLUCOPHAGE) 500 mg, Oral, 2 times daily with meals   Pediatric Multivitamins-Iron (FLINTSTONES COMPLETE PO) 2 tablets, Oral   propranolol (INDERAL) 10 mg, Oral, Daily     Review of Systems:   Pertinent items are noted in HPI Denies abnormal vaginal discharge w/ itching/odor/irritation, headaches, visual changes, shortness of breath, chest pain, abdominal pain, severe nausea/vomiting, or problems with urination or bowel movements unless otherwise stated above. Pertinent History Reviewed:  Reviewed past medical,surgical, social, obstetrical and family history.  Reviewed problem list, medications and allergies. Physical Assessment:   Vitals:   11/29/21 1510  BP: 118/85  Pulse: 92  Weight: 228 lb 3.2 oz (103.5 kg)  Body mass index is 40.42 kg/m.           Physical Examination:   General appearance: alert, well appearing, and in no distress  Mental status: normal mood, behavior, speech, dress, motor activity, and thought processes  Skin: warm & dry   Extremities: Edema: None    Cardiovascular: normal heart rate noted  Respiratory: normal respiratory effort, no distress  Abdomen: gravid, soft, non-tender  Pelvic: Cervical exam deferred         Fetal Status:     Movement: Present    Fetal Surveillance Testing today: ,cephalic,marginal anterior placenta previa  gr 2,cx 3.5 cm,AFI 12.9 cm,FHR 142 bpm,right renal pelvic dilatation 7.5 mm,left renal pelvis 4.5 mm,EFW 1217 g 19%,FL 1.5%   Chaperone: N/A    No results found for this or any previous visit (from the past 24 hour(s)).   Assessment & Plan:  High-risk pregnancy: [id number] at 60w0dwith an Estimated Date of Delivery: 02/14/22   1) Marginal placenta previa -reviewed [place]- pt aware that C-section may be warranted if <2cm from cervical os -will continue to follow by [place] 2) Chronic HTN -off Labetalol for BP management; however on low dose beta blocker for heart palpations  3) T2DM vs GDMA1 -off metformin for one  week -sugars mostly within normal range, only occasional elevated -will continue to closely monitor  4) Renal pelvic dilation- previously noted, will continue to follow  Meds: No orders of the defined types were placed in this encounter.   Labs/procedures today: growth scan  Treatment Plan:  antepartum testing to start @ 32wks  Reviewed: Preterm labor symptoms and general obstetric precautions including but not limited to vaginal bleeding, contractions, leaking of fluid and fetal movement were reviewed in detail with the patient.  All questions were answered. Pt has home bp cuff. Check bp weekly, let [place] if >140/90.   Follow-up: No follow-ups on file.   Future Appointments  Date Time Provider DNordic 12/18/2021 10:50 AM [facility] NURSE [facility]  12/21/2021  2:30 PM [facility] NURSE [facility]  12/21/2021  2:50 PM OJanyth Pupa DO [facility]TO[facility]  12/25/2021  2:30 PM [facility] NURSE [facility]  12/28/2021 10:00 AM [facility] - FTOBGYN [place] None  12/28/2021 10:50 AM EFlorian Buff MD [facility]  01/01/2022  2:30 PM [facility] NURSE [facility]  01/04/2022  2:15 PM [place]- FTOBGYN [place]FT[facility] None  01/04/2022  3:10 PM EFlorian Buff MD [facility]TO[facility]  01/08/2022  2:50 PM [facility]-FTOBGYN NURSE [facility]TO[facility]  01/11/2022  1:30 PM [place]- FTOBGYN [place]-FTIMG None  01/11/2022  2:30 PM OJanyth Pupa DO [facility]TOBG[facility]  01/15/2022 11:00 AM Tobb, Kardie, DO [facility]  01/15/2022  2:30 PM [facility]OBGY[facility] NURSE [facility]TO[facility]  01/18/2022  1:30 PM [place]- FTOBGYN [place]-FTIMG None  01/18/2022  2:30 PM OJanyth Pupa DO [facility]TO[facility]  01/22/2022  2:50 PM [facility]FT[facility]GY[facility] NURSE [facility]  01/25/2022 10:00 AM [facility] - FTOBGYN [place]-FT[facility]G None  01/25/2022 10:50 AM EFlorian Buff MD [facility]TO[facility]  01/29/2022  3:10 PM [facility]-FTOBGYN NURSE [facility]TO[facility]  02/01/2022  1:30 PM [place]- FTOBGYN [place]-FTIMG None  02/01/2022  2:30 PM OJanyth Pupa DO [facility]TO[facility]   02/05/2022  3:10 PM C[facility]-FTOBGYN NURSE [facility]TOBG[facility]  02/08/2022  1:30 PM [place] FTO[facility]YN [place]FT[facility]G None  02/08/2022  2:30 PM EFlorian Buff MD [facility]TOBG[facility]  02/12/2022  3:10 PM [facility]FTOBGYN NURSE [facility]TO[facility]    No orders of the defined types were placed in this encounter.   JJanyth Pupa DO Attending OD[facility] Centerfor [facility] [facility] [place]onne [place]

## 2021-12-18 ENCOUNTER — Encounter: Payer: Self-pay | Admitting: Obstetrics & Gynecology

## 2021-12-18 ENCOUNTER — Other Ambulatory Visit: Payer: Self-pay | Admitting: Obstetrics & Gynecology

## 2021-12-18 ENCOUNTER — Ambulatory Visit (INDEPENDENT_AMBULATORY_CARE_PROVIDER_SITE_OTHER): Payer: Medicaid Other | Admitting: *Deleted

## 2021-12-18 VITALS — BP 133/92 | HR 90 | Wt 230.0 lb

## 2021-12-18 DIAGNOSIS — O10919 Unspecified pre-existing hypertension complicating pregnancy, unspecified trimester: Secondary | ICD-10-CM

## 2021-12-18 DIAGNOSIS — O4402 Placenta previa specified as without hemorrhage, second trimester: Secondary | ICD-10-CM

## 2021-12-18 DIAGNOSIS — O0993 Supervision of high risk pregnancy, unspecified, third trimester: Secondary | ICD-10-CM

## 2021-12-18 DIAGNOSIS — O24410 Gestational diabetes mellitus in pregnancy, diet controlled: Secondary | ICD-10-CM

## 2021-12-18 DIAGNOSIS — Z3A31 31 weeks gestation of pregnancy: Secondary | ICD-10-CM | POA: Diagnosis not present

## 2021-12-18 LAB — POCT URINALYSIS DIPSTICK OB
Blood, UA: NEGATIVE
Glucose, UA: NEGATIVE
Ketones, UA: NEGATIVE
Nitrite, UA: NEGATIVE
POC,PROTEIN,UA: NEGATIVE

## 2021-12-18 MED ORDER — NIFEDIPINE ER OSMOTIC RELEASE 30 MG PO TB24: 30.0000 mg | ORAL_TABLET | Freq: Every day | ORAL | 6 refills | Status: DC

## 2021-12-18 NOTE — Progress Notes (Signed)
Rx sent in for procardia

## 2021-12-18 NOTE — Progress Notes (Signed)
   NURSE VISIT- NST  SUBJECTIVE:  [name] is a 41 y.o. [id number] female at [redacted]w[redacted]d, here for a NST for pregnancy complicated by [facility] and A1DM.  She reports active fetal movement, contractions: none, vaginal bleeding: none, membranes: intact.   OBJECTIVE:  BP (!) 133/92   Pulse 90   Wt 230 lb (104.3 kg)   LMP 05/03/2021   BMI 40.74 kg/m   Appears well, no apparent distress  Results for orders placed or performed in visit on 12/18/21 (from the past 24 hour(s))  POC Urinalysis Dipstick OB   Collection Time: 12/18/21 12:00 PM  Result Value Ref Range   Color, UA     Clarity, UA     Glucose, UA Negative Negative   Bilirubin, UA     Ketones, UA neg    Spec Grav, UA     Blood, UA neg    pH, UA     POC,PROTEIN,UA Negative Negative, Trace, Small (1+), Moderate (2+), Large (3+), 4+   Urobilinogen, UA     Nitrite, UA neg    Leukocytes, UA Small (1+) (A) Negative   Appearance     Odor      NST: FHR baseline 135 bpm, Variability: moderate, Accelerations:present, Decelerations:  Absent= Cat 1/reactive Toco: none   ASSESSMENT: [id number] at [redacted]w[redacted]d with CHTN and A1DM NST reactive  PLAN: EFM strip reviewed by Myna Hidalgo, MD Will send in Procardia d/t increase in blood pressure Recommendations: keep next appointment as scheduled    Debbe Odea Cassia Fein  12/18/2021 12:00 PM

## 2021-12-20 ENCOUNTER — Other Ambulatory Visit: Payer: Self-pay | Admitting: Obstetrics & Gynecology

## 2021-12-20 DIAGNOSIS — O10919 Unspecified pre-existing hypertension complicating pregnancy, unspecified trimester: Secondary | ICD-10-CM

## 2021-12-20 DIAGNOSIS — O4420 Partial placenta previa NOS or without hemorrhage, unspecified trimester: Secondary | ICD-10-CM

## 2021-12-20 DIAGNOSIS — O24410 Gestational diabetes mellitus in pregnancy, diet controlled: Secondary | ICD-10-CM

## 2021-12-20 DIAGNOSIS — O09523 Supervision of elderly multigravida, third trimester: Secondary | ICD-10-CM

## 2021-12-21 ENCOUNTER — Encounter: Payer: Self-pay | Admitting: Obstetrics & Gynecology

## 2021-12-21 ENCOUNTER — Other Ambulatory Visit: Payer: Medicaid Other

## 2021-12-21 ENCOUNTER — Ambulatory Visit (INDEPENDENT_AMBULATORY_CARE_PROVIDER_SITE_OTHER): Payer: Medicaid Other | Admitting: Obstetrics & Gynecology

## 2021-12-21 ENCOUNTER — Ambulatory Visit (INDEPENDENT_AMBULATORY_CARE_PROVIDER_SITE_OTHER): Payer: Medicaid Other

## 2021-12-21 VITALS — BP 125/83 | HR 88 | Wt 229.6 lb

## 2021-12-21 DIAGNOSIS — O09523 Supervision of elderly multigravida, third trimester: Secondary | ICD-10-CM

## 2021-12-21 DIAGNOSIS — O4420 Partial placenta previa NOS or without hemorrhage, unspecified trimester: Secondary | ICD-10-CM

## 2021-12-21 DIAGNOSIS — O4402 Placenta previa specified as without hemorrhage, second trimester: Secondary | ICD-10-CM

## 2021-12-21 DIAGNOSIS — Z3A32 32 weeks gestation of pregnancy: Secondary | ICD-10-CM

## 2021-12-21 DIAGNOSIS — O10919 Unspecified pre-existing hypertension complicating pregnancy, unspecified trimester: Secondary | ICD-10-CM | POA: Diagnosis not present

## 2021-12-21 DIAGNOSIS — O4440 Low lying placenta NOS or without hemorrhage, unspecified trimester: Secondary | ICD-10-CM

## 2021-12-21 DIAGNOSIS — O24410 Gestational diabetes mellitus in pregnancy, diet controlled: Secondary | ICD-10-CM

## 2021-12-21 DIAGNOSIS — O0993 Supervision of high risk pregnancy, unspecified, third trimester: Secondary | ICD-10-CM

## 2021-12-21 DIAGNOSIS — O365990 Maternal care for other known or suspected poor fetal growth, unspecified trimester, not applicable or unspecified: Secondary | ICD-10-CM | POA: Insufficient documentation

## 2021-12-21 MED ORDER — AMLODIPINE BESYLATE 5 MG PO TABS: 5.0000 mg | ORAL_TABLET | Freq: Every day | ORAL | 6 refills | Status: DC

## 2021-12-21 NOTE — Progress Notes (Signed)
HIGH-RISK PREGNANCY VISIT Patient name: [name] MRN [id number]  Date of birth: Oct 08, 1980 Chief Complaint:   Routine Prenatal Visit  History of Present Illness:   [name] is a 41 y.o. [id number] female at 82w1dwith an Estimated Date of Delivery: 02/14/22 being seen today for ongoing management of a high-risk pregnancy complicated by:  -low lying placenta -Chronic HTN Unable to take Procardia since she could not break the pill -GDMA1 -Anxiety -RH neg  Today she reports  change in fetal movement- feels like it's less than what it used to be- reviewed fetal kick counts .   Contractions: Not present. Vag. Bleeding: None.  Movement: Present. denies leaking of fluid.      08/03/2021   10:21 AM 01/19/2020   10:54 AM 12/31/2017    8:59 AM 11/01/2017   10:06 AM 09/19/2017    8:38 AM  Depression screen PHQ 2/9  Decreased Interest 0 0 0 0 0  Down, Depressed, Hopeless 1 0 0 0 1  PHQ - 2 Score 1 0 0 0 1  Altered sleeping 0 0 0 0 0  Tired, decreased energy 1 0 1 0 0  Change in appetite 0 0     Feeling bad or failure about yourself  0 1 0 1 0  Trouble concentrating 0 0 0 0 0  Moving slowly or fidgety/restless 0 0     Suicidal thoughts 0 0 0 0 0  PHQ-9 Score '2 1 1 1 1     ' Current Outpatient Medications  Medication Instructions   Accu-Chek FastClix Lancets MISC Use as directed to check blood sugar 4 times daily   amLODipine (NORVASC) 5 mg, Oral, Daily   aspirin EC 162 mg, Oral, Daily, Swallow whole.   azelastine (ASTELIN) 0.1 % nasal spray 1 spray, Each Nare, As needed, Use in each nostril as directed   Blood Glucose Monitoring Suppl (ACCU-CHEK GUIDE ME) w/Device KIT 1 each, Does not apply, 4 times daily   Blood Pressure Monitor MISC For regular home bp monitoring during pregnancy   glucose blood (ACCU-CHEK GUIDE) test strip Use as instructed to check blood sugar 4 times daily   labetalol (NORMODYNE) 200 mg, Oral, 2 times daily   loratadine (CLARITIN) 10 mg, Oral, Daily  PRN   metFORMIN (GLUCOPHAGE) 500 mg, Oral, 2 times daily with meals   Pediatric Multivitamins-Iron (FLINTSTONES COMPLETE PO) 2 tablets, Oral   propranolol (INDERAL) 10 mg, Oral, Daily     Review of Systems:   Pertinent items are noted in HPI Denies abnormal vaginal discharge w/ itching/odor/irritation, headaches, visual changes, shortness of breath, chest pain, abdominal pain, severe nausea/vomiting, or problems with urination or bowel movements unless otherwise stated above. Pertinent History Reviewed:  Reviewed past medical,surgical, social, obstetrical and family history.  Reviewed problem list, medications and allergies. Physical Assessment:   Vitals:   12/21/21 1504  BP: 125/83  Pulse: 88  Weight: 229 lb 9.6 oz (104.1 kg)  Body mass index is 40.67 kg/m.           Physical Examination:   General appearance: alert, well appearing, and in no distress  Mental status: normal mood, behavior, speech, dress, motor activity, and thought processes  Skin: warm & dry   Extremities: Edema: None    Cardiovascular: normal heart rate noted  Respiratory: normal respiratory effort, no distress  Abdomen: gravid, soft, non-tender  Pelvic: Cervical exam deferred         Fetal Status:     Movement: Present  Fetal Surveillance Testing today: cephalic,BPP 3/7,DSKAJGOT low lying placenta gr 2,tip of placenta to CX 1.8 cm,cord appears to be 1 cm from internal OS,elevated umbilical artery dopplers with EDF,RI .72,.77,.76,.74=96%,EFW 1543 g 4.7%,AC 7%,will repeat TV @ 35 wks to ck placenta and cord location,per Dr.Luci Bellucci   Chaperone: N/A    No results found for this or any previous visit (from the past 24 hour(s)).   Assessment & Plan:  High-risk pregnancy: [id number] at 37w1dwith an Estimated Date of Delivery: 02/14/22   1) FGR -growth previously 19% on 8/23, now decreased to 5% -continue with dopplers weekly and twice weekly testing -Elevated UAD, plan for IOL @ 37wks or as clinically  indicated  2) Low lying placenta -reviewed with patient that ideally placenta 2cm from edge, currently 1.8cm -may consider labor with precautions -will plan for TVUS around 35-36wk to re-evaluate  3) Chronic HTN -transition from procardia to Norvasc as pill is much smaller -currently on propranolol for heart palpitations -BP appropriate, has BP cuff  4) GDMA1 Well controlled with diet  5) Rh neg 6) anxiety- no meds currently   Meds:  Meds ordered this encounter  Medications   amLODipine (NORVASC) 5 MG tablet    Sig: Take 1 tablet (5 mg total) by mouth daily.    Dispense:  30 tablet    Refill:  6    Labs/procedures today: BPP/doppler/growth  Treatment Plan:  as outlined above  Reviewed: Preterm labor symptoms and general obstetric precautions including but not limited to vaginal bleeding, contractions, leaking of fluid and fetal movement were reviewed in detail with the patient.  Should she note any concerns regarding movement, pt encouraged to go directly to MAU.  All questions were answered. Pt has home bp cuff. Check bp weekly, let u[place]ore[place]now if >140/90.   Follow-up: Return for as scheduled .   Future Appointments  Date Time Provider DAckerly 12/25/2021  2:30 PM C[facility]-FTOBGYN NURSE [facility]-FT FTOBGYN  12/28/2021  1:30 PM [place]- FTOBGYN [place]-FTIMG None  12/28/2021  2:30 PM EFlorian Buff MD [facility]-FT FTOBGYN  01/01/2022  9:50 AM C[facility]-FTOBGYN NURSE C[facility]-FT FTOBGYN  01/04/2022  2:15 PM [place]- FTOBGYN [place]-FTIMG None  01/04/2022  3:10 PM EFlorian Buff MD C[facility]-FT FTOBGYN  01/08/2022  2:50 PM C[facility]-FTOBGYN NURSE C[facility]-FT FTOBGYN  01/11/2022  1:30 PM [place]- FTOBGYN [place]-FTIMG None  01/11/2022  2:30 PM OJanyth Pupa DO [facility]-FT FTOBGYN  01/15/2022 11:00 AM Tobb, Kardie, DO [facility] None  01/15/2022  2:30 PM C[facility]-FTOBGYN NURSE C[facility]-FT FTOBGYN  01/18/2022  1:30 PM [place]- FTOBGYN [place]-FTIMG None  01/18/2022  2:30 PM OJanyth Pupa DO C[facility]-FT FTOBGYN  01/22/2022  2:50 PM C[facility]-FTOBGYN  NURSE C[facility]-FT FTOBGYN  01/25/2022 10:00 AM CWH - FTOBGYN [place]-FTIMG None  01/25/2022 10:50 AM EFlorian Buff MD [facility]-FT FTOBGYN  01/29/2022  3:10 PM CWH-FTOBGYN NURSE CWH-FT FTOBGYN  02/01/2022  1:30 PM [place]- FTOBGYN [place]-FTIMG None  02/01/2022  2:30 PM OJanyth Pupa DO C[facility]-FT FTOBGYN  02/05/2022  3:10 PM C[facility]-FTOBGYN NURSE C[facility]-FT FTOBGYN  02/08/2022  1:30 PM CWH - FTOBGYN [place]-FTIMG None  02/08/2022  2:30 PM EFlorian Buff MD C[facility]-FT FTOBGYN  02/12/2022  3:10 PM C[facility]-FTOBGYN NURSE C[facility]-FT FTOBGYN   Meds ordered this encounter  Medications   amLODipine (NORVASC) 5 MG tablet    Sig: Take 1 tablet (5 mg total) by mouth daily.    Dispense:  30 tablet    Refill:  6  Janyth Pupa, DO Attending [place], [facility] for [employer], [place]

## 2021-12-21 NOTE — Progress Notes (Signed)
[place] 32+1 wks,cephalic,BPP 8/8,anterior low lying placenta gr 2,tip of placenta to CX 1.8 cm,cord appears to be 1 cm from internal OS,elevated umbilical artery dopplers with EDF,RI .72,.77,.76,.74=96%,EFW 1543 g 4.7%,AC 7%,will repeat TV @ 35 wks to ck placenta and cord location,per Dr.Ozan

## 2021-12-24 ENCOUNTER — Other Ambulatory Visit: Payer: Self-pay

## 2021-12-24 ENCOUNTER — Encounter (HOSPITAL_COMMUNITY): Payer: Self-pay | Admitting: Family Medicine

## 2021-12-24 ENCOUNTER — Inpatient Hospital Stay (HOSPITAL_COMMUNITY)
Admission: AD | Admit: 2021-12-24 | Discharge: 2021-12-24 | Disposition: A | Discharge status: Home / Self Care | Payer: Medicaid Other | Attending: Family Medicine | Admitting: Family Medicine

## 2021-12-24 DIAGNOSIS — Z3A32 32 weeks gestation of pregnancy: Secondary | ICD-10-CM | POA: Diagnosis not present

## 2021-12-24 DIAGNOSIS — I10 Essential (primary) hypertension: Secondary | ICD-10-CM

## 2021-12-24 DIAGNOSIS — O10913 Unspecified pre-existing hypertension complicating pregnancy, third trimester: Secondary | ICD-10-CM | POA: Insufficient documentation

## 2021-12-24 DIAGNOSIS — O09523 Supervision of elderly multigravida, third trimester: Secondary | ICD-10-CM | POA: Insufficient documentation

## 2021-12-24 DIAGNOSIS — O0943 Supervision of pregnancy with grand multiparity, third trimester: Secondary | ICD-10-CM | POA: Insufficient documentation

## 2021-12-24 DIAGNOSIS — O0993 Supervision of high risk pregnancy, unspecified, third trimester: Secondary | ICD-10-CM

## 2021-12-24 DIAGNOSIS — O26893 Other specified pregnancy related conditions, third trimester: Secondary | ICD-10-CM | POA: Diagnosis not present

## 2021-12-24 DIAGNOSIS — G4489 Other headache syndrome: Secondary | ICD-10-CM | POA: Diagnosis not present

## 2021-12-24 DIAGNOSIS — R1084 Generalized abdominal pain: Secondary | ICD-10-CM | POA: Diagnosis not present

## 2021-12-24 DIAGNOSIS — O24113 Pre-existing diabetes mellitus, type 2, in pregnancy, third trimester: Secondary | ICD-10-CM | POA: Diagnosis not present

## 2021-12-24 DIAGNOSIS — O4402 Placenta previa specified as without hemorrhage, second trimester: Secondary | ICD-10-CM

## 2021-12-24 DIAGNOSIS — R519 Headache, unspecified: Secondary | ICD-10-CM | POA: Diagnosis not present

## 2021-12-24 DIAGNOSIS — Z3689 Encounter for other specified antenatal screening: Secondary | ICD-10-CM

## 2021-12-24 DIAGNOSIS — R42 Dizziness and giddiness: Secondary | ICD-10-CM | POA: Diagnosis not present

## 2021-12-24 LAB — CBC
HCT: 36.6 % (ref 36.0–46.0)
Hemoglobin: 12.3 g/dL (ref 12.0–15.0)
MCH: 29.1 pg (ref 26.0–34.0)
MCHC: 33.6 g/dL (ref 30.0–36.0)
MCV: 86.5 fL (ref 80.0–100.0)
Platelets: 281 10*3/uL (ref 150–400)
RBC: 4.23 MIL/uL (ref 3.87–5.11)
RDW: 14.4 % (ref 11.5–15.5)
WBC: 9.7 10*3/uL (ref 4.0–10.5)
nRBC: 0 % (ref 0.0–0.2)

## 2021-12-24 LAB — COMPREHENSIVE METABOLIC PANEL
ALT: 17 U/L (ref 0–44)
AST: 19 U/L (ref 15–41)
Albumin: 2.8 g/dL — ABNORMAL LOW (ref 3.5–5.0)
Alkaline Phosphatase: 63 U/L (ref 38–126)
Anion gap: 10 (ref 5–15)
BUN: 13 mg/dL (ref 6–20)
CO2: 22 mmol/L (ref 22–32)
Calcium: 9.9 mg/dL (ref 8.9–10.3)
Chloride: 105 mmol/L (ref 98–111)
Creatinine, Ser: 0.56 mg/dL (ref 0.44–1.00)
GFR, Estimated: >60 mL/min (ref >60)
Glucose, Bld: 82 mg/dL (ref 70–99)
Potassium: 4.3 mmol/L (ref 3.5–5.1)
Sodium: 137 mmol/L (ref 135–145)
Total Bilirubin: 0.7 mg/dL (ref 0.3–1.2)
Total Protein: 6.3 g/dL — ABNORMAL LOW (ref 6.5–8.1)

## 2021-12-24 LAB — PROTEIN / CREATININE RATIO, URINE
Creatinine, Urine: 24 mg/dL
Total Protein, Urine: <6 mg/dL

## 2021-12-24 NOTE — MAU Provider Note (Signed)
History     CSN: [id number]  Arrival date and time: 12/24/21 1640   Event Date/Time   First Provider Initiated Contact with Patient 12/24/21 1646      Chief Complaint  Patient presents with   Dizziness   Headache   HPI [name] is a 41 y.o. [id number] at 24w4dwho presents to MAU via EMS with chief complaint of dizziness and headache. These are new problems, onset around 1430 today. Patient reported headache 6/10 to MAU Triage RN, denied headache on CNM initial assessment then reported her headache had resolved without intervention when RN conducted discharge teaching. She denies contractions, vaginal bleeding, DFM, fever or recent illness.  Patient's pregnancy is c/b Chronic Hypertension on Norvasc 5. She reports taking her medication between 0900 and 1000 today. She denies visual disturbances, RUQ/epigastric pain, new onset swelling or weight gain.  [facility]  OB History     Gravida  9   Para  6   Term  6   Preterm  0   AB  2   Living  6      SAB  2   IAB      Ectopic      Multiple  0   Live Births  6        Obstetric Comments  Dr. FNestor LewandowskyTree.          Past Medical History:  Diagnosis Date   Anemia    Anxiety "fast hearbeat"   took Benadryl at end of pregnancy   Back pain affecting pregnancy in first trimester 05/27/2015   Calculus of gallbladder without cholecystitis without obstruction 05/31/2019   Chlamydia 08/03/2021   06/21/21 @ [facility]  Only took 3 doxycycline (d/t n/v)   POC 037/10/62 neg   Complication of anesthesia    Epidural 1 sided   Depression    During 1 pregnancy   Diabetes mellitus without complication ([place]    Endometriosis    Gallstones    Gestational diabetes    just started checking BS 03/05/12   Headache(784.0)    Hematuria 05/27/2015   History of gestational diabetes in prior pregnancy, currently pregnant 06/21/2015   History of kidney  stones    Irregular periods    Pregnancy induced hypertension    Recurrent upper respiratory infection (URI)    Mostly when pregnant   Spotting affecting pregnancy in first trimester 01/27/2015    Past Surgical History:  Procedure Laterality Date   APPENDECTOMY Right 09/03/2021   CHOLECYSTECTOMY N/A 03/20/2019   Procedure: LAPAROSCOPIC CHOLECYSTECTOMY;  Surgeon: JAviva Signs MD;  Location: [facility];  Service: General;  Laterality: N/A;   LAPAROSCOPIC APPENDECTOMY N/A 09/03/2021   Procedure: APPENDECTOMY LAPAROSCOPIC;  Surgeon: RRalene Ok MD;  [facility];  Service: General;  Laterality: N/A;   MULTIPLE TOOTH EXTRACTIONS     WISDOM TOOTH EXTRACTION      Family History  Problem Relation Age of Onset   Hypertension Mother    Anxiety disorder Mother    Depression Mother    Hypertension Father    Anxiety disorder Sister    ADD / ADHD Daughter    Asthma Daughter    ADD / ADHD Daughter    Seizures Daughter    ADD / ADHD Daughter    ADD / ADHD Daughter    Heart disease Maternal Grandmother    Cancer Maternal Grandmother        brain  Heart failure Maternal Grandmother    Breast cancer Maternal Grandmother    Cancer Paternal Grandmother        brain    Social History   Tobacco Use   Smoking status: Former    Packs/day: 0.10    Years: 0.50    Total pack years: 0.05    Types: Cigarettes    Start date: 02/19/1998    Quit date: 09/17/1998    Years since quitting: 23.2   Smokeless tobacco: Never  Vaping Use   Vaping Use: Never used  Substance Use Topics   Alcohol use: Not Currently    Comment: not while pregnant   Drug use: No    Allergies:  Allergies  Allergen Reactions   Sulfamethoxazole-Trimethoprim Swelling and Rash    Medications Prior to Admission  Medication Sig Dispense Refill Last Dose   amLODipine (NORVASC) 5 MG tablet Take 1 tablet (5 mg total) by mouth daily. 30 tablet 6 12/24/2021   aspirin 81 MG EC tablet Take 2 tablets (162 mg total) by  mouth daily. Swallow whole. 180 tablet 2 12/24/2021   azelastine (ASTELIN) 0.1 % nasal spray Place 1 spray into both nostrils as needed. Use in each nostril as directed   12/24/2021   loratadine (CLARITIN) 10 MG tablet Take 10 mg by mouth daily as needed for allergies.   12/24/2021   Pediatric Multivitamins-Iron (FLINTSTONES COMPLETE PO) Take 2 tablets by mouth.   12/23/2021   propranolol (INDERAL) 10 MG tablet Take 1 tablet (10 mg total) by mouth daily. 90 tablet 3 12/23/2021   Accu-Chek FastClix Lancets MISC Use as directed to check blood sugar 4 times daily 100 each 12    Blood Glucose Monitoring Suppl (ACCU-CHEK GUIDE ME) w/Device KIT 1 each by Does not apply route 4 (four) times daily. 1 kit 0    Blood Pressure Monitor MISC For regular home bp monitoring during pregnancy 1 each 0    glucose blood (ACCU-CHEK GUIDE) test strip Use as instructed to check blood sugar 4 times daily 50 each 12    labetalol (NORMODYNE) 200 MG tablet Take 1 tablet (200 mg total) by mouth 2 (two) times daily. (Patient not taking: Reported on 11/07/2021) 60 tablet 3    metFORMIN (GLUCOPHAGE) 500 MG tablet Take 1 tablet (500 mg total) by mouth 2 (two) times daily with a meal. (Patient not taking: Reported on 12/21/2021) 60 tablet 3     Review of Systems  Neurological:  Positive for dizziness and headaches.  All other systems reviewed and are negative.  Physical Exam   Blood pressure 123/70, pulse 92, temperature 98.5 F (36.9 C), temperature source Oral, resp. rate 19, last menstrual period 05/03/2021, SpO2 99 %.  Physical Exam Vitals and nursing note reviewed.  Constitutional:      Appearance: She is well-developed. She is obese. She is not ill-appearing.  Cardiovascular:     Rate and Rhythm: Normal rate and regular rhythm.     Heart sounds: Normal heart sounds.  Pulmonary:     Effort: Pulmonary effort is normal.     Breath sounds: Normal breath sounds.  Abdominal:     Palpations: Abdomen is soft.     Comments:  Gravid  Skin:    Capillary Refill: Capillary refill takes less than 2 seconds.  Neurological:     Mental Status: She is alert and oriented to person, place, and time.  Psychiatric:        Mood and Affect: Mood normal.  Speech: Speech normal.        Behavior: Behavior normal.     MAU Course  Procedures  MDM  --Per EMS report: NSR on ECG collected during transport --Reactive tracing: baseline 135, mod var, + accels, no decels --Toco: UI, patient declined pelvic exam to confirm cervix was visually closed  Orders Placed This Encounter  Procedures   Protein / creatinine ratio, urine   CBC   Comprehensive metabolic panel   Measure blood pressure   Discharge patient   Patient Vitals for the past 24 hrs:  BP Temp Temp src Pulse Resp SpO2  12/24/21 1846 130/86 -- -- 88 -- --  12/24/21 1830 125/74 -- -- 92 -- 98 %  12/24/21 1800 (!) 145/85 -- -- 93 -- 98 %  12/24/21 1745 128/75 -- -- 90 -- 99 %  12/24/21 1731 123/70 -- -- 92 -- --  12/24/21 1717 126/75 -- -- 94 -- --  12/24/21 1700 138/83 -- -- 93 -- 99 %  12/24/21 1655 (!) 140/96 98.5 F (36.9 C) Oral 95 19 100 %   Results for orders placed or performed during the hospital encounter of 12/24/21 (from the past 24 hour(s))  CBC     Status: None   Collection Time: 12/24/21  4:59 PM  Result Value Ref Range   WBC 9.7 4.0 - 10.5 K/uL   RBC 4.23 3.87 - 5.11 MIL/uL   Hemoglobin 12.3 12.0 - 15.0 g/dL   HCT 36.6 36.0 - 46.0 %   MCV 86.5 80.0 - 100.0 fL   MCH 29.1 26.0 - 34.0 pg   MCHC 33.6 30.0 - 36.0 g/dL   RDW 14.4 11.5 - 15.5 %   Platelets 281 150 - 400 K/uL   nRBC 0.0 0.0 - 0.2 %  Comprehensive metabolic panel     Status: Abnormal   Collection Time: 12/24/21  4:59 PM  Result Value Ref Range   Sodium 137 135 - 145 mmol/L   Potassium 4.3 3.5 - 5.1 mmol/L   Chloride 105 98 - 111 mmol/L   CO2 22 22 - 32 mmol/L   Glucose, Bld 82 70 - 99 mg/dL   BUN 13 6 - 20 mg/dL   Creatinine, Ser 0.56 0.44 - 1.00 mg/dL   Calcium  9.9 8.9 - 10.3 mg/dL   Total Protein 6.3 (L) 6.5 - 8.1 g/dL   Albumin 2.8 (L) 3.5 - 5.0 g/dL   AST 19 15 - 41 U/L   ALT 17 0 - 44 U/L   Alkaline Phosphatase 63 38 - 126 U/L   Total Bilirubin 0.7 0.3 - 1.2 mg/dL   GFR, Estimated >60 >60 mL/min   Anion gap 10 5 - 15  Protein / creatinine ratio, urine     Status: None   Collection Time: 12/24/21  5:10 PM  Result Value Ref Range   Creatinine, Urine 24 mg/dL   Total Protein, Urine <6 mg/dL   Protein Creatinine Ratio        0.00 - 0.15 mg/mg[Cre]   Assessment and Plan  --41 y.o. B7C4888 at [redacted]w[redacted]d --Reactive tracing --CHTN on Norvasc 5 mg --PEC labs WNL --Inconsistent report of headache --Pain score 0/10 at time of discharge --Discharge home in stable condition  SDarlina Rumpf MSA, MSN, CNM 12/24/2021, 7:18 PM

## 2021-12-24 NOTE — MAU Note (Signed)
Aline Wesche is a 41 y.o. at [redacted]w[redacted]d here via EMS in MAU reporting: she began experiencing dizziness today @ 1445.  Reports has had a H/A since this morning upon waking up.  Hasn't taken any meds to treat.   Denies VB or LOF.  Reports +FM. LMP: N/A Onset of complaint: today Pain score: 6 Vitals:   12/24/21 1655 12/24/21 1700  BP: (!) 140/96 138/83  Pulse: 95 93  Resp: 19   Temp: 98.5 F (36.9 C)   SpO2: 100% 99%     FHT:150 bpm Lab orders placed from triage:   UA

## 2021-12-25 ENCOUNTER — Ambulatory Visit (INDEPENDENT_AMBULATORY_CARE_PROVIDER_SITE_OTHER): Payer: Medicaid Other | Admitting: *Deleted

## 2021-12-25 VITALS — BP 137/81

## 2021-12-25 DIAGNOSIS — O09523 Supervision of elderly multigravida, third trimester: Secondary | ICD-10-CM

## 2021-12-25 DIAGNOSIS — O4402 Placenta previa specified as without hemorrhage, second trimester: Secondary | ICD-10-CM

## 2021-12-25 DIAGNOSIS — Z3A32 32 weeks gestation of pregnancy: Secondary | ICD-10-CM

## 2021-12-25 DIAGNOSIS — O365990 Maternal care for other known or suspected poor fetal growth, unspecified trimester, not applicable or unspecified: Secondary | ICD-10-CM

## 2021-12-25 DIAGNOSIS — O0993 Supervision of high risk pregnancy, unspecified, third trimester: Secondary | ICD-10-CM

## 2021-12-25 DIAGNOSIS — O24410 Gestational diabetes mellitus in pregnancy, diet controlled: Secondary | ICD-10-CM

## 2021-12-25 NOTE — Progress Notes (Cosign Needed Addendum)
   NURSE VISIT- NST  SUBJECTIVE:  Aerilyn Slee is a 41 y.o. [phone] female at [redacted]w[redacted]d, here for a NST for pregnancy complicated by [facility] and P6PP.  She reports active fetal movement, contractions: none, vaginal bleeding: none, membranes: intact.   OBJECTIVE:  LMP 05/03/2021   Appears well, no apparent distress  Results for orders placed or performed during the hospital encounter of 12/24/21 (from the past 24 hour(s))  CBC   Collection Time: 12/24/21  4:59 PM  Result Value Ref Range   WBC 9.7 4.0 - 10.5 K/uL   RBC 4.23 3.87 - 5.11 MIL/uL   Hemoglobin 12.3 12.0 - 15.0 g/dL   HCT 36.6 36.0 - 46.0 %   MCV 86.5 80.0 - 100.0 fL   MCH 29.1 26.0 - 34.0 pg   MCHC 33.6 30.0 - 36.0 g/dL   RDW 14.4 11.5 - 15.5 %   Platelets 281 150 - 400 K/uL   nRBC 0.0 0.0 - 0.2 %  Comprehensive metabolic panel   Collection Time: 12/24/21  4:59 PM  Result Value Ref Range   Sodium 137 135 - 145 mmol/L   Potassium 4.3 3.5 - 5.1 mmol/L   Chloride 105 98 - 111 mmol/L   CO2 22 22 - 32 mmol/L   Glucose, Bld 82 70 - 99 mg/dL   BUN 13 6 - 20 mg/dL   Creatinine, Ser 0.56 0.44 - 1.00 mg/dL   Calcium 9.9 8.9 - 10.3 mg/dL   Total Protein 6.3 (L) 6.5 - 8.1 g/dL   Albumin 2.8 (L) 3.5 - 5.0 g/dL   AST 19 15 - 41 U/L   ALT 17 0 - 44 U/L   Alkaline Phosphatase 63 38 - 126 U/L   Total Bilirubin 0.7 0.3 - 1.2 mg/dL   GFR, Estimated >60 >60 mL/min   Anion gap 10 5 - 15  Protein / creatinine ratio, urine   Collection Time: 12/24/21  5:10 PM  Result Value Ref Range   Creatinine, Urine 24 mg/dL   Total Protein, Urine <6 mg/dL   Protein Creatinine Ratio        0.00 - 0.15 mg/mg[Cre]    NST: FHR baseline 135  bpm, Variability: moderate, Accelerations:present, Decelerations:  Absent= Cat 1/reactive Toco: none   ASSESSMENT: [id number] at [redacted]w[redacted]d with CHTN and A1DM NST reactive  PLAN: EFM strip reviewed by Nigel Berthold, CNM   Recommendations: keep next appointment as scheduled    [name]   12/25/2021 2:27 PM   Chart reviewed for nurse visit. Agree with plan of care.  Christin Fudge, [place] 12/25/2021 6:39 PM

## 2021-12-27 ENCOUNTER — Other Ambulatory Visit: Payer: Self-pay | Admitting: Obstetrics & Gynecology

## 2021-12-27 ENCOUNTER — Encounter: Payer: Self-pay | Admitting: Obstetrics & Gynecology

## 2021-12-27 DIAGNOSIS — O4443 Low lying placenta NOS or without hemorrhage, third trimester: Secondary | ICD-10-CM

## 2021-12-27 DIAGNOSIS — O09523 Supervision of elderly multigravida, third trimester: Secondary | ICD-10-CM

## 2021-12-27 DIAGNOSIS — O365931 Maternal care for other known or suspected poor fetal growth, third trimester, fetus 1: Secondary | ICD-10-CM

## 2021-12-28 ENCOUNTER — Ambulatory Visit (INDEPENDENT_AMBULATORY_CARE_PROVIDER_SITE_OTHER): Payer: Medicaid Other | Admitting: Obstetrics & Gynecology

## 2021-12-28 ENCOUNTER — Ambulatory Visit (INDEPENDENT_AMBULATORY_CARE_PROVIDER_SITE_OTHER): Payer: Medicaid Other

## 2021-12-28 ENCOUNTER — Encounter: Payer: Medicaid Other | Admitting: Obstetrics & Gynecology

## 2021-12-28 ENCOUNTER — Encounter: Payer: Self-pay | Admitting: Obstetrics & Gynecology

## 2021-12-28 VITALS — BP 130/84 | HR 96 | Wt 234.0 lb

## 2021-12-28 DIAGNOSIS — O4443 Low lying placenta NOS or without hemorrhage, third trimester: Secondary | ICD-10-CM

## 2021-12-28 DIAGNOSIS — O4402 Placenta previa specified as without hemorrhage, second trimester: Secondary | ICD-10-CM

## 2021-12-28 DIAGNOSIS — O365931 Maternal care for other known or suspected poor fetal growth, third trimester, fetus 1: Secondary | ICD-10-CM

## 2021-12-28 DIAGNOSIS — O365990 Maternal care for other known or suspected poor fetal growth, unspecified trimester, not applicable or unspecified: Secondary | ICD-10-CM

## 2021-12-28 DIAGNOSIS — O09523 Supervision of elderly multigravida, third trimester: Secondary | ICD-10-CM | POA: Diagnosis not present

## 2021-12-28 DIAGNOSIS — O0993 Supervision of high risk pregnancy, unspecified, third trimester: Secondary | ICD-10-CM

## 2021-12-28 DIAGNOSIS — I10 Essential (primary) hypertension: Secondary | ICD-10-CM

## 2021-12-28 DIAGNOSIS — Z3A33 33 weeks gestation of pregnancy: Secondary | ICD-10-CM

## 2021-12-28 NOTE — Progress Notes (Signed)
[place] 68+6 wks,cephalic,BPP 1/6,OHF 290 bpm,anterior low lying placenta gr 3,cx 2.9 cm,AFI 14.6 cm,mild right renal pelvic dilatation 7.5 mm,elevated umbilical artery dopplers with EDF,RI .80,.68,.76=96%

## 2021-12-28 NOTE — Progress Notes (Signed)
HIGH-RISK PREGNANCY VISIT Patient name: [name] MRN [id number]  Date of birth: November 20, 1980 Chief Complaint:   Routine Prenatal Visit  History of Present Illness:   [name] is a 41 y.o. [id number] female at [redacted]w[redacted]d with an Estimated Date of Delivery: 02/14/22 being seen today for ongoing management of a high-risk pregnancy complicated by chronic hypertension currently on norvasc 5 mg and diabetes mellitus A1DM.  (Was on metformin but off now)  Today she reports no complaints. Contractions: Irritability. Vag. Bleeding: None.  Movement: Present. denies leaking of fluid.      08/03/2021   10:21 AM 01/19/2020   10:54 AM 12/31/2017    8:59 AM 11/01/2017   10:06 AM 09/19/2017    8:38 AM  Depression screen PHQ 2/9  Decreased Interest 0 0 0 0 0  Down, Depressed, Hopeless 1 0 0 0 1  PHQ - 2 Score 1 0 0 0 1  Altered sleeping 0 0 0 0 0  Tired, decreased energy 1 0 1 0 0  Change in appetite 0 0     Feeling bad or failure about yourself  0 1 0 1 0  Trouble concentrating 0 0 0 0 0  Moving slowly or fidgety/restless 0 0     Suicidal thoughts 0 0 0 0 0  PHQ-9 Score 2 1 1 1 1         08/03/2021   10:21 AM 01/19/2020   10:55 AM  GAD 7 : Generalized Anxiety Score  Nervous, Anxious, on Edge 1 0  Control/stop worrying 0 0  Worry too much - different things 0 1  Trouble relaxing 0 0  Restless 0 0  Easily annoyed or irritable 0 0  Afraid - awful might happen 0 0  Total GAD 7 Score 1 1     Review of Systems:   Pertinent items are noted in HPI Denies abnormal vaginal discharge w/ itching/odor/irritation, headaches, visual changes, shortness of breath, chest pain, abdominal pain, severe nausea/vomiting, or problems with urination or bowel movements unless otherwise stated above. Pertinent History Reviewed:  Reviewed past medical,surgical, social, obstetrical and family history.  Reviewed problem list, medications and allergies. Physical Assessment:   Vitals:   12/28/21 1419  BP:  130/84  Pulse: 96  Weight: 234 lb (106.1 kg)  Body mass index is 41.45 kg/m.           Physical Examination:   General appearance: alert, well appearing, and in no distress  Mental status: alert, oriented to person, place, and time  Skin: warm & dry   Extremities: Edema: None    Cardiovascular: normal heart rate noted  Respiratory: normal respiratory effort, no distress  Abdomen: gravid, soft, non-tender  Pelvic: Cervical exam deferred         Fetal Status:     Movement: Present    Fetal Surveillance Testing today: BPP 8/8 with Dopplers 96%   Chaperone: N/A    No results found for this or any previous visit (from the past 24 hour(s)).  Assessment & Plan:  High-risk pregnancy: [id number] at [redacted]w[redacted]d with an Estimated Date of Delivery: 02/14/22      ICD-10-CM   1. Supervision of high risk pregnancy in third trimester  O09.93     2. Fetal growth restriction antepartum  O36.5990     3. Chronic hypertension  I10          Meds: No orders of the defined types were placed in this encounter.   Orders: No orders of  the defined types were placed in this encounter.    Labs/procedures today: U/S  Treatment Plan:  twice weekly surveillance, previa has resolved, now not even LLP, ^Dopplers   Follow-up: Return for keep scheduled.   Future Appointments  Date Time Provider Tool  01/01/2022  9:50 AM [facility] NURSE [facility]  01/04/2022  2:15 PM [place] - FTOBGYN [place] [facility]-FT[facility]G None  01/04/2022  3:10 PM Florian Buff, MD [facility]FT FTOBGYN  01/08/2022  2:50 PM [facility]FT[facility]GY[facility] NURSE [facility]TO[facility]  01/11/2022  1:30 PM [place] - FTOBGYN [place] [facility]-FT[facility]G None  01/11/2022  2:30 PM Janyth Pupa, DO [facility]FT FTOBGYN  01/15/2022 11:00 AM Tobb, Kardie, DO [facility] None  01/15/2022  2:30 PM [facility]GY[facility] NURSE [facility]-FT FTOBGYN  01/18/2022  1:30 PM [place] - FTOBGYN [place] C[facility]-FTIMG None  01/18/2022  2:30 PM Janyth Pupa, DO [facility]FT FTOBGYN  01/22/2022  2:50 PM [facility]FT[facility] NURSE [facility]FT [facility]TO[facility]YN   01/25/2022 10:00 AM [facility] - [facility]TO[facility] [place] [facility] None  01/25/2022 10:50 AM Florian Buff, MD [facility]TO[facility]  01/29/2022  3:10 PM [facility] NURSE [facility]TO[facility]  02/01/2022  1:30 PM [place] - FTOBGYN [place] C[facility]-FT[facility]G None  02/01/2022  2:30 PM Janyth Pupa, DO [facility]FT FTOBGYN  02/05/2022  3:10 PM [facility]GY[facility] NURSE [facility]FT [facility]TO[facility]YN  02/08/2022  1:30 PM [place] - FTOBGYN [place] C[facility]-FT[facility]G None  02/08/2022  2:30 PM Florian Buff, MD [facility]TO[facility]  02/12/2022  3:10 PM [facility] NURSE [facility]TO[facility]    No orders of the defined types were placed in this encounter.  Florian Buff  Attending Physician for the [facility] 12/28/2021 2:41 PM

## 2022-01-01 ENCOUNTER — Ambulatory Visit (INDEPENDENT_AMBULATORY_CARE_PROVIDER_SITE_OTHER): Payer: Medicaid Other | Admitting: *Deleted

## 2022-01-01 VITALS — BP 117/85 | HR 89 | Wt 232.4 lb

## 2022-01-01 DIAGNOSIS — O24410 Gestational diabetes mellitus in pregnancy, diet controlled: Secondary | ICD-10-CM

## 2022-01-01 DIAGNOSIS — O0993 Supervision of high risk pregnancy, unspecified, third trimester: Secondary | ICD-10-CM

## 2022-01-01 DIAGNOSIS — O09523 Supervision of elderly multigravida, third trimester: Secondary | ICD-10-CM

## 2022-01-01 DIAGNOSIS — O365990 Maternal care for other known or suspected poor fetal growth, unspecified trimester, not applicable or unspecified: Secondary | ICD-10-CM | POA: Diagnosis not present

## 2022-01-01 DIAGNOSIS — O10919 Unspecified pre-existing hypertension complicating pregnancy, unspecified trimester: Secondary | ICD-10-CM

## 2022-01-01 NOTE — Progress Notes (Signed)
   NURSE VISIT- NST  SUBJECTIVE:  [name] is a 41 y.o. [phone] female at [redacted]w[redacted]d, here for a NST for pregnancy complicated by AMA, CHTN and A1DM.  She reports active fetal movement, contractions: none, vaginal bleeding: none, membranes: intact.   OBJECTIVE:  BP 117/85   Pulse 89   Wt 232 lb 6.4 oz (105.4 kg)   LMP 05/03/2021   BMI 41.17 kg/m   Appears well, no apparent distress  Pt unable to void  NST: FHR baseline 135 bpm, Variability: moderate, Accelerations:present, Decelerations:  Absent= Cat 1/reactive Toco: none   ASSESSMENT: [id number] at [redacted]w[redacted]d with CHTN, A1DM, and FGR NST reactive  PLAN: EFM strip reviewed by Knute Neu, CNM, [facility]   Recommendations: keep next appointment as scheduled    [name]  01/01/2022 11:54 AM

## 2022-01-02 ENCOUNTER — Other Ambulatory Visit: Payer: Self-pay | Admitting: Obstetrics & Gynecology

## 2022-01-02 DIAGNOSIS — O4443 Low lying placenta NOS or without hemorrhage, third trimester: Secondary | ICD-10-CM

## 2022-01-02 DIAGNOSIS — O09523 Supervision of elderly multigravida, third trimester: Secondary | ICD-10-CM

## 2022-01-02 DIAGNOSIS — O24410 Gestational diabetes mellitus in pregnancy, diet controlled: Secondary | ICD-10-CM

## 2022-01-02 DIAGNOSIS — O10919 Unspecified pre-existing hypertension complicating pregnancy, unspecified trimester: Secondary | ICD-10-CM

## 2022-01-02 DIAGNOSIS — O365930 Maternal care for other known or suspected poor fetal growth, third trimester, not applicable or unspecified: Secondary | ICD-10-CM

## 2022-01-04 ENCOUNTER — Ambulatory Visit (INDEPENDENT_AMBULATORY_CARE_PROVIDER_SITE_OTHER): Payer: Medicaid Other

## 2022-01-04 ENCOUNTER — Ambulatory Visit (INDEPENDENT_AMBULATORY_CARE_PROVIDER_SITE_OTHER): Payer: Medicaid Other | Admitting: Obstetrics & Gynecology

## 2022-01-04 VITALS — BP 138/88 | HR 93 | Wt 236.0 lb

## 2022-01-04 DIAGNOSIS — O0993 Supervision of high risk pregnancy, unspecified, third trimester: Secondary | ICD-10-CM

## 2022-01-04 DIAGNOSIS — O365990 Maternal care for other known or suspected poor fetal growth, unspecified trimester, not applicable or unspecified: Secondary | ICD-10-CM

## 2022-01-04 DIAGNOSIS — O365930 Maternal care for other known or suspected poor fetal growth, third trimester, not applicable or unspecified: Secondary | ICD-10-CM

## 2022-01-04 DIAGNOSIS — O10919 Unspecified pre-existing hypertension complicating pregnancy, unspecified trimester: Secondary | ICD-10-CM | POA: Diagnosis not present

## 2022-01-04 DIAGNOSIS — Z3A34 34 weeks gestation of pregnancy: Secondary | ICD-10-CM | POA: Diagnosis not present

## 2022-01-04 DIAGNOSIS — O4443 Low lying placenta NOS or without hemorrhage, third trimester: Secondary | ICD-10-CM

## 2022-01-04 DIAGNOSIS — O4402 Placenta previa specified as without hemorrhage, second trimester: Secondary | ICD-10-CM

## 2022-01-04 DIAGNOSIS — O24410 Gestational diabetes mellitus in pregnancy, diet controlled: Secondary | ICD-10-CM

## 2022-01-04 DIAGNOSIS — O09523 Supervision of elderly multigravida, third trimester: Secondary | ICD-10-CM

## 2022-01-04 LAB — POCT URINALYSIS DIPSTICK OB
Blood, UA: NEGATIVE
Glucose, UA: NEGATIVE
Ketones, UA: NEGATIVE
Nitrite, UA: NEGATIVE
POC,PROTEIN,UA: NEGATIVE

## 2022-01-04 NOTE — Progress Notes (Signed)
HIGH-RISK PREGNANCY VISIT Patient name: [name] MRN [id number]  Date of birth: [birth year] Chief Complaint:   Routine Prenatal Visit  History of Present Illness:   [name] is a 41 y.o. [id number] female at [redacted]w[redacted]d with an Estimated Date of Delivery: 02/14/22 being seen today for ongoing management of a high-risk pregnancy complicated by [facility] on Norvasc 5 mg qd with elevated UAD, 96%, A1DM(was on metformin coverage with steroids), FGR.    Today she reports no complaints. Contractions: Not present. Vag. Bleeding: None.  Movement: Present. denies leaking of fluid.      08/03/2021   10:21 AM 01/19/2020   10:54 AM 12/31/2017    8:59 AM 11/01/2017   10:06 AM 09/19/2017    8:38 AM  Depression screen PHQ 2/9  Decreased Interest 0 0 0 0 0  Down, Depressed, Hopeless 1 0 0 0 1  PHQ - 2 Score 1 0 0 0 1  Altered sleeping 0 0 0 0 0  Tired, decreased energy 1 0 1 0 0  Change in appetite 0 0     Feeling bad or failure about yourself  0 1 0 1 0  Trouble concentrating 0 0 0 0 0  Moving slowly or fidgety/restless 0 0     Suicidal thoughts 0 0 0 0 0  PHQ-9 Score 2 1 1 1 1         08/03/2021   10:21 AM 01/19/2020   10:55 AM  GAD 7 : Generalized Anxiety Score  Nervous, Anxious, on Edge 1 0  Control/stop worrying 0 0  Worry too much - different things 0 1  Trouble relaxing 0 0  Restless 0 0  Easily annoyed or irritable 0 0  Afraid - awful might happen 0 0  Total GAD 7 Score 1 1     Review of Systems:   Pertinent items are noted in HPI Denies abnormal vaginal discharge w/ itching/odor/irritation, headaches, visual changes, shortness of breath, chest pain, abdominal pain, severe nausea/vomiting, or problems with urination or bowel movements unless otherwise stated above. Pertinent History Reviewed:  Reviewed past medical,surgical, social, obstetrical and family history.  Reviewed problem list, medications and allergies. Physical Assessment:   Vitals:   01/04/22 1455 01/04/22 1459   BP: (!) 136/90 138/88  Pulse: 92 93  Weight: 236 lb (107 kg)   Body mass index is 41.81 kg/m.           Physical Examination:   General appearance: alert, well appearing, and in no distress  Mental status: alert, oriented to person, place, and time  Skin: warm & dry   Extremities: Edema: Trace    Cardiovascular: normal heart rate noted  Respiratory: normal respiratory effort, no distress  Abdomen: gravid, soft, non-tender  Pelvic: Cervical exam deferred         Fetal Status:     Movement: Present    Fetal Surveillance Testing today: BPP 8/8 with UAD 96%   Chaperone: N/A    No results found for this or any previous visit (from the past 24 hour(s)).  Assessment & Plan:  High-risk pregnancy: [id number] at [redacted]w[redacted]d with an Estimated Date of Delivery: 02/14/22      ICD-10-CM   1. Supervision of high risk pregnancy in third trimester  O09.93     2. Fetal growth restriction antepartum  O36.5990     3. Diet controlled gestational diabetes mellitus (GDM) in third trimester  O24.410     4. Chronic hypertension affecting pregnancy  O10.919  Meds: No orders of the defined types were placed in this encounter.   Orders: No orders of the defined types were placed in this encounter.    Labs/procedures today: none  Treatment Plan:  twice weekly surveillance, IOL 37 weeks  Reviewed: Preterm labor symptoms and general obstetric precautions including but not limited to vaginal bleeding, contractions, leaking of fluid and fetal movement were reviewed in detail with the patient.  All questions were answered. Does have home bp cuff. Office bp cuff given: not applicable. Check bp daily, let us know if consistently >140 and/or >90.  Follow-up: No follow-ups on file.   Future Appointments  Date Time Provider [place]  01/08/2022  2:50 PM [facility] NURSE [facility]  01/11/2022  1:30 PM [place] - FTOBGYN [place] C[facility]-FT[facility]G None  01/11/2022  2:30 PM Janyth Pupa, DO [facility]TO[facility]   01/15/2022 11:00 AM Tobb, Kardie, DO [facility] None  01/15/2022  2:30 PM [facility] NURSE [facility]  01/18/2022  1:30 PM [place] - FTOBGYN [place] C[facility]-FT[facility] None  01/18/2022  2:30 PM Janyth Pupa, DO [facility] FTO[facility]  01/22/2022  2:50 PM [facility] NURSE [facility]  01/25/2022 10:00 AM [facility] - [facility] [place] [facility] None  01/25/2022 10:50 AM Florian Buff, MD [facility]  01/29/2022  3:10 PM [facility] NURSE [facility]  02/01/2022  1:30 PM [place] - FTOBGYN [place] [facility]-FT[facility] None  02/01/2022  2:30 PM Janyth Pupa, DO [facility]FT FTO[facility]  02/05/2022  3:10 PM [facility] NURSE [facility]  02/08/2022  1:30 PM [place] - FTOBGYN [place] C[facility]-FT[facility]G None  02/08/2022  2:30 PM Florian Buff, MD [facility]  02/12/2022  3:10 PM [facility] NURSE [facility]    No orders of the defined types were placed in this encounter.  Florian Buff  Attending Physician for the [facility] 01/04/2022 3:36 PM

## 2022-01-04 NOTE — Addendum Note (Signed)
Addended by: Janece Canterbury on: 01/04/2022 04:15 PM   Modules accepted: Orders

## 2022-01-04 NOTE — Progress Notes (Signed)
[place] 34+1 wks,complete breech,anterior placenta gr 3,tip of placenta to cx 2 cm (transabdominal),cx 3.3 cm,AFI 14.9 cm,RI .73,.71,.72,.77=96%,elevated UAD with EDF,FHR 159 bpm,BPP 8/8

## 2022-01-08 ENCOUNTER — Ambulatory Visit (INDEPENDENT_AMBULATORY_CARE_PROVIDER_SITE_OTHER): Payer: Medicaid Other | Admitting: *Deleted

## 2022-01-08 VITALS — BP 127/85 | HR 88

## 2022-01-08 DIAGNOSIS — O24410 Gestational diabetes mellitus in pregnancy, diet controlled: Secondary | ICD-10-CM

## 2022-01-08 DIAGNOSIS — O0993 Supervision of high risk pregnancy, unspecified, third trimester: Secondary | ICD-10-CM

## 2022-01-08 DIAGNOSIS — O09523 Supervision of elderly multigravida, third trimester: Secondary | ICD-10-CM

## 2022-01-08 DIAGNOSIS — Z3A34 34 weeks gestation of pregnancy: Secondary | ICD-10-CM | POA: Diagnosis not present

## 2022-01-08 DIAGNOSIS — O365990 Maternal care for other known or suspected poor fetal growth, unspecified trimester, not applicable or unspecified: Secondary | ICD-10-CM

## 2022-01-08 DIAGNOSIS — Z331 Pregnant state, incidental: Secondary | ICD-10-CM

## 2022-01-08 DIAGNOSIS — O10919 Unspecified pre-existing hypertension complicating pregnancy, unspecified trimester: Secondary | ICD-10-CM | POA: Diagnosis not present

## 2022-01-08 DIAGNOSIS — Z1389 Encounter for screening for other disorder: Secondary | ICD-10-CM

## 2022-01-08 DIAGNOSIS — O288 Other abnormal findings on antenatal screening of mother: Secondary | ICD-10-CM

## 2022-01-08 LAB — POCT URINALYSIS DIPSTICK OB
Blood, UA: NEGATIVE
Glucose, UA: NEGATIVE
Ketones, UA: NEGATIVE
Leukocytes, UA: NEGATIVE
Nitrite, UA: NEGATIVE
POC,PROTEIN,UA: NEGATIVE

## 2022-01-08 NOTE — Progress Notes (Signed)
   NURSE VISIT- NST  SUBJECTIVE:  Aoi Kouns is a 42 y.o. [phone] female at [redacted]w[redacted]d, here for a NST for pregnancy complicated by AMA, FGR, CHTN and A1/BDM.  She reports active fetal movement, contractions: occasional, vaginal bleeding: none, membranes: intact.   OBJECTIVE:  BP 127/85   Pulse 88   LMP 05/03/2021   Appears well, no apparent distress  Results for orders placed or performed in visit on 01/08/22 (from the past 24 hour(s))  POC Urinalysis Dipstick OB   Collection Time: 01/08/22  3:33 PM  Result Value Ref Range   Color, UA     Clarity, UA     Glucose, UA Negative Negative   Bilirubin, UA     Ketones, UA neg    Spec Grav, UA     Blood, UA neg    pH, UA     POC,PROTEIN,UA Negative Negative, Trace, Small (1+), Moderate (2+), Large (3+), 4+   Urobilinogen, UA     Nitrite, UA neg    Leukocytes, UA Negative Negative   Appearance     Odor      NST: FHR baseline 125 bpm, Variability: moderate, Accelerations:present, Decelerations:  Absent= Cat 1/reactive Toco: none   ASSESSMENT: [id number] at [redacted]w[redacted]d with FGR,AMA, CHTN, and A1DM NST reactive  PLAN: EFM strip reviewed by Knute Neu, CNM, [facility]   Recommendations: keep next appointment as scheduled    [name]  01/08/2022 3:49 PM

## 2022-01-10 ENCOUNTER — Other Ambulatory Visit: Payer: Self-pay | Admitting: Obstetrics & Gynecology

## 2022-01-10 DIAGNOSIS — O365930 Maternal care for other known or suspected poor fetal growth, third trimester, not applicable or unspecified: Secondary | ICD-10-CM

## 2022-01-10 DIAGNOSIS — O09523 Supervision of elderly multigravida, third trimester: Secondary | ICD-10-CM

## 2022-01-10 DIAGNOSIS — O24410 Gestational diabetes mellitus in pregnancy, diet controlled: Secondary | ICD-10-CM

## 2022-01-10 DIAGNOSIS — O10919 Unspecified pre-existing hypertension complicating pregnancy, unspecified trimester: Secondary | ICD-10-CM

## 2022-01-11 ENCOUNTER — Encounter: Payer: Self-pay | Admitting: Obstetrics & Gynecology

## 2022-01-11 ENCOUNTER — Other Ambulatory Visit: Payer: Self-pay | Admitting: Obstetrics & Gynecology

## 2022-01-11 ENCOUNTER — Other Ambulatory Visit (HOSPITAL_COMMUNITY)
Admission: RE | Admit: 2022-01-11 | Discharge: 2022-01-11 | Disposition: A | Discharge status: Home / Self Care | Payer: Medicaid Other | Source: Ambulatory Visit | Attending: Obstetrics & Gynecology | Admitting: Obstetrics & Gynecology

## 2022-01-11 ENCOUNTER — Ambulatory Visit (INDEPENDENT_AMBULATORY_CARE_PROVIDER_SITE_OTHER): Payer: Medicaid Other

## 2022-01-11 ENCOUNTER — Ambulatory Visit (INDEPENDENT_AMBULATORY_CARE_PROVIDER_SITE_OTHER): Payer: Medicaid Other | Admitting: Obstetrics & Gynecology

## 2022-01-11 VITALS — BP 138/88 | HR 99 | Wt 234.4 lb

## 2022-01-11 DIAGNOSIS — O4443 Low lying placenta NOS or without hemorrhage, third trimester: Secondary | ICD-10-CM | POA: Diagnosis not present

## 2022-01-11 DIAGNOSIS — Z1389 Encounter for screening for other disorder: Secondary | ICD-10-CM | POA: Insufficient documentation

## 2022-01-11 DIAGNOSIS — O24410 Gestational diabetes mellitus in pregnancy, diet controlled: Secondary | ICD-10-CM

## 2022-01-11 DIAGNOSIS — O365930 Maternal care for other known or suspected poor fetal growth, third trimester, not applicable or unspecified: Secondary | ICD-10-CM

## 2022-01-11 DIAGNOSIS — Z331 Pregnant state, incidental: Secondary | ICD-10-CM | POA: Diagnosis not present

## 2022-01-11 DIAGNOSIS — O10919 Unspecified pre-existing hypertension complicating pregnancy, unspecified trimester: Secondary | ICD-10-CM | POA: Diagnosis not present

## 2022-01-11 DIAGNOSIS — O0993 Supervision of high risk pregnancy, unspecified, third trimester: Secondary | ICD-10-CM

## 2022-01-11 DIAGNOSIS — Z3A35 35 weeks gestation of pregnancy: Secondary | ICD-10-CM

## 2022-01-11 DIAGNOSIS — O09523 Supervision of elderly multigravida, third trimester: Secondary | ICD-10-CM

## 2022-01-11 DIAGNOSIS — O365990 Maternal care for other known or suspected poor fetal growth, unspecified trimester, not applicable or unspecified: Secondary | ICD-10-CM

## 2022-01-11 DIAGNOSIS — O4402 Placenta previa specified as without hemorrhage, second trimester: Secondary | ICD-10-CM

## 2022-01-11 LAB — POCT URINALYSIS DIPSTICK OB
Blood, UA: NEGATIVE
Glucose, UA: NEGATIVE
Ketones, UA: NEGATIVE
Nitrite, UA: NEGATIVE
POC,PROTEIN,UA: NEGATIVE

## 2022-01-11 NOTE — Progress Notes (Signed)
HIGH-RISK PREGNANCY VISIT Patient name: [name] MRN [id number]  Date of birth: May 10, 1980 Chief Complaint:   Routine Prenatal Visit and Pregnancy Ultrasound  History of Present Illness:   [name] is a 41 y.o. W5I6270 female at 85w1dwith an Estimated Date of Delivery: 02/14/22 being seen today for ongoing management of a high-risk pregnancy complicated by:  -chronic HTN- on Norvasc 581mdaily Currently asymptomatic -Low lying placenta -Fetal growth restriction with elevated dopplers -Unstable lie currently vertex, last visit breech.    Today she reports  occasional contractions .   Contractions: Irritability. Denies vaginal bleeding .  Movement: Present. denies leaking of fluid.      08/03/2021   10:21 AM 01/19/2020   10:54 AM 12/31/2017    8:59 AM 11/01/2017   10:06 AM 09/19/2017    8:38 AM  Depression screen PHQ 2/9  Decreased Interest 0 0 0 0 0  Down, Depressed, Hopeless 1 0 0 0 1  PHQ - 2 Score 1 0 0 0 1  Altered sleeping 0 0 0 0 0  Tired, decreased energy 1 0 1 0 0  Change in appetite 0 0     Feeling bad or failure about yourself  0 1 0 1 0  Trouble concentrating 0 0 0 0 0  Moving slowly or fidgety/restless 0 0     Suicidal thoughts 0 0 0 0 0  PHQ-9 Score _0 Current Outpatient Medications  Medication Instructions   Accu-Chek FastClix Lancets MISC Use as directed to check blood sugar 4 times daily   amLODipine (NORVASC) 5 mg, Oral, Daily   aspirin EC 162 mg, Oral, Daily, Swallow whole.   azelastine (ASTELIN) 0.1 % nasal spray 1 spray, Each Nare, As needed, Use in each nostril as directed   Blood Glucose Monitoring Suppl (ACCU-CHEK GUIDE ME) w/Device KIT 1 each, Does not apply, 4 times daily   Blood Pressure Monitor MISC For regular home bp monitoring during pregnancy   glucose blood (ACCU-CHEK GUIDE) test strip Use as instructed to check blood sugar 4 times daily   loratadine (CLARITIN) 10 mg, Oral, Daily PRN   metFORMIN (GLUCOPHAGE) 500 mg,  Oral, 2 times daily with meals   Pediatric Multivitamins-Iron (FLINTSTONES COMPLETE PO) 2 tablets, Oral   propranolol (INDERAL) 10 mg, Oral, Daily     Review of Systems:   Pertinent items are noted in HPI Denies abnormal vaginal discharge w/ itching/odor/irritation, headaches, visual changes, shortness of breath, chest pain, abdominal pain, severe nausea/vomiting, or problems with urination or bowel movements unless otherwise stated above. Pertinent History Reviewed:  Reviewed past medical,surgical, social, obstetrical and family history.  Reviewed problem list, medications and allergies. Physical Assessment:   Vitals:   01/11/22 1435  BP: 138/88  Pulse: 99  Weight: 234 lb 6.4 oz (106.3 kg)  Body mass index is 41.52 kg/m.           Physical Examination:   General appearance: alert, well appearing, and in no distress  Mental status: normal mood, behavior, speech, dress, motor activity, and thought processes  Skin: warm & dry   Extremities: Edema: Trace    Cardiovascular: normal heart rate noted  Respiratory: normal respiratory effort, no distress  Abdomen: gravid, soft, non-tender  Pelvic: Cervical exam performed  Dilation: Closed Effacement (%): Thick Station: -3  Fetal Status:     Movement: Present    Fetal Surveillance Testing today: BPP/NST, dopplers   NST being performed due to FGR,  Chronic HTN   Fetal Monitoring:  Baseline: 140 bpm, Variability: moderate, Accelerations: present, The accelerations are >15 bpm and more than 2 in 20 minutes, and Decelerations: Absent     Final diagnosis:   Reactive NST     Chaperone:  pt declined     Results for orders placed or performed in visit on 01/11/22 (from the past 24 hour(s))  POC Urinalysis Dipstick OB   Collection Time: 01/11/22  3:01 PM  Result Value Ref Range   Color, UA     Clarity, UA     Glucose, UA Negative Negative   Bilirubin, UA     Ketones, UA neg    Spec Grav, UA     Blood, UA neg    pH, UA      POC,PROTEIN,UA Negative Negative, Trace, Small (1+), Moderate (2+), Large (3+), 4+   Urobilinogen, UA     Nitrite, UA neg    Leukocytes, UA Trace (A) Negative   Appearance     Odor       Assessment & Plan:  High-risk pregnancy: [id number] at 108w1dwith an Estimated Date of Delivery: 02/14/22   -Chronic HTN -FGR with elevated dopplers -low lying placenta -desires permanent sterilization -GDMA1  -continue Norvasc -continue twice weekly testing -scheduled for IOL @ 37wks- schedule 10/19 am IOL -due to low lying placenta discussed that should considerable bleeding be noted, may require urgent C-section -plan for PP BTL  Meds: No orders of the defined types were placed in this encounter.   Labs/procedures today: BPP/dopplers, GBS, GC/C collected today  Treatment Plan:  as outlined above  Reviewed: Preterm labor symptoms and general obstetric precautions including but not limited to vaginal bleeding, contractions, leaking of fluid and fetal movement were reviewed in detail with the patient.  All questions were answered. Pt has home bp cuff. Check bp weekly, let u[place]now if >140/90.   Follow-up: Return for  as scheduled, IOL 10/19, 1wk BP check RN visit then 6wk PPV.   Future Appointments  Date Time Provider [place] 01/15/2022 11:00 AM Tobb, Kardie, DO [facility] None  01/15/2022  2:30 PM [facility] NURSE [facility]FT [facility]  01/18/2022  1:30 PM [place] [facility] [place] None  01/18/2022  2:30 PM OJanyth Pupa DO [facility]TO[facility]  01/22/2022  2:50 PM [facility]FT[facility] NURSE [facility]  01/25/2022  7:15 AM [facility] SCHED ROOM [facility] None    Orders Placed This Encounter  Procedures   Culture, beta strep (group b only)   POC Urinalysis Dipstick OB    JJanyth Pupa DO Attending OAndrew FDividefor WDe[facility] Company [place]

## 2022-01-11 NOTE — Progress Notes (Signed)
[place] TA/TV: 01+3 wks,cephalic,anterior low lying placenta gr 3,tip of placenta to cx 1.7 mm,FHR 146 BPM,BPP 8/8,AFI 10 cm,elevated UAD with EDF,RI .77,.75,.69=96%

## 2022-01-15 ENCOUNTER — Encounter: Payer: Self-pay | Admitting: Cardiology

## 2022-01-15 ENCOUNTER — Ambulatory Visit: Payer: Medicaid Other | Attending: Cardiology | Admitting: Cardiology

## 2022-01-15 ENCOUNTER — Ambulatory Visit (INDEPENDENT_AMBULATORY_CARE_PROVIDER_SITE_OTHER): Payer: Medicaid Other | Admitting: *Deleted

## 2022-01-15 VITALS — BP 133/87 | HR 86

## 2022-01-15 VITALS — BP 138/88 | HR 88 | Ht 62.5 in | Wt 232.6 lb

## 2022-01-15 DIAGNOSIS — Z3A35 35 weeks gestation of pregnancy: Secondary | ICD-10-CM | POA: Diagnosis not present

## 2022-01-15 DIAGNOSIS — O99210 Obesity complicating pregnancy, unspecified trimester: Secondary | ICD-10-CM

## 2022-01-15 DIAGNOSIS — O24410 Gestational diabetes mellitus in pregnancy, diet controlled: Secondary | ICD-10-CM | POA: Diagnosis not present

## 2022-01-15 DIAGNOSIS — O09523 Supervision of elderly multigravida, third trimester: Secondary | ICD-10-CM

## 2022-01-15 DIAGNOSIS — O0993 Supervision of high risk pregnancy, unspecified, third trimester: Secondary | ICD-10-CM

## 2022-01-15 DIAGNOSIS — O365990 Maternal care for other known or suspected poor fetal growth, unspecified trimester, not applicable or unspecified: Secondary | ICD-10-CM | POA: Diagnosis not present

## 2022-01-15 DIAGNOSIS — O10919 Unspecified pre-existing hypertension complicating pregnancy, unspecified trimester: Secondary | ICD-10-CM

## 2022-01-15 DIAGNOSIS — R42 Dizziness and giddiness: Secondary | ICD-10-CM | POA: Diagnosis not present

## 2022-01-15 DIAGNOSIS — O4402 Placenta previa specified as without hemorrhage, second trimester: Secondary | ICD-10-CM

## 2022-01-15 DIAGNOSIS — I493 Ventricular premature depolarization: Secondary | ICD-10-CM | POA: Diagnosis not present

## 2022-01-15 DIAGNOSIS — R55 Syncope and collapse: Secondary | ICD-10-CM

## 2022-01-15 LAB — CULTURE, BETA STREP (GROUP B ONLY): Strep Gp B Culture: NEGATIVE

## 2022-01-15 LAB — CERVICOVAGINAL ANCILLARY ONLY
Chlamydia: NEGATIVE
Comment: NEGATIVE
Comment: NORMAL
Neisseria Gonorrhea: NEGATIVE

## 2022-01-15 MED ORDER — PROPRANOLOL HCL 10 MG PO TABS: 10.0000 mg | ORAL_TABLET | Freq: Two times a day (BID) | ORAL | 1 refills | Status: DC

## 2022-01-15 NOTE — Progress Notes (Signed)
Cardiology Office Note:    Date:  01/18/2022   ID:  [name], DOB 1981-02-18, MRN [id number]  PCP:  Ralph Leyden, FNP  Cardiologist:  None  Electrophysiologist:  None   Referring MD: Ralph Leyden, FNP   " I am still experiencing palpitations and it makes me short of breath"  History of Present Illness:    [name] is a 41 y.o. female with a hx of gestational diabetes, chronic hypertension in pregnancy> I saw her initially on 10/24/2021 at that time  she reported increasing palpitations and dizziness.    I placed a monitor on the patient. Since I saw the patient she wore her monitor.    Last visit we discussed her monitor results.  I started the patient on propanolol 10 mg at nighttime.  She has tolerated this medication it seems to be helping.  Past Medical History:  Diagnosis Date   Anemia    Anxiety "fast hearbeat"   took Benadryl at end of pregnancy   Back pain affecting pregnancy in first trimester 05/27/2015   Calculus of gallbladder without cholecystitis without obstruction 05/31/2019   Chlamydia 08/03/2021   06/21/21 @ [facility]  Only took 3 doxycycline (d/t n/v)   POC 96/78/93: neg   Complication of anesthesia    Epidural 1 sided   Depression    During 1 pregnancy   Diabetes mellitus without complication (Home)    Dysrhythmia    Endometriosis    Gallstones    Gestational diabetes    just started checking BS 03/05/12   Headache(784.0)    Hematuria 05/27/2015   History of gestational diabetes in prior pregnancy, currently pregnant 06/21/2015   History of kidney stones    Irregular periods    Pregnancy induced hypertension    Recurrent upper respiratory infection (URI)    Mostly when pregnant   Spotting affecting pregnancy in first trimester 01/27/2015    Past Surgical History:  Procedure Laterality Date   APPENDECTOMY Right 09/03/2021   CHOLECYSTECTOMY N/A 03/20/2019   Procedure: LAPAROSCOPIC CHOLECYSTECTOMY;  Surgeon: Aviva Signs, MD;   Location: [facility];  Service: General;  Laterality: N/A;   LAPAROSCOPIC APPENDECTOMY N/A 09/03/2021   Procedure: APPENDECTOMY LAPAROSCOPIC;  Surgeon: Ralene Ok, MD;  Location: [facility];  Service: General;  Laterality: N/A;   MULTIPLE TOOTH EXTRACTIONS     WISDOM TOOTH EXTRACTION      Current Medications: Current Meds  Medication Sig   Accu-Chek FastClix Lancets MISC Use as directed to check blood sugar 4 times daily   amLODipine (NORVASC) 5 MG tablet Take 1 tablet (5 mg total) by mouth daily.   aspirin 81 MG EC tablet Take 2 tablets (162 mg total) by mouth daily. Swallow whole.   azelastine (ASTELIN) 0.1 % nasal spray Place 1 spray into both nostrils as needed. Use in each nostril as directed   Blood Glucose Monitoring Suppl (ACCU-CHEK GUIDE ME) w/Device KIT 1 each by Does not apply route 4 (four) times daily.   Blood Pressure Monitor MISC For regular home bp monitoring during pregnancy   glucose blood (ACCU-CHEK GUIDE) test strip Use as instructed to check blood sugar 4 times daily   loratadine (CLARITIN) 10 MG tablet Take 10 mg by mouth daily as needed for allergies.   Pediatric Multivitamins-Iron (FLINTSTONES COMPLETE PO) Take 2 tablets by mouth.   [DISCONTINUED] propranolol (INDERAL) 10 MG tablet Take 1 tablet (10 mg total) by mouth daily.     Allergies:   Sulfamethoxazole-trimethoprim   Social History  Socioeconomic History   Marital status: Divorced    Spouse name: Not on file   Number of children: Not on file   Years of education: Not on file   Highest education level: Not on file  Occupational History   Not on file  Tobacco Use   Smoking status: Former    Packs/day: 0.10    Years: 0.50    Total pack years: 0.05    Types: Cigarettes    Start date: 02/19/1998    Quit date: 09/17/1998    Years since quitting: 23.3   Smokeless tobacco: Never  Vaping Use   Vaping Use: Never used  Substance and Sexual Activity   Alcohol use: Not Currently    Comment: not while  pregnant   Drug use: No   Sexual activity: Not Currently    Partners: Male    Birth control/protection: None  Other Topics Concern   Not on file  Social History Narrative   Lives in [place], [place]   Does not work outside home.   Married for over 3 years.    Wear seatbelt.   Eats all food groups.   Enjoys dance.    Social Determinants of Health   Financial Resource Strain: Low Risk  (01/19/2020)   Overall Financial Resource Strain (CARDIA)    Difficulty of Paying Living Expenses: Not very hard  Food Insecurity: No Food Insecurity (08/03/2021)   Hunger Vital Sign    Worried About Running Out of Food in the Last Year: Never true    Ran Out of Food in the Last Year: Never true  Transportation Needs: No Transportation Needs (08/03/2021)   PRAPARE - Hydrologist (Medical): No    Lack of Transportation (Non-Medical): No  Physical Activity: Insufficiently Active (08/03/2021)   Exercise Vital Sign    Days of Exercise per Week: 1 day    Minutes of Exercise per Session: 10 min  Stress: No Stress Concern Present (08/03/2021)   [place]    Feeling of Stress : Only a little  Social Connections: Socially Isolated (08/03/2021)   Social Connection and Isolation Panel [NHANES]    Frequency of Communication with Friends and Family: More than three times a week    Frequency of Social Gatherings with Friends and Family: Three times a week    Attends Religious Services: Never    Active Member of Clubs or Organizations: No    Attends Archivist Meetings: Never    Marital Status: Divorced     Family History: The patient's family history includes ADD / ADHD in her daughter, daughter, daughter, and daughter; Anxiety disorder in her mother and sister; Asthma in her daughter; Breast cancer in her maternal grandmother; Cancer in her maternal grandmother and paternal grandmother; Depression in her  mother; Heart attack in her mother; Heart disease in her maternal grandmother; Heart failure in her maternal grandmother; Hypertension in her father and mother; Seizures in her daughter.  ROS:   Review of Systems  Constitution: Negative for decreased appetite, fever and weight gain.  HENT: Negative for congestion, ear discharge, hoarse voice and sore throat.   Eyes: Negative for discharge, redness, vision loss in right eye and visual halos.  Cardiovascular: Negative for chest pain, dyspnea on exertion, leg swelling, orthopnea and palpitations.  Respiratory: Negative for cough, hemoptysis, shortness of breath and snoring.   Endocrine: Negative for heat intolerance and polyphagia.  Hematologic/Lymphatic: Negative for bleeding problem. Does  not bruise/bleed easily.  Skin: Negative for flushing, nail changes, rash and suspicious lesions.  Musculoskeletal: Negative for arthritis, joint pain, muscle cramps, myalgias, neck pain and stiffness.  Gastrointestinal: Negative for abdominal pain, bowel incontinence, diarrhea and excessive appetite.  Genitourinary: Negative for decreased libido, genital sores and incomplete emptying.  Neurological: Negative for brief paralysis, focal weakness, headaches and loss of balance.  Psychiatric/Behavioral: Negative for altered mental status, depression and suicidal ideas.  Allergic/Immunologic: Negative for HIV exposure and persistent infections.    EKGs/Labs/Other Studies Reviewed:    The following studies were reviewed today:   EKG:  The ekg ordered today demonstrates   ZIO monitor 11/14/2021 Patch Wear Time:  7 days and 22 hours (2023-07-24T14:17:12-398 to 2023-08-01T12:53:34-0400) Patient had a min HR of 69 bpm, max HR of 129 bpm, and avg HR of 90 bpm. Predominant underlying rhythm was Sinus Rhythm. Isolated SVEs were rare (<1.0%), SVE Couplets were rare (<1.0%), and no SVE Triplets were present. Isolated VEs were frequent (8.2%,  84618), VE Couplets were  rare (<1.0%, 8), and no VE Triplets were present. Ventricular Bigeminy and Trigeminy were present.  Symptoms associated with frequent PVCs. Conclusion: Symptomatic frequent PVCs.  TTE 09/22/2021 IMPRESSIONS     1. Left ventricular ejection fraction, by estimation, is 60 to 65%. The  left ventricle has normal function. The left ventricle has no regional  wall motion abnormalities. Left ventricular diastolic parameters were  normal.   2. Right ventricular systolic function is normal. The right ventricular  size is normal.   3. The mitral valve is normal in structure. Trivial mitral valve  regurgitation. No evidence of mitral stenosis.   4. The aortic valve is normal in structure. Aortic valve regurgitation is  not visualized. No aortic stenosis is present.   5. The inferior vena cava is normal in size with greater than 50%  respiratory variability, suggesting right atrial pressure of 3 mmHg.   FINDINGS   Left Ventricle: Left ventricular ejection fraction, by estimation, is 60  to 65%. The left ventricle has normal function. The left ventricle has no  regional wall motion abnormalities. The left ventricular internal cavity  size was normal in size. There is   no left ventricular hypertrophy. Left ventricular diastolic parameters  were normal.   Right Ventricle: The right ventricular size is normal. No increase in  right ventricular wall thickness. Right ventricular systolic function is  normal.   Left Atrium: Left atrial size was normal in size.   Right Atrium: Right atrial size was normal in size.   Pericardium: There is no evidence of pericardial effusion.   Mitral Valve: The mitral valve is normal in structure. Trivial mitral  valve regurgitation. No evidence of mitral valve stenosis.   Tricuspid Valve: The tricuspid valve is normal in structure. Tricuspid  valve regurgitation is not demonstrated. No evidence of tricuspid  stenosis.   Aortic Valve: The aortic valve is  normal in structure. Aortic valve  regurgitation is not visualized. No aortic stenosis is present.   Pulmonic Valve: The pulmonic valve was normal in structure. Pulmonic valve  regurgitation is not visualized. No evidence of pulmonic stenosis.   Aorta: The aortic root is normal in size and structure.   Venous: The inferior vena cava is normal in size with greater than 50%  respiratory variability, suggesting right atrial pressure of 3 mmHg.   IAS/Shunts: No atrial level shunt detected by color flow Doppler.       Recent Labs: 09/25/2021: Magnesium 1.9 12/24/2021: ALT  17; BUN 13; Creatinine, Ser 0.56; Hemoglobin 12.3; Platelets 281; Potassium 4.3; Sodium 137  Recent Lipid Panel    Component Value Date/Time   CHOL 188 09/30/2020 1811   TRIG 181 (H) 09/30/2020 1811   HDL 53 09/30/2020 1811   CHOLHDL 3.5 09/30/2020 1811   VLDL 36 09/30/2020 1811   LDLCALC 99 09/30/2020 1811   LDLCALC 115 (H) 11/01/2017 1051    Physical Exam:    VS:  BP 138/88   Pulse 88   Ht 5' 2.5" (1.588 m)   Wt 232 lb 9.6 oz (105.5 kg)   LMP 05/03/2021   SpO2 99%   BMI 41.87 kg/m     Wt Readings from Last 3 Encounters:  01/18/22 232 lb 4 oz (105.3 kg)  01/15/22 232 lb 9.6 oz (105.5 kg)  01/11/22 234 lb 6.4 oz (106.3 kg)     GEN: Well nourished, well developed in no acute distress HEENT: Normal NECK: No JVD; No carotid bruits LYMPHATICS: No lymphadenopathy CARDIAC: S1S2 noted,RRR, no murmurs, rubs, gallops RESPIRATORY:  Clear to auscultation without rales, wheezing or rhonchi  ABDOMEN: Soft, non-tender, non-distended, +bowel sounds, no guarding. EXTREMITIES: No edema, No cyanosis, no clubbing MUSCULOSKELETAL:  No deformity  SKIN: Warm and dry NEUROLOGIC:  Alert and oriented x 3, non-focal PSYCHIATRIC:  Normal affect, good insight  ASSESSMENT:    1. Chronic hypertension affecting pregnancy   2. Postural dizziness with presyncope   3. Frequent PVCs   4. Symptomatic PVCs   5. Obesity in  pregnancy     PLAN:     Today we will increase her propranolol 10 mg twice a day.  Continue her amlodipine.  Her blood pressure is at target today.  The patient is in agreement with the above plan. The patient left the office in stable condition.  The patient will follow up in 10 weeks postpartum.   Medication Adjustments/Labs and Tests Ordered: Current medicines are reviewed at length with the patient today.  Concerns regarding medicines are outlined above.  No orders of the defined types were placed in this encounter.  Meds ordered this encounter  Medications   propranolol (INDERAL) 10 MG tablet    Sig: Take 1 tablet (10 mg total) by mouth 2 (two) times daily.    Dispense:  180 tablet    Refill:  1    Patient Instructions  Medication Instructions:  Your physician has recommended you make the following change in your medication: INCREASE: Propranolol 2m twice daily  *If you need a refill on your cardiac medications before your next appointment, please call your pharmacy*   Lab Work: NONE ordered at this time of appointment   If you have labs (blood work) drawn today and your tests are completely normal, you will receive your results only by: [place](if you have MyChart) OR A paper copy in the mail If you have any lab test that is abnormal or we need to change your treatment, we will call you to review the results.   Testing/Procedures: NONE ordered at this time of appointment     Follow-Up: [facility] you and your health needs are our priority.  As part of our continuing mission to provide you with exceptional heart care, we have created designated Provider Care Teams.  These Care Teams include your primary Cardiologist (physician) and Advanced Practice Providers (APPs -  Physician Assistants and Nurse Practitioners) who all work together to provide you with the care you need, when you need it.  We recommend signing up for the patient portal  called "MyChart".  Sign up information is provided on this After Visit Summary.  MyChart is used to connect with patients for Virtual Visits (Telemedicine).  Patients are able to view lab/test results, encounter notes, upcoming appointments, etc.  Non-urgent messages can be sent to your provider as well.   To learn more about what you can do with MyChart, go to [email or web address].    Your next appointment:   10 week(s)  The format for your next appointment:   In Person  Provider:   Berniece Salines, DO   Adopting a Healthy Lifestyle.  Know what a healthy weight is for you (roughly BMI <25) and aim to maintain this   Aim for 7+ servings of fruits and vegetables daily   65-80+ fluid ounces of water or unsweet tea for healthy kidneys   Limit to max 1 drink of alcohol per day; avoid smoking/tobacco   Limit animal fats in diet for cholesterol and heart health - choose grass fed whenever available   Avoid highly processed foods, and foods high in saturated/trans fats   Aim for low stress - take time to unwind and care for your mental health   Aim for 150 min of moderate intensity exercise weekly for heart health, and weights twice weekly for bone health   Aim for 7-9 hours of sleep daily   When it comes to diets, agreement about the perfect plan isnt easy to find, even among the experts. Experts at the [employer] developed an idea known as the Healthy Eating Plate. Just imagine a plate divided into logical, healthy portions.   The emphasis is on diet quality:   Load up on vegetables and fruits - one-half of your plate: Aim for color and variety, and remember that potatoes dont count.   Go for whole grains - one-quarter of your plate: Whole wheat, barley, wheat berries, quinoa, oats, brown rice, and foods made with them. If you want pasta, go with whole wheat pasta.   Protein power - one-quarter of your plate: Fish, chicken, beans, and nuts are all healthy,  versatile protein sources. Limit red meat.   The diet, however, does go beyond the plate, offering a few other suggestions.   Use healthy plant oils, such as olive, canola, soy, corn, sunflower and peanut. Check the labels, and avoid partially hydrogenated oil, which have unhealthy trans fats.   If youre thirsty, drink water. Coffee and tea are good in moderation, but skip sugary drinks and limit milk and dairy products to one or two daily servings.   The type of carbohydrate in the diet is more important than the amount. Some sources of carbohydrates, such as vegetables, fruits, whole grains, and beans-are healthier than others.   Finally, stay active  Signed, Berniece Salines, DO  01/18/2022 5:26 PM    [facility]

## 2022-01-15 NOTE — Patient Instructions (Signed)
Medication Instructions:  Your physician has recommended you make the following change in your medication: INCREASE: Propranolol 10mg  twice daily  *If you need a refill on your cardiac medications before your next appointment, please call your pharmacy*   Lab Work: NONE ordered at this time of appointment   If you have labs (blood work) drawn today and your tests are completely normal, you will receive your results only by: [place] (if you have MyChart) OR A paper copy in the mail If you have any lab test that is abnormal or we need to change your treatment, we will call you to review the results.   Testing/Procedures: NONE ordered at this time of appointment     Follow-Up: At [facility], you and your health needs are our priority.  As part of our continuing mission to provide you with exceptional heart care, we have created designated Provider Care Teams.  These Care Teams include your primary Cardiologist (physician) and Advanced Practice Providers (APPs -  Physician Assistants and Nurse Practitioners) who all work together to provide you with the care you need, when you need it.  We recommend signing up for the patient portal called "MyChart".  Sign up information is provided on this After Visit Summary.  MyChart is used to connect with patients for Virtual Visits (Telemedicine).  Patients are able to view lab/test results, encounter notes, upcoming appointments, etc.  Non-urgent messages can be sent to your provider as well.   To learn more about what you can do with MyChart, go to [email or web address].    Your next appointment:   10 week(s)  The format for your next appointment:   In Person  Provider:   Berniece Salines, DO

## 2022-01-15 NOTE — Progress Notes (Addendum)
   NURSE VISIT- NST  SUBJECTIVE:  [name] is a 41 y.o. [phone] female at [redacted]w[redacted]d, here for a NST for pregnancy complicated by [facility] and U2VO.  She reports active fetal movement, contractions: none, vaginal bleeding: none, membranes: intact.   OBJECTIVE:  BP 133/87   Pulse 86   LMP 05/03/2021   Appears well, no apparent distress  No results found for this or any previous visit (from the past 24 hour(s)).  NST: FHR baseline 145 bpm, Variability: moderate, Accelerations:present, Decelerations:  Absent= Cat 1/reactive Toco: none   ASSESSMENT: [id number] at [redacted]w[redacted]d with CHTN and A1DM NST reactive  PLAN: EFM strip reviewed by [name], CNM   Recommendations: keep next appointment as scheduled    [name]  01/15/2022 3:44 PM   Chart reviewed for nurse visit. Agree with plan of care.  [name], CNM 01/15/2022 4:40 PM

## 2022-01-17 ENCOUNTER — Encounter (HOSPITAL_COMMUNITY): Payer: Self-pay

## 2022-01-17 ENCOUNTER — Other Ambulatory Visit: Payer: Self-pay | Admitting: Obstetrics & Gynecology

## 2022-01-17 ENCOUNTER — Telehealth (HOSPITAL_COMMUNITY): Payer: Self-pay | Admitting: *Deleted

## 2022-01-17 DIAGNOSIS — O10919 Unspecified pre-existing hypertension complicating pregnancy, unspecified trimester: Secondary | ICD-10-CM

## 2022-01-17 DIAGNOSIS — O365931 Maternal care for other known or suspected poor fetal growth, third trimester, fetus 1: Secondary | ICD-10-CM

## 2022-01-17 DIAGNOSIS — O09523 Supervision of elderly multigravida, third trimester: Secondary | ICD-10-CM

## 2022-01-17 DIAGNOSIS — O4443 Low lying placenta NOS or without hemorrhage, third trimester: Secondary | ICD-10-CM

## 2022-01-17 NOTE — Telephone Encounter (Signed)
Preadmission screen  

## 2022-01-18 ENCOUNTER — Other Ambulatory Visit: Payer: Self-pay

## 2022-01-18 ENCOUNTER — Encounter (HOSPITAL_COMMUNITY): Payer: Self-pay | Admitting: *Deleted

## 2022-01-18 ENCOUNTER — Encounter: Payer: Self-pay | Admitting: Obstetrics & Gynecology

## 2022-01-18 ENCOUNTER — Inpatient Hospital Stay (HOSPITAL_COMMUNITY): Payer: Medicaid Other | Admitting: Anesthesiology

## 2022-01-18 ENCOUNTER — Ambulatory Visit (INDEPENDENT_AMBULATORY_CARE_PROVIDER_SITE_OTHER): Payer: Medicaid Other

## 2022-01-18 ENCOUNTER — Inpatient Hospital Stay (HOSPITAL_COMMUNITY)
Admission: AD | Admit: 2022-01-18 | Discharge: 2022-01-21 | DRG: 784 | Disposition: A | Discharge status: Home / Self Care | Payer: Medicaid Other | Attending: Family Medicine | Admitting: Family Medicine

## 2022-01-18 ENCOUNTER — Encounter (HOSPITAL_COMMUNITY): Payer: Self-pay | Admitting: Obstetrics & Gynecology

## 2022-01-18 ENCOUNTER — Ambulatory Visit (INDEPENDENT_AMBULATORY_CARE_PROVIDER_SITE_OTHER): Payer: Medicaid Other | Admitting: Obstetrics & Gynecology

## 2022-01-18 ENCOUNTER — Telehealth (HOSPITAL_COMMUNITY): Payer: Self-pay | Admitting: *Deleted

## 2022-01-18 ENCOUNTER — Encounter (HOSPITAL_COMMUNITY)
Admission: AD | Disposition: A | Discharge status: Home / Self Care | Payer: Self-pay | Source: Home / Self Care | Attending: Family Medicine

## 2022-01-18 VITALS — BP 175/98 | HR 100 | Wt 232.2 lb

## 2022-01-18 DIAGNOSIS — Z9851 Tubal ligation status: Secondary | ICD-10-CM

## 2022-01-18 DIAGNOSIS — O09523 Supervision of elderly multigravida, third trimester: Secondary | ICD-10-CM

## 2022-01-18 DIAGNOSIS — O365931 Maternal care for other known or suspected poor fetal growth, third trimester, fetus 1: Secondary | ICD-10-CM

## 2022-01-18 DIAGNOSIS — O4403 Placenta previa specified as without hemorrhage, third trimester: Secondary | ICD-10-CM | POA: Diagnosis not present

## 2022-01-18 DIAGNOSIS — O99344 Other mental disorders complicating childbirth: Secondary | ICD-10-CM | POA: Diagnosis not present

## 2022-01-18 DIAGNOSIS — O322XX0 Maternal care for transverse and oblique lie, not applicable or unspecified: Secondary | ICD-10-CM | POA: Diagnosis present

## 2022-01-18 DIAGNOSIS — O365990 Maternal care for other known or suspected poor fetal growth, unspecified trimester, not applicable or unspecified: Principal | ICD-10-CM

## 2022-01-18 DIAGNOSIS — O164 Unspecified maternal hypertension, complicating childbirth: Secondary | ICD-10-CM | POA: Diagnosis not present

## 2022-01-18 DIAGNOSIS — O24419 Gestational diabetes mellitus in pregnancy, unspecified control: Secondary | ICD-10-CM | POA: Diagnosis present

## 2022-01-18 DIAGNOSIS — O24420 Gestational diabetes mellitus in childbirth, diet controlled: Secondary | ICD-10-CM | POA: Diagnosis present

## 2022-01-18 DIAGNOSIS — Z302 Encounter for sterilization: Secondary | ICD-10-CM | POA: Diagnosis not present

## 2022-01-18 DIAGNOSIS — Z98891 History of uterine scar from previous surgery: Secondary | ICD-10-CM

## 2022-01-18 DIAGNOSIS — M25511 Pain in right shoulder: Secondary | ICD-10-CM | POA: Diagnosis not present

## 2022-01-18 DIAGNOSIS — F419 Anxiety disorder, unspecified: Secondary | ICD-10-CM | POA: Diagnosis not present

## 2022-01-18 DIAGNOSIS — O0990 Supervision of high risk pregnancy, unspecified, unspecified trimester: Secondary | ICD-10-CM

## 2022-01-18 DIAGNOSIS — D62 Acute posthemorrhagic anemia: Secondary | ICD-10-CM | POA: Diagnosis not present

## 2022-01-18 DIAGNOSIS — Z6791 Unspecified blood type, Rh negative: Secondary | ICD-10-CM

## 2022-01-18 DIAGNOSIS — O365930 Maternal care for other known or suspected poor fetal growth, third trimester, not applicable or unspecified: Principal | ICD-10-CM | POA: Diagnosis present

## 2022-01-18 DIAGNOSIS — I10 Essential (primary) hypertension: Secondary | ICD-10-CM

## 2022-01-18 DIAGNOSIS — O9081 Anemia of the puerperium: Secondary | ICD-10-CM | POA: Diagnosis not present

## 2022-01-18 DIAGNOSIS — O99214 Obesity complicating childbirth: Secondary | ICD-10-CM | POA: Diagnosis present

## 2022-01-18 DIAGNOSIS — O43813 Placental infarction, third trimester: Secondary | ICD-10-CM | POA: Diagnosis not present

## 2022-01-18 DIAGNOSIS — Z3A36 36 weeks gestation of pregnancy: Secondary | ICD-10-CM

## 2022-01-18 DIAGNOSIS — Z7982 Long term (current) use of aspirin: Secondary | ICD-10-CM | POA: Diagnosis not present

## 2022-01-18 DIAGNOSIS — O24410 Gestational diabetes mellitus in pregnancy, diet controlled: Secondary | ICD-10-CM

## 2022-01-18 DIAGNOSIS — O99893 Other specified diseases and conditions complicating puerperium: Secondary | ICD-10-CM | POA: Diagnosis not present

## 2022-01-18 DIAGNOSIS — N838 Other noninflammatory disorders of ovary, fallopian tube and broad ligament: Secondary | ICD-10-CM | POA: Diagnosis not present

## 2022-01-18 DIAGNOSIS — O0993 Supervision of high risk pregnancy, unspecified, third trimester: Secondary | ICD-10-CM

## 2022-01-18 DIAGNOSIS — O10919 Unspecified pre-existing hypertension complicating pregnancy, unspecified trimester: Secondary | ICD-10-CM

## 2022-01-18 DIAGNOSIS — O24429 Gestational diabetes mellitus in childbirth, unspecified control: Secondary | ICD-10-CM

## 2022-01-18 DIAGNOSIS — Z818 Family history of other mental and behavioral disorders: Secondary | ICD-10-CM

## 2022-01-18 DIAGNOSIS — O321XX0 Maternal care for breech presentation, not applicable or unspecified: Secondary | ICD-10-CM

## 2022-01-18 DIAGNOSIS — O26893 Other specified pregnancy related conditions, third trimester: Secondary | ICD-10-CM | POA: Diagnosis not present

## 2022-01-18 DIAGNOSIS — Z23 Encounter for immunization: Secondary | ICD-10-CM

## 2022-01-18 DIAGNOSIS — O26899 Other specified pregnancy related conditions, unspecified trimester: Secondary | ICD-10-CM

## 2022-01-18 DIAGNOSIS — O1002 Pre-existing essential hypertension complicating childbirth: Secondary | ICD-10-CM | POA: Diagnosis present

## 2022-01-18 DIAGNOSIS — Z87891 Personal history of nicotine dependence: Secondary | ICD-10-CM

## 2022-01-18 DIAGNOSIS — O4443 Low lying placenta NOS or without hemorrhage, third trimester: Secondary | ICD-10-CM | POA: Diagnosis present

## 2022-01-18 DIAGNOSIS — O09529 Supervision of elderly multigravida, unspecified trimester: Secondary | ICD-10-CM

## 2022-01-18 DIAGNOSIS — O4402 Placenta previa specified as without hemorrhage, second trimester: Secondary | ICD-10-CM

## 2022-01-18 DIAGNOSIS — O411430 Placentitis, third trimester, not applicable or unspecified: Secondary | ICD-10-CM | POA: Diagnosis not present

## 2022-01-18 LAB — COMPREHENSIVE METABOLIC PANEL
ALT: 16 U/L (ref 0–44)
AST: 18 U/L (ref 15–41)
Albumin: 2.7 g/dL — ABNORMAL LOW (ref 3.5–5.0)
Alkaline Phosphatase: 83 U/L (ref 38–126)
Anion gap: 7 (ref 5–15)
BUN: 16 mg/dL (ref 6–20)
CO2: 22 mmol/L (ref 22–32)
Calcium: 10.1 mg/dL (ref 8.9–10.3)
Chloride: 109 mmol/L (ref 98–111)
Creatinine, Ser: 0.58 mg/dL (ref 0.44–1.00)
GFR, Estimated: >60 mL/min (ref >60)
Glucose, Bld: 97 mg/dL (ref 70–99)
Potassium: 3.7 mmol/L (ref 3.5–5.1)
Sodium: 138 mmol/L (ref 135–145)
Total Bilirubin: 0.4 mg/dL (ref 0.3–1.2)
Total Protein: 6.3 g/dL — ABNORMAL LOW (ref 6.5–8.1)

## 2022-01-18 LAB — CBC
HCT: 36.8 % (ref 36.0–46.0)
Hemoglobin: 12.2 g/dL (ref 12.0–15.0)
MCH: 29.2 pg (ref 26.0–34.0)
MCHC: 33.2 g/dL (ref 30.0–36.0)
MCV: 88 fL (ref 80.0–100.0)
Platelets: 262 10*3/uL (ref 150–400)
RBC: 4.18 MIL/uL (ref 3.87–5.11)
RDW: 14.6 % (ref 11.5–15.5)
WBC: 10.4 10*3/uL (ref 4.0–10.5)
nRBC: 0 % (ref 0.0–0.2)

## 2022-01-18 LAB — PROTEIN / CREATININE RATIO, URINE
Creatinine, Urine: 96 mg/dL
Protein Creatinine Ratio: 0.14 mg/mg{Cre} (ref 0.00–0.15)
Total Protein, Urine: 13 mg/dL

## 2022-01-18 LAB — GLUCOSE, CAPILLARY: Glucose-Capillary: 80 mg/dL (ref 70–99)

## 2022-01-18 LAB — PREPARE RBC (CROSSMATCH)

## 2022-01-18 SURGERY — Surgical Case
Anesthesia: Spinal

## 2022-01-18 MED ORDER — TETANUS-DIPHTH-ACELL PERTUSSIS 5-2.5-18.5 LF-MCG/0.5 IM SUSY
0.5000 mL | PREFILLED_SYRINGE | Freq: Once | INTRAMUSCULAR | Status: DC
  Filled 2022-01-18: qty 0.5

## 2022-01-18 MED ORDER — SIMETHICONE 80 MG PO CHEW: 80.0000 mg | CHEWABLE_TABLET | ORAL | Status: DC | PRN

## 2022-01-18 MED ORDER — OXYTOCIN-SODIUM CHLORIDE 30-0.9 UT/500ML-% IV SOLN
2.5000 [IU]/h | INTRAVENOUS | Status: AC
  Administered 2022-01-18: 2.5 [IU]/h via INTRAVENOUS
  Filled 2022-01-18: qty 500

## 2022-01-18 MED ORDER — TRANEXAMIC ACID-NACL 1000-0.7 MG/100ML-% IV SOLN
1000.0000 mg | INTRAVENOUS | Status: AC
  Administered 2022-01-18: 1000 mg via INTRAVENOUS

## 2022-01-18 MED ORDER — COCONUT OIL OIL: 1.0000 | TOPICAL_OIL | Status: DC | PRN

## 2022-01-18 MED ORDER — OXYCODONE HCL 5 MG PO TABS: 5.0000 mg | ORAL_TABLET | Freq: Once | ORAL | Status: DC | PRN

## 2022-01-18 MED ORDER — MEASLES, MUMPS & RUBELLA VAC IJ SOLR: 0.5000 mL | Freq: Once | INTRAMUSCULAR | Status: DC

## 2022-01-18 MED ORDER — KETOROLAC TROMETHAMINE 30 MG/ML IJ SOLN
INTRAMUSCULAR | Status: AC
  Filled 2022-01-18: qty 1

## 2022-01-18 MED ORDER — DIPHENHYDRAMINE HCL 25 MG PO CAPS: 25.0000 mg | ORAL_CAPSULE | ORAL | Status: DC | PRN

## 2022-01-18 MED ORDER — ONDANSETRON HCL 4 MG/2ML IJ SOLN
INTRAMUSCULAR | Status: AC
  Filled 2022-01-18: qty 2

## 2022-01-18 MED ORDER — SODIUM CHLORIDE 0.9 % IV SOLN
500.0000 mg | INTRAVENOUS | Status: AC
  Administered 2022-01-18: 500 mg via INTRAVENOUS
  Filled 2022-01-18: qty 5

## 2022-01-18 MED ORDER — CEFAZOLIN SODIUM-DEXTROSE 2-4 GM/100ML-% IV SOLN
2.0000 g | INTRAVENOUS | Status: AC
  Administered 2022-01-18: 2 g via INTRAVENOUS

## 2022-01-18 MED ORDER — KETOROLAC TROMETHAMINE 30 MG/ML IJ SOLN: 30.0000 mg | Freq: Four times a day (QID) | INTRAMUSCULAR | Status: DC | PRN

## 2022-01-18 MED ORDER — SODIUM CHLORIDE 0.9 % IV SOLN
INTRAVENOUS | Status: AC
  Filled 2022-01-18: qty 5

## 2022-01-18 MED ORDER — KETOROLAC TROMETHAMINE 30 MG/ML IJ SOLN
30.0000 mg | Freq: Four times a day (QID) | INTRAMUSCULAR | Status: AC
  Administered 2022-01-19 (×3): 30 mg via INTRAVENOUS
  Filled 2022-01-18 (×3): qty 1

## 2022-01-18 MED ORDER — WITCH HAZEL-GLYCERIN EX PADS: 1.0000 | MEDICATED_PAD | CUTANEOUS | Status: DC | PRN

## 2022-01-18 MED ORDER — ACETAMINOPHEN 10 MG/ML IV SOLN: 1000.0000 mg | Freq: Once | INTRAVENOUS | Status: DC | PRN

## 2022-01-18 MED ORDER — BUPIVACAINE IN DEXTROSE 0.75-8.25 % IT SOLN
INTRATHECAL | Status: DC | PRN
  Administered 2022-01-18: 1.8 mL via INTRATHECAL

## 2022-01-18 MED ORDER — ENOXAPARIN SODIUM 60 MG/0.6ML IJ SOSY
50.0000 mg | PREFILLED_SYRINGE | INTRAMUSCULAR | Status: DC
  Administered 2022-01-19 – 2022-01-20 (×2): 50 mg via SUBCUTANEOUS
  Filled 2022-01-18 (×2): qty 0.6

## 2022-01-18 MED ORDER — KETOROLAC TROMETHAMINE 30 MG/ML IJ SOLN
30.0000 mg | Freq: Four times a day (QID) | INTRAMUSCULAR | Status: DC | PRN
  Administered 2022-01-18: 30 mg via INTRAVENOUS

## 2022-01-18 MED ORDER — DEXAMETHASONE SODIUM PHOSPHATE 10 MG/ML IJ SOLN
INTRAMUSCULAR | Status: DC | PRN
  Administered 2022-01-18: 10 mg via INTRAVENOUS

## 2022-01-18 MED ORDER — SCOPOLAMINE 1 MG/3DAYS TD PT72
MEDICATED_PATCH | TRANSDERMAL | Status: DC | PRN
  Administered 2022-01-18: 1 via TRANSDERMAL

## 2022-01-18 MED ORDER — MEPERIDINE HCL 25 MG/ML IJ SOLN: 6.2500 mg | INTRAMUSCULAR | Status: DC | PRN

## 2022-01-18 MED ORDER — OXYCODONE HCL 5 MG/5ML PO SOLN: 5.0000 mg | Freq: Once | ORAL | Status: DC | PRN

## 2022-01-18 MED ORDER — MEPERIDINE HCL 25 MG/ML IJ SOLN
INTRAMUSCULAR | Status: AC
  Filled 2022-01-18: qty 1

## 2022-01-18 MED ORDER — SODIUM CHLORIDE 0.9% FLUSH: 3.0000 mL | INTRAVENOUS | Status: DC | PRN

## 2022-01-18 MED ORDER — FENTANYL CITRATE (PF) 100 MCG/2ML IJ SOLN
INTRAMUSCULAR | Status: DC | PRN
  Administered 2022-01-18: 15 ug via INTRATHECAL

## 2022-01-18 MED ORDER — DIBUCAINE (PERIANAL) 1 % EX OINT: 1.0000 | TOPICAL_OINTMENT | CUTANEOUS | Status: DC | PRN

## 2022-01-18 MED ORDER — PHENYLEPHRINE HCL-NACL 20-0.9 MG/250ML-% IV SOLN
INTRAVENOUS | Status: AC
  Filled 2022-01-18: qty 250

## 2022-01-18 MED ORDER — SOD CITRATE-CITRIC ACID 500-334 MG/5ML PO SOLN
30.0000 mL | ORAL | Status: AC
  Administered 2022-01-18: 30 mL via ORAL
  Filled 2022-01-18: qty 30

## 2022-01-18 MED ORDER — LACTATED RINGERS IV SOLN: INTRAVENOUS | Status: DC

## 2022-01-18 MED ORDER — MENTHOL 3 MG MT LOZG: 1.0000 | LOZENGE | OROMUCOSAL | Status: DC | PRN

## 2022-01-18 MED ORDER — DIPHENHYDRAMINE HCL 50 MG/ML IJ SOLN
12.5000 mg | INTRAMUSCULAR | Status: DC | PRN
  Administered 2022-01-19: 12.5 mg via INTRAVENOUS
  Filled 2022-01-18: qty 1

## 2022-01-18 MED ORDER — ONDANSETRON HCL 4 MG/2ML IJ SOLN
INTRAMUSCULAR | Status: DC | PRN
  Administered 2022-01-18: 4 mg via INTRAVENOUS

## 2022-01-18 MED ORDER — PHENYLEPHRINE HCL-NACL 20-0.9 MG/250ML-% IV SOLN
INTRAVENOUS | Status: DC | PRN
  Administered 2022-01-18: 20 ug/min via INTRAVENOUS

## 2022-01-18 MED ORDER — AMLODIPINE BESYLATE 5 MG PO TABS
5.0000 mg | ORAL_TABLET | Freq: Every day | ORAL | Status: DC
  Administered 2022-01-19 – 2022-01-21 (×3): 5 mg via ORAL
  Filled 2022-01-18 (×3): qty 1

## 2022-01-18 MED ORDER — SOD CITRATE-CITRIC ACID 500-334 MG/5ML PO SOLN: 30.0000 mL | Freq: Once | ORAL | Status: DC

## 2022-01-18 MED ORDER — OXYTOCIN-SODIUM CHLORIDE 30-0.9 UT/500ML-% IV SOLN
INTRAVENOUS | Status: AC
  Filled 2022-01-18: qty 500

## 2022-01-18 MED ORDER — FUROSEMIDE 20 MG PO TABS
20.0000 mg | ORAL_TABLET | Freq: Every day | ORAL | Status: DC
  Administered 2022-01-19 – 2022-01-21 (×3): 20 mg via ORAL
  Filled 2022-01-18 (×3): qty 1

## 2022-01-18 MED ORDER — OXYCODONE HCL 5 MG PO TABS: 5.0000 mg | ORAL_TABLET | ORAL | Status: DC | PRN

## 2022-01-18 MED ORDER — PRENATAL MULTIVITAMIN CH: 1.0000 | ORAL_TABLET | Freq: Every day | ORAL | Status: DC

## 2022-01-18 MED ORDER — LACTATED RINGERS IV SOLN: INTRAVENOUS | Status: DC | PRN

## 2022-01-18 MED ORDER — SIMETHICONE 80 MG PO CHEW
80.0000 mg | CHEWABLE_TABLET | Freq: Three times a day (TID) | ORAL | Status: DC
  Administered 2022-01-19 – 2022-01-20 (×6): 80 mg via ORAL
  Filled 2022-01-18 (×6): qty 1

## 2022-01-18 MED ORDER — ACETAMINOPHEN 325 MG PO TABS: 650.0000 mg | ORAL_TABLET | ORAL | Status: DC | PRN

## 2022-01-18 MED ORDER — SCOPOLAMINE 1 MG/3DAYS TD PT72: 1.0000 | MEDICATED_PATCH | Freq: Once | TRANSDERMAL | Status: DC

## 2022-01-18 MED ORDER — OXYTOCIN-SODIUM CHLORIDE 30-0.9 UT/500ML-% IV SOLN
INTRAVENOUS | Status: DC | PRN
  Administered 2022-01-18: 400 mL via INTRAVENOUS

## 2022-01-18 MED ORDER — TRANEXAMIC ACID-NACL 1000-0.7 MG/100ML-% IV SOLN
INTRAVENOUS | Status: AC
  Filled 2022-01-18: qty 100

## 2022-01-18 MED ORDER — HYDROMORPHONE HCL 1 MG/ML IJ SOLN: 0.2500 mg | INTRAMUSCULAR | Status: DC | PRN

## 2022-01-18 MED ORDER — MEPERIDINE HCL 25 MG/ML IJ SOLN
INTRAMUSCULAR | Status: DC | PRN
  Administered 2022-01-18 (×2): 12.5 mg via INTRAVENOUS

## 2022-01-18 MED ORDER — DIPHENHYDRAMINE HCL 25 MG PO CAPS: 25.0000 mg | ORAL_CAPSULE | Freq: Four times a day (QID) | ORAL | Status: DC | PRN

## 2022-01-18 MED ORDER — MORPHINE SULFATE (PF) 0.5 MG/ML IJ SOLN
INTRAMUSCULAR | Status: DC | PRN
  Administered 2022-01-18: 150 ug via INTRATHECAL

## 2022-01-18 MED ORDER — POVIDONE-IODINE 10 % EX SWAB
2.0000 | Freq: Once | CUTANEOUS | Status: AC
  Administered 2022-01-18: 2 via TOPICAL

## 2022-01-18 MED ORDER — IBUPROFEN 600 MG PO TABS: 600.0000 mg | ORAL_TABLET | Freq: Four times a day (QID) | ORAL | Status: DC

## 2022-01-18 MED ORDER — MORPHINE SULFATE (PF) 0.5 MG/ML IJ SOLN
INTRAMUSCULAR | Status: AC
  Filled 2022-01-18: qty 10

## 2022-01-18 MED ORDER — ONDANSETRON HCL 4 MG/2ML IJ SOLN: 4.0000 mg | Freq: Three times a day (TID) | INTRAMUSCULAR | Status: DC | PRN

## 2022-01-18 MED ORDER — NALOXONE HCL 0.4 MG/ML IJ SOLN: 0.4000 mg | INTRAMUSCULAR | Status: DC | PRN

## 2022-01-18 MED ORDER — SENNOSIDES-DOCUSATE SODIUM 8.6-50 MG PO TABS
2.0000 | ORAL_TABLET | Freq: Every day | ORAL | Status: DC
  Administered 2022-01-19 – 2022-01-21 (×3): 2 via ORAL
  Filled 2022-01-18 (×3): qty 2

## 2022-01-18 MED ORDER — NALOXONE HCL 4 MG/10ML IJ SOLN: 1.0000 ug/kg/h | INTRAVENOUS | Status: DC | PRN

## 2022-01-18 MED ORDER — CEFAZOLIN SODIUM-DEXTROSE 2-4 GM/100ML-% IV SOLN
INTRAVENOUS | Status: AC
  Filled 2022-01-18: qty 100

## 2022-01-18 MED ORDER — FAMOTIDINE IN NACL 20-0.9 MG/50ML-% IV SOLN
20.0000 mg | Freq: Once | INTRAVENOUS | Status: AC
  Administered 2022-01-18: 20 mg via INTRAVENOUS

## 2022-01-18 MED ORDER — FENTANYL CITRATE (PF) 100 MCG/2ML IJ SOLN
INTRAMUSCULAR | Status: AC
  Filled 2022-01-18: qty 2

## 2022-01-18 MED ORDER — MEDROXYPROGESTERONE ACETATE 150 MG/ML IM SUSP: 150.0000 mg | INTRAMUSCULAR | Status: DC | PRN

## 2022-01-18 MED ORDER — PROMETHAZINE HCL 25 MG/ML IJ SOLN: 6.2500 mg | INTRAMUSCULAR | Status: DC | PRN

## 2022-01-18 MED ORDER — DIPHENHYDRAMINE HCL 50 MG/ML IJ SOLN
INTRAMUSCULAR | Status: DC | PRN
  Administered 2022-01-18 (×2): 12.5 mg via INTRAVENOUS

## 2022-01-18 SURGICAL SUPPLY — 39 items
BENZOIN TINCTURE PRP APPL 2/3 (GAUZE/BANDAGES/DRESSINGS) ×1 IMPLANT
CHLORAPREP W/TINT 26 (MISCELLANEOUS) ×2 IMPLANT
CLAMP UMBILICAL CORD (MISCELLANEOUS) ×1 IMPLANT
CLOTH BEACON ORANGE TIMEOUT ST (SAFETY) ×1 IMPLANT
DERMABOND ADVANCED .7 DNX12 (GAUZE/BANDAGES/DRESSINGS) ×1 IMPLANT
DRSG OPSITE POSTOP 4X10 (GAUZE/BANDAGES/DRESSINGS) ×1 IMPLANT
ELECT REM PT RETURN 9FT ADLT (ELECTROSURGICAL) ×1
ELECTRODE REM PT RTRN 9FT ADLT (ELECTROSURGICAL) ×1 IMPLANT
EXTRACTOR VACUUM KIWI (MISCELLANEOUS) IMPLANT
GAUZE SPONGE 4X4 12PLY STRL LF (GAUZE/BANDAGES/DRESSINGS) IMPLANT
GLOVE BIOGEL PI IND STRL 7.0 (GLOVE) ×3 IMPLANT
GLOVE ECLIPSE 6.5 STRL STRAW (GLOVE) ×1 IMPLANT
GOWN STRL REUS W/TWL LRG LVL3 (GOWN DISPOSABLE) ×2 IMPLANT
KIT ABG SYR 3ML LUER SLIP (SYRINGE) IMPLANT
MAT PREVALON FULL STRYKER (MISCELLANEOUS) IMPLANT
NDL HYPO 25X5/8 SAFETYGLIDE (NEEDLE) IMPLANT
NEEDLE HYPO 25X5/8 SAFETYGLIDE (NEEDLE) IMPLANT
NS IRRIG 1000ML POUR BTL (IV SOLUTION) ×1 IMPLANT
PACK C SECTION WH (CUSTOM PROCEDURE TRAY) ×1 IMPLANT
PAD ABD 7.5X8 STRL (GAUZE/BANDAGES/DRESSINGS) ×1 IMPLANT
PAD OB MATERNITY 4.3X12.25 (PERSONAL CARE ITEMS) ×1 IMPLANT
PENCIL SMOKE EVAC W/HOLSTER (ELECTROSURGICAL) ×1 IMPLANT
RTRCTR C-SECT PINK 25CM LRG (MISCELLANEOUS) ×1 IMPLANT
STRIP CLOSURE SKIN 1/2X4 (GAUZE/BANDAGES/DRESSINGS) IMPLANT
SUT PLAIN 0 NONE (SUTURE) ×1 IMPLANT
SUT PLAIN 2 0 XLH (SUTURE) IMPLANT
SUT VIC AB 0 CT1 27 (SUTURE) ×2
SUT VIC AB 0 CT1 27XBRD ANBCTR (SUTURE) ×2 IMPLANT
SUT VIC AB 0 CTX 36 (SUTURE) ×4
SUT VIC AB 0 CTX36XBRD ANBCTRL (SUTURE) ×3 IMPLANT
SUT VIC AB 2-0 CT1 27 (SUTURE) ×1
SUT VIC AB 2-0 CT1 TAPERPNT 27 (SUTURE) ×1 IMPLANT
SUT VIC AB 3-0 SH 27 (SUTURE) ×2
SUT VIC AB 3-0 SH 27X BRD (SUTURE) IMPLANT
SUT VIC AB 3-0 SH 27XBRD (SUTURE) ×1 IMPLANT
SUT VIC AB 4-0 KS 27 (SUTURE) ×1 IMPLANT
TOWEL OR 17X24 6PK STRL BLUE (TOWEL DISPOSABLE) ×1 IMPLANT
TRAY FOLEY W/BAG SLVR 14FR LF (SET/KITS/TRAYS/PACK) IMPLANT
WATER STERILE IRR 1000ML POUR (IV SOLUTION) ×1 IMPLANT

## 2022-01-18 NOTE — Telephone Encounter (Signed)
Preadmission screen  

## 2022-01-18 NOTE — Anesthesia Procedure Notes (Signed)
Spinal  Patient location during procedure: OR Reason for block: surgical anesthesia Staffing Performed: anesthesiologist  Anesthesiologist: Kaleesi Guyton E, MD Performed by: Ellamarie Naeve E, MD Authorized by: Geovana Gebel E, MD   Preanesthetic Checklist Completed: patient identified, IV checked, risks and benefits discussed, surgical consent, monitors and equipment checked, pre-op evaluation and timeout performed Spinal Block Patient position: sitting Prep: DuraPrep and site prepped and draped Patient monitoring: continuous pulse ox, blood pressure and heart rate Approach: midline Location: L3-4 Injection technique: single-shot Needle Needle type: Pencan  Needle gauge: 24 G Needle length: 10 cm Assessment Events: CSF return Additional Notes Functioning IV was confirmed and monitors were applied. Sterile prep and drape, including hand hygiene and sterile gloves were used. The patient was positioned and the spine was prepped. The skin was anesthetized with lidocaine.  Free flow of clear CSF was obtained prior to injecting local anesthetic into the CSF. The needle was carefully withdrawn. The patient tolerated the procedure well.      

## 2022-01-18 NOTE — Progress Notes (Signed)
Failed IV attempt by K. Redmond Pulling, RN

## 2022-01-18 NOTE — Transfer of Care (Signed)
Immediate Anesthesia Transfer of Care Note  Patient: [name]  Procedure(s) Performed: CESAREAN SECTION WITH BILATERAL TUBAL LIGATION  Patient Location: PACU  Anesthesia Type:Spinal  Level of Consciousness: awake, alert , and patient cooperative  Airway & Oxygen Therapy: Patient Spontanous Breathing  Post-op Assessment: Report given to RN and Post -op Vital signs reviewed and stable  Post vital signs: Reviewed and stable  Last Vitals:  Vitals Value Taken Time  BP 157/87 01/18/22 2006  Temp    Pulse 96 01/18/22 2011  Resp 33 01/18/22 2011  SpO2 99 % 01/18/22 2011  Vitals shown include unvalidated device data.  Last Pain:  Vitals:   01/18/22 1605  TempSrc: Oral  PainSc: 4          Complications: No notable events documented.

## 2022-01-18 NOTE — Progress Notes (Signed)
At bedside to review management plan. Discussed that in the instance of concerning fetal well being, would plan to move forward with C-section before the 8hr window.  Risk benefits and alternatives of cesarean section were discussed with the patient including but not limited to infection, bleeding, damage to bowel , bladder and baby with the need for further surgery. Pt voiced understanding and desires to proceed.   Bilateral tubal ligation via salpingectomy reviewed with R&B including but not limited to bleeding, infection, injury to other organs, irreversibility.  Pt desires to proceed.  Pt noted to have 62min decel to 80bpm with spontaneous recovery.  OR notified of plan to move forward with delivery.  Janyth Pupa, DO Attending [facility] for [employer], [place]

## 2022-01-18 NOTE — Anesthesia Postprocedure Evaluation (Signed)
Anesthesia Post Note  Patient: [name]  Procedure(s) Performed: CESAREAN SECTION WITH BILATERAL TUBAL LIGATION     Patient location during evaluation: PACU Anesthesia Type: Spinal Level of consciousness: oriented and awake and alert Pain management: pain level controlled Vital Signs Assessment: post-procedure vital signs reviewed and stable Respiratory status: spontaneous breathing, respiratory function stable and nonlabored ventilation Cardiovascular status: blood pressure returned to baseline and stable Postop Assessment: no headache, no backache, no apparent nausea or vomiting and spinal receding Anesthetic complications: no   No notable events documented.  Last Vitals:  Vitals:   01/18/22 2052 01/18/22 2100  BP:    Pulse:    Resp:    Temp:    SpO2: 99% 98%    Last Pain:  Vitals:   01/18/22 2052  TempSrc:   PainSc: 6    Pain Goal:    LLE Motor Response: Purposeful movement (01/18/22 2052) LLE Sensation: Numbness, Tingling (01/18/22 2052) RLE Motor Response: Purposeful movement (01/18/22 2052) RLE Sensation: Numbness, Tingling (01/18/22 2052)     Epidural/Spinal Function Cutaneous sensation: Tingles (01/18/22 2052), Patient able to flex knees: Yes (01/18/22 2052), Patient able to lift hips off bed: No (01/18/22 2052), Back pain beyond tenderness at insertion site: No (01/18/22 2052), Progressively worsening motor and/or sensory loss: No (01/18/22 2052), Bowel and/or bladder incontinence post epidural: No (01/18/22 2052)  Lidia Collum

## 2022-01-18 NOTE — Anesthesia Preprocedure Evaluation (Signed)
Anesthesia Evaluation  Patient identified by MRN, date of birth, ID band Patient awake    Reviewed: Allergy & Precautions, H&P , NPO status , Patient's Chart, lab work & pertinent test results  History of Anesthesia Complications Negative for: history of anesthetic complications  Airway Mallampati: II  TM Distance: >3 FB     Dental  (+) Edentulous Upper, Edentulous Lower   Pulmonary neg pulmonary ROS, former smoker,    Pulmonary exam normal        Cardiovascular hypertension,  Rhythm:regular Rate:Normal     Neuro/Psych negative neurological ROS  negative psych ROS   GI/Hepatic negative GI ROS, Neg liver ROS,   Endo/Other  diabetes, GestationalMorbid obesity  Renal/GU negative Renal ROS  negative genitourinary   Musculoskeletal   Abdominal   Peds  Hematology negative hematology ROS (+)   Anesthesia Other Findings Nonreassuring FHT, BPP 4/8, transverse lie  Reproductive/Obstetrics (+) Pregnancy [id number] at [redacted]w[redacted]d Transverse lie                             Anesthesia Physical Anesthesia Plan  ASA: 3 and emergent  Anesthesia Plan: Spinal   Post-op Pain Management:    Induction:   PONV Risk Score and Plan: Ondansetron and Treatment may vary due to age or medical condition  Airway Management Planned:   Additional Equipment:   Intra-op Plan:   Post-operative Plan:   Informed Consent: I have reviewed the patients History and Physical, chart, labs and discussed the procedure including the risks, benefits and alternatives for the proposed anesthesia with the patient or authorized representative who has indicated his/her understanding and acceptance.       Plan Discussed with: Anesthesiologist  Anesthesia Plan Comments:         Anesthesia Quick Evaluation

## 2022-01-18 NOTE — Lactation Note (Signed)
This note was copied from a baby's chart. Lactation Consultation Note  Patient Name: [name] [id number] Date: 01/18/2022   Age:42 hours  LC orders received and acknowledged. Patient still in PACU at the end of this [place] shift. NICU LC to follow up tomorrow for initial assessment.    [place] 01/18/2022, 7:48 PM

## 2022-01-18 NOTE — Discharge Summary (Signed)
Postpartum Discharge Summary     Patient Name: [name] DOB: Dec 14, 1980 MRN: [id number]  Date of admission: 01/18/2022 Delivery date:01/18/2022  Delivering provider: Janyth Pupa  Date of discharge: 01/21/2022  Admitting diagnosis: Transverse presentation, antepartum [O32.2XX0] Intrauterine pregnancy: [redacted]w[redacted]d    Secondary diagnosis:  Principal Problem:   Status post cesarean delivery Active Problems:   AMA (advanced maternal age) multigravida 3108+  Rh negative state in antepartum period   Supervision of high-risk pregnancy   Chronic hypertension affecting pregnancy   Gestational diabetes   Fetal growth restriction antepartum   Transverse presentation, antepartum   S/P tubal ligation  Additional problems: n/a    Discharge diagnosis: Preterm Pregnancy Delivered, CHTN, and GDM A1                                              Post partum procedures:intraoperative tubal ligation Augmentation: N/A Complications: None  Hospital course: Sceduled C/S   41y.o. yo [id number] admitted to the hospital 01/18/2022 for scheduled cesarean section with the following indication: malpresentation: transverse, and FGR with absent end diastolic blood flow .Delivery details are as follows:  Membrane Rupture Time/Date: 6:54 PM ,01/18/2022   Delivery Method:C-Section, Low Transverse  Details of operation can be found in separate operative note.  Patient had a postpartum course complicated by NONE.  She is ambulating, tolerating a regular diet, passing flatus, and urinating well. Patient is discharged home in stable condition on  01/21/22        Newborn Data: Birth date:01/18/2022  Birth time:6:55 PM  Gender:Female  Living status:Living  Apgars:3 ,9  Weight:1730 g     Magnesium Sulfate received: No BMZ received: No Rhophylac:N/A MMR:No, Immune T-DaP: not given Flu: Yes Transfusion:No  Physical exam  Vitals:   01/20/22 0837 01/20/22 1441 01/21/22 0013 01/21/22 0637  BP:  122/76 103/70 126/68 119/75  Pulse: 97 96  90  Resp:  17  16  Temp: 98 F (36.7 C) 98.7 F (37.1 C)  98 F (36.7 C)  TempSrc:  Oral    SpO2:  98%    Weight:      Height:       General: alert, cooperative, and no distress Lochia: appropriate Uterine Fundus: firm Incision: Healing well with no significant drainage, No significant erythema, Dressing is clean, dry, and intact DVT Evaluation: Calf/Ankle edema is present Labs: Lab Results  Component Value Date   WBC 13.3 (H) 01/19/2022   HGB 9.1 (L) 01/19/2022   HCT 28.0 (L) 01/19/2022   MCV 89.2 01/19/2022   PLT 226 01/19/2022      Latest Ref Rng & Units 01/18/2022    5:05 PM  CMP  Glucose 70 - 99 mg/dL 97   BUN 6 - 20 mg/dL 16   Creatinine 0.44 - 1.00 mg/dL 0.58   Sodium 135 - 145 mmol/L 138   Potassium 3.5 - 5.1 mmol/L 3.7   Chloride 98 - 111 mmol/L 109   CO2 22 - 32 mmol/L 22   Calcium 8.9 - 10.3 mg/dL 10.1   Total Protein 6.5 - 8.1 g/dL 6.3   Total Bilirubin 0.3 - 1.2 mg/dL 0.4   Alkaline Phos 38 - 126 U/L 83   AST 15 - 41 U/L 18   ALT 0 - 44 U/L 16    Edinburgh Score:    01/18/2022  9:35 PM  Edinburgh Postnatal Depression Scale Screening Tool  I have been able to laugh and see the funny side of things. 0  I have looked forward with enjoyment to things. 0  I have blamed myself unnecessarily when things went wrong. 1  I have been anxious or worried for no good reason. 1  I have felt scared or panicky for no good reason. 1  Things have been getting on top of me. 0  I have been so unhappy that I have had difficulty sleeping. 0  I have felt sad or miserable. 0  I have been so unhappy that I have been crying. 1  The thought of harming myself has occurred to me. 0  Edinburgh Postnatal Depression Scale Total 4     After visit meds:  Allergies as of 01/21/2022       Reactions   Sulfamethoxazole-trimethoprim Swelling, Rash        Medication List     STOP taking these medications    aspirin EC 81 MG  tablet   azelastine 0.1 % nasal spray Commonly known as: ASTELIN   propranolol 10 MG tablet Commonly known as: INDERAL       TAKE these medications    Accu-Chek FastClix Lancets Misc Use as directed to check blood sugar 4 times daily   Accu-Chek Guide Me w/Device Kit 1 each by Does not apply route 4 (four) times daily.   Accu-Chek Guide test strip Generic drug: glucose blood Use as instructed to check blood sugar 4 times daily   acetaminophen 500 MG tablet Commonly known as: TYLENOL Take 1 tablet (500 mg total) by mouth every 6 (six) hours as needed for mild pain.   amLODipine 5 MG tablet Commonly known as: Norvasc Take 1 tablet (5 mg total) by mouth daily. What changed: Another medication with the same name was added. Make sure you understand how and when to take each.   amLODipine 5 MG tablet Commonly known as: NORVASC Take 1 tablet (5 mg total) by mouth daily. What changed: You were already taking a medication with the same name, and this prescription was added. Make sure you understand how and when to take each.   Blood Pressure Monitor Misc For regular home bp monitoring during pregnancy   FLINTSTONES COMPLETE PO Take 2 tablets by mouth.   furosemide 20 MG tablet Commonly known as: LASIX Take 1 tablet (20 mg total) by mouth daily for 2 days.   ibuprofen 800 MG tablet Commonly known as: ADVIL Take 1 tablet (800 mg total) by mouth every 8 (eight) hours as needed.   Iron (Ferrous Sulfate) 325 (65 Fe) MG Tabs Take 1 tablet by mouth every other day.   loratadine 10 MG tablet Commonly known as: CLARITIN Take 10 mg by mouth daily as needed for allergies.   metFORMIN 500 MG tablet Commonly known as: GLUCOPHAGE Take 1 tablet (500 mg total) by mouth 2 (two) times daily with a meal.   senna-docusate 8.6-50 MG tablet Commonly known as: Senokot-S Take 2 tablets by mouth daily.   witch hazel-glycerin pad Commonly known as: TUCKS Apply 1 Application topically  as needed for hemorrhoids.               Discharge Care Instructions  (From admission, onward)           Start     Ordered   01/21/22 0000  Discharge wound care:       Comments: C-section wound care: You may  feel pain/discomfort/burning sensation for several weeks. Keep the wound area clean by washing it with mild soap and water. You don't need to scrub it. Often, just letting the water run over your wound in the shower is enough. We do not recommend creams as this can cause infection, but we do recommend oral medication (prescribed), pressure dressings and running warm water on the wound to help ease discomfort/burning pain.   01/21/22 0722             Discharge home in stable condition Infant Feeding: Bottle Infant Disposition:home with mother Discharge instruction: per After Visit Summary and Postpartum booklet. Activity: Advance as tolerated. Pelvic rest for 6 weeks.  Diet: carb modified diet Future Appointments: Future Appointments  Date Time Provider [place]  01/22/2022  2:50 PM [facility] NURSE [facility]  02/01/2022 11:10 AM [facility] NURSE [facility]  02/22/2022  1:30 PM Cresenzo-Dishmon, Joaquim Lai, CNM [facility]  03/29/2022 11:20 AM Tobb, Godfrey Pick, DO [facility] None   Follow up Visit:  The following message was sent to [facility] by Mikki Santee, MD   Please schedule this patient for a In person postpartum visit in 6 weeks with the following provider: MD. Additional Postpartum F/U:2 hour GTT, Incision check 1 week, and BP check 1 week  High risk pregnancy complicated by:  cHTN, U8QBV, FGR Delivery mode:  C-Section, Low Transverse  Anticipated Birth Control:  BTL done PP   01/21/2022 Shelda Pal, DO

## 2022-01-18 NOTE — Progress Notes (Signed)
HIGH-RISK PREGNANCY VISIT Patient name: [name] MRN [id number]  Date of birth: 20-Sep-1980 Chief Complaint:   Routine Prenatal Visit  History of Present Illness:   [name] is a 41 y.o. [id number] female at 14w1dwith an Estimated Date of Delivery: 02/14/22 being seen today for ongoing management of a high-risk pregnancy complicated by:  -Chronic HTN- on Norvasc 57mdaily -GDMA1 (short course of metformin during BMZ) -FGR- last growth- 9/14 @ 3244w1dFW 4.7%  Per pt she reports leaking of fluid for the past 2 days.  She thought it was just urine; however, she also notes decreased fetal movement during this time.  Denies vaginal bleeding, notes irregular contractions.  Contractions: Irritability.  .  Movement: Present. reports leaking of fluid.      08/03/2021   10:21 AM 01/19/2020   10:54 AM 12/31/2017    8:59 AM 11/01/2017   10:06 AM 09/19/2017    8:38 AM  Depression screen PHQ 2/9  Decreased Interest 0 0 0 0 0  Down, Depressed, Hopeless 1 0 0 0 1  PHQ - 2 Score 1 0 0 0 1  Altered sleeping 0 0 0 0 0  Tired, decreased energy 1 0 1 0 0  Change in appetite 0 0     Feeling bad or failure about yourself  0 1 0 1 0  Trouble concentrating 0 0 0 0 0  Moving slowly or fidgety/restless 0 0     Suicidal thoughts 0 0 0 0 0  PHQ-9 Score '2 1 1 1 1     ' Current Outpatient Medications  Medication Instructions   Accu-Chek FastClix Lancets MISC Use as directed to check blood sugar 4 times daily   amLODipine (NORVASC) 5 mg, Oral, Daily   aspirin EC 162 mg, Oral, Daily, Swallow whole.   azelastine (ASTELIN) 0.1 % nasal spray 1 spray, Each Nare, As needed, Use in each nostril as directed   Blood Glucose Monitoring Suppl (ACCU-CHEK GUIDE ME) w/Device KIT 1 each, Does not apply, 4 times daily   Blood Pressure Monitor MISC For regular home bp monitoring during pregnancy   glucose blood (ACCU-CHEK GUIDE) test strip Use as instructed to check blood sugar 4 times daily   loratadine  (CLARITIN) 10 mg, Oral, Daily PRN   metFORMIN (GLUCOPHAGE) 500 mg, Oral, 2 times daily with meals   Pediatric Multivitamins-Iron (FLINTSTONES COMPLETE PO) 2 tablets, Oral   propranolol (INDERAL) 10 mg, Oral, 2 times daily     Review of Systems:   Pertinent items are noted in HPI Denies abnormal vaginal discharge w/ itching/odor/irritation, headaches, visual changes, shortness of breath, chest pain, abdominal pain, severe nausea/vomiting, or problems with urination or bowel movements unless otherwise stated above. Pertinent History Reviewed:  Reviewed past medical,surgical, social, obstetrical and family history.  Reviewed problem list, medications and allergies. Physical Assessment:   Vitals:   01/18/22 1436  BP: (!) 175/98  Pulse: 100  Weight: 232 lb 4 oz (105.3 kg)  Body mass index is 41.8 kg/m.           Physical Examination:   General appearance: alert, well appearing, and in no distress  Mental status: anxious  Skin: warm & dry   Extremities: Edema: Trace    Cardiovascular: normal heart rate noted  Respiratory: normal respiratory effort, no distress  Abdomen: gravid, soft, non-tender  Pelvic: Cervical exam deferred         Fetal Status:     Movement: Present    Fetal Surveillance Testing  today: complete breech,anterior placenta gr 3,low lying placenta,FHR 146 bpm,AFI  5.5 cm,BPP 4/8 (decreased fetal movement and absent tone),elevated UAD 99% with absent EDF,discussed results with Dr Nelda Marseille   Chaperone: N/A    No results found for this or any previous visit (from the past 24 hour(s)).   Assessment & Plan:  High-risk pregnancy: [id number] at 5w1dwith an Estimated Date of Delivery: 02/14/22   1) Fetal growth restriction now with AEDF, BPP 4/8 with suspected rupture based on history Pt advised to go directly to hospital for admission and delivery Due to breech presentation plan for primary C-section Last meal around 1pm Examination for rule out rupture not completed in  office- further evaluation (if needed) will be done in the hospital  -Chronic HTN- on Norvasc  BP elevated today which is likely due to nerves as she reports that she is not ready to have a baby today  Will monitor once pt arrives at the hospital -GDMA1 -Rh neg -AMA -low-lying placenta   Meds: No orders of the defined types were placed in this encounter.   Labs/procedures today: BPP/dopplers  Treatment Plan:  sent to hospital for delivery  Follow-up: Return for pt to hospital.   Future Appointments  Date Time Provider [place] 01/22/2022  2:50 PM [facility] NURSE [facility]  01/25/2022  7:15 AM [facility] SCHED ROOM [facility] None  02/01/2022 11:10 AM [facility] NURSE [facility]  02/22/2022  1:30 PM Cresenzo-Dishmon, FJoaquim Lai CNM [facility]  03/29/2022 11:20 AM Tobb, KGodfrey Pick DO [facility] None    No orders of the defined types were placed in this encounter.   JJanyth Pupa DO Attending [facility] LLCfor [employer] [place]

## 2022-01-18 NOTE — H&P (Signed)
LABOR AND DELIVERY ADMISSION HISTORY AND PHYSICAL NOTE  [name] is a 41 y.o. female [phone] with IUP at 81w1dpresenting for delivery due to non-reassuring fetal status.   [facility] [place] for BPP 4/8 with AFI 5.5 cm, and elevated umbilical artery dopplers with absent end diastolic flow. In addition baby is known IUGR, prior [place] shown EFW of 4.7%, today showed severe growth restriction with EFW <1%. Given this constellation of findings it was recommended that she proceed immediately with delivery and was sent to the hospital.   Here she reports ongoing leaking for the past two days. Endorses normal fetal movement. No bleeding or contractions. Denies headache, vision changes, chest pain, SOB, RUQ pain, LE edema.   She endorses significant anxiety.   She plans on breast and bottle feeding. She requests bilateral salpingectomy for birth control.  [facility]  [facility] , CWD, normal anatomy, breech presentation, anterior placenta, <1%ile, EFW 17628B Pregnancy complications:  - anxiety - Rh neg - placenta previa>low lying placenta - grand multipara - GDM - severe IUGR - cHTN not on meds - AMA  Past Medical History: Past Medical History:  Diagnosis Date   Anemia    Anxiety "fast hearbeat"   took Benadryl at end of pregnancy   Back pain affecting pregnancy in first trimester 05/27/2015   Calculus of gallbladder without cholecystitis without obstruction 05/31/2019   Chlamydia 08/03/2021   06/21/21 @ [facility]  Only took 3 doxycycline (d/t n/v)   POC 015/17/61 neg   Complication of anesthesia    Epidural 1 sided   Depression    During 1 pregnancy   Diabetes mellitus without complication ([place]    Dysrhythmia    Endometriosis    Gallstones    Gestational diabetes    just started checking BS 03/05/12   Headache(784.0)    Hematuria 05/27/2015   History of gestational diabetes in  prior pregnancy, currently pregnant 06/21/2015   History of kidney stones    Irregular periods    Pregnancy induced hypertension    Recurrent upper respiratory infection (URI)    Mostly when pregnant   Spotting affecting pregnancy in first trimester 01/27/2015    Past Surgical History: Past Surgical History:  Procedure Laterality Date   APPENDECTOMY Right 09/03/2021   CHOLECYSTECTOMY N/A 03/20/2019   Procedure: LAPAROSCOPIC CHOLECYSTECTOMY;  Surgeon: JAviva Signs MD;  Location: [facility];  Service: General;  Laterality: N/A;   LAPAROSCOPIC APPENDECTOMY N/A 09/03/2021   Procedure: APPENDECTOMY LAPAROSCOPIC;  Surgeon: RRalene Ok MD;  [facility];  Service: General;  Laterality: N/A;   MULTIPLE TOOTH EXTRACTIONS     WISDOM TOOTH EXTRACTION      Obstetrical History: OB History     Gravida  9   Para  6   Term  6   Preterm  0   AB  2   Living  6      SAB  2   IAB      Ectopic      Multiple  0   Live Births  6        Obstetric Comments  Dr. FNestor LewandowskyTree.          Social History: Social History   Socioeconomic History   Marital status: Divorced    Spouse name: Not on file   Number of children: Not on file   Years of education: Not on file  Highest education level: Not on file  Occupational History   Not on file  Tobacco Use   Smoking status: Former    Packs/day: 0.10    Years: 0.50    Total pack years: 0.05    Types: Cigarettes    Start date: 02/19/1998    Quit date: 09/17/1998    Years since quitting: 23.3   Smokeless tobacco: Never  Vaping Use   Vaping Use: Never used  Substance and Sexual Activity   Alcohol use: Not Currently    Comment: not while pregnant   Drug use: No   Sexual activity: Not Currently    Partners: Male    Birth control/protection: None  Other Topics Concern   Not on file  Social History Narrative   Lives in [place], [place]   Does not work outside home.   Married for over 3 years.    Wear  seatbelt.   Eats all food groups.   Enjoys dance.    Social Determinants of Health   Financial Resource Strain: Low Risk  (01/19/2020)   Overall Financial Resource Strain (CARDIA)    Difficulty of Paying Living Expenses: Not very hard  Food Insecurity: No Food Insecurity (08/03/2021)   Hunger Vital Sign    Worried About Running Out of Food in the Last Year: Never true    Ran Out of Food in the Last Year: Never true  Transportation Needs: No Transportation Needs (08/03/2021)   PRAPARE - Hydrologist (Medical): No    Lack of Transportation (Non-Medical): No  Physical Activity: Insufficiently Active (08/03/2021)   Exercise Vital Sign    Days of Exercise per Week: 1 day    Minutes of Exercise per Session: 10 min  Stress: No Stress Concern Present (08/03/2021)   [place]    Feeling of Stress : Only a little  Social Connections: Socially Isolated (08/03/2021)   Social Connection and Isolation Panel [NHANES]    Frequency of Communication with Friends and Family: More than three times a week    Frequency of Social Gatherings with Friends and Family: Three times a week    Attends Religious Services: Never    Active Member of Clubs or Organizations: No    Attends Archivist Meetings: Never    Marital Status: Divorced    Family History: Family History  Problem Relation Age of Onset   Hypertension Mother    Anxiety disorder Mother    Depression Mother    Heart attack Mother    Hypertension Father    Anxiety disorder Sister    ADD / ADHD Daughter    Asthma Daughter    ADD / ADHD Daughter    Seizures Daughter    ADD / ADHD Daughter    ADD / ADHD Daughter    Heart disease Maternal Grandmother    Cancer Maternal Grandmother        brain   Heart failure Maternal Grandmother    Breast cancer Maternal Grandmother    Cancer Paternal Grandmother        brain     Allergies: Allergies  Allergen Reactions   Sulfamethoxazole-Trimethoprim Swelling and Rash    Medications Prior to Admission  Medication Sig Dispense Refill Last Dose   amLODipine (NORVASC) 5 MG tablet Take 1 tablet (5 mg total) by mouth daily. 30 tablet 6 01/18/2022 at 1130   aspirin 81 MG EC tablet Take 2 tablets (162 mg total) by  mouth daily. Swallow whole. 180 tablet 2 01/17/2022   azelastine (ASTELIN) 0.1 % nasal spray Place 1 spray into both nostrils as needed. Use in each nostril as directed   01/18/2022   loratadine (CLARITIN) 10 MG tablet Take 10 mg by mouth daily as needed for allergies.   Past Week   Pediatric Multivitamins-Iron (FLINTSTONES COMPLETE PO) Take 2 tablets by mouth.   01/17/2022   propranolol (INDERAL) 10 MG tablet Take 1 tablet (10 mg total) by mouth 2 (two) times daily. 180 tablet 1 01/18/2022   Accu-Chek FastClix Lancets MISC Use as directed to check blood sugar 4 times daily 100 each 12    Blood Glucose Monitoring Suppl (ACCU-CHEK GUIDE ME) w/Device KIT 1 each by Does not apply route 4 (four) times daily. 1 kit 0    Blood Pressure Monitor MISC For regular home bp monitoring during pregnancy 1 each 0    glucose blood (ACCU-CHEK GUIDE) test strip Use as instructed to check blood sugar 4 times daily 50 each 12    metFORMIN (GLUCOPHAGE) 500 MG tablet Take 1 tablet (500 mg total) by mouth 2 (two) times daily with a meal. 60 tablet 3      Review of Systems  All systems reviewed and negative except as stated in HPI  Physical Exam Blood pressure 137/84, pulse 94, temperature 98.4 F (36.9 C), temperature source Oral, resp. rate 18, last menstrual period 05/03/2021, SpO2 97 %. General appearance: alert, oriented, NAD Lungs: normal respiratory effort Heart: regular rate Abdomen: soft, non-tender; gravid Extremities: No calf swelling or tenderness FHR: Baseline 150, moderate variability, +accels, no decels, toco quiet. Cat I  Prenatal labs: ABO, Rh:  --/--/PENDING (10/12 1705) Antibody: PENDING (10/12 1705) Rubella: 3.81 (04/27 1146) RPR: Non Reactive (08/15 1539)  HBsAg: Negative (04/27 1146)  HIV: Non Reactive (08/15 1539)  GC/Chlamydia:  Neisseria Gonorrhea  Date Value Ref Range Status  01/11/2022 Negative  Final   Chlamydia  Date Value Ref Range Status  01/11/2022 Negative  Final    GBS: Negative/-- (10/05 1500)  2-hr GTT: abnormal Genetic screening:  low risk Anatomy US: IUGR  Prenatal Transfer Tool  Maternal Diabetes: Yes:  Diabetes Type:  Insulin/Medication controlled Genetic Screening: Normal Maternal Ultrasounds/Referrals: IUGR Fetal Ultrasounds or other Referrals:  Other: IUGR <1%, abnormal cord dopplers Maternal Substance Abuse:  No Significant Maternal Medications:  Meds include: Other: Metformin, amlodipine, propranolol Significant Maternal Lab Results: Group B Strep negative and Rh negative  Results for orders placed or performed during the hospital encounter of 01/18/22 (from the past 24 hour(s))  CBC   Collection Time: 01/18/22  5:05 PM  Result Value Ref Range   WBC 10.4 4.0 - 10.5 K/uL   RBC 4.18 3.87 - 5.11 MIL/uL   Hemoglobin 12.2 12.0 - 15.0 g/dL   HCT 36.8 36.0 - 46.0 %   MCV 88.0 80.0 - 100.0 fL   MCH 29.2 26.0 - 34.0 pg   MCHC 33.2 30.0 - 36.0 g/dL   RDW 14.6 11.5 - 15.5 %   Platelets 262 150 - 400 K/uL   nRBC 0.0 0.0 - 0.2 %  Type and screen   Collection Time: 01/18/22  5:05 PM  Result Value Ref Range   ABO/RH(D) PENDING    Antibody Screen PENDING    Sample Expiration      01/21/2022,2359 Performed at [facility], [place] [address] [place], [place] [address]     Patient Active Problem List   Diagnosis Date Noted  Transverse presentation, antepartum 01/18/2022   Fetal growth restriction antepartum 12/21/2021   Vaginal bleeding during pregnancy 11/03/2021   Right fetal pelviectasis 11/03/2021   Postural dizziness with presyncope 10/24/2021   Palpitations 10/24/2021    Placenta previa, second trimester 09/04/2021   Gestational diabetes 08/14/2021   Pre-diabetes 08/04/2021   Chronic hypertension affecting pregnancy 08/03/2021   Supervision of high-risk pregnancy 08/02/2021   MDD (major depressive disorder), recurrent severe, without psychosis ([place]) 09/30/2020   Panic attacks 09/30/2020   Social anxiety disorder 09/30/2020   Adjustment disorder with mixed anxiety and depressed mood 09/30/2020   Abnormal ECG 08/08/2015   Poor dentition 07/19/2015   AMA (advanced maternal age) multigravida 35+ 06/21/2015   Grand multipara 06/21/2015   Rh negative state in antepartum period 06/21/2015   Anxiety 03/05/2012    Assessment: [name] is a 41 y.o. [id number] at 78w1dhere for scheduled CS.  #Primary Cesarean section due to transverse presentation and non-reassuring fetal status #IUGR #Abnormal UADs #Low lying placenta Delivery indicated due to BPP 4/8 in setting of abnormal cord dopplers and severe growth restriction in addition is currently malpresented. Patient reaffirmed her desire for permanent sterilization as well, would like bilateral salpingectomy if technically feasible.    The risks of cesarean section were discussed with the patient including but were not limited to: bleeding which may require transfusion or reoperation; infection which may require antibiotics; injury to bowel, bladder, ureters or other surrounding organs; injury to the fetus; need for additional procedures including hysterectomy in the event of a life-threatening hemorrhage; placental abnormalities wth subsequent pregnancies, incisional problems, thromboembolic phenomenon and other postoperative/anesthesia complications.  Patient also desires permanent sterilization.  Other reversible forms of contraception were discussed with patient; she declines all other modalities. Risks of procedure discussed with patient including but not limited to: risk of regret, permanence of method,  bleeding, infection, injury to surrounding organs and need for additional procedures.  Failure risk of about 1% with increased risk of ectopic gestation if pregnancy occurs was also discussed with patient.  Also discussed possibility of post-tubal pain syndrome. The patient concurred with the proposed plan, giving informed written consent for the procedures.  Patient has been NPO since around 1245 PM. Anesthesia and OR aware.  Preoperative prophylactic antibiotics and SCDs ordered on call to the OR.    #Anesthesia: Spinal #FWB: Initially Cat I, however around 1750 had 2 min decel while in MAU. Will expedite delivery. #GBS: negative #MOF: breast and bottle #MOC: bilateral salpingectomy #Circ: yes  #GDM: check AM fasting CBG  #cHTN: severe range BP's on arrival followed by very normal BP's. Deferred meds/Mg at this time but will monitor closely as she is at high risk for superimposed PreE.  #Rh neg: rhogam eval PP  #Anxiety: SW consult PP   MClarnce Flock10/03/2022, 5:48 PM

## 2022-01-18 NOTE — Op Note (Addendum)
[name]  PROCEDURE DATE: 01/18/2022  PREOPERATIVE DIAGNOSES: Intrauterine pregnancy at [redacted]w[redacted]d weeks gestation; malpresentation: transverse lie and FGR with absent End diastolic blood flow ; undesired fertility  POSTOPERATIVE DIAGNOSES: The same  PROCEDURE: Primary Low Transverse Cesarean Section, Bilateral Tubal Sterilization by salpingectomy  SURGEON:  Dr Myna Hidalgo  ASSISTANT:  Dr Sheppard Evens.  An experienced assistant was required given the standard of surgical care given the complexity of the case.  This assistant was needed for exposure, dissection, suctioning, retraction, instrument exchange, assisting with delivery with administration of fundal pressure, and for overall help during the procedure.  ANESTHESIOLOGIST: Dr. Jaynie Collins  INDICATIONS: [name] is a 41 y.o. [id number] at [redacted]w[redacted]d here for cesarean section and bilateral tubal sterilization secondary to the indications listed under preoperative diagnoses; please see preoperative note for further details.    FINDINGS:  Viable female infant in breech presentation.  Apgars 3 and 9.  Clear amniotic fluid.  Intact anterior and low lying placenta, three vessel cord.  Normal uterus, fallopian tubes and ovaries bilaterally.   ANESTHESIA: Spinal INTRAVENOUS FLUIDS: 1500 ml ESTIMATED BLOOD LOSS: 607 ml URINE OUTPUT:  250 ml SPECIMENS: Placenta sent to pathology  COMPLICATIONS: None immediate  PROCEDURE IN DETAIL:  The patient preoperatively received intravenous antibiotics and had sequential compression devices applied to her lower extremities.   She was then taken to the operating room where spinal anesthesia was administered and was found to be adequate. She was then placed in a dorsal supine position with a leftward tilt, and prepped and draped in a sterile manner.  A foley catheter was placed into her bladder and attached to constant gravity.  After an adequate timeout was performed, a Pfannenstiel skin incision  was made with scalpel and carried through to the underlying layer of fascia. The fascia was incised in the midline, and this incision was extended bilaterally using the Mayo scissors.  Kocher clamps were applied to the superior aspect of the fascial incision and the underlying rectus muscles were dissected off bluntly. A similar process was carried out on the inferior aspect of the fascial incision. The rectus muscles were separated in the midline bluntly and the peritoneum was entered bluntly. Attention was turned to the lower uterine segment where a low transverse hysterotomy was made with a scalpel and extended bilaterally bluntly.  The infant was delivered from frank breech presentation.  The legs were individually flexed and delivered.  The sacrum was rotated, each arm was flexed and delivered.  The head was flexed and delivered atraumatically.  The cord was clamped and cut and the infant was handed over to awaiting neonatology team. Uterine massage was then administered, and the placenta delivered intact with a three-vessel cord. A small portion of fetal membranes were noted and removed without complications.  The uterus was then cleared of clot and debris.  The hysterotomy was closed with 0 Vicryl in a running unlocked fashion, and an imbricating layer was also placed with 0 Vicryl for hemostasis. Figure-of-eight serosal stitches were placed to help with hemostasis.  Attention was then turned to the fallopian tubes, The right Fallopian tube was identified by tracing out to the fimbraie, grasped with the Babcock clamps. The distal third portion of the tube and mesosalpinx was doubly clamped using Kelly clamps and the distal portion of the tube removed using Metzenbaum scissors. The remaining pedicle was tied off using a free tie of plain gut and a subsequent suture ligation with 2-0 Vicryl. The pedicle was inspected and  found to be hemostatic.  A similar process was carried out on the right side allowing for  bilateral tubal sterilization.  Good hemostasis was noted overall. The pelvis was cleared of all clot and debris. Hemostasis was confirmed on all surfaces.  The fascia was then closed using 0 Vicryl in a running fashion.  The subcutaneous layer was irrigated, then reapproximated with 2-0 plain gut continuous stitches. The skin was closed with a 4-0 Vicryl subcuticular stitch. The patient tolerated the procedure well. Sponge, lap, instrument and needle counts were correct x 2.  She was taken to the recovery room in stable condition.   Sandra Sherrilyn Rist, MD, MPH OB [place] for [place] of Attending Supervision of Obstetric Fellow: Evaluation and management procedures were performed by the Obstetric Fellow under my supervision and collaboration.  I have reviewed the Obstetric Fellow's note and chart, and I agree with the management and plan. I have also made any necessary editorial changes.   Sandra Genta, DO Attending [place], [facility] for [facility], [facility] 01/18/2022 9:18 PM

## 2022-01-18 NOTE — Progress Notes (Signed)
[place] 36+1 wks,complete breech,anterior placenta gr 3,low lying placenta,FHR 146 bpm,AFI  5.5 cm,BPP 4/8 (decreased fetal movement and absent tone),elevated UAD 99% with absent EDF,discussed results with Dr Nelda [place]se[place]

## 2022-01-18 NOTE — Plan of Care (Signed)
  Problem: Education: Goal: Knowledge of General Education information will improve Description: Including pain rating scale, medication(s)/side effects and non-pharmacologic comfort measures Outcome: Completed/Met   Problem: Health Behavior/Discharge Planning: Goal: Ability to manage health-related needs will improve Outcome: Completed/Met   Problem: Clinical Measurements: Goal: Ability to maintain clinical measurements within normal limits will improve Outcome: Completed/Met Goal: Will remain free from infection Outcome: Completed/Met Goal: Diagnostic test results will improve Outcome: Completed/Met Goal: Respiratory complications will improve Outcome: Completed/Met Goal: Cardiovascular complication will be avoided Outcome: Completed/Met   Problem: Activity: Goal: Risk for activity intolerance will decrease Outcome: Completed/Met   Problem: Nutrition: Goal: Adequate nutrition will be maintained Outcome: Completed/Met   Problem: Coping: Goal: Level of anxiety will decrease Outcome: Completed/Met   Problem: Elimination: Goal: Will not experience complications related to bowel motility Outcome: Completed/Met Goal: Will not experience complications related to urinary retention Outcome: Completed/Met   Problem: Pain Managment: Goal: General experience of comfort will improve Outcome: Completed/Met   Problem: Safety: Goal: Ability to remain free from injury will improve Outcome: Completed/Met   Problem: Skin Integrity: Goal: Risk for impaired skin integrity will decrease Outcome: Completed/Met   Problem: Education: Goal: Ability to describe self-care measures that may prevent or decrease complications (Diabetes Survival Skills Education) will improve Outcome: Completed/Met Goal: Individualized Educational Video(s) Outcome: Completed/Met   Problem: Coping: Goal: Ability to adjust to condition or change in health will improve Outcome: Completed/Met   Problem:  Fluid Volume: Goal: Ability to maintain a balanced intake and output will improve Outcome: Completed/Met   Problem: Health Behavior/Discharge Planning: Goal: Ability to identify and utilize available resources and services will improve Outcome: Completed/Met Goal: Ability to manage health-related needs will improve Outcome: Completed/Met   Problem: Metabolic: Goal: Ability to maintain appropriate glucose levels will improve Outcome: Completed/Met   Problem: Nutritional: Goal: Maintenance of adequate nutrition will improve Outcome: Completed/Met Goal: Progress toward achieving an optimal weight will improve Outcome: Completed/Met   Problem: Skin Integrity: Goal: Risk for impaired skin integrity will decrease Outcome: Completed/Met   Problem: Tissue Perfusion: Goal: Adequacy of tissue perfusion will improve Outcome: Completed/Met   Problem: Education: Goal: Knowledge of the prescribed therapeutic regimen will improve Outcome: Completed/Met Goal: Individualized Educational Video(s) Outcome: Completed/Met   Problem: Self-Concept: Goal: Communication of feelings regarding changes in body function or appearance will improve Outcome: Completed/Met   Problem: Skin Integrity: Goal: Demonstration of wound healing without infection will improve Outcome: Completed/Met

## 2022-01-19 ENCOUNTER — Encounter (HOSPITAL_COMMUNITY): Payer: Self-pay | Admitting: Obstetrics & Gynecology

## 2022-01-19 LAB — CBC
HCT: 28 % — ABNORMAL LOW (ref 36.0–46.0)
Hemoglobin: 9.1 g/dL — ABNORMAL LOW (ref 12.0–15.0)
MCH: 29 pg (ref 26.0–34.0)
MCHC: 32.5 g/dL (ref 30.0–36.0)
MCV: 89.2 fL (ref 80.0–100.0)
Platelets: 226 10*3/uL (ref 150–400)
RBC: 3.14 MIL/uL — ABNORMAL LOW (ref 3.87–5.11)
RDW: 14.7 % (ref 11.5–15.5)
WBC: 13.3 10*3/uL — ABNORMAL HIGH (ref 4.0–10.5)
nRBC: 0 % (ref 0.0–0.2)

## 2022-01-19 LAB — GLUCOSE, CAPILLARY: Glucose-Capillary: 100 mg/dL — ABNORMAL HIGH (ref 70–99)

## 2022-01-19 LAB — RPR: RPR Ser Ql: NONREACTIVE

## 2022-01-19 MED ORDER — DIPHENHYDRAMINE HCL 50 MG/ML IJ SOLN: 25.0000 mg | Freq: Once | INTRAMUSCULAR | Status: DC | PRN

## 2022-01-19 MED ORDER — OXYCODONE HCL 5 MG/5ML PO SOLN
5.0000 mg | ORAL | Status: DC | PRN
  Administered 2022-01-19 – 2022-01-21 (×7): 5 mg via ORAL
  Filled 2022-01-19 (×7): qty 5

## 2022-01-19 MED ORDER — SODIUM CHLORIDE 0.9 % IV SOLN
500.0000 mg | Freq: Once | INTRAVENOUS | Status: AC
  Administered 2022-01-19: 500 mg via INTRAVENOUS
  Filled 2022-01-19: qty 500

## 2022-01-19 MED ORDER — ALBUTEROL SULFATE (2.5 MG/3ML) 0.083% IN NEBU: 2.5000 mg | INHALATION_SOLUTION | Freq: Once | RESPIRATORY_TRACT | Status: DC | PRN

## 2022-01-19 MED ORDER — RHO D IMMUNE GLOBULIN 1500 UNIT/2ML IJ SOSY
300.0000 ug | PREFILLED_SYRINGE | Freq: Once | INTRAMUSCULAR | Status: AC
  Administered 2022-01-19: 300 ug via INTRAVENOUS
  Filled 2022-01-19: qty 2

## 2022-01-19 MED ORDER — COMPLETENATE 29-1 MG PO CHEW
1.0000 | CHEWABLE_TABLET | Freq: Every day | ORAL | Status: DC
  Administered 2022-01-19 – 2022-01-20 (×2): 1 via ORAL
  Filled 2022-01-19 (×2): qty 1

## 2022-01-19 MED ORDER — EPINEPHRINE PF 1 MG/ML IJ SOLN: 0.3000 mg | Freq: Once | INTRAMUSCULAR | Status: DC | PRN

## 2022-01-19 MED ORDER — SODIUM CHLORIDE 0.9 % IV SOLN: INTRAVENOUS | Status: DC | PRN

## 2022-01-19 MED ORDER — IBUPROFEN 100 MG/5ML PO SUSP
600.0000 mg | Freq: Four times a day (QID) | ORAL | Status: DC
  Administered 2022-01-19 – 2022-01-21 (×6): 600 mg via ORAL
  Filled 2022-01-19 (×6): qty 30

## 2022-01-19 MED ORDER — ACETAMINOPHEN 160 MG/5ML PO SOLN
650.0000 mg | ORAL | Status: DC | PRN
  Administered 2022-01-19 – 2022-01-21 (×5): 650 mg via ORAL
  Filled 2022-01-19 (×5): qty 20.3

## 2022-01-19 MED ORDER — SODIUM CHLORIDE 0.9 % IV BOLUS: 500.0000 mL | Freq: Once | INTRAVENOUS | Status: DC | PRN

## 2022-01-19 MED ORDER — INFLUENZA VAC SPLIT QUAD 0.5 ML IM SUSY
0.5000 mL | PREFILLED_SYRINGE | INTRAMUSCULAR | Status: AC
  Administered 2022-01-20: 0.5 mL via INTRAMUSCULAR
  Filled 2022-01-19: qty 0.5

## 2022-01-19 MED ORDER — METHYLPREDNISOLONE SODIUM SUCC 125 MG IJ SOLR: 125.0000 mg | Freq: Once | INTRAMUSCULAR | Status: DC | PRN

## 2022-01-19 NOTE — Clinical Social Work Maternal (Signed)
CLINICAL SOCIAL WORK MATERNAL/CHILD NOTE  Patient Details  Name: [name] MRN: [id number] Date of Birth: 27-Aug-1980  Date:  01/19/2022  Clinical Social Worker Initiating Note:  Laurey Arrow Date/Time: Initiated:  01/19/22/1212     Child's Name:  [name]   Biological Parents:  Mother (MOB declined to provide any information about FOB and reported that FOB has not been involved.)   Need for Interpreter:  None   Reason for Referral:  Behavioral Health Concerns, Parental Support of Premature Babies < 32 weeks/or Critically Ill babies   Address:  [address] [place] [address]    Phone number:  [phone] (home)     Additional phone number:   Household Members/Support Persons (HM/SP):   Household Member/Support Person 1, Household Member/Support Person 2, Household Member/Support Person 3, Household Member/Support Person 4, Household Member/Support Person 5   HM/SP Name Relationship DOB or Age  HM/SP -31 Ermalinda Memos daughter 01/24/04  HM/SP -2 [place] daughter 03/09/2005  HM/SP -[place] daughter 08/18/2007  HM/SP -[place] daughter 2/1/014  HM/SP -5 Candis Musa      HM/SP -6        HM/SP -7        HM/SP -8          Natural Supports (not living in the home):  Immediate Family, Extended Family, Parent, Other (Comment) (Older Children)   Professional Supports: None   Employment: Unemployed   Type of Work:     Education:  9 to 11 years   Homebound arranged: No (MOB reported she completed 10th grade.)  Financial Resources:  [employer]   Other Resources:  [employer], Physicist, medical     Cultural/Religious Considerations Which May Impact Care:  None reported  Strengths:  Ability to meet basic needs  , Pediatrician chosen   Psychotropic Medications:         Pediatrician:    [place]  Pediatrician List:   [place]      Pediatrician Fax Number:    Risk Factors/Current Problems:  Mental Health Concerns     Cognitive State:  Linear Thinking  , Able to Concentrate  , Alert  , Insightful  , Goal Oriented     Mood/Affect:  Relaxed  , Comfortable  , Bright  , Happy     CSW Assessment: CSW meet with MOB at infant's bedside in room 314 to complete an assessment for mental health hx and NICU admission.  When CSW arrived, MOB and MOB's parents were observing infant in his isolette; everyone appeared happy an comfortable. CSW explained CSW's role an MOB gave CSW permission to complete clinical assessment while MOB's parents were present. MOB's father left and MOB's mother engage with CSw.  MOB's parents appears to be a good support to MOB and family. MOB appeared happy, polite, easy to engage and receptive to meeting with CSW.   CSW inquired about MOB's MH and MOB acknowledged a hx of anxiety and depression and reported that she was dx in 2015/2016.  MOB denied experiencing any PMAD symptoms after the birth of all her children however she acknowledged a hospital inpatient stay in 2022 after the break up with her ex-boyfriend. CSW educated MOB about PMADs. CSW informed MOB of possible supports and interventions to decrease PPD.  CSW also encouraged MOB to seek medical attention  if needed for increased signs and symptoms of PPD.  CSW also offered MOB resources for outpatient behavioral health services and MOB declined. CSW encouraged MOB to evaluate her mental health throughout the postpartum period with the use of the New Mom Checklist developed by Postpartum Progress and notify a medical professional if symptoms arise; MOB agreed. MOB presented with insight and awareness and denied SI, HI, and DV when assessed for safety. MOB reported having a good support team that will be willing to help if needed. CSW reviewed safe sleep and SIDS. MOB and was knowledgeable and asked appropriate  questions. MOB communicated that MOB has everything she needs for the baby and is prepared to meet her infant's needs.    CSW reviewed infant's eligibility for SSI benefits and MOB expressed interest in applying. CSW reviewed application process and encouraged MOB to reach ou tot CSW if any additional questions or concerns arise. MOB did not have any further questions, concerns, or needs currently.  CSW thanked MOB for allowing CSW to meet with her.  CSW will continue to offer resources and supports to family while infant remains in NICU.   CSW Plan/Description:  Psychosocial Support and Ongoing Assessment of Needs, Sudden Infant Death Syndrome (SIDS) Education, Perinatal Mood and Anxiety Disorder (PMADs) Education, Other Patient/Family Education, Other Information/Referral to [employer], MSW, [employer] Clinical Social Work [phone]   Dimple Nanas, LCSW 01/19/2022, 1:47 PM

## 2022-01-19 NOTE — Progress Notes (Signed)
Post-Op Day , 1 CS for malpresentation, abnormal dopplers  Subjective: No complaints, up ad lib, voiding and tolerating PO, passing flatus,small lochia,.plans to bottle feed, bilateral tubal ligation  Objective: Blood pressure 105/68, pulse 87, temperature 97.8 F (36.6 C), temperature source Oral, resp. rate 18, height 5\' 2"  (1.575 m), weight 105.3 kg, last menstrual period 05/03/2021, SpO2 97 %, unknown if currently breastfeeding.  Physical Exam:  General: alert, cooperative and no distress Lochia:normal flow Chest: CTAB Heart: RRR no m/r/g Abdomen: +BS, soft, nontender, dsg c/d/intact, no erythema Uterine Fundus: firm DVT Evaluation: No evidence of DVT seen on physical exam. Extremities: no edema  Recent Labs    01/18/22 1705 01/19/22 0546  HGB 12.2 9.1*  HCT 36.8 28.0*    Assessment/Plan: Anemia 2/2 blood loss_IV Venofer today    LOS: 1 day   Christin Fudge 01/19/2022, 7:37 AM

## 2022-01-19 NOTE — Lactation Note (Signed)
This note was copied from a baby's chart.  NICU Lactation Consultation Note  Patient Name: [name] YNWGN'F Date: 01/19/2022 Age:42 hours   Subjective Reason for consult: Initial assessment Mother bf her other children for 10-11 mo each. She intends to pump/bf this infant. Mother has called her insurance provider regarding breast pump. She is also aware she can receive a pump [facility]. We also discussed the hospital loaner pump option if she discharges over the weekend and needs a pump for at-home use.  RN on MBU set up pump and provided ed, per mom. We reviewed pumping frequency and storage of milk.   Objective Infant data: Mother's Current Feeding Choice: Breast Milk  Infant feeding assessment Scale for Readiness: 2   Maternal data: [id number]  C-Section, Low Transverse  Does the patient have breastfeeding experience prior to this delivery?: Yes How long did the patient breastfeed?: 10-11 mo.  [place] Program: Yes [facility])  Assessment Infant: Feeding Status: NPO   Intervention/Plan Interventions: Education  Tools: Pump Pump Education: Setup, frequency, and cleaning; Milk Storage (Education and set up by RN)  Plan: Consult Status: NICU follow-up  NICU Follow-up type: New admission follow up  Pump q3h and bring any EBM to NICU LC to f/u on receipt of insurance pump  Gwynne Edinger 01/19/2022, 12:35 PM

## 2022-01-20 LAB — RH IG WORKUP (INCLUDES ABO/RH)
Fetal Screen: NEGATIVE
Gestational Age(Wks): 36.1
Unit division: 0

## 2022-01-20 LAB — GLUCOSE, CAPILLARY: Glucose-Capillary: 86 mg/dL (ref 70–99)

## 2022-01-20 MED ORDER — LIDOCAINE-PRILOCAINE 2.5-2.5 % EX CREA
TOPICAL_CREAM | Freq: Once | CUTANEOUS | Status: AC
  Filled 2022-01-20: qty 5

## 2022-01-20 MED ORDER — PROPRANOLOL HCL 10 MG PO TABS
10.0000 mg | ORAL_TABLET | Freq: Two times a day (BID) | ORAL | Status: DC
  Administered 2022-01-20 – 2022-01-21 (×3): 10 mg via ORAL
  Filled 2022-01-20 (×4): qty 1

## 2022-01-20 NOTE — Lactation Note (Signed)
This note was copied from a baby's chart.  NICU Lactation Consultation Note  Patient Name: Boy Nemesis Rainwater PTWSF'K Date: 01/20/2022 Age:42 hours  Subjective Reason for consult: Follow-up assessment; Maternal endocrine disorder; Other (Comment); NICU baby; Infant < 6lbs (AMA, IUGR)  Visited with family of 42 87/72 weeks old (adjusted) NICU female, Ms. Berhe is a grand multipara and reported she's already pumping but not consistently. Explained the importance of consistent pumping for the onset of lactogenesis II and to protect her supply, she voiced understanding. Reviewed pumping schedule, lactogenesis II and anticipatory guidelines.  Objective Infant data: Mother's Current Feeding Choice: Breast Milk and Donor Milk  Infant feeding assessment Scale for Readiness: 2 Scale for Quality: 2  Maternal data: [id number]  C-Section, Low Transverse Current breast feeding challenges:: NICU admission Does the patient have breastfeeding experience prior to this delivery?: Yes How long did the patient breastfeed?: 10-11 mo. Pumping frequency: 1 time/24 hours Pumped volume: 15 mL Flange Size: 24 Risk factor for low milk supply:: infrequent pumping on 01/20/22 WIC Program: Yes ([place])  Assessment Infant: Feeding Status: NPO  Maternal: Milk volume: Normal  Intervention/Plan Interventions: Breast feeding basics reviewed; DEBP; Education  Tools: Pump; Flanges Pump Education: Setup, frequency, and cleaning; Milk Storage  Plan of care: Encouraged pumping every 3 hours, ideally 8 pumping sessions/24 hours She'll start turning her EBM to the NICU, she's already getting volume She'll consider the [facility] loaner option if she gets discharged on the weekend, we discussed it today  Female visitor present. All questions and concerns answered, family aware of [place] services and will call PRN.  Consult Status: NICU follow-up  NICU Follow-up type: Maternal D/C visit; Verify onset of copious milk;  Verify absence of engorgement   Nasya Vincent S Gunhild Bautch 01/20/2022, 4:52 PM

## 2022-01-20 NOTE — Progress Notes (Signed)
POSTPARTUM PROGRESS NOTE  POD #2  Subjective:  [name] is a 41 y.o. [id number] s/p pLTCS at [redacted]w[redacted]d. No acute events overnight. She reports she is doing well. She denies any problems with ambulating, voiding or po intake. Denies nausea or vomiting. She has passed flatus. Pain is NOT well controlled.  Lochia is adequate. Has R shoulder pain, worse with movement and pulling herself up.   Objective: Blood pressure 116/69, pulse 95, temperature 98 F (36.7 C), temperature source Oral, resp. rate 18, height 5\' 2"  (1.575 m), weight 105.3 kg, last menstrual period 05/03/2021, SpO2 96 %, unknown if currently breastfeeding.  Physical Exam:  General: alert, cooperative and no distress Chest: no respiratory distress Heart: regular rate, distal pulses intact Uterine Fundus: firm, appropriately tender DVT Evaluation: No calf swelling or tenderness Extremities: no edema Skin: warm, dry; incision clean/dry/intact w/ honeycomb dressing in place  Recent Labs    01/18/22 1705 01/19/22 0546  HGB 12.2 9.1*  HCT 36.8 28.0*    Assessment/Plan: [name] is a 41 y.o. [id number] s/p  pLTCS malpresentation with NRFWB at [redacted]w[redacted]d.  POD#2 - Doing welll; pain not controlled. H/H appropriate  Routine postpartum care  OOB, ambulated  Lovenox for VTE prophylaxis  Increase stool softener, add lidocaine cream for r shoulder pain Anemia: asymptomatic  Start po ferrous sulfate every other day Contraception: BTL done Feeding: bottle  Dispo: Plan for discharge tomorrow.   LOS: 2 days   Shelda Pal, DO OB Fellow  01/20/2022, 7:57 AM

## 2022-01-21 MED ORDER — IBUPROFEN 800 MG PO TABS: 800.0000 mg | ORAL_TABLET | Freq: Three times a day (TID) | ORAL | 0 refills | Status: DC | PRN

## 2022-01-21 MED ORDER — WITCH HAZEL-GLYCERIN EX PADS: 1.0000 | MEDICATED_PAD | CUTANEOUS | 12 refills | Status: DC | PRN

## 2022-01-21 MED ORDER — ACETAMINOPHEN 500 MG PO TABS: 500.0000 mg | ORAL_TABLET | Freq: Four times a day (QID) | ORAL | 0 refills | Status: AC | PRN

## 2022-01-21 MED ORDER — SENNOSIDES-DOCUSATE SODIUM 8.6-50 MG PO TABS: 2.0000 | ORAL_TABLET | Freq: Every day | ORAL | 0 refills | Status: DC

## 2022-01-21 MED ORDER — AMLODIPINE BESYLATE 5 MG PO TABS: 5.0000 mg | ORAL_TABLET | Freq: Every day | ORAL | 0 refills | Status: DC

## 2022-01-21 MED ORDER — FUROSEMIDE 20 MG PO TABS: 20.0000 mg | ORAL_TABLET | Freq: Every day | ORAL | 0 refills | Status: DC

## 2022-01-21 MED ORDER — IRON (FERROUS SULFATE) 325 (65 FE) MG PO TABS: 1.0000 | ORAL_TABLET | ORAL | 3 refills | Status: DC

## 2022-01-21 NOTE — Lactation Note (Signed)
This note was copied from a baby's chart.  NICU Lactation Consultation Note  Patient Name: [name] [id number]'K Date: 01/21/2022 Age:42 hours  Subjective Reason for consult: Follow-up assessment; NICU baby; Infant < 6lbs; Maternal endocrine disorder; Other (Comment); Maternal discharge (AMA. IUGR)  Visited with family of 4 29/23 weeks old (adjusted) NICU female, [name] is a grand multipara and reported she's pumping but still not consistently. Explained the importance of consistent pumping for the onset of lactogenesis II and the prevention of engorgement, she voiced understanding. She's getting discharged today. Reviewed discharge education, pump settings and anticipatory guidelines. [name] is still undecided about the [facility] loaner, asked her to page lactation if she decides to go with that option (it was recommended) instead of the hand pump.  Objective Infant data: Mother's Current Feeding Choice: Breast Milk and Donor Milk  Infant feeding assessment Scale for Readiness: 2 Scale for Quality: 2  Maternal data: [id number]  C-Section, Low Transverse Current breast feeding challenges:: NICU admission Pumping frequency: 2 times/24 hours Pumped volume: 14 mL Flange Size: 24 Risk factor for low milk supply:: infrequent pumping on 01/20/22 WIC Program: Yes ([place])  Assessment Infant: In NICU  Maternal: Milk volume: Normal  Intervention/Plan Interventions: Breast feeding basics reviewed; DEBP; Education Tools: Pump; Flanges Pump Education: Setup, frequency, and cleaning; Milk Storage  Plan of care: Encouraged pumping every 3 hours, ideally 8 pumping sessions/24 hours She'll take all pump parts to baby's room after her discharge She'll switch her pump settings from initiation to expression mode once she starts getting 20 ml of EBM combined She'll call the [facility] office on Monday to set up her appt for a WIC pump   GOB (maternal) present and very supportive. All  questions and concerns answered, family aware of [place] services and will call PRN.  Consult Status: NICU follow-up  NICU Follow-up type: Verify onset of copious milk; Verify absence of engorgement; Weekly NICU follow up   [employer] 01/21/2022, 12:14 PM

## 2022-01-22 ENCOUNTER — Ambulatory Visit: Payer: Self-pay

## 2022-01-22 ENCOUNTER — Other Ambulatory Visit: Payer: Medicaid Other

## 2022-01-22 LAB — BPAM RBC
Blood Product Expiration Date: 202310192359
Blood Product Expiration Date: 202310192359
Unit Type and Rh: 600
Unit Type and Rh: 9500

## 2022-01-22 LAB — TYPE AND SCREEN
ABO/RH(D): A NEG
Antibody Screen: POSITIVE
Unit division: 0
Unit division: 0

## 2022-01-22 NOTE — Lactation Note (Signed)
This note was copied from a baby's chart.  NICU Lactation Consultation Note  Patient Name: [name] JOINO'M Date: 01/22/2022 Age:42 days   Subjective Reason for consult: Follow-up assessment Mother continues to pump with hand pump while awaiting a [facility] appointment for electric pump on Friday. She denies s/s of engorgement.   Objective Infant data: Mother's Current Feeding Choice: Breast Milk and Donor Milk  Infant feeding assessment Scale for Readiness: 2 Scale for Quality: 2     Maternal data: [id number]  C-Section, Low Transverse Pumping frequency: 2 times/24 hours Pumped volume: 30 mL Flange Size: 24  WIC Program: Yes ([place])  Assessment Maternal: Milk volume: Normal   Intervention/Plan Interventions: Education  Tools: Pump; Flanges Pump Education: Setup, frequency, and cleaning; Milk Storage  Plan: Consult Status: NICU follow-up  NICU Follow-up type: Weekly NICU follow up (verify receipt of at-home pump)  F/u with [facility] for electric pump. Pump in NICU when possible and use hand pump at home.  Gwynne Edinger 01/22/2022, 12:23 PM

## 2022-01-25 ENCOUNTER — Inpatient Hospital Stay (HOSPITAL_COMMUNITY)
Admission: AD | Admit: 2022-01-25 | Payer: Medicaid Other | Source: Home / Self Care | Admitting: Obstetrics and Gynecology

## 2022-01-25 ENCOUNTER — Inpatient Hospital Stay (HOSPITAL_COMMUNITY): Payer: Medicaid Other

## 2022-01-25 ENCOUNTER — Other Ambulatory Visit: Payer: Medicaid Other

## 2022-01-25 ENCOUNTER — Encounter: Payer: Medicaid Other | Admitting: Obstetrics & Gynecology

## 2022-01-26 LAB — SURGICAL PATHOLOGY

## 2022-01-27 ENCOUNTER — Telehealth (HOSPITAL_COMMUNITY): Payer: Self-pay | Admitting: *Deleted

## 2022-01-27 ENCOUNTER — Ambulatory Visit: Payer: Self-pay

## 2022-01-27 NOTE — Lactation Note (Signed)
This note was copied from a baby's chart.  NICU Lactation Consultation Note  Patient Name: [name] RXVQM'G Date: 01/27/2022 Age:42 days  Subjective Reason for consult: Follow-up assessment; NICU baby; Late-preterm 34-36.6wks; Infant < 6lbs; Maternal endocrine disorder  Visited with family of 56 6/20 weeks old (adjusted ) NICU female, [name] is a grand multipara and reports that she's pumping but not consistently, she got emotional when talking about the challenges of having a baby in NICU and dividing her time with her other kids at home. Provided emotional support and let her know that we are here to support her feeding choices and her baby in any way we can. She voiced she picked up her pump from [facility] on 01/23/22.   Objective Infant data: Mother's Current Feeding Choice: Breast Milk and Donor Milk  Infant feeding assessment Scale for Readiness: 1 Scale for Quality: 2  Maternal data: [id number]  C-Section, Low Transverse Pumping frequency: 4-5 times/24 hours Pumped volume: 50 mL (up to 60 ml) Flange Size: 24 WIC Program: Yes ([place]) Pump: Personal (WIC pump)  Assessment Infant: In NICU  Maternal: Milk volume: Low  Intervention/Plan Interventions: Breast feeding basics reviewed; DEBP; Education Tools: Pump; Flanges Pump Education: Setup, frequency, and cleaning; Milk Storage  Plan of care: Encouraged pumping every 3 hours, ideally 8 pumping sessions/24 hours but she'll go at her own pace if pumping becomes too stressful  She'll call for assistance if she decides to take baby to breast; he's advancing towards PO feedings (bottle)   Female visitor present. All questions and concerns answered, family aware of [place] services and will call PRN.  Consult Status: NICU follow-up  NICU Follow-up type: Weekly NICU follow up (Mom still undecided about taking baby to breast at this point)   Lenise Herald 01/27/2022, 5:53 PM

## 2022-01-27 NOTE — Telephone Encounter (Signed)
Left phone voicemail message.  Odis Hollingshead, RN 01-27-2022 at 1:18pm

## 2022-01-29 ENCOUNTER — Other Ambulatory Visit: Payer: Medicaid Other

## 2022-01-30 ENCOUNTER — Ambulatory Visit: Payer: Self-pay

## 2022-01-30 ENCOUNTER — Ambulatory Visit (INDEPENDENT_AMBULATORY_CARE_PROVIDER_SITE_OTHER): Payer: Medicaid Other | Admitting: Women's Health

## 2022-01-30 ENCOUNTER — Encounter: Payer: Self-pay | Admitting: Women's Health

## 2022-01-30 VITALS — BP 117/84 | HR 89 | Ht 62.0 in | Wt 218.4 lb

## 2022-01-30 DIAGNOSIS — Z4889 Encounter for other specified surgical aftercare: Secondary | ICD-10-CM

## 2022-01-30 NOTE — Progress Notes (Signed)
   GYN VISIT Patient name: [name] MRN [id number]  Date of birth: February 21, 1981 Chief Complaint:   Routine Post Op  History of Present Illness:   [name] is a 41 y.o. [id number] Caucasian female 12d s/p PCS & bilateral salpingectomy being seen today for incision and bp check. On propranolol from ob cards, hasn't taken amlodipine in 2d. Sore soreness in incision and bilateral sides. Baby in NICU d/t prematurity and FGR. Pumping breastmilk, but not a lot.  No LMP recorded.    08/03/2021   10:21 AM 01/19/2020   10:54 AM 12/31/2017    8:59 AM 11/01/2017   10:06 AM 09/19/2017    8:38 AM  Depression screen PHQ 2/9  Decreased Interest 0 0 0 0 0  Down, Depressed, Hopeless 1 0 0 0 1  PHQ - 2 Score 1 0 0 0 1  Altered sleeping 0 0 0 0 0  Tired, decreased energy 1 0 1 0 0  Change in appetite 0 0     Feeling bad or failure about yourself  0 1 0 1 0  Trouble concentrating 0 0 0 0 0  Moving slowly or fidgety/restless 0 0     Suicidal thoughts 0 0 0 0 0  PHQ-9 Score 2 1 1 1 1         08/03/2021   10:21 AM 01/19/2020   10:55 AM  GAD 7 : Generalized Anxiety Score  Nervous, Anxious, on Edge 1 0  Control/stop worrying 0 0  Worry too much - different things 0 1  Trouble relaxing 0 0  Restless 0 0  Easily annoyed or irritable 0 0  Afraid - awful might happen 0 0  Total GAD 7 Score 1 1     Review of Systems:   Pertinent items are noted in HPI Denies fever/chills, dizziness, headaches, visual disturbances, fatigue, shortness of breath, chest pain, abdominal pain, vomiting, abnormal vaginal discharge/itching/odor/irritation, problems with periods, bowel movements, urination, or intercourse unless otherwise stated above.  Pertinent History Reviewed:  Reviewed past medical,surgical, social, obstetrical and family history.  Reviewed problem list, medications and allergies. Physical Assessment:   Vitals:   01/30/22 1201  BP: 117/84  Pulse: 89  Weight: 218 lb 6.4 oz (99.1 kg)  Height:  5\' 2"  (1.575 m)  Body mass index is 39.95 kg/m.       Physical Examination:   General appearance: alert, well appearing, and in no distress  Mental status: alert, oriented to person, place, and time  Skin: warm & dry   Cardiovascular: normal heart rate noted  Respiratory: normal respiratory effort, no distress  Abdomen: soft, non-tender, incision clean & dry, no erythema/exudate/induration  Pelvic: examination not indicated  Extremities: no edema   Chaperone: N/A    No results found for this or any previous visit (from the past 24 hour(s)).  Assessment & Plan:  1) 12d s/p PCS and bilateral salpingectomy> healing well, keep clean and dry  2) Breastfeeding> infant in NICU, coming home today, gave tips to increase supply  3) CHTN> no meds prior to pregnancy, on propranolol from ob cards, no amlodipine in 2d, bp stable. Check daily at home, if >130/85 restart amlodipine  Meds: No orders of the defined types were placed in this encounter.   No orders of the defined types were placed in this encounter.   Return for cancel nurse bp check in 2d, keep pp visit as scheduled.  [place], [facility] 01/30/2022 12:35 PM

## 2022-01-30 NOTE — Patient Instructions (Signed)
Tips To Increase Milk Supply Lots of water! Enough so that your urine is clear Plenty of calories, if you're not getting enough calories, your milk supply can decrease Breastfeed/pump often, every 2-3 hours x 20-30mins Fenugreek 3 pills 3 times a day, this may make your urine smell like maple syrup Mother's Milk Tea Lactation cookies, google for the recipe Real oatmeal Body Armor sports drinks Greater Than hydration drink  

## 2022-01-30 NOTE — Lactation Note (Signed)
This note was copied from a baby's chart.  NICU Lactation Consultation Note  Patient Name: [name] YWVPX'T Date: 01/30/2022 Age:42 days  Subjective Reason for consult: Follow-up assessment; NICU baby; Early term 38-38.6wks; Maternal endocrine disorder; Infant < 6lbs  Visited with family of 31 45/23 weeks old (adjusted) NICU female; [name] is taking baby home today. Reviewed discharge education, supply/demand and infant feeding. Explained to importance of continuing pumping if she decides to take baby to breast, she understands that baby won't be able to fully empty the breast for the next 4-6 weeks and that pumping remains a necessary part of her routine if she wishes to continue providing with breastmilk. Offered to place an [facility] OP referral to follow up with Waynard Reeds. But [name] politely declined, she prefers to wait and see how things go at home. All questions and concerns answered, family to contact [facility] services PRN.  Objective Infant data: Mother's Current Feeding Choice: Breast Milk and Formula  Infant feeding assessment Scale for Readiness: 2 Scale for Quality: 2  Maternal data: [id number]  C-Section, Low Transverse Pumping frequency: 4-5 times/24 hours Pumped volume: 50 mL (up to 60 ml) Flange Size: 24 WIC Program: Yes ([place]) Pump: Personal (WIC pump)  Assessment Infant: Feeding Status: Ad lib  Maternal: Milk volume: Low  Intervention/Plan Interventions: Breast feeding basics reviewed; Education Tools: Pump; Flanges Pump Education: Setup, frequency, and cleaning; Milk Storage  Plan of care: Consult Status: Complete   [name] S [name] 01/30/2022, 4:07 PM

## 2022-02-01 ENCOUNTER — Other Ambulatory Visit: Payer: Medicaid Other

## 2022-02-01 ENCOUNTER — Encounter: Payer: Medicaid Other | Admitting: Obstetrics & Gynecology

## 2022-02-05 ENCOUNTER — Other Ambulatory Visit: Payer: Medicaid Other

## 2022-02-08 ENCOUNTER — Encounter: Payer: Medicaid Other | Admitting: Obstetrics & Gynecology

## 2022-02-08 ENCOUNTER — Other Ambulatory Visit: Payer: Medicaid Other

## 2022-02-08 DIAGNOSIS — N390 Urinary tract infection, site not specified: Secondary | ICD-10-CM | POA: Diagnosis not present

## 2022-02-08 DIAGNOSIS — Z299 Encounter for prophylactic measures, unspecified: Secondary | ICD-10-CM | POA: Diagnosis not present

## 2022-02-08 DIAGNOSIS — H6522 Chronic serous otitis media, left ear: Secondary | ICD-10-CM | POA: Diagnosis not present

## 2022-02-12 ENCOUNTER — Other Ambulatory Visit: Payer: Medicaid Other

## 2022-02-16 DIAGNOSIS — J329 Chronic sinusitis, unspecified: Secondary | ICD-10-CM | POA: Diagnosis not present

## 2022-02-16 DIAGNOSIS — Z299 Encounter for prophylactic measures, unspecified: Secondary | ICD-10-CM | POA: Diagnosis not present

## 2022-02-16 DIAGNOSIS — H6522 Chronic serous otitis media, left ear: Secondary | ICD-10-CM | POA: Diagnosis not present

## 2022-02-22 ENCOUNTER — Encounter: Payer: Self-pay | Admitting: Advanced Practice Midwife

## 2022-02-22 ENCOUNTER — Encounter: Payer: Self-pay | Admitting: *Deleted

## 2022-02-22 ENCOUNTER — Ambulatory Visit (INDEPENDENT_AMBULATORY_CARE_PROVIDER_SITE_OTHER): Payer: Medicaid Other | Admitting: Advanced Practice Midwife

## 2022-02-22 ENCOUNTER — Other Ambulatory Visit (HOSPITAL_COMMUNITY)
Admission: RE | Admit: 2022-02-22 | Discharge: 2022-02-22 | Disposition: A | Discharge status: Home / Self Care | Payer: Medicaid Other | Source: Ambulatory Visit | Attending: Advanced Practice Midwife | Admitting: Advanced Practice Midwife

## 2022-02-22 DIAGNOSIS — Z124 Encounter for screening for malignant neoplasm of cervix: Secondary | ICD-10-CM

## 2022-02-22 NOTE — Progress Notes (Signed)
Post Partum Visit Note   Chief Complaint:   Postpartum Care  History of Present Illness:   [name] is a 41 y.o. [phone] Caucasian female being seen today for a postpartum visit. She is 5 weeks postpartum following a primary cesarean section, low transverse incision at 36.1 gestational weeks. IOL: No, Anesthesia: spinal.  Laceration: n/a.  Complications: none. Inpatient contraception: yes BTL .   Pregnancy complicated by FGR, CHTN , BPP 4/8, elevated UAD w/absent EDF,  Tobacco use: no. Substance use disorder: no. Last pap smear: 11/20 and results were  negative . Next pap smear due: today No LMP recorded.  Postpartum course has been uncomplicated. Bleeding staining only. Bowel function is normal. Bladder function is normal. Urinary incontinence? No, fecal incontinence? No Patient is not sexually active. Last sexual activity: prior to birth.    Upstream - 02/22/22 1351       Pregnancy Intention Screening   Does the patient want to become pregnant in the next year? No    Does the patient's partner want to become pregnant in the next year? No    Would the patient like to discuss contraceptive options today? No      Contraception Wrap Up   Current Method Female Sterilization    End Method Female Sterilization    Contraception Counseling Provided No            The pregnancy intention screening data noted above was reviewed. Potential methods of contraception were discussed. The patient elected to proceed with Female Sterilization.  Edinburgh Postpartum Depression Screening: Negative  Edinburgh Postnatal Depression Scale - 02/22/22 1052       Edinburgh Postnatal Depression Scale:  In the Past 7 Days   I have been able to laugh and see the funny side of things. 0    I have looked forward with enjoyment to things. 0    I have blamed myself unnecessarily when things went wrong. 1    I have been anxious or worried for no good reason. 1    I have felt scared or panicky for no  good reason. 1    Things have been getting on top of me. 1    I have been so unhappy that I have had difficulty sleeping. 0    I have felt sad or miserable. 1    I have been so unhappy that I have been crying. 1    The thought of harming myself has occurred to me. 0    Edinburgh Postnatal Depression Scale Total 6            Baby's course has been complicated by FGR, and 2 week NICU stay . Baby is feeding by bottle. Infant has a pediatrician/family doctor? Yes.  Childcare strategy if returning to work/school: n/a.  Pt has material needs met for her and baby: Yes.   Review of Systems:   Pertinent items are noted in HPI Denies Abnormal vaginal discharge w/ itching/odor/irritation, headaches, visual changes, shortness of breath, chest pain, abdominal pain, severe nausea/vomiting, or problems with urination or bowel movements. Pertinent History Reviewed:  Reviewed past medical,surgical, obstetrical and family history.  Reviewed problem list, medications and allergies. OB History  Gravida Para Term Preterm AB Living  _0 SAB IAB Ectopic Multiple Live Births  2     0 7    # Outcome Date GA Lbr Len/2nd Weight Sex Delivery Anes PTL Lv  9 Preterm 01/18/22 [redacted]w[redacted]d 3 lb 13  oz (1.73 kg) M CS-LTranv Spinal  LIV  8 Term 01/24/16 73w0d19:54 / 00:09 7 lb 5.3 oz (3.325 kg) F Vag-Spont EPI N LIV     Complications: Gestational hypertension  7 SAB 02/01/15 628w0d       6 Term 05/10/12 395w4d:22 / 00:10 7 lb (3.175 kg) F Vag-Spont EPI N LIV     Birth Comments: none  5 Term 08/18/07 [id number] 6 oz (2.892 kg) F Vag-Spont EPI N LIV     Complications: Gestational diabetes  4 Term 03/09/05 38w060w0db 5 oz (3.317 kg) F Vag-Spont EPI N LIV  3 Term 01/24/04 [id number] 8 oz (2.948 kg) F Vag-Spont EPI N LIV  2 SAB 01/11/03 [redacted]w[redacted]d  [redacted]w[redacted]d  1 Term 07/28/99 [redacted]w[redacted]d  [redacted]w[redacted]d oz (2.835 kg) F Vag-Spont EPI N LIV    Obstetric Comments  Dr. FergusonNestor Lewandowsky  Physical Assessment:   Vitals:    02/22/22 1348 02/22/22 1403  BP: (!) 139/91 137/85  Pulse: 97 88  Weight: 219 lb (99.3 kg)   Height: _0  (1.575 m)   Body mass index is 40.06 kg/m.  Objective:  Blood pressure 137/85, pulse 88, height _1  (1.575 m), weight 219 lb (99.3 kg), not currently breastfeeding.  General:  alert, cooperative, and no distress   Breasts:  negative  Lungs: Normal respiratory effort  Heart:  regular rate and rhythm  Abdomen: soft, non-tender, incision well healed   Vulva:  normal  Vagina: normal vagina  Cervix:  normal  Corpus: Well involuted  Adnexa:  not evaluated  Rectal Exam: no hemorrhoids          No results found for this or any previous visit (from the past 24 hour(s)).  Assessment & Plan:  1) Postpartum exam 2) 5 wks s/p primary cesarean section, low transverse incision 3) bottle feeding 4) Depression screening  Essential components of care per ACOG recommendations:  1.  Mood and well being:  If positive depression screen, discussed and plan developed.  If using tobacco we discussed reduction/cessation and risk of relapse If current substance abuse, we discussed and referral to local resources was offered.   2. Infant care and feeding:  If breastfeeding, discussed returning to work, pumping, breastfeeding-associated pain, guidance regarding return to fertility while lactating if not using another method. If needed, patient was provided with a letter to be allowed to pump q 2-3hrs to support lactation in a private location with access to a refrigerator to store breastmilk.   Recommended that all caregivers be immunized for flu, pertussis and other preventable communicable diseases If pt does not have material needs met for her/baby, referred to local resources for help obtaining these.  3. Sexuality, contraception and birth spacing Provided guidance regarding sexuality, management of dyspareunia, and resumption of intercourse Discussed avoiding interpregnancy interval  <6mths an32mecommended birth spacing of 18 months  4. Sleep and fatigue Discussed coping options for fatigue and sleep disruption Encouraged family/partner/community support of 4 hrs of uninterrupted sleep to help with mood and fatigue  5. Physical recovery  If pt had a C/S, assessed incisional pain and providing guidance on normal vs prolonged recovery If pt had a laceration, perineal healing and pain reviewed.  If urinary or fecal incontinence, discussed management and referred to PT or uro/gyn if indicated  Patient is safe to resume physical activity. Give it a few more weeks before  heavy lifting; Discussed attainment of healthy weight.  6.  Chronic disease management Discussed pregnancy complications if any, and their implications for future childbearing and long-term maternal health. Review recommendations for prevention of recurrent pregnancy complications, such as aspirin to reduce risk of preeclampsia not applicable. Pt had GDM: No. If yes, 2hr GTT scheduled: not applicable. Reviewed medications and non-pregnant dosing including consideration of whether pt is breastfeeding using a reliable resource such as LactMed: not applicable Referred for f/u w/ PCP or subspecialist providers as indicated: not applicable  7. Health maintenance Mammogram 02/2021--needs one for this year Pap smears as indicated  Meds: No orders of the defined types were placed in this encounter.   Follow-up: No follow-ups on file.   No orders of the defined types were placed in this encounter.      [place] for [employer] [employer] 02/22/2022 2:33 PM

## 2022-02-26 LAB — CYTOLOGY - PAP
Comment: NEGATIVE
Diagnosis: NEGATIVE
High risk HPV: NEGATIVE

## 2022-03-07 ENCOUNTER — Encounter: Payer: Self-pay | Admitting: Neurology

## 2022-03-07 ENCOUNTER — Ambulatory Visit: Payer: Medicaid Other | Admitting: Neurology

## 2022-03-07 VITALS — BP 149/94 | HR 84 | Ht 63.0 in | Wt 224.0 lb

## 2022-03-07 DIAGNOSIS — R519 Headache, unspecified: Secondary | ICD-10-CM | POA: Diagnosis not present

## 2022-03-07 DIAGNOSIS — G8929 Other chronic pain: Secondary | ICD-10-CM | POA: Diagnosis not present

## 2022-03-07 DIAGNOSIS — H532 Diplopia: Secondary | ICD-10-CM | POA: Diagnosis not present

## 2022-03-07 DIAGNOSIS — H5713 Ocular pain, bilateral: Secondary | ICD-10-CM

## 2022-03-07 DIAGNOSIS — R2689 Other abnormalities of gait and mobility: Secondary | ICD-10-CM

## 2022-03-07 DIAGNOSIS — H539 Unspecified visual disturbance: Secondary | ICD-10-CM | POA: Diagnosis not present

## 2022-03-07 DIAGNOSIS — G932 Benign intracranial hypertension: Secondary | ICD-10-CM

## 2022-03-07 DIAGNOSIS — G441 Vascular headache, not elsewhere classified: Secondary | ICD-10-CM

## 2022-03-07 DIAGNOSIS — H4710 Unspecified papilledema: Secondary | ICD-10-CM

## 2022-03-07 DIAGNOSIS — R51 Headache with orthostatic component, not elsewhere classified: Secondary | ICD-10-CM | POA: Diagnosis not present

## 2022-03-07 NOTE — Progress Notes (Signed)
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     fund of knowledge Cranial Nerves:    The pupils are equal, round, and reactive to light. Mild papilledema Visual fields are full to finger confrontation. Extraocular movements are intact. Trigeminal sensation is intact and the muscles of mastication are normal. The face is symmetric. The palate elevates in the midline. Hearing intact. Voice is normal. Shoulder shrug is normal. The tongue has normal motion without fasciculations.   Coordination:     Normal   Gait:   normal.   Motor Observation:    No asymmetry, no atrophy, and no involuntary movements noted. Tone:    Normal muscle tone.    Posture:    Posture is normal. normal erect    Strength: poor effort, strength appears equal and non focal deficit      Sensation: intact to LT     Reflex Exam:  DTR's:    Deep tendon reflexes in the upper and lower extremities are normal bilaterally.   Toes:    The toes are downgoing bilaterally.   Clonus:    Clonus is absent.    Assessment/Plan:  Patient with headaches and optic nerve head edema  MRI Brain and orbits w/wo contrast: MRI brain due to concerning symptoms of morning headaches, positional headaches,vision changes, papilledema, diplopia  to look for space occupying mass, compressive mass, chiari or intracranial hypertension (pseudotumor) or other intracranial etiology.   MRI Orbits: due to papilledema,eye pain on movement, diplopia, vision changes, to look for compressive cavernous lesions, orbital pseudotumor   Lumbar puncture for opening pressure and labs.   Weight loss is critical, discussed weight loss and risk of permanent vision loss   If negative above then CTV for venous thrombosis and start Diamox 250 bid and increase to 500mg  bid at next appointment.   Labs today  [facility] [phone]  For any acute change especially worsening headache or vision loss call 911 and proceed to ED  Orders Placed This Encounter  Procedures   MR BRAIN W WO CONTRAST   MR ORBITS W WO CONTRAST   DG FLUORO GUIDED LOC OF NEEDLE/CATH TIP FOR SPINAL INJECT LT   CBC with Differential/Platelets   Comprehensive metabolic panel   TSH Rfx on Abnormal to Free T4     To prevent or relieve headaches, try the following: Cool Compress. Lie down and place a cool compress on your head.  Avoid headache triggers. If certain foods or odors seem to have triggered your migraines in the past, avoid them. A  headache diary might help you identify triggers.  Include physical activity in your daily routine. Try a daily walk or other moderate aerobic exercise.  Manage stress. Find healthy ways to cope with the stressors, such as delegating tasks on your to-do list.  Practice relaxation techniques. Try deep breathing, yoga, massage and visualization.  Eat regularly. Eating regularly scheduled meals and maintaining a healthy diet might help prevent headaches. Also, drink plenty of fluids.  Follow a regular sleep schedule. Sleep deprivation might contribute to headaches Consider biofeedback. With this mind-body technique, you learn to control certain bodily functions -- such as muscle tension, heart rate and blood pressure -- to prevent headaches or reduce headache pain.    Proceed to emergency room if you experience new or worsening symptoms or symptoms do  not resolve, if you have new neurologic symptoms or if headache is severe, or for any concerning symptom.   Provided education and documentation from American headache Society toolbox including articles on: pseudotumoer cerebri(IIH), chronic migraine medication overuse headache, chronic migraines, prevention of migraines and other headaches, behavioral and other nonpharmacologic treatments for headache.   Orders Placed This Encounter  Procedures   MR BRAIN W WO CONTRAST   MR ORBITS W WO CONTRAST   DG FLUORO GUIDED LOC OF NEEDLE/CATH TIP FOR SPINAL INJECT LT   CBC with Differential/Platelets   Comprehensive metabolic panel   TSH Rfx on Abnormal to Free T4   No orders of the defined types were placed in this encounter.   Cc: Marin Comment, [place], [place],  Ralph Leyden, [place], [facility] [address] [place], [place] [address]  Phone [phone] Fax [phone]

## 2022-03-07 NOTE — Patient Instructions (Addendum)
MRI of the brain and orbits Spinal tap (lumbar puncture) for pressure Then CT of the veins of the head May need to start diamox/acetazolamide after Spinal Tap [facility] - [facility] [phone]  Idiopathic Intracranial Hypertension  Idiopathic intracranial hypertension (IIH) is a condition that increases pressure around the brain. The fluid that surrounds the brain and spinal cord (cerebrospinal fluid, or CSF) increases and causes the pressure. Idiopathic means that the cause of this condition is not known. IIH affects the brain and spinal cord. If this condition is not treated, it can cause vision loss or blindness. What are the causes? The cause of this condition is not known. What increases the risk? The following factors may make you more likely to develop this condition: Being obese. Being a person who is female, between the ages of 56 and 64 years old, and who has not gone through menopause. Taking certain medicines, such as birth control, acne medicines, or steroids. What are the signs or symptoms? Symptoms of this condition include: Headaches. This is the most common symptom. Brief periods of total blindness. Double vision, blurred vision, or poor side (peripheral) vision. Pain in the shoulders or neck. Nausea and vomiting. A sound like rushing water or a pulsing sound within the ears (pulsatile tinnitus), or ringing in the ears. How is this diagnosed? This condition may be diagnosed based on: Your symptoms and medical history. Imaging tests of the brain, such as: CT scan. MRI. Magnetic resonance venogram (MRV) to check the veins. Diagnostic lumbar puncture. This is a procedure to remove and examine a sample of CSF. This procedure can determine whether your fluid pressure is too high. An eye exam to check for swelling or nerve damage in the eyes. How is this treated? Treatment for this condition depends on the symptoms. The goal of treatment is to  decrease the pressure around your brain. Common treatments include: Weight loss through healthy eating, salt restriction, and exercise, if you are overweight. Medicines to decrease the production of CSF and lower the pressure within your skull. Medicines to prevent or treat headaches. Other treatments may include: Surgery to place drains (shunts) in your brain to remove extra fluid. Lumbar puncture to remove extra CSF. Follow these instructions at home: If you are overweight or obese, work with your health care provider to lose weight. Take over-the-counter and prescription medicines only as told by your health care provider. Ask your health care provider if the medicine prescribed to you requires you to avoid driving or using machinery. Do not use any products that contain nicotine or tobacco. These products include cigarettes, chewing tobacco, and vaping devices, such as e-cigarettes. If you need help quitting, ask your health care provider. Keep all follow-up visits. Your health care provider will need to monitor you regularly. Contact a health care provider if: You have changes in your vision, such as: Double vision. Blurred vision. Poor peripheral vision. Get help right away if: You have any of the following symptoms and they get worse or do not get better: Headaches. Nausea. Vomiting. Sudden trouble seeing. This information is not intended to replace advice given to you by your health care provider. Make sure you discuss any questions you have with your health care provider. Document Revised: 08/22/2021 Document Reviewed: 08/01/2021 Elsevier Patient Education  [employer].  Acetazolamide Tablets What is this medication? ACETAZOLAMIDE (a set a ZOLE a mide) reduces swelling related to heart disease. It may also be used to reduce swelling caused  by medications. It helps your kidneys remove more fluid and salt from your blood through the urine. It may also be used to treat  conditions with increased pressure of the eye, such as glaucoma. It can be used with other medications to prevent and control seizures in people with epilepsy. It can also be used to prevent or treat symptoms of altitude sickness. It works by increasing the amount of oxygen in your body. It belongs to a group of medications called diuretics. This medicine may be used for other purposes; ask your health care provider or pharmacist if you have questions. COMMON BRAND NAME(S): Diamox What should I tell my care team before I take this medication? They need to know if you have any of these conditions: Glaucoma Kidney disease Liver disease Low adrenal gland function Lung or breathing disease An unusual or allergic reaction to acetazolamide, other medications, foods, dyes, or preservatives Pregnant or trying to get pregnant Breast-feeding How should I use this medication? Take this medication by mouth. Take it as directed on the prescription label at the same time every day. You can take it with or without food. If it upsets your stomach, take it with food. Keep taking it unless your care team tells you to stop. Talk to your care team about the use of this medication in children. Special care may be needed. Overdosage: If you think you have taken too much of this medicine contact a poison control center or emergency room at once. NOTE: This medicine is only for you. Do not share this medicine with others. What if I miss a dose? If you miss a dose, take it as soon as you can. If it is almost time for your next dose, take only that dose. Do not take double or extra doses. What may interact with this medication? Do not take this medication with any of the following: Methazolamide This medication may also interact with the following: Aspirin and aspirin-like medications Cyclosporine Lithium Medication for diabetes Methenamine Other diuretics Phenytoin Primidone Quinidine Sodium  bicarbonate Stimulant medications, such as dextroamphetamine This list may not describe all possible interactions. Give your health care provider a list of all the medicines, herbs, non-prescription drugs, or dietary supplements you use. Also tell them if you smoke, drink alcohol, or use illegal drugs. Some items may interact with your medicine. What should I watch for while using this medication? Visit your care team for regular checks on your progress. Tell your care team if your symptoms do not start to get better or if they get worse. This medication may cause serious skin reactions. They can happen weeks to months after starting the medication. Contact your care team right away if you notice fevers or flu-like symptoms with a rash. The rash may be red or purple and then turn into blisters or peeling of the skin. Or, you might notice a red rash with swelling of the face, lips, or lymph nodes in your neck or under your arms. This medication may affect your coordination, reaction time, or judgment. Do not drive or operate machinery until you know how this medication affects you. Sit up or stand slowly to reduce the risk of dizzy or fainting spells. What side effects may I notice from receiving this medication? Side effects that you should report to your care team as soon as possible: Allergic reactions--skin rash, itching, hives, swelling of the face, lips, tongue, or throat Aplastic anemia--unusual weakness or fatigue, dizziness, headache, trouble breathing, increased bleeding or bruising  High acid level--trouble breathing, unusual weakness or fatigue, confusion, headache, fast or irregular heartbeat, nausea, vomiting Infection--fever, chills, cough, or sore throat Kidney stones--blood in the urine, pain or trouble passing urine, pain in the lower back or sides Liver injury--right upper belly pain, loss of appetite, nausea, light-colored stool, dark yellow or brown urine, yellowing skin or eyes,  unusual weakness or fatigue Low potassium level--muscle pain or cramps, unusual weakness or fatigue, fast or irregular heartbeat, constipation Redness, blistering, peeling, or loosening of the skin, including inside the mouth Side effects that usually do not require medical attention (report to your care team if they continue or are bothersome): Blurry vision Change in taste Loss of appetite Pain, tingling, or numbness in the hands or feet This list may not describe all possible side effects. Call your doctor for medical advice about side effects. You may report side effects to FDA at 1-800-FDA-1088. Where should I keep my medication? Keep out of the reach of children and pets. Store at room temperature between 20 and 25 degrees C (68 and 77 degrees F). Throw away any unused medication after the expiration date. NOTE: This sheet is a summary. It may not cover all possible information. If you have questions about this medicine, talk to your doctor, pharmacist, or health care provider.  2023 Elsevier/Gold Standard (2021-05-05 00:00:00)

## 2022-03-08 LAB — COMPREHENSIVE METABOLIC PANEL
ALT: 23 IU/L (ref 0–32)
AST: 15 IU/L (ref 0–40)
Albumin/Globulin Ratio: 1.6 (ref 1.2–2.2)
Albumin: 4.7 g/dL (ref 3.9–4.9)
Alkaline Phosphatase: 54 IU/L (ref 44–121)
BUN/Creatinine Ratio: 31 — ABNORMAL HIGH (ref 9–23)
BUN: 20 mg/dL (ref 6–24)
Bilirubin Total: <0.2 mg/dL (ref 0.0–1.2)
CO2: 26 mmol/L (ref 20–29)
Calcium: 11.1 mg/dL — ABNORMAL HIGH (ref 8.7–10.2)
Chloride: 99 mmol/L (ref 96–106)
Creatinine, Ser: 0.64 mg/dL (ref 0.57–1.00)
Globulin, Total: 2.9 g/dL (ref 1.5–4.5)
Glucose: 88 mg/dL (ref 70–99)
Potassium: 4.7 mmol/L (ref 3.5–5.2)
Sodium: 140 mmol/L (ref 134–144)
Total Protein: 7.6 g/dL (ref 6.0–8.5)
eGFR: 113 mL/min/{1.73_m2} (ref >59)

## 2022-03-08 LAB — CBC WITH DIFFERENTIAL/PLATELET
Basophils Absolute: 0.1 10*3/uL (ref 0.0–0.2)
Basos: 1 %
EOS (ABSOLUTE): 0.1 10*3/uL (ref 0.0–0.4)
Eos: 2 %
Hematocrit: 40.8 % (ref 34.0–46.6)
Hemoglobin: 13.7 g/dL (ref 11.1–15.9)
Immature Grans (Abs): 0 10*3/uL (ref 0.0–0.1)
Immature Granulocytes: 0 %
Lymphocytes Absolute: 1.7 10*3/uL (ref 0.7–3.1)
Lymphs: 27 %
MCH: 29.2 pg (ref 26.6–33.0)
MCHC: 33.6 g/dL (ref 31.5–35.7)
MCV: 87 fL (ref 79–97)
Monocytes Absolute: 0.6 10*3/uL (ref 0.1–0.9)
Monocytes: 9 %
Neutrophils Absolute: 3.7 10*3/uL (ref 1.4–7.0)
Neutrophils: 61 %
Platelets: 381 10*3/uL (ref 150–450)
RBC: 4.69 x10E6/uL (ref 3.77–5.28)
RDW: 13.8 % (ref 11.7–15.4)
WBC: 6.2 10*3/uL (ref 3.4–10.8)

## 2022-03-08 LAB — TSH RFX ON ABNORMAL TO FREE T4: TSH: 3.36 u[IU]/mL (ref 0.450–4.500)

## 2022-03-09 ENCOUNTER — Telehealth: Payer: Self-pay | Admitting: Neurology

## 2022-03-09 NOTE — Telephone Encounter (Signed)
Pt checking on status of scheduling Spinal tap. Must completed by December 6. Would like a call back.

## 2022-03-12 ENCOUNTER — Encounter: Payer: Self-pay | Admitting: Neurology

## 2022-03-12 NOTE — Telephone Encounter (Signed)
Spinal tap has been ordered but not scheduled will send patient mychart message and give patient [facility] # to see if they are able to see patient this week

## 2022-03-21 ENCOUNTER — Telehealth: Payer: Self-pay | Admitting: Neurology

## 2022-03-21 NOTE — Telephone Encounter (Signed)
[id number]       Authorized  Approval Valid Through: 03/21/2022 - 05/19/2022 For GI

## 2022-03-26 ENCOUNTER — Other Ambulatory Visit: Payer: Self-pay | Admitting: Neurology

## 2022-03-26 ENCOUNTER — Ambulatory Visit
Admission: RE | Admit: 2022-03-26 | Discharge: 2022-03-26 | Disposition: A | Discharge status: Home / Self Care | Payer: Medicaid Other | Source: Ambulatory Visit | Attending: Neurology | Admitting: Neurology

## 2022-03-26 DIAGNOSIS — G932 Benign intracranial hypertension: Secondary | ICD-10-CM

## 2022-03-26 DIAGNOSIS — R2689 Other abnormalities of gait and mobility: Secondary | ICD-10-CM

## 2022-03-26 DIAGNOSIS — H4710 Unspecified papilledema: Secondary | ICD-10-CM

## 2022-03-26 DIAGNOSIS — G8929 Other chronic pain: Secondary | ICD-10-CM

## 2022-03-26 DIAGNOSIS — R51 Headache with orthostatic component, not elsewhere classified: Secondary | ICD-10-CM

## 2022-03-26 DIAGNOSIS — G441 Vascular headache, not elsewhere classified: Secondary | ICD-10-CM

## 2022-03-26 DIAGNOSIS — H5713 Ocular pain, bilateral: Secondary | ICD-10-CM

## 2022-03-26 DIAGNOSIS — H532 Diplopia: Secondary | ICD-10-CM

## 2022-03-26 DIAGNOSIS — H539 Unspecified visual disturbance: Secondary | ICD-10-CM

## 2022-03-26 MED ORDER — ALPRAZOLAM 0.25 MG PO TABS: ORAL_TABLET | ORAL | 0 refills | Status: DC

## 2022-03-28 ENCOUNTER — Other Ambulatory Visit: Payer: Medicaid Other

## 2022-03-29 ENCOUNTER — Ambulatory Visit: Payer: Medicaid Other | Admitting: Cardiology

## 2022-03-29 ENCOUNTER — Other Ambulatory Visit: Payer: Medicaid Other

## 2022-03-29 NOTE — Telephone Encounter (Signed)
MRI orders sent to [facility] per patient request for open MRI. [phone]

## 2022-03-30 ENCOUNTER — Ambulatory Visit: Payer: Medicaid Other | Attending: Cardiology | Admitting: Cardiology

## 2022-03-30 VITALS — BP 110/80 | HR 78 | Ht 63.0 in | Wt 221.8 lb

## 2022-03-30 DIAGNOSIS — R7303 Prediabetes: Secondary | ICD-10-CM

## 2022-03-30 DIAGNOSIS — R42 Dizziness and giddiness: Secondary | ICD-10-CM | POA: Diagnosis not present

## 2022-03-30 DIAGNOSIS — I493 Ventricular premature depolarization: Secondary | ICD-10-CM

## 2022-03-30 DIAGNOSIS — R55 Syncope and collapse: Secondary | ICD-10-CM

## 2022-03-30 NOTE — Progress Notes (Signed)
Cardiology Office Note:    Date:  03/31/2022   ID:  [name], DOB 01/29/1981, MRN [id number]  PCP:  Golden Pop, FNP  Cardiologist:  Thomasene Ripple, DO  Electrophysiologist:  None   Referring MD: Golden Pop, FNP   " I am doing well"  History of Present Illness:    [name] is a 41 y.o. female with a hx of gestational diabetes, chronic hypertension in pregnancy, symptomatic PVCs and on her ZIO monitor.   Since I saw the patient she has delivered.  Her baby  was delivered prematurely and staying in the NICU for some time.  She tells me that she has been told that she has some fluid around her brain.  She is working with neurology.  They are pending diagnostic testing and likely a procedure.  No chest pain or shortness of breath. She is here today with her mother.  I was very happy she shared the baby with me.  Past Medical History:  Diagnosis Date   Anemia    Anxiety "fast hearbeat"   took Benadryl at end of pregnancy   Back pain affecting pregnancy in first trimester 05/27/2015   Calculus of gallbladder without cholecystitis without obstruction 05/31/2019   Chlamydia 08/03/2021   06/21/21 @ [facility]  Only took 3 doxycycline (d/t n/v)   POC 08/03/21: neg   Complication of anesthesia    Epidural 1 sided   Depression    During 1 pregnancy   Diabetes mellitus without complication (HCC)    Dysrhythmia    Endometriosis    Gallstones    Gestational diabetes    just started checking BS 03/05/12   Headache(784.0)    Hematuria 05/27/2015   History of gestational diabetes in prior pregnancy, currently pregnant 06/21/2015   History of kidney stones    Irregular periods    Pregnancy induced hypertension    Recurrent upper respiratory infection (URI)    Mostly when pregnant   Spotting affecting pregnancy in first trimester 01/27/2015    Past Surgical History:  Procedure Laterality Date   APPENDECTOMY Right 09/03/2021   CESAREAN SECTION WITH BILATERAL TUBAL  LIGATION N/A 01/18/2022   Procedure: CESAREAN SECTION WITH BILATERAL TUBAL LIGATION;  Surgeon: Myna Hidalgo, DO;  Location: [facility];  Service: Obstetrics;  Laterality: N/A;   CHOLECYSTECTOMY N/A 03/20/2019   Procedure: LAPAROSCOPIC CHOLECYSTECTOMY;  Surgeon: Franky Macho, MD;  Location: [facility];  Service: General;  Laterality: N/A;   LAPAROSCOPIC APPENDECTOMY N/A 09/03/2021   Procedure: APPENDECTOMY LAPAROSCOPIC;  Surgeon: Axel Filler, MD;  Location[facility];  Service: General;  Laterality: N/A;   MULTIPLE TOOTH EXTRACTIONS     WISDOM TOOTH EXTRACTION      Current Medications: Current Meds  Medication Sig   acetaminophen (TYLENOL) 500 MG tablet Take 1 tablet (500 mg total) by mouth every 6 (six) hours as needed for mild pain.   ALPRAZolam (XANAX) 0.25 MG tablet Take 1-2 tabs (0.25mg -0.50mg ) 30-60 minutes before procedure. May repeat if needed.Do not drive.   amLODipine (NORVASC) 5 MG tablet Take 1 tablet (5 mg total) by mouth daily.   furosemide (LASIX) 20 MG tablet Take 1 tablet (20 mg total) by mouth daily for 2 days.   Iron, Ferrous Sulfate, 325 (65 Fe) MG TABS Take 1 tablet by mouth every other day.   loratadine (CLARITIN) 10 MG tablet Take 10 mg by mouth daily as needed for allergies.   meclizine (ANTIVERT) 25 MG tablet Take 25 mg by mouth 3 (three) times  daily as needed for dizziness.   Pediatric Multivitamins-Iron (FLINTSTONES COMPLETE PO) Take 2 tablets by mouth.   propranolol (INDERAL) 10 MG tablet Take 10 mg by mouth 2 (two) times daily.     Allergies:   Sulfamethoxazole-trimethoprim   Social History   Socioeconomic History   Marital status: Divorced    Spouse name: Not on file   Number of children: Not on file   Years of education: Not on file   Highest education level: Not on file  Occupational History   Not on file  Tobacco Use   Smoking status: Former    Packs/day: 0.10    Years: 0.50    Total pack years: 0.05    Types: Cigarettes    Start date:  02/19/1998    Quit date: 09/17/1998    Years since quitting: 23.5   Smokeless tobacco: Never  Vaping Use   Vaping Use: Never used  Substance and Sexual Activity   Alcohol use: Not Currently    Comment: not while pregnant   Drug use: No   Sexual activity: Not Currently    Partners: Male    Birth control/protection: None  Other Topics Concern   Not on file  Social History Narrative   Lives in [place], [place]   Does not work outside home.   Married for over 3 years.    Wear seatbelt.   Eats all food groups.   Enjoys dance.    Social Determinants of Health   Financial Resource Strain: Low Risk  (01/19/2020)   Overall Financial Resource Strain (CARDIA)    Difficulty of Paying Living Expenses: Not very hard  Food Insecurity: No Food Insecurity (08/03/2021)   Hunger Vital Sign    Worried About Running Out of Food in the Last Year: Never true    Ran Out of Food in the Last Year: Never true  Transportation Needs: No Transportation Needs (08/03/2021)   PRAPARE - Administrator, Civil Service (Medical): No    Lack of Transportation (Non-Medical): No  Physical Activity: Insufficiently Active (08/03/2021)   Exercise Vital Sign    Days of Exercise per Week: 1 day    Minutes of Exercise per Session: 10 min  Stress: No Stress Concern Present (08/03/2021)   [employer] of Occupational Health - Occupational Stress Questionnaire    Feeling of Stress : Only a little  Social Connections: Socially Isolated (08/03/2021)   Social Connection and Isolation Panel [NHANES]    Frequency of Communication with Friends and Family: More than three times a week    Frequency of Social Gatherings with Friends and Family: Three times a week    Attends Religious Services: Never    Active Member of Clubs or Organizations: No    Attends Banker Meetings: Never    Marital Status: Divorced     Family History: The patient's family history includes ADD / ADHD in her daughter,  daughter, daughter, and daughter; Anxiety disorder in her mother and sister; Asthma in her daughter; Breast cancer in her maternal grandmother; Cancer in her maternal grandmother and paternal grandmother; Depression in her mother; Heart attack in her mother; Heart disease in her maternal grandmother; Heart failure in her maternal grandmother; High Cholesterol in her father and mother; Hydrocephalus in her niece; Hypertension in her father and mother; Seizures in her daughter.  ROS:   Review of Systems  Constitution: Negative for decreased appetite, fever and weight gain.  HENT: Negative for congestion, ear discharge, hoarse voice and  sore throat.   Eyes: Negative for discharge, redness, vision loss in right eye and visual halos.  Cardiovascular: Negative for chest pain, dyspnea on exertion, leg swelling, orthopnea and palpitations.  Respiratory: Negative for cough, hemoptysis, shortness of breath and snoring.   Endocrine: Negative for heat intolerance and polyphagia.  Hematologic/Lymphatic: Negative for bleeding problem. Does not bruise/bleed easily.  Skin: Negative for flushing, nail changes, rash and suspicious lesions.  Musculoskeletal: Negative for arthritis, joint pain, muscle cramps, myalgias, neck pain and stiffness.  Gastrointestinal: Negative for abdominal pain, bowel incontinence, diarrhea and excessive appetite.  Genitourinary: Negative for decreased libido, genital sores and incomplete emptying.  Neurological: Negative for brief paralysis, focal weakness, headaches and loss of balance.  Psychiatric/Behavioral: Negative for altered mental status, depression and suicidal ideas.  Allergic/Immunologic: Negative for HIV exposure and persistent infections.    EKGs/Labs/Other Studies Reviewed:    The following studies were reviewed today:   EKG:  The ekg ordered today demonstrates   ZIO monitor 11/14/2021 Patch Wear Time:  7 days and 22 hours (2023-07-24T14:17:12-398 to  2023-08-01T12:53:34-0400) Patient had a min HR of 69 bpm, max HR of 129 bpm, and avg HR of 90 bpm. Predominant underlying rhythm was Sinus Rhythm. Isolated SVEs were rare (<1.0%), SVE Couplets were rare (<1.0%), and no SVE Triplets were present. Isolated VEs were frequent (8.2%,  84618), VE Couplets were rare (<1.0%, 8), and no VE Triplets were present. Ventricular Bigeminy and Trigeminy were present.  Symptoms associated with frequent PVCs. Conclusion: Symptomatic frequent PVCs.  TTE 09/22/2021 IMPRESSIONS     1. Left ventricular ejection fraction, by estimation, is 60 to 65%. The  left ventricle has normal function. The left ventricle has no regional  wall motion abnormalities. Left ventricular diastolic parameters were  normal.   2. Right ventricular systolic function is normal. The right ventricular  size is normal.   3. The mitral valve is normal in structure. Trivial mitral valve  regurgitation. No evidence of mitral stenosis.   4. The aortic valve is normal in structure. Aortic valve regurgitation is  not visualized. No aortic stenosis is present.   5. The inferior vena cava is normal in size with greater than 50%  respiratory variability, suggesting right atrial pressure of 3 mmHg.   FINDINGS   Left Ventricle: Left ventricular ejection fraction, by estimation, is 60  to 65%. The left ventricle has normal function. The left ventricle has no  regional wall motion abnormalities. The left ventricular internal cavity  size was normal in size. There is   no left ventricular hypertrophy. Left ventricular diastolic parameters  were normal.   Right Ventricle: The right ventricular size is normal. No increase in  right ventricular wall thickness. Right ventricular systolic function is  normal.   Left Atrium: Left atrial size was normal in size.   Right Atrium: Right atrial size was normal in size.   Pericardium: There is no evidence of pericardial effusion.   Mitral Valve: The  mitral valve is normal in structure. Trivial mitral  valve regurgitation. No evidence of mitral valve stenosis.   Tricuspid Valve: The tricuspid valve is normal in structure. Tricuspid  valve regurgitation is not demonstrated. No evidence of tricuspid  stenosis.   Aortic Valve: The aortic valve is normal in structure. Aortic valve  regurgitation is not visualized. No aortic stenosis is present.   Pulmonic Valve: The pulmonic valve was normal in structure. Pulmonic valve  regurgitation is not visualized. No evidence of pulmonic stenosis.   Aorta: The  aortic root is normal in size and structure.   Venous: The inferior vena cava is normal in size with greater than 50%  respiratory variability, suggesting right atrial pressure of 3 mmHg.   IAS/Shunts: No atrial level shunt detected by color flow Doppler.       Recent Labs: 09/25/2021: Magnesium 1.9 03/07/2022: ALT 23; BUN 20; Creatinine, Ser 0.64; Hemoglobin 13.7; Platelets 381; Potassium 4.7; Sodium 140; TSH 3.360  Recent Lipid Panel    Component Value Date/Time   CHOL 188 09/30/2020 1811   TRIG 181 (H) 09/30/2020 1811   HDL 53 09/30/2020 1811   CHOLHDL 3.5 09/30/2020 1811   VLDL 36 09/30/2020 1811   LDLCALC 99 09/30/2020 1811   LDLCALC 115 (H) 11/01/2017 1051    Physical Exam:    VS:  BP 110/80   Pulse 78   Ht 5\' 3"  (1.6 m)   Wt 221 lb 12.8 oz (100.6 kg)   SpO2 99%   BMI 39.29 kg/m     Wt Readings from Last 3 Encounters:  03/30/22 221 lb 12.8 oz (100.6 kg)  03/07/22 224 lb (101.6 kg)  02/22/22 219 lb (99.3 kg)     GEN: Well nourished, well developed in no acute distress HEENT: Normal NECK: No JVD; No carotid bruits LYMPHATICS: No lymphadenopathy CARDIAC: S1S2 noted,RRR, no murmurs, rubs, gallops RESPIRATORY:  Clear to auscultation without rales, wheezing or rhonchi  ABDOMEN: Soft, non-tender, non-distended, +bowel sounds, no guarding. EXTREMITIES: No edema, No cyanosis, no clubbing MUSCULOSKELETAL:  No  deformity  SKIN: Warm and dry NEUROLOGIC:  Alert and oriented x 3, non-focal PSYCHIATRIC:  Normal affect, good insight  ASSESSMENT:    1. Postural dizziness with presyncope   2. Pre-diabetes   3. Symptomatic PVCs      PLAN:    Doing well from a cardiovascular standpoint.  No changes will be made to her medicine at this time.  Her blood pressure is at target today.  The patient is in agreement with the above plan. The patient left the office in stable condition.  The patient will follow up in weeks or sooner if needed.  Medication Adjustments/Labs and Tests Ordered: Current medicines are reviewed at length with the patient today.  Concerns regarding medicines are outlined above.  No orders of the defined types were placed in this encounter.  No orders of the defined types were placed in this encounter.   Patient Instructions  Medication Instructions:  Your physician recommends that you continue on your current medications as directed. Please refer to the Current Medication list given to you today.  *If you need a refill on your cardiac medications before your next appointment, please call your pharmacy*   Lab Work: NONE If you have labs (blood work) drawn today and your tests are completely normal, you will receive your results only by: MyChart Message (if you have MyChart) OR A paper copy in the mail If you have any lab test that is abnormal or we need to change your treatment, we will call you to review the results.   Testing/Procedures: NONE   Follow-Up: At [facility], you and your health needs are our priority.  As part of our continuing mission to provide you with exceptional heart care, we have created designated Provider Care Teams.  These Care Teams include your primary Cardiologist (physician) and Advanced Practice Providers (APPs -  Physician Assistants and Nurse Practitioners) who all work together to provide you with the care you need, when you  need  it.  We recommend signing up for the patient portal called "MyChart".  Sign up information is provided on this After Visit Summary.  MyChart is used to connect with patients for Virtual Visits (Telemedicine).  Patients are able to view lab/test results, encounter notes, upcoming appointments, etc.  Non-urgent messages can be sent to your provider as well.   To learn more about what you can do with MyChart, go to [email or web address].    Your next appointment:   4 month(s)  The format for your next appointment:   In Person  Provider:   Thomasene RippleKardie Jhayla Podgorski, DO     Adopting a Healthy Lifestyle.  Know what a healthy weight is for you (roughly BMI <25) and aim to maintain this   Aim for 7+ servings of fruits and vegetables daily   65-80+ fluid ounces of water or unsweet tea for healthy kidneys   Limit to max 1 drink of alcohol per day; avoid smoking/tobacco   Limit animal fats in diet for cholesterol and heart health - choose grass fed whenever available   Avoid highly processed foods, and foods high in saturated/trans fats   Aim for low stress - take time to unwind and care for your mental health   Aim for 150 min of moderate intensity exercise weekly for heart health, and weights twice weekly for bone health   Aim for 7-9 hours of sleep daily   When it comes to diets, agreement about the perfect plan isnt easy to find, even among the experts. Experts at the [employer] developed an idea known as the Healthy Eating Plate. Just imagine a plate divided into logical, healthy portions.   The emphasis is on diet quality:   Load up on vegetables and fruits - one-half of your plate: Aim for color and variety, and remember that potatoes dont count.   Go for whole grains - one-quarter of your plate: Whole wheat, barley, wheat berries, quinoa, oats, brown rice, and foods made with them. If you want pasta, go with whole wheat pasta.   Protein power - one-quarter of  your plate: Fish, chicken, beans, and nuts are all healthy, versatile protein sources. Limit red meat.   The diet, however, does go beyond the plate, offering a few other suggestions.   Use healthy plant oils, such as olive, canola, soy, corn, sunflower and peanut. Check the labels, and avoid partially hydrogenated oil, which have unhealthy trans fats.   If youre thirsty, drink water. Coffee and tea are good in moderation, but skip sugary drinks and limit milk and dairy products to one or two daily servings.   The type of carbohydrate in the diet is more important than the amount. Some sources of carbohydrates, such as vegetables, fruits, whole grains, and beans-are healthier than others.   Finally, stay active  Signed, Thomasene RippleKardie Paxton Binns, DO  03/31/2022 11:54 AM    [facility]

## 2022-03-30 NOTE — Patient Instructions (Signed)
Medication Instructions:  Your physician recommends that you continue on your current medications as directed. Please refer to the Current Medication list given to you today.  *If you need a refill on your cardiac medications before your next appointment, please call your pharmacy*   Lab Work: NONE If you have labs (blood work) drawn today and your tests are completely normal, you will receive your results only by: MyChart Message (if you have MyChart) OR A paper copy in the mail If you have any lab test that is abnormal or we need to change your treatment, we will call you to review the results.   Testing/Procedures: NONE   Follow-Up: At [facility], you and your health needs are our priority.  As part of our continuing mission to provide you with exceptional heart care, we have created designated Provider Care Teams.  These Care Teams include your primary Cardiologist (physician) and Advanced Practice Providers (APPs -  Physician Assistants and Nurse Practitioners) who all work together to provide you with the care you need, when you need it.  We recommend signing up for the patient portal called "MyChart".  Sign up information is provided on this After Visit Summary.  MyChart is used to connect with patients for Virtual Visits (Telemedicine).  Patients are able to view lab/test results, encounter notes, upcoming appointments, etc.  Non-urgent messages can be sent to your provider as well.   To learn more about what you can do with MyChart, go to [email or web address].    Your next appointment:   4 month(s)  The format for your next appointment:   In Person  Provider:   Thomasene Ripple, DO

## 2022-04-16 ENCOUNTER — Other Ambulatory Visit: Payer: Self-pay | Admitting: Neurology

## 2022-04-16 MED ORDER — ALPRAZOLAM 0.25 MG PO TABS: ORAL_TABLET | ORAL | 0 refills | Status: DC

## 2022-04-17 DIAGNOSIS — R519 Headache, unspecified: Secondary | ICD-10-CM | POA: Diagnosis not present

## 2022-04-19 ENCOUNTER — Ambulatory Visit
Admission: RE | Admit: 2022-04-19 | Discharge: 2022-04-19 | Disposition: A | Discharge status: Home / Self Care | Payer: Medicaid Other | Source: Ambulatory Visit | Attending: Neurology | Admitting: Neurology

## 2022-04-19 ENCOUNTER — Telehealth: Payer: Self-pay | Admitting: *Deleted

## 2022-04-19 ENCOUNTER — Other Ambulatory Visit: Payer: Self-pay | Admitting: Neurology

## 2022-04-19 ENCOUNTER — Encounter: Payer: Self-pay | Admitting: Neurology

## 2022-04-19 DIAGNOSIS — R519 Headache, unspecified: Secondary | ICD-10-CM | POA: Diagnosis not present

## 2022-04-19 DIAGNOSIS — G441 Vascular headache, not elsewhere classified: Secondary | ICD-10-CM | POA: Diagnosis not present

## 2022-04-19 DIAGNOSIS — G932 Benign intracranial hypertension: Secondary | ICD-10-CM | POA: Diagnosis not present

## 2022-04-19 DIAGNOSIS — H4710 Unspecified papilledema: Secondary | ICD-10-CM | POA: Diagnosis not present

## 2022-04-19 DIAGNOSIS — G8929 Other chronic pain: Secondary | ICD-10-CM | POA: Diagnosis not present

## 2022-04-19 LAB — CSF CELL COUNT WITH DIFFERENTIAL
RBC Count, CSF: 0 cells/uL
TOTAL NUCLEATED CELL: 0 cells/uL (ref 0–5)

## 2022-04-19 LAB — PROTEIN, CSF: Total Protein, CSF: 33 mg/dL (ref 15–45)

## 2022-04-19 LAB — GLUCOSE, CSF: Glucose, CSF: 63 mg/dL (ref 40–80)

## 2022-04-19 NOTE — Telephone Encounter (Signed)
Received pt's brain MRI results from 04/17/2022. Results placed on Dr Cathren Laine desk for review.

## 2022-04-19 NOTE — Telephone Encounter (Signed)
MRI didn't show anything like a tumor or mass or stroke which is great but she does appear to have extra fluid in her brain (she had LP with elevated opening pressure); her lumbar puncture confirmed that she has high pressure around the brain likely causing her symptoms. I'm going to start her on diamox as we discussed at appointment, start with 250mg  twice daily. IPlease call and confirm this ok and I can preescribe the diamox,   and will see you at follow up appointment next month thanks.(FYI [name], you are seeing her, depending on how she is doing may want to increase diamox thanks)

## 2022-04-19 NOTE — Discharge Instructions (Signed)

## 2022-04-21 ENCOUNTER — Other Ambulatory Visit: Payer: Self-pay | Admitting: Neurology

## 2022-04-21 MED ORDER — TOPIRAMATE 50 MG PO TABS: 50.0000 mg | ORAL_TABLET | Freq: Two times a day (BID) | ORAL | 0 refills | Status: DC

## 2022-04-21 NOTE — Progress Notes (Unsigned)
Patient called requesting Diamox to be sent. She states Dr. Jaynee Eagles has not sent the medication yet. She has an allergy to Sulfa drug. I have called her and she reported hands swelling and skin peeling off while she was on Bactrim. I will start her on Topiramate 50 mg BID.   Dr. April Manson    Thank you Dr. April Manson, she is seeing Amy on 2/13 I will ask AMy to slowly titrate the topiramate for IDIOPATHIC INTRACRANIAL HYPERTENSION thank you. Dr. Jaynee Eagles

## 2022-04-23 ENCOUNTER — Telehealth: Payer: Self-pay | Admitting: *Deleted

## 2022-04-23 DIAGNOSIS — G971 Other reaction to spinal and lumbar puncture: Secondary | ICD-10-CM

## 2022-04-23 NOTE — Telephone Encounter (Signed)
Please order blood patch if she requests it thanks

## 2022-04-23 NOTE — Telephone Encounter (Signed)
I called pt and relayed her MRI results earlier see phone note. She is taking topiramate 50mg  po bid.

## 2022-04-23 NOTE — Telephone Encounter (Signed)
We just started the topiramate so we have to wait and give it some time to work. At appointment in February we can increase the topiramate. If she is having worsening vision she should f/u with her eye doctor and see if the swelling is getting worse or not. If the vision loss is worsening the only place to go is the emergency room. We have a follow up with her in February. Alternatively if you think this is spinal headache we can send hr back to GI for blood patch.

## 2022-04-23 NOTE — Telephone Encounter (Signed)
I called [name].  I relayed the results of the LP and recommendation for her.  She said she spoke to Dr. April Manson on Saturday and said that due to her allergies, topiramate 50mg  po bid would be recommended.  [name] states that she has headaches, frontal/in back with her vision black spots, blurry and sometimes states can't see.  She is hydrating, and did drink caffienated products Saturday and felt like it did hlep some, but she has cardiac condition that she cannot drink that much caffiene.  She stated that when she did lay down it did get some better (headache and vision).  I attempted to call [place] and LMVM for them about [name] needing a blood patch.  [phone], and [phone].   I called [phone] (closed in regardance of [place] holiday).

## 2022-04-23 NOTE — Addendum Note (Signed)
Addended by: Brandon Melnick on: 04/23/2022 03:31 PM   Modules accepted: Orders

## 2022-04-23 NOTE — Telephone Encounter (Signed)
-----  Message from Antonia B Ahern, MD sent at 04/19/2022  3:08 PM EST ----- [name], your lumbar puncture confirmed that you have high pressure around the brain likely causing your symptoms. I'm going to start you on diamox as we discussed at appointment, start with 250mg twice daily. I'll ask my nurse to call you to confirm you are ok with this and will see you at follow up appointment next month thanks.(FYI Amy, you are seeing her, depending on how she is doing may want to increase diamox thanks) 

## 2022-04-23 NOTE — Telephone Encounter (Signed)
-----  Message from Melvenia Beam, MD sent at 04/19/2022  3:08 PM EST ----- [name], your lumbar puncture confirmed that you have high pressure around the brain likely causing your symptoms. I'm going to start you on diamox as we discussed at appointment, start with 250mg  twice daily. I'll ask my nurse to call you to confirm you are ok with this and will see you at follow up appointment next month thanks.(FYI Amy, you are seeing her, depending on how she is doing may want to increase diamox thanks)

## 2022-04-23 NOTE — Telephone Encounter (Signed)
I called pt and relayed the note from Dr. Jaynee Eagles.  Pt feels that it is from the LP and needs the blood patch and that would help.  She had LP pressure from 28 to normal.  Will keep trying to hydrate.  Will see if her eye doctor is able to see her today (walk in).  She was not sure if opened.  I relayed if things get worse to seek care at ED for evaluation.  She verbalized understanding.

## 2022-04-23 NOTE — Addendum Note (Signed)
Addended by: Sarina Ill B on: 04/23/2022 03:45 PM   Modules accepted: Orders

## 2022-04-24 ENCOUNTER — Ambulatory Visit
Admission: RE | Admit: 2022-04-24 | Discharge: 2022-04-24 | Disposition: A | Discharge status: Home / Self Care | Payer: Medicaid Other | Source: Ambulatory Visit | Attending: Neurology | Admitting: Neurology

## 2022-04-24 DIAGNOSIS — G971 Other reaction to spinal and lumbar puncture: Secondary | ICD-10-CM

## 2022-04-24 MED ORDER — IOPAMIDOL (ISOVUE-M 200) INJECTION 41%
1.0000 mL | Freq: Once | INTRAMUSCULAR | Status: AC
  Administered 2022-04-24: 1 mL via EPIDURAL

## 2022-04-24 NOTE — Progress Notes (Signed)
20cc blood collected from pts L Hand prior to blood patch procedure. Pt tolerated well. Gauze and tape applied after.

## 2022-04-24 NOTE — Discharge Instructions (Signed)
Blood Patch Discharge Instructions  Go home and rest quietly for the next 24 hours.  It is important to lie flat for the next 24 hours.  Get up only to go to the restroom.  You may lie in the bed or on a couch on your back, your stomach, your left side or your right side.  You may have one pillow under your head.  You may have pillows between your knees while you are on your side or under your knees while you are on your back.  DO NOT drive today.  Recline the seat as far back as it will go, while still wearing your seat belt, on the way home.  You may get up to go to the bathroom as needed.  You may sit up for 10 minutes to eat.  You may resume your normal diet and medications unless otherwise indicated.  Drink lots of extra fluids today and tomorrow..   You may resume normal activities after your 24 hours of bed rest is over; however, do not exert yourself strongly or do any heavy lifting tomorrow.  Call your physician for a follow-up appointment.   If you have any questions  after you arrive home, please call [phone].  Discharge instructions have been explained to the patient.  The patient, or the person responsible for the patient, fully understands these instructions.    

## 2022-04-25 NOTE — Telephone Encounter (Signed)
Pt had blood patch yesterday, is still resting for 24 hours, be up at 1100.  She had her vision checked Monday and things better with the swelling.  Her headache she says if better.  She still has issues with vision. (She could not really describe).  I told her to let us know how she does later on when she is up and about.  She is very sore since last procedure.  I told her it could take sometime to get back to normal.  She will let us know.

## 2022-04-26 ENCOUNTER — Encounter: Payer: Self-pay | Admitting: Family Medicine

## 2022-04-26 ENCOUNTER — Ambulatory Visit: Payer: Medicaid Other | Admitting: Family Medicine

## 2022-04-26 VITALS — BP 131/84 | HR 85 | Ht 64.0 in | Wt 224.5 lb

## 2022-04-26 DIAGNOSIS — G932 Benign intracranial hypertension: Secondary | ICD-10-CM

## 2022-04-26 MED ORDER — TOPIRAMATE 50 MG PO TABS: 50.0000 mg | ORAL_TABLET | Freq: Two times a day (BID) | ORAL | 3 refills | Status: DC

## 2022-04-26 NOTE — Patient Instructions (Signed)
Below is our plan:  We will continue topiramate 50mg  twice daily. Please focus on weight management with your primary care provider. Please update eye exam within the next 6 months.   Please make sure you are staying well hydrated. I recommend 50-60 ounces daily. Well balanced diet and regular exercise encouraged. Consistent sleep schedule with 6-8 hours recommended.   Please continue follow up with care team as directed.   Follow up with me in 6 months   You may receive a survey regarding today's visit. I encourage you to leave honest feed back as I do use this information to improve patient care. Thank you for seeing me today!
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
## 2022-04-30 ENCOUNTER — Telehealth: Payer: Self-pay | Admitting: Neurology

## 2022-04-30 MED ORDER — ACETAZOLAMIDE 125 MG PO TABS: 125.0000 mg | ORAL_TABLET | Freq: Every morning | ORAL | 0 refills | Status: DC

## 2022-04-30 NOTE — Telephone Encounter (Signed)
She will need a sooner appointment because of changing meds please call and reschedule in the next 4 weeks with dr Jaynee Eagles or [name]. [name] will you get that taken care of?   Pod 4: Read below for details but Please call her tomorrow afternoon and see if she is doing ok after start of the acetazolamide(see below). She did state she is feeling more head pressure likely from stopping the topiramate. 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 Patient agreed.   She spoke to the pharmacist who says it is less likely she will have the same reaction to acetazolamide as she did with bactrim. So we will watch her closely and slowly increase diamox as she feel more comfortable and there is no reaction. If side effects as below, Stop medication and call us if any problems.    [place] called me several times over the weekend and spent a lot of time talking to her, she was stating that she felt as though her topiramate for IDIOPATHIC INTRACRANIAL HYPERTENSION was making her vision worse.  She cannot give me any details on what she meant by her worsening vision she just had blurry vision she denied any eye pain or halos, I explained to her that topiramate is a medication that can help with her IIH and the reason we not did not start her on Diamox is that she has a sulfa allergy.  Although the sulfa allergy did not sound as though it was anaphylaxis; patient said that she had some dry skin on her hands.  We discussed it several times.  She did end up going to her optometrist because I was concerned about a rare side effect of Topamax called acute angle glaucoma she did not not have that, she went to the optometrist she was seen her before, who confirmed that her papilledema was stable, slit-lamp exam was normal, no acute findings, she was diagnosed with dry eyes.  It could be that the topiramate is causing  the dry eyes bc she felt better after using Restasis.  I talked a very long time to [place] about her condition and that Topamax is supposed to help she is very hesitant to take Topamax again we discussed the risks and benefits about stopping medicine, vision loss, as well as changing to acetazolamide. Since her "allergy" did not really sound like an allergy as far as anaphylaxis goes so we could try starting her on acetazolamide for her IIH instead of Topamax.  She decided that she was stopping the Topamax, even though I encouraged her to continue to take it given the stable optometrist exam and no acute angle-closure glaucoma and risk for vision loss, she did not want to do that, I advised her of the risks of allergies because acetazolamide is a sulfa drug, we decided to try 1 pill diamox for several days and see if she does develop any side effects or vision loss, the minute she develops any kind of symptoms including rash shortness of breath any swelling in the face especially the tongue or the lips or vision worsening she was to stop and call 911, she agreed with me. I called her today and she has not tried the acetazolamide yet they had to order it, will have staff call her tomorrow, she is feeling ok on the restasis with relief, her headaches have worsened a little but, vision is  stable, will start the acetazolamide tomorrow.

## 2022-04-30 NOTE — Telephone Encounter (Signed)
Scheduled with Dr. Jaynee Eagles for 05/22/2022 at 11:30 am.  Pt would like to know if she still needs to complete MRI Orbits w/wo even though it's been figured out what's wrong with her. She's scheduled for 2:45 tomorrow if we could let her know before then if that is still necessary.

## 2022-04-30 NOTE — Telephone Encounter (Signed)
Dr Jaynee Eagles says yes, she still needs to get MRI orbits completed. I messaged patient and let her know.

## 2022-04-30 NOTE — Telephone Encounter (Signed)
Per Dr. Jaynee Eagles instruction, will try diamox 125mg  po am and see how she does.  She has stopped th topamax.  Is on Restasis now for dry eyes.   I relayed to watch for any side effects.   She verbalized understanding of plan.

## 2022-05-04 ENCOUNTER — Ambulatory Visit: Payer: Medicaid Other | Admitting: Cardiology

## 2022-05-16 ENCOUNTER — Telehealth: Payer: Self-pay | Admitting: Family Medicine

## 2022-05-16 MED ORDER — ACETAZOLAMIDE 250 MG PO TABS: 250.0000 mg | ORAL_TABLET | Freq: Two times a day (BID) | ORAL | 0 refills | Status: DC

## 2022-05-16 NOTE — Telephone Encounter (Signed)
I called patient to follow up with below message from phone room. I spoke with patient and she reports she can see out of her left eye, her vision is slightly blurry and feels like a thin film is covering her left eye. She states this has happened off/on for last 2 weeks. Patient said this episode started on Saturday pm and has continued all this week. She is taking Diamox 125 mg tablet twice daily as you directed on 04/30/22 telephone conversation. Patient said she did have this happen in right eye before as well, but no currently issues in right eye now. She is requesting to be see sooner (she is currently scheduled on 05/22/22) for follow up visit. She mentioned needing a refill on Diamox 125 mg tablet only has 1 pill left.  Do you want to see her sooner? Or okay to wait until 05/22/22?   Please advise

## 2022-05-16 NOTE — Telephone Encounter (Signed)
Increase diamox to 250mg  bid asap and then if no improvement we will go up to 500mg  twice a day. Send in a new prescription but she can just double her current pill for now.

## 2022-05-16 NOTE — Addendum Note (Signed)
Addended by: Thamas Jaegers on: 05/16/2022 11:53 AM   Modules accepted: Orders

## 2022-05-16 NOTE — Telephone Encounter (Signed)
Pt asking for a call from RN re: her request to be seen earlier as she has concerns for her left eye and she would like to know if Dr Jaynee Eagles wants her to increase her acetaZOLAMIDE (DIAMOX) 125 MG tablet

## 2022-05-16 NOTE — Telephone Encounter (Signed)
Patient informed with below, Rx sent. Patient will follow up next week.

## 2022-05-22 ENCOUNTER — Ambulatory Visit: Payer: Medicaid Other | Admitting: Family Medicine

## 2022-05-22 ENCOUNTER — Ambulatory Visit: Payer: Medicaid Other | Admitting: Neurology

## 2022-05-22 ENCOUNTER — Encounter: Payer: Self-pay | Admitting: Neurology

## 2022-05-22 VITALS — BP 113/78 | HR 79 | Ht 63.0 in | Wt 216.6 lb

## 2022-05-22 DIAGNOSIS — G441 Vascular headache, not elsewhere classified: Secondary | ICD-10-CM | POA: Diagnosis not present

## 2022-05-22 DIAGNOSIS — G08 Intracranial and intraspinal phlebitis and thrombophlebitis: Secondary | ICD-10-CM

## 2022-05-22 DIAGNOSIS — G932 Benign intracranial hypertension: Secondary | ICD-10-CM | POA: Diagnosis not present

## 2022-05-22 DIAGNOSIS — H4710 Unspecified papilledema: Secondary | ICD-10-CM

## 2022-05-22 MED ORDER — ACETAZOLAMIDE 125 MG PO TABS: 125.0000 mg | ORAL_TABLET | Freq: Two times a day (BID) | ORAL | 3 refills | Status: DC

## 2022-05-22 MED ORDER — RIZATRIPTAN BENZOATE 10 MG PO TBDP: 10.0000 mg | ORAL_TABLET | ORAL | 11 refills | Status: AC | PRN

## 2022-05-22 MED ORDER — NORTRIPTYLINE HCL 10 MG PO CAPS: 10.0000 mg | ORAL_CAPSULE | Freq: Every day | ORAL | 3 refills | Status: DC

## 2022-05-22 MED ORDER — ACETAZOLAMIDE 250 MG PO TABS: 250.0000 mg | ORAL_TABLET | Freq: Two times a day (BID) | ORAL | 6 refills | Status: DC

## 2022-05-22 NOTE — Patient Instructions (Addendum)
- Continue diamox 163m-250mg twice daily for IDIOPATHIC INTRACRANIAL HYPERTENSION  - Follow up mid march - Try Nortriptyline at bedtime to see if it helps the headache but stop for any side effects - When you have a headache try: Rizatriptan: Please take one tablet at the onset of your headache. If it does not improve the symptoms please take one additional tablet. Do not take more then 2 tablets in 24hrs. Do not take use more then 2 to 3 times in a week. - CTV  Rizatriptan Disintegrating Tablets What is this medication? RIZATRIPTAN (rye za TRIP tan) treats migraines. It works by blocking pain signals and narrowing blood vessels in the brain. It belongs to a group of medications called triptans. It is not used to prevent migraines. This medicine may be used for other purposes; ask your health care provider or pharmacist if you have questions. COMMON BRAND NAME(S): Maxalt-MLT What should I tell my care team before I take this medication? They need to know if you have any of these conditions: Circulation problems in fingers and toes Diabetes Heart disease High blood pressure High cholesterol History of irregular heartbeat History of stroke Stomach or intestine problems Tobacco use An unusual or allergic reaction to rizatriptan, other medications, foods, dyes, or preservatives Pregnant or trying to get pregnant Breast-feeding How should I use this medication? Take this medication by mouth. Take it as directed on the prescription label. You do not need water to take this medication. Leave the tablet in the sealed pack until you are ready to take it. With dry hands, open the pack and gently remove the tablet. If the tablet breaks or crumbles, throw it away. Use a new tablet. Place the tablet on the tongue and allow it to dissolve. Then, swallow it. Do not cut, crush, or chew this medication. Do not use it more often than directed. Talk to your care team about the use of this medication in  children. While it may be prescribed for children as young as 6 years for selected conditions, precautions do apply. Overdosage: If you think you have taken too much of this medicine contact a poison control center or emergency room at once. NOTE: This medicine is only for you. Do not share this medicine with others. What if I miss a dose? This does not apply. This medication is not for regular use. What may interact with this medication? Do not take this medication with any of the following: Ergot alkaloids, such as dihydroergotamine, ergotamine MAOIs, such as Marplan, Nardil, Parnate Other medications for migraine headache, such as almotriptan, eletriptan, frovatriptan, naratriptan, sumatriptan, zolmitriptan This medication may also interact with the following: Certain medications for depression, anxiety, or other mental health conditions Propranolol This list may not describe all possible interactions. Give your health care provider a list of all the medicines, herbs, non-prescription drugs, or dietary supplements you use. Also tell them if you smoke, drink alcohol, or use illegal drugs. Some items may interact with your medicine. What should I watch for while using this medication? Visit your care team for regular checks on your progress. Tell your care team if your symptoms do not start to get better or if they get worse. This medication may affect your coordination, reaction time, or judgment. Do not drive or operate machinery until you know how this medication affects you. Sit up or stand slowly to reduce the risk of dizzy or fainting spells. If you take migraine medications for 10 or more days a month, your  migraines may get worse. Keep a diary of headache days and medication use. Contact your care team if your migraine attacks occur more frequently. What side effects may I notice from receiving this medication? Side effects that you should report to your care team as soon as  possible: Allergic reactions--skin rash, itching, hives, swelling of the face, lips, tongue, or throat Burning, pain, tingling, or color changes in the hands, arms, legs, or feet Heart attack--pain or tightness in the chest, shoulders, arms, or jaw, nausea, shortness of breath, cold or clammy skin, feeling faint or lightheaded Heart rhythm changes--fast or irregular heartbeat, dizziness, feeling faint or lightheaded, chest pain, trouble breathing Increase in blood pressure Irritability, confusion, fast or irregular heartbeat, muscle stiffness, twitching muscles, sweating, high fever, seizure, chills, vomiting, diarrhea, which may be signs of serotonin syndrome Raynaud syndrome--cool, numb, or painful fingers or toes that may change color from pale, to blue, to red Seizures Stroke--sudden numbness or weakness of the face, arm, or leg, trouble speaking, confusion, trouble walking, loss of balance or coordination, dizziness, severe headache, change in vision Sudden or severe stomach pain, bloody diarrhea, fever, nausea, vomiting Vision loss Side effects that usually do not require medical attention (report to your care team if they continue or are bothersome): Dizziness Unusual weakness or fatigue This list may not describe all possible side effects. Call your doctor for medical advice about side effects. You may report side effects to FDA at 1-800-FDA-1088. Where should I keep my medication? Keep out of the reach of children and pets. Store at room temperature between 15 and 30 degrees C (59 and 86 degrees F). Protect from light and moisture. Get rid of any unused medication after the expiration date. To get rid of medications that are no longer needed or have expired: Take the medication to a medication take-back program. Check with your pharmacy or law enforcement to find a location. If you cannot return the medication, check the label or package insert to see if the medication should be thrown  out in the garbage or flushed down the toilet. If you are not sure, ask your care team. If it is safe to put it in the trash, empty the medication out of the container. Mix the medication with cat litter, dirt, coffee grounds, or other unwanted substance. Seal the mixture in a bag or container. Put it in the trash. NOTE: This sheet is a summary. It may not cover all possible information. If you have questions about this medicine, talk to your doctor, pharmacist, or health care provider.  2023 Elsevier/Gold Standard (2021-07-27 00:00:00) Nortriptyline Capsules What is this medication? NORTRIPTYLINE (nor TRIP ti leen) treats depression. It increases the amount of serotonin and norepinephrine in the brain, substances that help regulate mood. It belongs to a group of medications called tricyclic antidepressants (TCAs). This medicine may be used for other purposes; ask your health care provider or pharmacist if you have questions. COMMON BRAND NAME(S): Aventyl, Pamelor What should I tell my care team before I take this medication? They need to know if you have any of these conditions: Bipolar disorder Brugada syndrome Difficulty passing urine Glaucoma Heart disease If you drink alcohol Liver disease Schizophrenia Seizures Suicidal thoughts, plans or attempt; a previous suicide attempt by you or a family member Thyroid disease An unusual or allergic reaction to nortriptyline, other tricyclic antidepressants, other medications, foods, dyes, or preservatives Pregnant or trying to get pregnant Breast-feeding How should I use this medication? Take this medication by mouth  with a glass of water. Follow the directions on the prescription label. Take your doses at regular intervals. Do not take it more often than directed. Do not stop taking this medication suddenly except upon the advice of your care team. Stopping this medication too quickly may cause serious side effects or your condition may  worsen. A special MedGuide will be given to you by the pharmacist with each prescription and refill. Be sure to read this information carefully each time. Talk to your care team about the use of this medication in children. Special care may be needed. Overdosage: If you think you have taken too much of this medicine contact a poison control center or emergency room at once. NOTE: This medicine is only for you. Do not share this medicine with others. What if I miss a dose? If you miss a dose, take it as soon as you can. If it is almost time for your next dose, take only that dose. Do not take double or extra doses. What may interact with this medication? Do not take this medication with any of the following: Cisapride Dronedarone Linezolid MAOIs, such as Marplan, Nardil, and Parnate Methylene blue (injected into a vein) Pimozide Thioridazine This medication may also interact with the following: Alcohol Antihistamines for allergy, cough, and cold Atropine Buspirone Certain medications for bladder problems, such as oxybutynin or tolterodine Certain medications for depression, such as amitriptyline, fluoxetine, sertraline Certain medications for headaches, such as sumatriptan or rizatriptan Certain medications for Parkinson disease, such as benztropine or trihexyphenidyl Certain medications for stomach problems, such as dicyclomine or hyoscyamine Certain medications for travel sickness, such as scopolamine Chlorpropamide Cimetidine Fentanyl Ipratropium Lithium Other medications that cause heart rhythm changes, such as dofetilide Quinidine Reserpine St. John's wort Thyroid medication Tramadol Tryptophan This list may not describe all possible interactions. Give your health care provider a list of all the medicines, herbs, non-prescription drugs, or dietary supplements you use. Also tell them if you smoke, drink alcohol, or use illegal drugs. Some items may interact with your  medicine. What should I watch for while using this medication? Tell your care team if your symptoms do not get better or if they get worse. Visit your care team for regular checks on your progress. Because it may take several weeks to see the full effects of this medication, it is important to continue your treatment as prescribed by your care team. Patients and their families should watch out for new or worsening thoughts of suicide or depression. Also watch out for sudden changes in feelings such as feeling anxious, agitated, panicky, irritable, hostile, aggressive, impulsive, severely restless, overly excited and hyperactive, or not being able to sleep. If this happens, especially at the beginning of treatment or after a change in dose, call your care team. You may get drowsy or dizzy. Do not drive, use machinery, or do anything that needs mental alertness until you know how this medication affects you. Do not stand or sit up quickly, especially if you are an older patient. This reduces the risk of dizzy or fainting spells. Alcohol may interfere with the effect of this medication. Avoid alcoholic drinks. Do not treat yourself for coughs, colds, or allergies without asking your care team for advice. Some ingredients can increase possible side effects. Your mouth may get dry. Chewing sugarless gum or sucking hard candy, and drinking plenty of water may help. Contact your care team if the problem does not go away or is severe. This medication may cause  dry eyes and blurred vision. If you wear contact lenses you may feel some discomfort. Lubricating drops may help. See your eye doctor if the problem does not go away or is severe. This medication can cause constipation. Try to have a bowel movement at least every 2 to 3 days. If you do not have a bowel movement for 3 days, call your care team. This medication can make you more sensitive to the sun. Keep out of the sun. If you cannot avoid being in the sun,  wear protective clothing and use sunscreen. Do not use sun lamps or tanning beds/booths. What side effects may I notice from receiving this medication? Side effects that you should report to your care team as soon as possible: Allergic reactions--skin rash, itching, hives, swelling of the face, lips, tongue, or throat Heart rhythm changes--fast or irregular heartbeat, dizziness, feeling faint or lightheaded, chest pain, trouble breathing Irritability, confusion, fast or irregular heartbeat, muscle stiffness, twitching muscles, sweating, high fever, seizure, chills, vomiting, diarrhea, which may be signs of serotonin syndrome Seizures Sudden eye pain or change in vision such as blurry vision, seeing halos around lights, vision loss Thoughts of suicide or self-harm, worsening mood, feelings of depression Trouble passing urine Side effects that usually do not require medical attention (report to your care team if they continue or are bothersome): Change in sex drive or performance Constipation Dizziness Drowsiness Dry mouth Tremors or shaking This list may not describe all possible side effects. Call your doctor for medical advice about side effects. You may report side effects to FDA at 1-800-FDA-1088. Where should I keep my medication? Keep out of the reach of children. Store at room temperature between 15 and 30 degrees C (59 and 86 degrees F). Keep container tightly closed. Throw away any unused medication after the expiration date. NOTE: This sheet is a summary. It may not cover all possible information. If you have questions about this medicine, talk to your doctor, pharmacist, or health care provider.  2023 Elsevier/Gold Standard (2020-12-15 00:00:00)
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
## 2022-05-23 ENCOUNTER — Telehealth: Payer: Self-pay | Admitting: Psychiatry

## 2022-05-23 NOTE — Telephone Encounter (Signed)
Healthy blue Josem Kaufmann: [id number] exp. 05/23/22-07/21/22 sent to GI [phone]

## 2022-05-27 ENCOUNTER — Emergency Department (HOSPITAL_COMMUNITY)
Admission: EM | Admit: 2022-05-27 | Discharge: 2022-05-27 | Disposition: A | Discharge status: Home / Self Care | Payer: Medicaid Other | Attending: Emergency Medicine | Admitting: Emergency Medicine

## 2022-05-27 ENCOUNTER — Encounter (HOSPITAL_COMMUNITY): Payer: Self-pay

## 2022-05-27 ENCOUNTER — Telehealth: Payer: Self-pay | Admitting: Neurology

## 2022-05-27 DIAGNOSIS — R531 Weakness: Secondary | ICD-10-CM | POA: Insufficient documentation

## 2022-05-27 DIAGNOSIS — R29898 Other symptoms and signs involving the musculoskeletal system: Secondary | ICD-10-CM

## 2022-05-27 DIAGNOSIS — I10 Essential (primary) hypertension: Secondary | ICD-10-CM | POA: Diagnosis not present

## 2022-05-27 LAB — COMPREHENSIVE METABOLIC PANEL
ALT: 22 U/L (ref 0–44)
AST: 16 U/L (ref 15–41)
Albumin: 4 g/dL (ref 3.5–5.0)
Alkaline Phosphatase: 39 U/L (ref 38–126)
Anion gap: 11 (ref 5–15)
BUN: 13 mg/dL (ref 6–20)
CO2: 19 mmol/L — ABNORMAL LOW (ref 22–32)
Calcium: 10 mg/dL (ref 8.9–10.3)
Chloride: 107 mmol/L (ref 98–111)
Creatinine, Ser: 0.69 mg/dL (ref 0.44–1.00)
GFR, Estimated: >60 mL/min (ref >60)
Glucose, Bld: 102 mg/dL — ABNORMAL HIGH (ref 70–99)
Potassium: 3.6 mmol/L (ref 3.5–5.1)
Sodium: 137 mmol/L (ref 135–145)
Total Bilirubin: <0.1 mg/dL — ABNORMAL LOW (ref 0.3–1.2)
Total Protein: 7.4 g/dL (ref 6.5–8.1)

## 2022-05-27 LAB — CBC
HCT: 40.7 % (ref 36.0–46.0)
Hemoglobin: 13.6 g/dL (ref 12.0–15.0)
MCH: 28.6 pg (ref 26.0–34.0)
MCHC: 33.4 g/dL (ref 30.0–36.0)
MCV: 85.5 fL (ref 80.0–100.0)
Platelets: 278 10*3/uL (ref 150–400)
RBC: 4.76 MIL/uL (ref 3.87–5.11)
RDW: 14 % (ref 11.5–15.5)
WBC: 7.5 10*3/uL (ref 4.0–10.5)
nRBC: 0 % (ref 0.0–0.2)

## 2022-05-27 LAB — I-STAT CHEM 8, ED
BUN: 14 mg/dL (ref 6–20)
Calcium, Ion: 1.27 mmol/L (ref 1.15–1.40)
Chloride: 108 mmol/L (ref 98–111)
Creatinine, Ser: 0.6 mg/dL (ref 0.44–1.00)
Glucose, Bld: 94 mg/dL (ref 70–99)
HCT: 39 % (ref 36.0–46.0)
Hemoglobin: 13.3 g/dL (ref 12.0–15.0)
Potassium: 3.7 mmol/L (ref 3.5–5.1)
Sodium: 141 mmol/L (ref 135–145)
TCO2: 21 mmol/L — ABNORMAL LOW (ref 22–32)

## 2022-05-27 LAB — I-STAT BETA HCG BLOOD, ED (MC, WL, AP ONLY): I-stat hCG, quantitative: <5 m[IU]/mL (ref <5)

## 2022-05-27 MED ORDER — DIAZEPAM 5 MG/ML IJ SOLN: 5.0000 mg | Freq: Once | INTRAMUSCULAR | Status: DC

## 2022-05-27 MED ORDER — LACTATED RINGERS IV BOLUS: 1000.0000 mL | Freq: Once | INTRAVENOUS | Status: DC

## 2022-05-27 NOTE — Discharge Instructions (Signed)
Make sure you call your neurologist first thing in the morning to set up outpatient studies.  If you change your mind about undergoing MRI studies in the emergency department, please return.

## 2022-05-27 NOTE — ED Triage Notes (Signed)
Pt states since Thursday she has had issues walking due to weakness in her left leg. States she was recently diagnosed with idiopathic intracranial hypertension. Her neurologist had scheduled her to have a CT venogram but pt is concerned due to the symptoms not improving. Pt is AxOx4. Pt was ambulatory to triage with steady gate at this time.

## 2022-05-27 NOTE — ED Provider Notes (Signed)
[facility] Provider Note   CSN: [id number] Arrival date & time: 05/27/22  1612     History  Chief Complaint  Patient presents with   Extremity Weakness    [name] is a 42 y.o. female.   Extremity Weakness  Patient presents for left leg weakness.  Medical history includes anxiety, panic attacks, depression, HTN, prediabetes.  She had a recent diagnosis of idiopathic intracranial hypertension.  She is followed by [facility].  She last had an office visit 5 days ago.  She is on Diamox 125 mg twice daily.  She underwent MRI in January.  Over the past 3 days, she has had persistent left leg weakness.  This has caused her difficulty walking.  She denies any subjective numbness.  She denies any other areas of weakness.  She denies any vision changes.  She states that she had similar symptoms 2.5 weeks ago that resolved after a day.  Due to the persistence of her symptoms during this episode, she presents to the ED.     Home Medications Prior to Admission medications   Medication Sig Start Date End Date Taking? Authorizing Provider  acetaminophen (TYLENOL) 500 MG tablet Take 1 tablet (500 mg total) by mouth every 6 (six) hours as needed for mild pain. 01/21/22   Mercado-Ortiz, Ernestine Conrad, DO  acetaZOLAMIDE (DIAMOX) 250 MG tablet Take 1 tablet (250 mg total) by mouth 2 (two) times daily. 05/22/22   Melvenia Beam, MD  ALPRAZolam Duanne Moron) 0.25 MG tablet Take 1-2 tabs (0.25m-0.50mg) 30-60 minutes before procedure. May repeat if needed.Do not drive. 04/16/22   AMelvenia Beam MD  cycloSPORINE (RESTASIS) 0.05 % ophthalmic emulsion Place 1 drop into both eyes 2 (two) times daily.    [provider]  furosemide (LASIX) 20 MG tablet Take 1 tablet (20 mg total) by mouth daily for 2 days. 01/21/22 03/30/22  Mercado-Ortiz, JErnestine Conrad DO  Iron, Ferrous Sulfate, 325 (65 Fe) MG TABS Take 1 tablet by mouth every other day. 01/21/22  03/30/22  Mercado-Ortiz, JErnestine Conrad DO  loratadine (CLARITIN) 10 MG tablet Take 10 mg by mouth daily as needed for allergies.    [provider]  nortriptyline (PAMELOR) 10 MG capsule Take 1 capsule (10 mg total) by mouth at bedtime. 05/22/22   AMelvenia Beam MD  propranolol (INDERAL) 10 MG tablet Take 10 mg by mouth 2 (two) times daily.    [provider]  rizatriptan (MAXALT-MLT) 10 MG disintegrating tablet Take 1 tablet (10 mg total) by mouth as needed for migraine. May repeat in 2 hours if needed 05/22/22   AMelvenia Beam MD      Allergies    Sulfamethoxazole-trimethoprim    Review of Systems   Review of Systems  Musculoskeletal:  Positive for extremity weakness.  Neurological:  Positive for weakness.  All other systems reviewed and are negative.   Physical Exam Updated Vital Signs BP (!) 131/90 (BP Location: Right Arm)   Pulse 92   Temp 98.6 F (37 C) (Oral)   Resp 14   Ht 5' 3"$  (1.6 m)   Wt 97.5 kg   SpO2 99%   BMI 38.09 kg/m  Physical Exam Vitals and nursing note reviewed.  Constitutional:      General: She is not in acute distress.    Appearance: Normal appearance. She is well-developed. She is not ill-appearing, toxic-appearing or diaphoretic.  HENT:     Head: Normocephalic and atraumatic.  Right Ear: External ear normal.     Left Ear: External ear normal.     Nose: Nose normal.  Eyes:     Extraocular Movements: Extraocular movements intact.     Conjunctiva/sclera: Conjunctivae normal.  Cardiovascular:     Rate and Rhythm: Normal rate and regular rhythm.  Pulmonary:     Effort: Pulmonary effort is normal. No respiratory distress.  Abdominal:     General: There is no distension.     Palpations: Abdomen is soft.     Tenderness: There is no abdominal tenderness.  Musculoskeletal:        General: No swelling. Normal range of motion.     Cervical back: Normal range of motion and neck supple.     Right lower leg: No edema.     Left  lower leg: No edema.  Skin:    General: Skin is warm and dry.     Capillary Refill: Capillary refill takes less than 2 seconds.     Coloration: Skin is not jaundiced or pale.  Neurological:     Mental Status: She is alert.     Cranial Nerves: No cranial nerve deficit, dysarthria or facial asymmetry.     Sensory: Sensation is intact. No sensory deficit.     Motor: Weakness present. No pronator drift.     Coordination: Finger-Nose-Finger Test normal.     Comments: 4/5 strength on left lower extremity with knee extension, knee flexion.  Slightly diminished strength on left lower extremity with hip flexion.  Ankle dorsiflexion and plantarflexion are intact.  Psychiatric:        Mood and Affect: Mood normal.        Behavior: Behavior normal.     ED Results / Procedures / Treatments   Labs (all labs ordered are listed, but only abnormal results are displayed) Labs Reviewed  COMPREHENSIVE METABOLIC PANEL - Abnormal; Notable for the following components:      Result Value   CO2 19 (*)    Glucose, Bld 102 (*)    Total Bilirubin <0.1 (*)    All other components within normal limits  I-STAT CHEM 8, ED - Abnormal; Notable for the following components:   TCO2 21 (*)    All other components within normal limits  CBC  I-STAT BETA HCG BLOOD, ED ([facility], [facility], [facility] ONLY)    EKG None  Radiology No results found.  Procedures Procedures    Medications Ordered in ED Medications - No data to display  ED Course/ Medical Decision Making/ A&P                             Medical Decision Making Amount and/or Complexity of Data Reviewed Labs: ordered.   This patient presents to the ED for concern of left leg weakness, this involves an extensive number of treatment options, and is a complaint that carries with it a high risk of complications and morbidity.  The differential diagnosis includes CVA, compressive myelopathy, demyelinating disorder, neuropathy, myopathy   Co morbidities that  complicate the patient evaluation  anxiety, panic attacks, depression, HTN, prediabetes   Additional history obtained:  Additional history obtained from N/A External records from outside source obtained and reviewed including EMR   Lab Tests:  I Ordered, and personally interpreted labs.  The pertinent results include: Decreased bicarb which is likely secondary to Diamox.  Lab work is otherwise unremarkable.  Cardiac Monitoring: / EKG:  The patient was maintained  on a cardiac monitor.  I personally viewed and interpreted the cardiac monitored which showed an underlying rhythm of: Sinus rhythm  Problem List / ED Course / Critical interventions / Medication management  Patient is a 42 year old female presenting for left leg weakness for the past 3 days.  She states that she had similar symptoms 2-1/2 weeks ago that resolved in a day.  She is currently followed by neurology and is being treated for IIH.  Vital signs on arrival in the ED are normal.  Patient is well-appearing on exam.  She has no upper extremity weakness or diminished sensation.  On neurologic exam of lower extremities, she does appear to have decreased strength in LLE, particularly in knee flexion and extension.  She has no diminished sensation to left lower extremity.  I feel that this is unlikely related to her IIH.  I am concerned about a myelopathy.  I spoke with neurologist on-call, Dr. Lorrin Goodell, who agrees and does recommend MRIs of entire neuraxis.  Patient was hesitant about undergoing MRI studies due to her claustrophobia and anxiety.  She has undergone MRIs in the past, including in January.  She did agree to MRI studies and these were ordered in addition to Valium for anxiolysis.  While in the ED, awaiting MRI studies, patient stated that she was no longer willing to wait and feels that she may not be able to tolerate the studies anyway.  She has plans to call her neurologist in the morning to schedule an outpatient  MRI.  Patient was advised to return to the ED if she does change her mind.  She was discharged in stable condition.   Social Determinants of Health:  Has access to outpatient care   Test / Admission - Considered:  Encouraged her to stay for MRI studies, however, patient declined.         Final Clinical Impression(s) / ED Diagnoses Final diagnoses:  Left leg weakness    Rx / DC Orders ED Discharge Orders     None         Godfrey Pick, MD 05/27/22 2214

## 2022-05-27 NOTE — Telephone Encounter (Signed)
Left sided weakness , affected left arm and leg - affecting walking, balance and stability when she lifts. onset last Thursday, 3 days ago- feels she shouldn't carry her 46 months old infant. She is not nursing. Afraid to walk.   Left eye "blurred". Denies diplopia.  Was initially dx with idiopathic intracranial hypertension. MRI was done at [facility] on [address] , I can't review images.   She is scheduled for March 14 th with CTA . Should she rather get it now?  I recommended to go to ED. [facility] is nearest ED-but she has a driver and could go to [employer].  I encouraged her to go to ED.   Larey Seat, MD

## 2022-05-28 NOTE — Telephone Encounter (Signed)
Do we have any NP appointments this weeke she can be schedule for?

## 2022-05-28 NOTE — Telephone Encounter (Signed)
Pt scheduled to see Amy on 05/30/22

## 2022-05-28 NOTE — Telephone Encounter (Signed)
Pt said, went to the ED, wanted to do an MRI to see what was going on. Did not do MRI because would be a long wait, claustrophobic. Would like call from the nurse to discuss.

## 2022-05-28 NOTE — Telephone Encounter (Signed)
Patient was asked to present to the ED after she had developed weakness and gait problems on one body side. CD
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
## 2022-05-29 NOTE — Patient Instructions (Signed)
Below is our plan:  We will continue acetazolimide 125mg  twice daily. I will order an MRI for concerns of transient left leg weakness and numbness. Consider PT is you wish.   Please make sure you are staying well hydrated. I recommend 50-60 ounces daily. Well balanced diet and regular exercise encouraged. Consistent sleep schedule with 6-8 hours recommended.   Please continue follow up with care team as directed.   Follow up with Dr Lucia Gaskins in 06/2022  You may receive a survey regarding today's visit. I encourage you to leave honest feed back as I do use this information to improve patient care. Thank you for seeing me today!

## 2022-05-30 ENCOUNTER — Encounter: Payer: Self-pay | Admitting: Family Medicine

## 2022-05-30 ENCOUNTER — Ambulatory Visit: Payer: Medicaid Other | Admitting: Family Medicine

## 2022-05-30 VITALS — BP 147/108 | HR 86 | Ht 64.0 in | Wt 212.5 lb

## 2022-05-30 DIAGNOSIS — G932 Benign intracranial hypertension: Secondary | ICD-10-CM

## 2022-05-30 DIAGNOSIS — R202 Paresthesia of skin: Secondary | ICD-10-CM | POA: Diagnosis not present

## 2022-05-30 DIAGNOSIS — R29898 Other symptoms and signs involving the musculoskeletal system: Secondary | ICD-10-CM

## 2022-06-05 ENCOUNTER — Telehealth: Payer: Self-pay | Admitting: Family Medicine

## 2022-06-05 NOTE — Telephone Encounter (Signed)
[facility] [place]: [id number] exp. 06/05/22-08/03/22 sent to [facility] for open MRI [phone]

## 2022-06-07 DIAGNOSIS — R531 Weakness: Secondary | ICD-10-CM | POA: Diagnosis not present

## 2022-06-07 DIAGNOSIS — Z299 Encounter for prophylactic measures, unspecified: Secondary | ICD-10-CM | POA: Diagnosis not present

## 2022-06-07 DIAGNOSIS — F32A Depression, unspecified: Secondary | ICD-10-CM | POA: Diagnosis not present

## 2022-06-07 DIAGNOSIS — F419 Anxiety disorder, unspecified: Secondary | ICD-10-CM | POA: Diagnosis not present

## 2022-06-07 DIAGNOSIS — H6522 Chronic serous otitis media, left ear: Secondary | ICD-10-CM | POA: Diagnosis not present

## 2022-06-11 ENCOUNTER — Encounter: Payer: Self-pay | Admitting: Neurology

## 2022-06-14 DIAGNOSIS — R29898 Other symptoms and signs involving the musculoskeletal system: Secondary | ICD-10-CM | POA: Diagnosis not present

## 2022-06-14 DIAGNOSIS — G932 Benign intracranial hypertension: Secondary | ICD-10-CM | POA: Diagnosis not present

## 2022-06-18 ENCOUNTER — Encounter: Payer: Self-pay | Admitting: Family Medicine

## 2022-06-19 MED ORDER — ACETAZOLAMIDE 250 MG PO TABS: 250.0000 mg | ORAL_TABLET | Freq: Three times a day (TID) | ORAL | 3 refills | Status: DC

## 2022-06-21 ENCOUNTER — Ambulatory Visit
Admission: RE | Admit: 2022-06-21 | Discharge: 2022-06-21 | Disposition: A | Discharge status: Home / Self Care | Payer: Medicaid Other | Source: Ambulatory Visit | Attending: Neurology | Admitting: Neurology

## 2022-06-21 DIAGNOSIS — H4710 Unspecified papilledema: Secondary | ICD-10-CM | POA: Diagnosis not present

## 2022-06-21 DIAGNOSIS — D1802 Hemangioma of intracranial structures: Secondary | ICD-10-CM | POA: Diagnosis not present

## 2022-06-21 DIAGNOSIS — G08 Intracranial and intraspinal phlebitis and thrombophlebitis: Secondary | ICD-10-CM

## 2022-06-21 DIAGNOSIS — Q283 Other malformations of cerebral vessels: Secondary | ICD-10-CM | POA: Diagnosis not present

## 2022-06-21 DIAGNOSIS — G932 Benign intracranial hypertension: Secondary | ICD-10-CM

## 2022-06-21 DIAGNOSIS — G441 Vascular headache, not elsewhere classified: Secondary | ICD-10-CM

## 2022-06-21 MED ORDER — IOPAMIDOL (ISOVUE-370) INJECTION 76%
75.0000 mL | Freq: Once | INTRAVENOUS | Status: AC | PRN
  Administered 2022-06-21: 75 mL via INTRAVENOUS

## 2022-06-26 ENCOUNTER — Encounter: Payer: Self-pay | Admitting: Neurology

## 2022-06-26 ENCOUNTER — Ambulatory Visit: Payer: Medicaid Other | Admitting: Neurology

## 2022-06-26 VITALS — BP 129/82 | HR 74 | Ht 64.0 in | Wt 207.0 lb

## 2022-06-26 DIAGNOSIS — G932 Benign intracranial hypertension: Secondary | ICD-10-CM | POA: Diagnosis not present

## 2022-06-26 MED ORDER — ACETAZOLAMIDE 250 MG PO TABS: 500.0000 mg | ORAL_TABLET | Freq: Two times a day (BID) | ORAL | 11 refills | Status: DC
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
## 2022-06-26 NOTE — Patient Instructions (Addendum)
The medication helps the diamox 250mg  tid. No papilledema at [facility] 06/01/2022. She has cataracts which is impairing vision but otherwise the pressure and headaches are better.  She is having a hard time taking 250mg  3x a day and still a little pressure esp when laying down, so I changed to 500mg  bid if she cannot tolerate that dose she can take 250mg  in the morning and 500mg  at bedtime.  Meds ordered this encounter  Medications   acetaZOLAMIDE (DIAMOX) 250 MG tablet    Sig: Take 2 tablets (500 mg total) by mouth 2 (two) times daily.    Dispense:  120 tablet    Refill:  11   Idiopathic Intracranial Hypertension  Idiopathic intracranial hypertension (IIH) is a condition that increases pressure around the brain. The fluid that surrounds the brain and spinal cord (cerebrospinal fluid, or CSF) increases and causes the pressure. Idiopathic means that the cause of this condition is not known. IIH affects the brain and spinal cord. If this condition is not treated, it can cause vision loss or blindness. What are the causes? The cause of this condition is not known. What increases the risk? The following factors may make you more likely to develop this condition: Being obese. Being a person who is female, between the ages of 78 and 15 years old, and who has not gone through menopause. Taking certain medicines, such as birth control, acne medicines, or steroids. What are the signs or symptoms? Symptoms of this condition include: Headaches. This is the most common symptom. Brief periods of total blindness. Double vision, blurred vision, or poor side (peripheral) vision. Pain in the shoulders or neck. Nausea and vomiting. A sound like rushing water or a pulsing sound within the ears (pulsatile tinnitus), or ringing in the ears. How is this diagnosed? This condition may be diagnosed based on: Your symptoms and medical history. Imaging tests of the brain, such as: CT  scan. MRI. Magnetic resonance venogram (MRV) to check the veins. Diagnostic lumbar puncture. This is a procedure to remove and examine a sample of CSF. This procedure can determine whether your fluid pressure is too high. An eye exam to check for swelling or nerve damage in the eyes. How is this treated? Treatment for this condition depends on the symptoms. The goal of treatment is to decrease the pressure around your brain. Common treatments include: Weight loss through healthy eating, salt restriction, and exercise, if you are overweight. Medicines to decrease the production of CSF and lower the pressure within your skull. Medicines to prevent or treat headaches. Other treatments may include: Surgery to place drains (shunts) in your brain to remove extra fluid. Lumbar puncture to remove extra CSF. Follow these instructions at home: If you are overweight or obese, work with your health care provider to lose weight. Take over-the-counter and prescription medicines only as told by your health care provider. Ask your health care provider if the medicine prescribed to you requires you to avoid driving or using machinery. Do not use any products that contain nicotine or tobacco. These products include cigarettes, chewing tobacco, and vaping devices, such as e-cigarettes. If you need help quitting, ask your health care provider. Keep all follow-up visits. Your health care provider will need to monitor you regularly. Contact a health care provider if: You have changes in your vision, such as: Double vision. Blurred vision. Poor peripheral vision. Get help right away if: You have any of the following symptoms and they get worse or do  not get better: Headaches. Nausea. Vomiting. Sudden trouble seeing. This information is not intended to replace advice given to you by your health care provider. Make sure you discuss any questions you have with your health care provider. Document Revised:  08/22/2021 Document Reviewed: 08/01/2021 Elsevier Patient Education  [place].  Acetazolamide Tablets What is this medication? ACETAZOLAMIDE (a set a ZOLE a mide) reduces swelling related to heart disease. It may also be used to reduce swelling caused by medications. It helps your kidneys remove more fluid and salt from your blood through the urine. It may also be used to treat conditions with increased pressure of the eye, such as glaucoma. It can be used with other medications to prevent and control seizures in people with epilepsy. It can also be used to prevent or treat symptoms of altitude sickness. It works by increasing the amount of oxygen in your body. It belongs to a group of medications called diuretics. This medicine may be used for other purposes; ask your health care provider or pharmacist if you have questions. COMMON BRAND NAME(S): Diamox What should I tell my care team before I take this medication? They need to know if you have any of these conditions: Glaucoma Kidney disease Liver disease Low adrenal gland function Lung or breathing disease An unusual or allergic reaction to acetazolamide, other medications, foods, dyes, or preservatives Pregnant or trying to get pregnant Breast-feeding How should I use this medication? Take this medication by mouth. Take it as directed on the prescription label at the same time every day. You can take it with or without food. If it upsets your stomach, take it with food. Keep taking it unless your care team tells you to stop. Talk to your care team about the use of this medication in children. Special care may be needed. Overdosage: If you think you have taken too much of this medicine contact a poison control center or emergency room at once. NOTE: This medicine is only for you. Do not share this medicine with others. What if I miss a dose? If you miss a dose, take it as soon as you can. If it is almost time for your next dose,  take only that dose. Do not take double or extra doses. What may interact with this medication? Do not take this medication with any of the following: Methazolamide This medication may also interact with the following: Aspirin and aspirin-like medications Cyclosporine Lithium Medication for diabetes Methenamine Other diuretics Phenytoin Primidone Quinidine Sodium bicarbonate Stimulant medications, such as dextroamphetamine This list may not describe all possible interactions. Give your health care provider a list of all the medicines, herbs, non-prescription drugs, or dietary supplements you use. Also tell them if you smoke, drink alcohol, or use illegal drugs. Some items may interact with your medicine. What should I watch for while using this medication? Visit your care team for regular checks on your progress. Tell your care team if your symptoms do not start to get better or if they get worse. This medication may cause serious skin reactions. They can happen weeks to months after starting the medication. Contact your care team right away if you notice fevers or flu-like symptoms with a rash. The rash may be red or purple and then turn into blisters or peeling of the skin. Or, you might notice a red rash with swelling of the face, lips, or lymph nodes in your neck or under your arms. This medication may affect your coordination, reaction time,  or judgment. Do not drive or operate machinery until you know how this medication affects you. Sit up or stand slowly to reduce the risk of dizzy or fainting spells. What side effects may I notice from receiving this medication? Side effects that you should report to your care team as soon as possible: Allergic reactions--skin rash, itching, hives, swelling of the face, lips, tongue, or throat Aplastic anemia--unusual weakness or fatigue, dizziness, headache, trouble breathing, increased bleeding or bruising High acid level--trouble breathing,  unusual weakness or fatigue, confusion, headache, fast or irregular heartbeat, nausea, vomiting Infection--fever, chills, cough, or sore throat Kidney stones--blood in the urine, pain or trouble passing urine, pain in the lower back or sides Liver injury--right upper belly pain, loss of appetite, nausea, light-colored stool, dark yellow or brown urine, yellowing skin or eyes, unusual weakness or fatigue Low potassium level--muscle pain or cramps, unusual weakness or fatigue, fast or irregular heartbeat, constipation Redness, blistering, peeling, or loosening of the skin, including inside the mouth Side effects that usually do not require medical attention (report to your care team if they continue or are bothersome): Blurry vision Change in taste Loss of appetite Pain, tingling, or numbness in the hands or feet This list may not describe all possible side effects. Call your doctor for medical advice about side effects. You may report side effects to FDA at 1-800-FDA-1088. Where should I keep my medication? Keep out of the reach of children and pets. Store at room temperature between 20 and 25 degrees C (68 and 77 degrees F). Throw away any unused medication after the expiration date. NOTE: This sheet is a summary. It may not cover all possible information. If you have questions about this medicine, talk to your doctor, pharmacist, or health care provider.  2023 Elsevier/Gold Standard (2021-02-07 00:00:00)

## 2022-07-03 ENCOUNTER — Telehealth: Payer: Self-pay | Admitting: Cardiology

## 2022-07-03 NOTE — Telephone Encounter (Signed)
Pt c/o Shortness Of Breath: STAT if SOB developed within the last 24 hours or pt is noticeably SOB on the phone  1. Are you currently SOB (can you hear that pt is SOB on the phone)? Yes  2. How long have you been experiencing SOB? Going on 2 weeks  3. Are you SOB when sitting or when up moving around? Moving around  4. Are you currently experiencing any other symptoms? Some chest pains off and on- heart beat rel fat at times

## 2022-07-03 NOTE — Telephone Encounter (Signed)
Called pt to follow up on shortness of breath for 2 weeks and fast heart rate.  No answer, left message and call back number

## 2022-07-05 ENCOUNTER — Other Ambulatory Visit: Payer: Medicaid Other | Admitting: Obstetrics and Gynecology

## 2022-07-05 NOTE — Patient Instructions (Signed)
Hi Ms. Ruiter, I am sorry we missed you today  - as a part of your Medicaid benefit, you are eligible for care management and care coordination services at no cost or copay. I was unable to reach you by phone today but would be happy to help you with your health related needs. Please feel free to call me at [phone].  A member of the Managed Medicaid care management team will reach out to you again over the next 30 business  days.   Aida Raider RN, BSN [place]  [facility] - Managed Medicaid High Risk [phone]

## 2022-07-05 NOTE — Patient Outreach (Signed)
  Medicaid Managed Care   Unsuccessful Attempt Note   07/05/2022 Name: [name] MRN: [id number] DOB: 02-17-1981  Referred by: Ralph Leyden, FNP Reason for referral : High Risk Managed Medicaid (Unsuccessful telephone outreach)  An unsuccessful telephone outreach was attempted today. The patient was referred to the case management team for assistance with care management and care coordination.    Follow Up Plan: The Managed Medicaid care management team will reach out to the patient again over the next 30 business  days. and The  Patient has been provided with contact information for the Managed Medicaid care management team and has been advised to call with any health related questions or concerns.   Aida Raider RN, BSN [place]  [facility] - Managed Medicaid High Risk [phone]

## 2022-07-06 NOTE — Telephone Encounter (Signed)
RN called patient .   Response given to patient . Patient voiced understanding.

## 2022-07-06 NOTE — Telephone Encounter (Signed)
Patient is concerned with increase of SOB.  She thinks the new medication Diamox might be affecting her.  She was supposed to start increased dose per neurology "She is having a hard time taking 250mg  3x a day and still a little pressure esp when laying down, so I changed to 500mg  bid if she cannot tolerate that dose she can take 250mg  in the morning and 500mg  at bedtime" She states she started 250mg  dosage around the end of February.  She is only taking it One 250mg  tablet in the morning and one 250mg  tablet  in the evening  . She has an appt April 15th but was not sure what she should do in the meantime.

## 2022-07-06 NOTE — Telephone Encounter (Signed)
  It will be best she speak with the prescribing provider for other options.   Tobb, Kardie, DO

## 2022-07-10 ENCOUNTER — Telehealth: Payer: Self-pay

## 2022-07-10 NOTE — Telephone Encounter (Signed)
..  Patient declines further follow up and engagement by the Managed Medicaid Team. Appropriate care team members and provider have been notified via electronic communication. The Managed Medicaid Team is available to follow up with the patient after provider conversation with the patient regarding recommendation for engagement and subsequent re-referral to the Managed Medicaid Team.     [name] Care Guide  Medicaid Managed  [place]  [phone]  

## 2022-07-23 ENCOUNTER — Encounter: Payer: Self-pay | Admitting: Cardiology

## 2022-07-23 ENCOUNTER — Ambulatory Visit: Payer: Medicaid Other | Attending: Cardiology | Admitting: Cardiology

## 2022-07-23 VITALS — BP 128/84 | HR 73 | Ht 64.0 in | Wt 198.4 lb

## 2022-07-23 DIAGNOSIS — I493 Ventricular premature depolarization: Secondary | ICD-10-CM

## 2022-07-23 DIAGNOSIS — Z79899 Other long term (current) drug therapy: Secondary | ICD-10-CM

## 2022-07-23 DIAGNOSIS — R0789 Other chest pain: Secondary | ICD-10-CM | POA: Diagnosis not present

## 2022-07-23 DIAGNOSIS — E669 Obesity, unspecified: Secondary | ICD-10-CM | POA: Diagnosis not present

## 2022-07-23 MED ORDER — PANTOPRAZOLE SODIUM 20 MG PO TBEC: 20.0000 mg | DELAYED_RELEASE_TABLET | Freq: Every day | ORAL | 3 refills | Status: AC

## 2022-07-23 NOTE — Patient Instructions (Addendum)
Medication Instructions:  Your physician has recommended you make the following change in your medication:  START: Pantoprazole 20 mg once daily *If you need a refill on your cardiac medications before your next appointment, please call your pharmacy*   Lab Work: Your physician recommends that you have labs drawn today: BMET, Mag If you have labs (blood work) drawn today and your tests are completely normal, you will receive your results only by: MyChart Message (if you have MyChart) OR A paper copy in the mail If you have any lab test that is abnormal or we need to change your treatment, we will call you to review the results.   Testing/Procedures: None   Follow-Up: At [facility], you and your health needs are our priority.  As part of our continuing mission to provide you with exceptional heart care, we have created designated Provider Care Teams.  These Care Teams include your primary Cardiologist (physician) and Advanced Practice Providers (APPs -  Physician Assistants and Nurse Practitioners) who all work together to provide you with the care you need, when you need it.   Your next appointment:   4 month(s)  Provider:   Thomasene Ripple, DO

## 2022-07-24 LAB — BASIC METABOLIC PANEL
BUN/Creatinine Ratio: 18 (ref 9–23)
BUN: 12 mg/dL (ref 6–24)
CO2: 20 mmol/L (ref 20–29)
Calcium: 9.7 mg/dL (ref 8.7–10.2)
Chloride: 106 mmol/L (ref 96–106)
Creatinine, Ser: 0.68 mg/dL (ref 0.57–1.00)
Glucose: 85 mg/dL (ref 70–99)
Potassium: 4.2 mmol/L (ref 3.5–5.2)
Sodium: 139 mmol/L (ref 134–144)
eGFR: 111 mL/min/{1.73_m2} (ref >59)

## 2022-07-24 LAB — MAGNESIUM: Magnesium: 1.9 mg/dL (ref 1.6–2.3)

## 2022-07-24 NOTE — Progress Notes (Signed)
Cardiology Office Note:    Date:  07/24/2022   ID:  [name], DOB 27-Dec-1980, MRN [id number]  PCP:  Golden Pop, FNP  Cardiologist:  Thomasene Ripple, DO  Electrophysiologist:  None   Referring MD: Golden Pop, FNP   " I am doing well"  History of Present Illness:    [name] is a 42 y.o. female with a hx of gestational diabetes, chronic hypertension in pregnancy, symptomatic PVCs and on her ZIO monitor.   At her last visit she was doing well from a cardiovascular standpoint.  Her blood pressure was at target.  Saw the patient she has been started on Diamox to 50 twice a day.  But she tells me that she is only taking this once a day.  She will discuss with the prescribing physician.  She complains about chest discomfort burning sensation.  Mostly with food and at nighttime.  No other complaints at this time.  Past Medical History:  Diagnosis Date   Anemia    Anxiety "fast hearbeat"   took Benadryl at end of pregnancy   Back pain affecting pregnancy in first trimester 05/27/2015   Calculus of gallbladder without cholecystitis without obstruction 05/31/2019   Chlamydia 08/03/2021   06/21/21 @ [facility]  Only took 3 doxycycline (d/t n/v)   POC 08/03/21: neg   Complication of anesthesia    Epidural 1 sided   Depression    During 1 pregnancy   Diabetes mellitus without complication    Dysrhythmia    Endometriosis    Gallstones    Gestational diabetes    just started checking BS 03/05/12   Headache(784.0)    Hematuria 05/27/2015   History of gestational diabetes in prior pregnancy, currently pregnant 06/21/2015   History of kidney stones    Irregular periods    Pregnancy induced hypertension    Recurrent upper respiratory infection (URI)    Mostly when pregnant   Spotting affecting pregnancy in first trimester 01/27/2015    Past Surgical History:  Procedure Laterality Date   APPENDECTOMY Right 09/03/2021   CESAREAN SECTION WITH BILATERAL TUBAL LIGATION  N/A 01/18/2022   Procedure: CESAREAN SECTION WITH BILATERAL TUBAL LIGATION;  Surgeon: Myna Hidalgo, DO;  Location: [facility];  Service: Obstetrics;  Laterality: N/A;   CHOLECYSTECTOMY N/A 03/20/2019   Procedure: LAPAROSCOPIC CHOLECYSTECTOMY;  Surgeon: Franky Macho, MD;  Location: [facility];  Service: General;  Laterality: N/A;   LAPAROSCOPIC APPENDECTOMY N/A 09/03/2021   Procedure: APPENDECTOMY LAPAROSCOPIC;  Surgeon: Axel Filler, MD;  Location: [facility];  Service: General;  Laterality: N/A;   MULTIPLE TOOTH EXTRACTIONS     WISDOM TOOTH EXTRACTION      Current Medications: Current Meds  Medication Sig   acetaminophen (TYLENOL) 500 MG tablet Take 1 tablet (500 mg total) by mouth every 6 (six) hours as needed for mild pain.   acetaZOLAMIDE (DIAMOX) 250 MG tablet Take 2 tablets (500 mg total) by mouth 2 (two) times daily.   ALPRAZolam (XANAX) 0.25 MG tablet Take 0.25 mg by mouth at bedtime as needed for anxiety.   cycloSPORINE (RESTASIS) 0.05 % ophthalmic emulsion Place 1 drop into both eyes 2 (two) times daily.   FLUoxetine (PROZAC) 40 MG capsule Take 40 mg by mouth daily.   loratadine (CLARITIN) 10 MG tablet Take 10 mg by mouth daily as needed for allergies.   nortriptyline (PAMELOR) 10 MG capsule Take 1 capsule (10 mg total) by mouth at bedtime.   pantoprazole (PROTONIX) 20 MG tablet Take 1  tablet (20 mg total) by mouth daily.   propranolol (INDERAL) 10 MG tablet Take 10 mg by mouth daily.   rizatriptan (MAXALT-MLT) 10 MG disintegrating tablet Take 1 tablet (10 mg total) by mouth as needed for migraine. May repeat in 2 hours if needed     Allergies:   Sulfamethoxazole-trimethoprim   Social History   Socioeconomic History   Marital status: Divorced    Spouse name: Not on file   Number of children: Not on file   Years of education: Not on file   Highest education level: Not on file  Occupational History   Not on file  Tobacco Use   Smoking status: Former    Packs/day: 0.10     Years: 0.50    Additional pack years: 0.00    Total pack years: 0.05    Types: Cigarettes    Start date: 02/19/1998    Quit date: 09/17/1998    Years since quitting: 23.8   Smokeless tobacco: Never  Vaping Use   Vaping Use: Never used  Substance and Sexual Activity   Alcohol use: Not Currently    Comment: not while pregnant   Drug use: No   Sexual activity: Not Currently    Partners: Male    Birth control/protection: None  Other Topics Concern   Not on file  Social History Narrative   Lives in [place], [place]   Does not work outside home.   Married for over 3 years.    Wear seatbelt.   Eats all food groups.   Enjoys dance.    Social Determinants of Health   Financial Resource Strain: Low Risk  (01/19/2020)   Overall Financial Resource Strain (CARDIA)    Difficulty of Paying Living Expenses: Not very hard  Food Insecurity: No Food Insecurity (08/03/2021)   Hunger Vital Sign    Worried About Running Out of Food in the Last Year: Never true    Ran Out of Food in the Last Year: Never true  Transportation Needs: No Transportation Needs (08/03/2021)   PRAPARE - Administrator, Civil Service (Medical): No    Lack of Transportation (Non-Medical): No  Physical Activity: Insufficiently Active (08/03/2021)   Exercise Vital Sign    Days of Exercise per Week: 1 day    Minutes of Exercise per Session: 10 min  Stress: No Stress Concern Present (08/03/2021)   [employer] of Occupational Health - Occupational Stress Questionnaire    Feeling of Stress : Only a little  Social Connections: Socially Isolated (08/03/2021)   Social Connection and Isolation Panel [NHANES]    Frequency of Communication with Friends and Family: More than three times a week    Frequency of Social Gatherings with Friends and Family: Three times a week    Attends Religious Services: Never    Active Member of Clubs or Organizations: No    Attends Banker Meetings: Never    Marital  Status: Divorced     Family History: The patient's family history includes ADD / ADHD in her daughter, daughter, daughter, and daughter; Anxiety disorder in her mother and sister; Asthma in her daughter; Breast cancer in her maternal grandmother; Cancer in her maternal grandmother and paternal grandmother; Depression in her mother; Heart attack in her mother; Heart disease in her maternal grandmother; Heart failure in her maternal grandmother; High Cholesterol in her father and mother; Hydrocephalus in her niece; Hypertension in her father and mother; Seizures in her daughter.  ROS:   Review of  Systems  Constitution: Negative for decreased appetite, fever and weight gain.  HENT: Negative for congestion, ear discharge, hoarse voice and sore throat.   Eyes: Negative for discharge, redness, vision loss in right eye and visual halos.  Cardiovascular: Negative for chest pain, dyspnea on exertion, leg swelling, orthopnea and palpitations.  Respiratory: Negative for cough, hemoptysis, shortness of breath and snoring.   Endocrine: Negative for heat intolerance and polyphagia.  Hematologic/Lymphatic: Negative for bleeding problem. Does not bruise/bleed easily.  Skin: Negative for flushing, nail changes, rash and suspicious lesions.  Musculoskeletal: Negative for arthritis, joint pain, muscle cramps, myalgias, neck pain and stiffness.  Gastrointestinal: Negative for abdominal pain, bowel incontinence, diarrhea and excessive appetite.  Genitourinary: Negative for decreased libido, genital sores and incomplete emptying.  Neurological: Negative for brief paralysis, focal weakness, headaches and loss of balance.  Psychiatric/Behavioral: Negative for altered mental status, depression and suicidal ideas.  Allergic/Immunologic: Negative for HIV exposure and persistent infections.    EKGs/Labs/Other Studies Reviewed:    The following studies were reviewed today:   EKG:  The ekg ordered today demonstrates    ZIO monitor 11/14/2021 Patch Wear Time:  7 days and 22 hours (2023-07-24T14:17:12-398 to 2023-08-01T12:53:34-0400) Patient had a min HR of 69 bpm, max HR of 129 bpm, and avg HR of 90 bpm. Predominant underlying rhythm was Sinus Rhythm. Isolated SVEs were rare (<1.0%), SVE Couplets were rare (<1.0%), and no SVE Triplets were present. Isolated VEs were frequent (8.2%,  84618), VE Couplets were rare (<1.0%, 8), and no VE Triplets were present. Ventricular Bigeminy and Trigeminy were present.  Symptoms associated with frequent PVCs. Conclusion: Symptomatic frequent PVCs.  TTE 09/22/2021 IMPRESSIONS     1. Left ventricular ejection fraction, by estimation, is 60 to 65%. The  left ventricle has normal function. The left ventricle has no regional  wall motion abnormalities. Left ventricular diastolic parameters were  normal.   2. Right ventricular systolic function is normal. The right ventricular  size is normal.   3. The mitral valve is normal in structure. Trivial mitral valve  regurgitation. No evidence of mitral stenosis.   4. The aortic valve is normal in structure. Aortic valve regurgitation is  not visualized. No aortic stenosis is present.   5. The inferior vena cava is normal in size with greater than 50%  respiratory variability, suggesting right atrial pressure of 3 mmHg.   FINDINGS   Left Ventricle: Left ventricular ejection fraction, by estimation, is 60  to 65%. The left ventricle has normal function. The left ventricle has no  regional wall motion abnormalities. The left ventricular internal cavity  size was normal in size. There is   no left ventricular hypertrophy. Left ventricular diastolic parameters  were normal.   Right Ventricle: The right ventricular size is normal. No increase in  right ventricular wall thickness. Right ventricular systolic function is  normal.   Left Atrium: Left atrial size was normal in size.   Right Atrium: Right atrial size was normal in  size.   Pericardium: There is no evidence of pericardial effusion.   Mitral Valve: The mitral valve is normal in structure. Trivial mitral  valve regurgitation. No evidence of mitral valve stenosis.   Tricuspid Valve: The tricuspid valve is normal in structure. Tricuspid  valve regurgitation is not demonstrated. No evidence of tricuspid  stenosis.   Aortic Valve: The aortic valve is normal in structure. Aortic valve  regurgitation is not visualized. No aortic stenosis is present.   Pulmonic Valve: The pulmonic  valve was normal in structure. Pulmonic valve  regurgitation is not visualized. No evidence of pulmonic stenosis.   Aorta: The aortic root is normal in size and structure.   Venous: The inferior vena cava is normal in size with greater than 50%  respiratory variability, suggesting right atrial pressure of 3 mmHg.   IAS/Shunts: No atrial level shunt detected by color flow Doppler.       Recent Labs: 03/07/2022: TSH 3.360 05/27/2022: ALT 22; Hemoglobin 13.3; Platelets 278 07/23/2022: BUN 12; Creatinine, Ser 0.68; Magnesium WILL FOLLOW; Potassium 4.2; Sodium 139  Recent Lipid Panel    Component Value Date/Time   CHOL 188 09/30/2020 1811   TRIG 181 (H) 09/30/2020 1811   HDL 53 09/30/2020 1811   CHOLHDL 3.5 09/30/2020 1811   VLDL 36 09/30/2020 1811   LDLCALC 99 09/30/2020 1811   LDLCALC 115 (H) 11/01/2017 1051    Physical Exam:    VS:  BP 128/84   Pulse 73   Ht  (1.626 m)   Wt 198 lb 6.4 oz (90 kg)   SpO2 98%   BMI 34.06 kg/m     Wt Readings from Last 3 Encounters:  07/23/22 198 lb 6.4 oz (90 kg)  06/26/22 207 lb (93.9 kg)  05/30/22 212 lb 8 oz (96.4 kg)     GEN: Well nourished, well developed in no acute distress HEENT: Normal NECK: No JVD; No carotid bruits LYMPHATICS: No lymphadenopathy CARDIAC: S1S2 noted,RRR, no murmurs, rubs, gallops RESPIRATORY:  Clear to auscultation without rales, wheezing or rhonchi  ABDOMEN: Soft, non-tender,  non-distended, +bowel sounds, no guarding. EXTREMITIES: No edema, No cyanosis, no clubbing MUSCULOSKELETAL:  No deformity  SKIN: Warm and dry NEUROLOGIC:  Alert and oriented x 3, non-focal PSYCHIATRIC:  Normal affect, good insight  ASSESSMENT:    1. Medication management   2. Burning chest pain   3. Frequent PVCs   4. Obesity (BMI 30-39.9)      PLAN:   Her chest discomfort does sound like GERD.  Will do a trial of pantoprazole for the patient if this improves her symptoms we will transition that prescription to her PCP.  But will bring her back in 4 months for follow-up.  If this does not I will send her for coronary CTA.  Her blood pressure is at target today.  The patient is in agreement with the above plan. The patient left the office in stable condition.  The patient will follow up in  $ months or sooner if needed.  Medication Adjustments/Labs and Tests Ordered: Current medicines are reviewed at length with the patient today.  Concerns regarding medicines are outlined above.  Orders Placed This Encounter  Procedures   Basic Metabolic Panel (BMET)   Magnesium   Meds ordered this encounter  Medications   pantoprazole (PROTONIX) 20 MG tablet    Sig: Take 1 tablet (20 mg total) by mouth daily.    Dispense:  90 tablet    Refill:  3    Patient Instructions  Medication Instructions:  Your physician has recommended you make the following change in your medication:  START: Pantoprazole 20 mg once daily *If you need a refill on your cardiac medications before your next appointment, please call your pharmacy*   Lab Work: Your physician recommends that you have labs drawn today: BMET, Mag If you have labs (blood work) drawn today and your tests are completely normal, you will receive your results only by: MyChart Message (if you have MyChart) OR A  paper copy in the mail If you have any lab test that is abnormal or we need to change your treatment, we will call you to review  the results.   Testing/Procedures: None   Follow-Up: At [facility], you and your health needs are our priority.  As part of our continuing mission to provide you with exceptional heart care, we have created designated Provider Care Teams.  These Care Teams include your primary Cardiologist (physician) and Advanced Practice Providers (APPs -  Physician Assistants and Nurse Practitioners) who all work together to provide you with the care you need, when you need it.   Your next appointment:   4 month(s)  Provider:   Thomasene Ripple, DO     Adopting a Healthy Lifestyle.  Know what a healthy weight is for you (roughly BMI <25) and aim to maintain this   Aim for 7+ servings of fruits and vegetables daily   65-80+ fluid ounces of water or unsweet tea for healthy kidneys   Limit to max 1 drink of alcohol per day; avoid smoking/tobacco   Limit animal fats in diet for cholesterol and heart health - choose grass fed whenever available   Avoid highly processed foods, and foods high in saturated/trans fats   Aim for low stress - take time to unwind and care for your mental health   Aim for 150 min of moderate intensity exercise weekly for heart health, and weights twice weekly for bone health   Aim for 7-9 hours of sleep daily   When it comes to diets, agreement about the perfect plan isnt easy to find, even among the experts. Experts at the [facility] [employer] developed an idea known as the Healthy Eating Plate. Just imagine a plate divided into logical, healthy portions.   The emphasis is on diet quality:   Load up on vegetables and fruits - one-half of your plate: Aim for color and variety, and remember that potatoes dont count.   Go for whole grains - one-quarter of your plate: Whole wheat, barley, wheat berries, quinoa, oats, brown rice, and foods made with them. If you want pasta, go with whole wheat pasta.   Protein power - one-quarter of your plate:  Fish, chicken, beans, and nuts are all healthy, versatile protein sources. Limit red meat.   The diet, however, does go beyond the plate, offering a few other suggestions.   Use healthy plant oils, such as olive, canola, soy, corn, sunflower and peanut. Check the labels, and avoid partially hydrogenated oil, which have unhealthy trans fats.   If youre thirsty, drink water. Coffee and tea are good in moderation, but skip sugary drinks and limit milk and dairy products to one or two daily servings.   The type of carbohydrate in the diet is more important than the amount. Some sources of carbohydrates, such as vegetables, fruits, whole grains, and beans-are healthier than others.   Finally, stay active  Signed, Thomasene Ripple, DO  07/24/2022 8:26 AM    [facility]

## 2022-09-05 DIAGNOSIS — Z299 Encounter for prophylactic measures, unspecified: Secondary | ICD-10-CM | POA: Diagnosis not present

## 2022-09-05 DIAGNOSIS — F419 Anxiety disorder, unspecified: Secondary | ICD-10-CM | POA: Diagnosis not present

## 2022-09-05 DIAGNOSIS — H6522 Chronic serous otitis media, left ear: Secondary | ICD-10-CM | POA: Diagnosis not present

## 2022-09-12 ENCOUNTER — Telehealth: Payer: Self-pay | Admitting: *Deleted

## 2022-09-12 NOTE — Telephone Encounter (Signed)
Ok to clear

## 2022-09-12 NOTE — Telephone Encounter (Signed)
Received clearance request for cataract extraction by PE, IOL-Left under topical with IV sedation with Dr Georges Mouse on 09/28/22. They are requesting clearance based on patient being under treatment for IIH.   Last visit note from 06/26/22:  "The medication helps the diamox 250mg  tid. No papilledema at [facility] 06/01/2022. She has cataracts which is impairing vision but otherwise the pressure and headaches are better. She is having a hard time taking 250mg  3x a day and still a little pressure esp when laying down, so I changed to 500mg  bid if she cannot tolerate that dose she can take 250mg  in the morning and 500mg  at bedtime. "

## 2022-09-13 NOTE — Telephone Encounter (Signed)
Form checked pt cleared medically for the above procedure.

## 2022-09-13 NOTE — Telephone Encounter (Signed)
Fax confirmation received to [facility].

## 2022-09-28 DIAGNOSIS — Q120 Congenital cataract: Secondary | ICD-10-CM | POA: Diagnosis not present

## 2022-09-28 DIAGNOSIS — H2512 Age-related nuclear cataract, left eye: Secondary | ICD-10-CM | POA: Diagnosis not present

## 2022-09-28 HISTORY — PX: CATARACT EXTRACTION: SUR2

## 2022-10-19 DIAGNOSIS — Q120 Congenital cataract: Secondary | ICD-10-CM | POA: Diagnosis not present

## 2022-10-19 DIAGNOSIS — H2511 Age-related nuclear cataract, right eye: Secondary | ICD-10-CM | POA: Diagnosis not present

## 2022-10-29 NOTE — Patient Instructions (Incomplete)
Below is our plan:  We will will restart Diamox 250mg  daily for 2 weeks. If well tolerated, increase dose to 125mg  (1/2 tablet in the mornings and continue full tablet at bedtime for 2 weeks. If well tolerated, increase dose to 250mg  twice daily.   Please make sure you are staying well hydrated. I recommend 50-60 ounces daily. Well balanced diet and regular exercise encouraged. Consistent sleep schedule with 6-8 hours recommended.   Please continue follow up with care team as directed.   Follow up with me in 3 months   You may receive a survey regarding today's visit. I encourage you to leave honest feed back as I do use this information to improve patient care. Thank you for seeing me today!

## 2022-10-29 NOTE — Progress Notes (Unsigned)
No chief complaint on file.   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 She was last seen yb Dr Lucia Gaskins 06/2022. Eye exam was stable. Diamox switched from 250mg  TID to 500mg  BID. Since,   05/30/2022 ALL: [name] returns for follow up for IIH. She was last seen by Dr Lucia Gaskins last week. She has continued Diamox 125mg  BID. Could not tolerate topiramate due to blurred vision. She reports headaches are significantly improved. She is tolerating Diamox. No vision changes. She states last eye exam showed improved edema.   She was seen by ER 05/27/2022 for left leg weakness. She reported 3 days history of persistent weakness causing difficulty walking. No sensory changes, no other areas of weakness and no vision changes. She reported similar symptoms 2.5 weeks prior that resolved spontaneously. Exam was unremarkable with exception of 4+/5 strength of left hip flexor. Labs unremarkable. MRI was advised to rule out myelopathy. She initially agreed but then declined due to wait time and anxiety. She reports weakness and numbness sensation continues to come and go. She tells me that her left leg felt weak, yesterday, and as if it was asleep but feels normal today. She is having low back pain that comes and goes around site of LP. She had a spinal during childbirth 01/2022. Pain has waxed and waned since. She denies radicular symptoms.   04/26/2022 ALL: [name] is a 42 y.o. female here today for follow up for IIH. LP on 04/19/2022 showed opening pressure of 28. She was started on topiramate 50mg  BID (Diamox avoided due to sulfa allergy). She had blood patch 02/23/2023. Last eye exam 04/23/2022 and reports edema had improved. Today she reports doing better. She feels vision has improved. She continues to have some blurred vision and milder headaches. She is tolerating topiramate. She has not seen PCP recently. She is trying to focus on reducing calories. She reports having a  tubal with last delivery. She has 7 children.   HISTORY (copied from Dr Trevor Mace previous note)  HPI:  [name] is a 42 y.o. female here as requested by Conley Rolls, [place], OD for bilateral optic nerve edema. 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  I reviewed [facility] Dr. Juanetta Snow notes, patient complained of constant headache for several months, best corrected visual acuities were 20/20 in the right eye and 20/20 in the left eye at distance and near.  Extraocular muscles were full and unrestricted, pupils were equal round and reactive to light with no apparent defect, tonometry measured 20 mmHg and right and left eye, slit-lamp examination were unremarkable, optic disc had a point to 2.2 cup-to-disc ratio in the right eye with moderate optic nerve head edema same in the left eye.  No findings of optic nerve head pallor or optic nerve hemorrhages noted at this time.  Ocular findings demonstrated moderate optic nerve head edema in both eyes, the macula was clear in both eyes.  The peripheral retinas of both eyes were normal.   Having headaches started before having baby in October. 6 months. She has severe headaches. Her vision is blurry, sometimes double, pressure around the head, daily, san be severe, she has been having feelings like her ears stopped up. Dizziness. Vision was 20/20 with Dr. Conley Rolls. No weakness, no problems  swallowing, happening daily sometimes continuous. Pressure in a band around the head. Worse positionally like bending over, nothing helps. No other focal neurologic deficits, associated symptoms, inciting events or modifiable factors.   Reviewed notes, labs and imaging from outside physicians, which showed:   CT head 04/30/2010:  Clinical Data: Headache.   HEAD CT WITHOUT CONTRAST:  Technique: Contiguous axial images were obtained from the base of the skull through the vertex according to standard protocol without contrast.  Findings: There is no evidence of intracranial hemorrhage, brain edema, acute infarct, mass lesion, or mass effect.  No other intra-axial abnormalities are seen, and the ventricles are within normal limits.  No abnormal extra-axial fluid collections or masses are identified.  No skull abnormalities are noted.  IMPRESSION:  Negative non-contrast head CT.    REVIEW OF SYSTEMS: Out of a complete 14 system review of symptoms, the patient complains only of the following symptoms, headaches, left leg weakness, numbness of left leg, low back pain and all other reviewed systems are negative.   ALLERGIES: Allergies  Allergen Reactions   Sulfamethoxazole-Trimethoprim Swelling and Rash     HOME MEDICATIONS: Outpatient Medications Prior to Visit  Medication Sig Dispense Refill   acetaminophen (TYLENOL) 500 MG tablet Take 1 tablet (500 mg total) by mouth every 6 (six) hours as needed for mild pain. 30 tablet 0   acetaZOLAMIDE (DIAMOX) 250 MG tablet Take 2 tablets (500 mg total) by mouth 2 (two) times daily. 120 tablet 11   ALPRAZolam (XANAX) 0.25 MG tablet Take 0.25 mg by mouth at bedtime as needed for anxiety.     cycloSPORINE (RESTASIS) 0.05 % ophthalmic emulsion Place 1 drop into both eyes 2 (two) times daily.     FLUoxetine (PROZAC) 40 MG capsule Take 40 mg by mouth daily.     loratadine (CLARITIN) 10 MG tablet Take 10 mg by mouth daily as needed for allergies.     nortriptyline (PAMELOR) 10 MG capsule Take 1 capsule (10 mg total) by mouth at bedtime. 30 capsule 3   pantoprazole (PROTONIX) 20 MG tablet Take 1 tablet (20 mg total) by mouth daily. 90 tablet 3   propranolol (INDERAL) 10 MG tablet Take 10 mg by mouth daily.     rizatriptan (MAXALT-MLT) 10 MG disintegrating tablet Take 1 tablet (10 mg  total) by mouth as needed for migraine. May repeat in 2 hours if needed 12 tablet 11   No facility-administered medications prior to visit.     PAST MEDICAL HISTORY: Past Medical History:  Diagnosis Date   Anemia    Anxiety "fast hearbeat"   took Benadryl at end of pregnancy   Back pain affecting pregnancy in first trimester 05/27/2015   Calculus of gallbladder without cholecystitis without obstruction 05/31/2019   Chlamydia 08/03/2021   06/21/21 @ [facility]  Only took 3 doxycycline (d/t n/v)   POC 08/03/21: neg   Complication of anesthesia    Epidural 1 sided   Depression    During 1 pregnancy   Diabetes mellitus without complication (HCC)    Dysrhythmia    Endometriosis    Gallstones    Gestational diabetes    just started checking BS 03/05/12   Headache(784.0)    Hematuria 05/27/2015   History of gestational diabetes in prior pregnancy, currently pregnant 06/21/2015   History of kidney stones    Irregular periods    Pregnancy induced hypertension    Recurrent upper respiratory infection (URI)    Mostly when pregnant   Spotting affecting pregnancy  in first trimester 01/27/2015     PAST SURGICAL HISTORY: Past Surgical History:  Procedure Laterality Date   APPENDECTOMY Right 09/03/2021   CESAREAN SECTION WITH BILATERAL TUBAL LIGATION N/A 01/18/2022   Procedure: CESAREAN SECTION WITH BILATERAL TUBAL LIGATION;  Surgeon: Myna Hidalgo, DO;  Location: [facility];  Service: Obstetrics;  Laterality: N/A;   CHOLECYSTECTOMY N/A 03/20/2019   Procedure: LAPAROSCOPIC CHOLECYSTECTOMY;  Surgeon: Franky Macho, MD;  Location: [facility];  Service: General;  Laterality: N/A;   LAPAROSCOPIC APPENDECTOMY N/A 09/03/2021   Procedure: APPENDECTOMY LAPAROSCOPIC;  Surgeon: Axel Filler, MD;  Location: [facility] OR;  Service: General;  Laterality: N/A;   MULTIPLE TOOTH EXTRACTIONS     WISDOM TOOTH EXTRACTION       FAMILY HISTORY: Family History  Problem Relation Age of Onset   Hypertension Mother     Anxiety disorder Mother    Depression Mother    Heart attack Mother    High Cholesterol Mother    Hypertension Father    High Cholesterol Father    Anxiety disorder Sister    Heart disease Maternal Grandmother    Cancer Maternal Grandmother        brain   Heart failure Maternal Grandmother    Breast cancer Maternal Grandmother    Cancer Paternal Grandmother        brain   ADD / ADHD Daughter    Asthma Daughter    ADD / ADHD Daughter    Seizures Daughter    ADD / ADHD Daughter    ADD / ADHD Daughter    Hydrocephalus Niece      SOCIAL HISTORY: Social History   Socioeconomic History   Marital status: Divorced    Spouse name: Not on file   Number of children: Not on file   Years of education: Not on file   Highest education level: Not on file  Occupational History   Not on file  Tobacco Use   Smoking status: Former    Current packs/day: 0.00    Average packs/day: 0.1 packs/day for 0.6 years (0.1 ttl pk-yrs)    Types: Cigarettes    Start date: 02/19/1998    Quit date: 09/17/1998    Years since quitting: 24.1   Smokeless tobacco: Never  Vaping Use   Vaping status: Never Used  Substance and Sexual Activity   Alcohol use: Not Currently    Comment: not while pregnant   Drug use: No   Sexual activity: Not Currently    Partners: Male    Birth control/protection: None  Other Topics Concern   Not on file  Social History Narrative   Lives in [place], [place]   Does not work outside home.   Married for over 3 years.    Wear seatbelt.   Eats all food groups.   Enjoys dance.    Social Determinants of Health   Financial Resource Strain: Low Risk  (01/19/2020)   Overall Financial Resource Strain (CARDIA)    Difficulty of Paying Living Expenses: Not very hard  Food Insecurity: No Food Insecurity (08/03/2021)   Hunger Vital Sign    Worried About Running Out of Food in the Last Year: Never true    Ran Out of Food in the Last Year: Never true  Transportation Needs: No  Transportation Needs (08/03/2021)   PRAPARE - Administrator, Civil Service (Medical): No    Lack of Transportation (Non-Medical): No  Physical Activity: Insufficiently Active (08/03/2021)   Exercise Vital Sign  Days of Exercise per Week: 1 day    Minutes of Exercise per Session: 10 min  Stress: No Stress Concern Present (08/03/2021)   [employer] of Occupational Health - Occupational Stress Questionnaire    Feeling of Stress : Only a little  Social Connections: Unknown (04/03/2022)   Received from [facility]   Social Network    Social Network: Not on file  Intimate Partner Violence: Unknown (04/03/2022)   Received from [facility]   HITS    Physically Hurt: Not on file    Insult or Talk Down To: Not on file    Threaten Physical Harm: Not on file    Scream or Curse: Not on file     PHYSICAL EXAM  There were no vitals filed for this visit.   There is no height or weight on file to calculate BMI.  Generalized: Well developed, in no acute distress  Cardiology: normal rate and rhythm, no murmur auscultated  Respiratory: clear to auscultation bilaterally    Neurological examination  Mentation: Alert oriented to time, place, history taking. Follows all commands speech and language fluent Cranial nerve II-XII: Pupils were equal round reactive to light. Extraocular movements were full, visual field were full on confrontational test. Facial sensation and strength were normal. Head turning and shoulder shrug  were normal and symmetric. Motor: The motor testing reveals 5 over 5 strength of all 4 extremities. Good symmetric motor tone is noted throughout.  Sensory: Sensory testing is intact to soft touch on all 4 extremities with exception of left lateral thigh reportedly decreased. Pinprick sensation reported as symmetric bilaterally.  No evidence of extinction is noted.  Coordination: Cerebellar testing reveals good finger-nose-finger and heel-to-shin bilaterally.   Gait and station: Able to stand unassisted and without using arms to push. Gait is normal. Tandem normal.  DTR: brisk reflexes in both upper and lower ext.    DIAGNOSTIC DATA (LABS, IMAGING, TESTING) - I reviewed patient records, labs, notes, testing and imaging myself where available.  Lab Results  Component Value Date   WBC 7.5 05/27/2022   HGB 13.3 05/27/2022   HCT 39.0 05/27/2022   MCV 85.5 05/27/2022   PLT 278 05/27/2022      Component Value Date/Time   NA 139 07/23/2022 1035   K 4.2 07/23/2022 1035   CL 106 07/23/2022 1035   CO2 20 07/23/2022 1035   GLUCOSE 85 07/23/2022 1035   GLUCOSE 94 05/27/2022 1824   BUN 12 07/23/2022 1035   CREATININE 0.68 07/23/2022 1035   CREATININE 0.66 11/01/2017 1051   CALCIUM 9.7 07/23/2022 1035   PROT 7.4 05/27/2022 1653   PROT 7.6 03/07/2022 1517   ALBUMIN 4.0 05/27/2022 1653   ALBUMIN 4.7 03/07/2022 1517   AST 16 05/27/2022 1653   ALT 22 05/27/2022 1653   ALKPHOS 39 05/27/2022 1653   BILITOT <0.1 (L) 05/27/2022 1653   BILITOT <0.2 03/07/2022 1517   GFRNONAA >60 05/27/2022 1653   GFRNONAA 113 11/01/2017 1051   GFRAA >60 03/07/2019 0520   GFRAA 131 11/01/2017 1051   Lab Results  Component Value Date   CHOL 188 09/30/2020   HDL 53 09/30/2020   LDLCALC 99 09/30/2020   TRIG 181 (H) 09/30/2020   CHOLHDL 3.5 09/30/2020   Lab Results  Component Value Date   HGBA1C 5.7 (H) 08/03/2021   No results found for: "VITAMINB12" Lab Results  Component Value Date   TSH 3.360 03/07/2022        No data to display  No data to display           ASSESSMENT AND PLAN  42 y.o. year old female  has a past medical history of Anemia, Anxiety ("fast hearbeat"), Back pain affecting pregnancy in first trimester (05/27/2015), Calculus of gallbladder without cholecystitis without obstruction (05/31/2019), Chlamydia (08/03/2021), Complication of anesthesia, Depression, Diabetes mellitus without complication (HCC),  Dysrhythmia, Endometriosis, Gallstones, Gestational diabetes, Headache(784.0), Hematuria (05/27/2015), History of gestational diabetes in prior pregnancy, currently pregnant (06/21/2015), History of kidney stones, Irregular periods, Pregnancy induced hypertension, Recurrent upper respiratory infection (URI), and Spotting affecting pregnancy in first trimester (01/27/2015). here with   No diagnosis found.  [name] reports headaches are significantly improved since LP and vision disturbance improved after switching topiramate to acetazolamide. She is tolerating well with no obvious adverse effects. She will continue acetazolimide 125mg  BID. She may continue Tylenol or ibuprofen as needed but advised against regular use. She reports transient left leg weakness with numbness for the past 3-4 weeks. Also having low back pain but no radicular symptoms. We will order an MRI to rule out demyelinating disease. Discussed PT if she wishes. Healthy lifestyle habits encouraged. She will follow up with PCP as directed. She will return to see Dr Lucia Gaskins as scheduled 06/2022. She verbalizes understanding and agreement with this plan.   No orders of the defined types were placed in this encounter.    No orders of the defined types were placed in this encounter.    Shawnie Dapper, MSN, FNP-C 10/29/2022, 4:30 PM  [facility] [address] [place], [place] [address] [phone]

## 2022-10-30 ENCOUNTER — Encounter: Payer: Self-pay | Admitting: Family Medicine

## 2022-10-30 ENCOUNTER — Ambulatory Visit: Payer: Medicaid Other | Admitting: Family Medicine

## 2022-10-30 VITALS — BP 127/82 | HR 83 | Ht 64.0 in | Wt 196.5 lb

## 2022-10-30 DIAGNOSIS — G932 Benign intracranial hypertension: Secondary | ICD-10-CM

## 2022-10-30 MED ORDER — ACETAZOLAMIDE 250 MG PO TABS: 250.0000 mg | ORAL_TABLET | Freq: Two times a day (BID) | ORAL | 3 refills | Status: AC

## 2022-11-22 ENCOUNTER — Ambulatory Visit: Payer: Medicaid Other | Admitting: Cardiology

## 2022-11-22 VITALS — BP 124/76 | HR 79 | Ht 64.5 in | Wt 192.8 lb

## 2022-11-22 DIAGNOSIS — R002 Palpitations: Secondary | ICD-10-CM | POA: Diagnosis not present

## 2022-11-22 DIAGNOSIS — I493 Ventricular premature depolarization: Secondary | ICD-10-CM

## 2022-11-22 NOTE — Progress Notes (Signed)
Cardiology Office Note:    Date:  11/25/2022   ID:  [name], DOB 01/18/1981, MRN [id number]  PCP:  Golden Pop, FNP  Cardiologist:  Thomasene Ripple, DO  Electrophysiologist:  None   Referring MD: Golden Pop, FNP   " I am doing well"  History of Present Illness:    [name] is a 42 y.o. female with a hx of gestational diabetes, chronic hypertension in pregnancy, symptomatic PVCs and on her ZIO monitor, better response to propanolol.    She is here for follow-up visit today.  She is here with her mother.  She offers no complaints at this time.  Past Medical History:  Diagnosis Date   Anemia    Anxiety "fast hearbeat"   took Benadryl at end of pregnancy   Back pain affecting pregnancy in first trimester 05/27/2015   Calculus of gallbladder without cholecystitis without obstruction 05/31/2019   Chlamydia 08/03/2021   06/21/21 @ [facility]  Only took 3 doxycycline (d/t n/v)   POC 08/03/21: neg   Complication of anesthesia    Epidural 1 sided   Depression    During 1 pregnancy   Diabetes mellitus without complication (HCC)    Dysrhythmia    Endometriosis    Gallstones    Gestational diabetes    just started checking BS 03/05/12   Headache(784.0)    Hematuria 05/27/2015   History of gestational diabetes in prior pregnancy, currently pregnant 06/21/2015   History of kidney stones    Irregular periods    Pregnancy induced hypertension    Recurrent upper respiratory infection (URI)    Mostly when pregnant   Spotting affecting pregnancy in first trimester 01/27/2015    Past Surgical History:  Procedure Laterality Date   APPENDECTOMY Right 09/03/2021   CATARACT EXTRACTION  09/28/2022   10/19/2022   CESAREAN SECTION WITH BILATERAL TUBAL LIGATION N/A 01/18/2022   Procedure: CESAREAN SECTION WITH BILATERAL TUBAL LIGATION;  Surgeon: Myna Hidalgo, DO;  Location: [facility];  Service: Obstetrics;  Laterality: N/A;   CHOLECYSTECTOMY N/A 03/20/2019   Procedure:  LAPAROSCOPIC CHOLECYSTECTOMY;  Surgeon: Franky Macho, MD;  Location: [facility];  Service: General;  Laterality: N/A;   LAPAROSCOPIC APPENDECTOMY N/A 09/03/2021   Procedure: APPENDECTOMY LAPAROSCOPIC;  Surgeon: Axel Filler, MD;  Location: [facility] OR;  Service: General;  Laterality: N/A;   MULTIPLE TOOTH EXTRACTIONS     WISDOM TOOTH EXTRACTION      Current Medications: Current Meds  Medication Sig   acetaminophen (TYLENOL) 500 MG tablet Take 1 tablet (500 mg total) by mouth every 6 (six) hours as needed for mild pain.   acetaZOLAMIDE (DIAMOX) 250 MG tablet Take 1 tablet (250 mg total) by mouth 2 (two) times daily.   cycloSPORINE (RESTASIS) 0.05 % ophthalmic emulsion Place 1 drop into both eyes 2 (two) times daily.   FLUoxetine (PROZAC) 40 MG capsule Take 40 mg by mouth daily.   loratadine (CLARITIN) 10 MG tablet Take 10 mg by mouth daily as needed for allergies.   propranolol (INDERAL) 10 MG tablet Take 10 mg by mouth daily.     Allergies:   Sulfamethoxazole-trimethoprim   Social History   Socioeconomic History   Marital status: Divorced    Spouse name: Not on file   Number of children: Not on file   Years of education: Not on file   Highest education level: Not on file  Occupational History   Not on file  Tobacco Use   Smoking status: Former  Current packs/day: 0.00    Average packs/day: 0.1 packs/day for 0.6 years (0.1 ttl pk-yrs)    Types: Cigarettes    Start date: 02/19/1998    Quit date: 09/17/1998    Years since quitting: 24.2   Smokeless tobacco: Never  Vaping Use   Vaping status: Never Used  Substance and Sexual Activity   Alcohol use: Not Currently    Comment: not while pregnant   Drug use: No   Sexual activity: Not Currently    Partners: Male    Birth control/protection: None  Other Topics Concern   Not on file  Social History Narrative   Lives in [place], [place]   Does not work outside home.   Married for over 3 years.    Wear seatbelt.   Eats all food  groups.   Enjoys dance.    Social Determinants of Health   Financial Resource Strain: Low Risk  (01/19/2020)   Overall Financial Resource Strain (CARDIA)    Difficulty of Paying Living Expenses: Not very hard  Food Insecurity: No Food Insecurity (08/03/2021)   Hunger Vital Sign    Worried About Running Out of Food in the Last Year: Never true    Ran Out of Food in the Last Year: Never true  Transportation Needs: No Transportation Needs (08/03/2021)   PRAPARE - Administrator, Civil Service (Medical): No    Lack of Transportation (Non-Medical): No  Physical Activity: Insufficiently Active (08/03/2021)   Exercise Vital Sign    Days of Exercise per Week: 1 day    Minutes of Exercise per Session: 10 min  Stress: No Stress Concern Present (08/03/2021)   [employer] of Occupational Health - Occupational Stress Questionnaire    Feeling of Stress : Only a little  Social Connections: Unknown (04/03/2022)   Received from [facility]   Social Network    Social Network: Not on file     Family History: The patient's family history includes ADD / ADHD in her daughter, daughter, daughter, and daughter; Anxiety disorder in her mother and sister; Asthma in her daughter; Breast cancer in her maternal grandmother; Cancer in her maternal grandmother and paternal grandmother; Depression in her mother; Heart attack in her mother; Heart disease in her maternal grandmother; Heart failure in her maternal grandmother; High Cholesterol in her father and mother; Hydrocephalus in her niece; Hypertension in her father and mother; Seizures in her daughter.  ROS:   Review of Systems  Constitution: Negative for decreased appetite, fever and weight gain.  HENT: Negative for congestion, ear discharge, hoarse voice and sore throat.   Eyes: Negative for discharge, redness, vision loss in right eye and visual halos.  Cardiovascular: Negative for chest pain, dyspnea on exertion, leg swelling, orthopnea  and palpitations.  Respiratory: Negative for cough, hemoptysis, shortness of breath and snoring.   Endocrine: Negative for heat intolerance and polyphagia.  Hematologic/Lymphatic: Negative for bleeding problem. Does not bruise/bleed easily.  Skin: Negative for flushing, nail changes, rash and suspicious lesions.  Musculoskeletal: Negative for arthritis, joint pain, muscle cramps, myalgias, neck pain and stiffness.  Gastrointestinal: Negative for abdominal pain, bowel incontinence, diarrhea and excessive appetite.  Genitourinary: Negative for decreased libido, genital sores and incomplete emptying.  Neurological: Negative for brief paralysis, focal weakness, headaches and loss of balance.  Psychiatric/Behavioral: Negative for altered mental status, depression and suicidal ideas.  Allergic/Immunologic: Negative for HIV exposure and persistent infections.    EKGs/Labs/Other Studies Reviewed:    The following studies were reviewed today:  EKG:  The ekg ordered today demonstrates   ZIO monitor 11/14/2021 Patch Wear Time:  7 days and 22 hours (2023-07-24T14:17:12-398 to 2023-08-01T12:53:34-0400) Patient had a min HR of 69 bpm, max HR of 129 bpm, and avg HR of 90 bpm. Predominant underlying rhythm was Sinus Rhythm. Isolated SVEs were rare (<1.0%), SVE Couplets were rare (<1.0%), and no SVE Triplets were present. Isolated VEs were frequent (8.2%,  84618), VE Couplets were rare (<1.0%, 8), and no VE Triplets were present. Ventricular Bigeminy and Trigeminy were present.  Symptoms associated with frequent PVCs. Conclusion: Symptomatic frequent PVCs.  TTE 09/22/2021 IMPRESSIONS     1. Left ventricular ejection fraction, by estimation, is 60 to 65%. The  left ventricle has normal function. The left ventricle has no regional  wall motion abnormalities. Left ventricular diastolic parameters were  normal.   2. Right ventricular systolic function is normal. The right ventricular  size is normal.    3. The mitral valve is normal in structure. Trivial mitral valve  regurgitation. No evidence of mitral stenosis.   4. The aortic valve is normal in structure. Aortic valve regurgitation is  not visualized. No aortic stenosis is present.   5. The inferior vena cava is normal in size with greater than 50%  respiratory variability, suggesting right atrial pressure of 3 mmHg.   FINDINGS   Left Ventricle: Left ventricular ejection fraction, by estimation, is 60  to 65%. The left ventricle has normal function. The left ventricle has no  regional wall motion abnormalities. The left ventricular internal cavity  size was normal in size. There is   no left ventricular hypertrophy. Left ventricular diastolic parameters  were normal.   Right Ventricle: The right ventricular size is normal. No increase in  right ventricular wall thickness. Right ventricular systolic function is  normal.   Left Atrium: Left atrial size was normal in size.   Right Atrium: Right atrial size was normal in size.   Pericardium: There is no evidence of pericardial effusion.   Mitral Valve: The mitral valve is normal in structure. Trivial mitral  valve regurgitation. No evidence of mitral valve stenosis.   Tricuspid Valve: The tricuspid valve is normal in structure. Tricuspid  valve regurgitation is not demonstrated. No evidence of tricuspid  stenosis.   Aortic Valve: The aortic valve is normal in structure. Aortic valve  regurgitation is not visualized. No aortic stenosis is present.   Pulmonic Valve: The pulmonic valve was normal in structure. Pulmonic valve  regurgitation is not visualized. No evidence of pulmonic stenosis.   Aorta: The aortic root is normal in size and structure.   Venous: The inferior vena cava is normal in size with greater than 50%  respiratory variability, suggesting right atrial pressure of 3 mmHg.   IAS/Shunts: No atrial level shunt detected by color flow Doppler.       Recent  Labs: 03/07/2022: TSH 3.360 05/27/2022: ALT 22; Hemoglobin 13.3; Platelets 278 07/23/2022: BUN 12; Creatinine, Ser 0.68; Magnesium 1.9; Potassium 4.2; Sodium 139  Recent Lipid Panel    Component Value Date/Time   CHOL 188 09/30/2020 1811   TRIG 181 (H) 09/30/2020 1811   HDL 53 09/30/2020 1811   CHOLHDL 3.5 09/30/2020 1811   VLDL 36 09/30/2020 1811   LDLCALC 99 09/30/2020 1811   LDLCALC 115 (H) 11/01/2017 1051    Physical Exam:    VS:  BP 124/76 (BP Location: Right Arm, Patient Position: Sitting, Cuff Size: Normal)   Pulse 79   Ht 5'  4.5" (1.638 m)   Wt 192 lb 12.8 oz (87.5 kg)   SpO2 99%   BMI 32.58 kg/m     Wt Readings from Last 3 Encounters:  11/22/22 192 lb 12.8 oz (87.5 kg)  10/30/22 196 lb 8 oz (89.1 kg)  07/23/22 198 lb 6.4 oz (90 kg)     GEN: Well nourished, well developed in no acute distress HEENT: Normal NECK: No JVD; No carotid bruits LYMPHATICS: No lymphadenopathy CARDIAC: S1S2 noted,RRR, no murmurs, rubs, gallops RESPIRATORY:  Clear to auscultation without rales, wheezing or rhonchi  ABDOMEN: Soft, non-tender, non-distended, +bowel sounds, no guarding. EXTREMITIES: No edema, No cyanosis, no clubbing MUSCULOSKELETAL:  No deformity  SKIN: Warm and dry NEUROLOGIC:  Alert and oriented x 3, non-focal PSYCHIATRIC:  Normal affect, good insight  ASSESSMENT:    1. Palpitations   2. Frequent PVCs      PLAN:     Her blood pressure is at target today.  The patient is in agreement with the above plan. The patient left the office in stable condition.  The patient will follow up in  12months or sooner if needed.  Medication Adjustments/Labs and Tests Ordered: Current medicines are reviewed at length with the patient today.  Concerns regarding medicines are outlined above.  Orders Placed This Encounter  Procedures   EKG 12-Lead   No orders of the defined types were placed in this encounter.   Patient Instructions  Medication Instructions:   Your  physician recommends that you continue on your current medications as directed. Please refer to the Current Medication list given to you today.   *If you need a refill on your cardiac medications before your next appointment, please call your pharmacy*   Lab Work:  None ordered.  If you have labs (blood work) drawn today and your tests are completely normal, you will receive your results only by: MyChart Message (if you have MyChart) OR A paper copy in the mail If you have any lab test that is abnormal or we need to change your treatment, we will call you to review the results.   Testing/Procedures:  None ordered.   Follow-Up: At [facility], you and your health needs are our priority.  As part of our continuing mission to provide you with exceptional heart care, we have created designated Provider Care Teams.  These Care Teams include your primary Cardiologist (physician) and Advanced Practice Providers (APPs -  Physician Assistants and Nurse Practitioners) who all work together to provide you with the care you need, when you need it.  We recommend signing up for the patient portal called "MyChart".  Sign up information is provided on this After Visit Summary.  MyChart is used to connect with patients for Virtual Visits (Telemedicine).  Patients are able to view lab/test results, encounter notes, upcoming appointments, etc.  Non-urgent messages can be sent to your provider as well.   To learn more about what you can do with MyChart, go to [email or web address].    Your next appointment:   9 month(s)  Provider:   Thomasene Ripple, DO     Other Instructions  Your physician wants you to follow-up in: 9-12 months with Dr. Servando Salina.  You will receive a reminder letter in the mail two months in advance. If you don't receive a letter, please call our office to schedule the follow-up appointment.     Adopting a Healthy Lifestyle.  Know what a healthy weight is for you (roughly BMI  <25) and aim  to maintain this   Aim for 7+ servings of fruits and vegetables daily   65-80+ fluid ounces of water or unsweet tea for healthy kidneys   Limit to max 1 drink of alcohol per day; avoid smoking/tobacco   Limit animal fats in diet for cholesterol and heart health - choose grass fed whenever available   Avoid highly processed foods, and foods high in saturated/trans fats   Aim for low stress - take time to unwind and care for your mental health   Aim for 150 min of moderate intensity exercise weekly for heart health, and weights twice weekly for bone health   Aim for 7-9 hours of sleep daily   When it comes to diets, agreement about the perfect plan isnt easy to find, even among the experts. Experts at the [facility] [employer] developed an idea known as the Healthy Eating Plate. Just imagine a plate divided into logical, healthy portions.   The emphasis is on diet quality:   Load up on vegetables and fruits - one-half of your plate: Aim for color and variety, and remember that potatoes dont count.   Go for whole grains - one-quarter of your plate: Whole wheat, barley, wheat berries, quinoa, oats, brown rice, and foods made with them. If you want pasta, go with whole wheat pasta.   Protein power - one-quarter of your plate: Fish, chicken, beans, and nuts are all healthy, versatile protein sources. Limit red meat.   The diet, however, does go beyond the plate, offering a few other suggestions.   Use healthy plant oils, such as olive, canola, soy, corn, sunflower and peanut. Check the labels, and avoid partially hydrogenated oil, which have unhealthy trans fats.   If youre thirsty, drink water. Coffee and tea are good in moderation, but skip sugary drinks and limit milk and dairy products to one or two daily servings.   The type of carbohydrate in the diet is more important than the amount. Some sources of carbohydrates, such as vegetables, fruits, whole  grains, and beans-are healthier than others.   Finally, stay active  Signed, Thomasene Ripple, DO  11/25/2022 7:45 PM    [facility]

## 2022-11-22 NOTE — Patient Instructions (Signed)
Medication Instructions:   Your physician recommends that you continue on your current medications as directed. Please refer to the Current Medication list given to you today.   *If you need a refill on your cardiac medications before your next appointment, please call your pharmacy*   Lab Work:  None ordered.  If you have labs (blood work) drawn today and your tests are completely normal, you will receive your results only by: MyChart Message (if you have MyChart) OR A paper copy in the mail If you have any lab test that is abnormal or we need to change your treatment, we will call you to review the results.   Testing/Procedures:  None ordered.   Follow-Up: At [facility], you and your health needs are our priority.  As part of our continuing mission to provide you with exceptional heart care, we have created designated Provider Care Teams.  These Care Teams include your primary Cardiologist (physician) and Advanced Practice Providers (APPs -  Physician Assistants and Nurse Practitioners) who all work together to provide you with the care you need, when you need it.  We recommend signing up for the patient portal called "MyChart".  Sign up information is provided on this After Visit Summary.  MyChart is used to connect with patients for Virtual Visits (Telemedicine).  Patients are able to view lab/test results, encounter notes, upcoming appointments, etc.  Non-urgent messages can be sent to your provider as well.   To learn more about what you can do with MyChart, go to [email or web address].    Your next appointment:   9 month(s)  Provider:   Thomasene Ripple, DO     Other Instructions  Your physician wants you to follow-up in: 9-12 months with Dr. Servando Salina.  You will receive a reminder letter in the mail two months in advance. If you don't receive a letter, please call our office to schedule the follow-up appointment.

## 2022-11-25 ENCOUNTER — Encounter: Payer: Self-pay | Admitting: Cardiology

## 2022-12-13 DIAGNOSIS — Z79899 Other long term (current) drug therapy: Secondary | ICD-10-CM | POA: Diagnosis not present

## 2022-12-13 DIAGNOSIS — Z299 Encounter for prophylactic measures, unspecified: Secondary | ICD-10-CM | POA: Diagnosis not present

## 2022-12-13 DIAGNOSIS — Z1331 Encounter for screening for depression: Secondary | ICD-10-CM | POA: Diagnosis not present

## 2022-12-13 DIAGNOSIS — Z0000 Encounter for general adult medical examination without abnormal findings: Secondary | ICD-10-CM | POA: Diagnosis not present

## 2022-12-13 DIAGNOSIS — D509 Iron deficiency anemia, unspecified: Secondary | ICD-10-CM | POA: Diagnosis not present

## 2022-12-13 DIAGNOSIS — F419 Anxiety disorder, unspecified: Secondary | ICD-10-CM | POA: Diagnosis not present

## 2023-01-15 NOTE — Progress Notes (Deleted)
No chief complaint on file.   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 She was last seen 10/2022 and doing well. She had discontinued Diamox 250mg  daily prior to cataract surgery but was advised to restart. Headaches were very well managed at that time. 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 She was last seen yb Dr Lucia Gaskins 06/2022. Eye exam was stable. Diamox switched from 250mg  TID to 500mg  BID. Since, she reports doing well. Headaches are stable. No pressure behind eyes or vision changes. She reports taking 250mg  BID for about a month but then stopped due to palpitations. Cardiology visit was unremarkable and she was advised to discuss with [place]. She continues 250mg  daily until about a month ago when she had cataract surgery.  She had surgery on left eye 09/28/2022. Right 10/19/22. She had follow up last week and reports no optic edema. She is considering Yag laser tx. She has not used rizatriptan recently.   05/30/2022 ALL: [name] returns for follow up for IIH. She was last seen by Dr Lucia Gaskins last week. She has continued Diamox 125mg  BID. Could not tolerate topiramate due to blurred vision. She reports headaches are significantly improved. She is tolerating Diamox. No vision changes. She states last eye exam showed improved edema.   She was seen by ER 05/27/2022 for left leg weakness. She reported 3 days history of persistent weakness causing difficulty walking. No sensory changes, no other areas of weakness and no vision changes. She reported similar symptoms 2.5 weeks prior that resolved spontaneously. Exam was unremarkable with exception of 4+/5 strength of left hip flexor. Labs unremarkable. MRI was advised to rule out myelopathy. She initially agreed but then declined due to wait time and anxiety. She reports weakness and numbness sensation continues to come and go. She tells me that her left leg felt weak, yesterday, and as if it was  asleep but feels normal today. She is having low back pain that comes and goes around site of LP. She had a spinal during childbirth 01/2022. Pain has waxed and waned since. She denies radicular symptoms.   04/26/2022 ALL: [name] is a 42 y.o. female here today for follow up for IIH. LP on 04/19/2022 showed opening pressure of 28. She was started on topiramate 50mg  BID (Diamox avoided due to sulfa allergy). She had blood patch 02/23/2023. Last eye exam 04/23/2022 and reports edema had improved. Today she reports doing better. She feels vision has improved. She continues to have some blurred vision and milder headaches. She is tolerating topiramate. She has not seen PCP recently. She is trying to focus on reducing calories. She reports having a tubal with last delivery. She has 7 children.   HISTORY (copied from Dr Trevor Mace previous note)  HPI:  [name] is a 42 y.o. female here as requested by Conley Rolls, [place], OD for bilateral optic nerve edema. 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  I reviewed [facility] Dr. Juanetta Snow notes, patient complained of constant headache for several months, best corrected visual acuities were 20/20 in the right eye and 20/20 in the left eye at distance and near.  Extraocular muscles were full and unrestricted, pupils were equal round and reactive to light with no apparent defect,  tonometry measured 20 mmHg and right and left eye, slit-lamp examination were unremarkable, optic disc had a point to 2.2 cup-to-disc ratio in the right eye with moderate optic nerve head edema same in the left eye.  No findings of optic nerve head pallor or optic nerve hemorrhages noted at this time.  Ocular findings demonstrated moderate  optic nerve head edema in both eyes, the macula was clear in both eyes.  The peripheral retinas of both eyes were normal.   Having headaches started before having baby in October. 6 months. She has severe headaches. Her vision is blurry, sometimes double, pressure around the head, daily, san be severe, she has been having feelings like her ears stopped up. Dizziness. Vision was 20/20 with Dr. Conley Rolls. No weakness, no problems swallowing, happening daily sometimes continuous. Pressure in a band around the head. Worse positionally like bending over, nothing helps. No other focal neurologic deficits, associated symptoms, inciting events or modifiable factors.   Reviewed notes, labs and imaging from outside physicians, which showed:   CT head 04/30/2010: Clinical Data: Headache.   HEAD CT WITHOUT CONTRAST:  Technique: Contiguous axial images were obtained from the base of the skull through the vertex according to standard protocol without contrast.  Findings: There is no evidence of intracranial hemorrhage, brain edema, acute infarct, mass lesion, or mass effect.  No other intra-axial abnormalities are seen, and the ventricles are within normal limits.  No abnormal extra-axial fluid collections or masses are identified.  No skull abnormalities are noted.  IMPRESSION:  Negative non-contrast head CT.    REVIEW OF SYSTEMS: Out of a complete 14 system review of symptoms, the patient complains only of the following symptoms, headaches, left leg weakness, numbness of left leg, low back pain and all other reviewed systems are negative.   ALLERGIES: Allergies  Allergen Reactions   Sulfamethoxazole-Trimethoprim Swelling and Rash     HOME MEDICATIONS: Outpatient Medications Prior to Visit  Medication Sig Dispense Refill   acetaminophen (TYLENOL) 500 MG tablet Take 1 tablet (500 mg total) by mouth every 6 (six) hours as needed for mild pain. 30 tablet 0   acetaZOLAMIDE (DIAMOX) 250 MG tablet Take 1 tablet  (250 mg total) by mouth 2 (two) times daily. 180 tablet 3   ALPRAZolam (XANAX) 0.25 MG tablet Take 0.25 mg by mouth at bedtime as needed for anxiety. (Patient not taking: Reported on 11/22/2022)     cycloSPORINE (RESTASIS) 0.05 % ophthalmic emulsion Place 1 drop into both eyes 2 (two) times daily.     FLUoxetine (PROZAC) 40 MG capsule Take 40 mg by mouth daily.     loratadine (CLARITIN) 10 MG tablet Take 10 mg by mouth daily as needed for allergies.     pantoprazole (PROTONIX) 20 MG tablet Take 1 tablet (20 mg total) by mouth daily. (Patient not taking: Reported on 11/22/2022) 90 tablet 3   propranolol (INDERAL) 10 MG tablet Take 10 mg by mouth daily.     rizatriptan (MAXALT-MLT) 10 MG disintegrating tablet Take 1 tablet (10 mg total) by mouth as needed for migraine. May repeat in 2 hours if needed (Patient not taking: Reported on 11/22/2022) 12 tablet 11   No facility-administered medications prior to visit.     PAST MEDICAL HISTORY: Past Medical History:  Diagnosis Date   Anemia    Anxiety "fast hearbeat"   took Benadryl at end of pregnancy   Back pain affecting pregnancy in first trimester 05/27/2015   Calculus of gallbladder without cholecystitis without obstruction  05/31/2019   Chlamydia 08/03/2021   06/21/21 @ [facility]  Only took 3 doxycycline (d/t n/v)   POC 08/03/21: neg   Complication of anesthesia    Epidural 1 sided   Depression    During 1 pregnancy   Diabetes mellitus without complication (HCC)    Dysrhythmia    Endometriosis    Gallstones    Gestational diabetes    just started checking BS 03/05/12   Headache(784.0)    Hematuria 05/27/2015   History of gestational diabetes in prior pregnancy, currently pregnant 06/21/2015   History of kidney stones    Irregular periods    Pregnancy induced hypertension    Recurrent upper respiratory infection (URI)    Mostly when pregnant   Spotting affecting pregnancy in first trimester 01/27/2015     PAST SURGICAL HISTORY: Past  Surgical History:  Procedure Laterality Date   APPENDECTOMY Right 09/03/2021   CATARACT EXTRACTION  09/28/2022   10/19/2022   CESAREAN SECTION WITH BILATERAL TUBAL LIGATION N/A 01/18/2022   Procedure: CESAREAN SECTION WITH BILATERAL TUBAL LIGATION;  Surgeon: Myna Hidalgo, DO;  Location: [facility];  Service: Obstetrics;  Laterality: N/A;   CHOLECYSTECTOMY N/A 03/20/2019   Procedure: LAPAROSCOPIC CHOLECYSTECTOMY;  Surgeon: Franky Macho, MD;  Location: [facility];  Service: General;  Laterality: N/A;   LAPAROSCOPIC APPENDECTOMY N/A 09/03/2021   Procedure: APPENDECTOMY LAPAROSCOPIC;  Surgeon: Axel Filler, MD;  Location: [facility] OR;  Service: General;  Laterality: N/A;   MULTIPLE TOOTH EXTRACTIONS     WISDOM TOOTH EXTRACTION       FAMILY HISTORY: Family History  Problem Relation Age of Onset   Hypertension Mother    Anxiety disorder Mother    Depression Mother    Heart attack Mother    High Cholesterol Mother    Hypertension Father    High Cholesterol Father    Anxiety disorder Sister    Heart disease Maternal Grandmother    Cancer Maternal Grandmother        brain   Heart failure Maternal Grandmother    Breast cancer Maternal Grandmother    Cancer Paternal Grandmother        brain   ADD / ADHD Daughter    Asthma Daughter    ADD / ADHD Daughter    Seizures Daughter    ADD / ADHD Daughter    ADD / ADHD Daughter    Hydrocephalus Niece      SOCIAL HISTORY: Social History   Socioeconomic History   Marital status: Divorced    Spouse name: Not on file   Number of children: Not on file   Years of education: Not on file   Highest education level: Not on file  Occupational History   Not on file  Tobacco Use   Smoking status: Former    Current packs/day: 0.00    Average packs/day: 0.1 packs/day for 0.6 years (0.1 ttl pk-yrs)    Types: Cigarettes    Start date: 02/19/1998    Quit date: 09/17/1998    Years since quitting: 24.3   Smokeless tobacco: Never  Vaping Use    Vaping status: Never Used  Substance and Sexual Activity   Alcohol use: Not Currently    Comment: not while pregnant   Drug use: No   Sexual activity: Not Currently    Partners: Male    Birth control/protection: None  Other Topics Concern   Not on file  Social History Narrative   Lives in [place], [place]   Does not work outside  home.   Married for over 3 years.    Wear seatbelt.   Eats all food groups.   Enjoys dance.    Social Determinants of Health   Financial Resource Strain: Low Risk  (01/19/2020)   Overall Financial Resource Strain (CARDIA)    Difficulty of Paying Living Expenses: Not very hard  Food Insecurity: No Food Insecurity (08/03/2021)   Hunger Vital Sign    Worried About Running Out of Food in the Last Year: Never true    Ran Out of Food in the Last Year: Never true  Transportation Needs: No Transportation Needs (08/03/2021)   PRAPARE - Administrator, Civil Service (Medical): No    Lack of Transportation (Non-Medical): No  Physical Activity: Insufficiently Active (08/03/2021)   Exercise Vital Sign    Days of Exercise per Week: 1 day    Minutes of Exercise per Session: 10 min  Stress: No Stress Concern Present (08/03/2021)   [employer] of Occupational Health - Occupational Stress Questionnaire    Feeling of Stress : Only a little  Social Connections: Unknown (04/03/2022)   [facility]   Social Network    Social Network: Not on file  Intimate Partner Violence: Unknown (04/03/2022)   Received from [facility], [facility]   HITS    Physically Hurt: Not on file    Insult or Talk Down To: Not on file    Threaten Physical Harm: Not on file    Scream or Curse: Not on file     PHYSICAL EXAM  There were no vitals filed for this visit.    There is no height or weight on file to calculate BMI.  Generalized: Well developed, in no acute distress  Cardiology: normal rate and rhythm, no murmur auscultated   Respiratory: clear to auscultation bilaterally    Neurological examination  Mentation: Alert oriented to time, place, history taking. Follows all commands speech and language fluent Cranial nerve II-XII: Pupils were equal round reactive to light. Extraocular movements were full, visual field were full on confrontational test. Facial sensation and strength were normal. Head turning and shoulder shrug  were normal and symmetric. Motor: The motor testing reveals 5 over 5 strength of all 4 extremities. Good symmetric motor tone is noted throughout.  Sensory: Sensory testing is intact to soft touch on all 4 extremities with exception of left lateral thigh reportedly decreased. Pinprick sensation reported as symmetric bilaterally.  No evidence of extinction is noted.  Coordination: Cerebellar testing reveals good finger-nose-finger and heel-to-shin bilaterally.  Gait and station: Able to stand unassisted and without using arms to push. Gait is normal. Tandem normal.  DTR: brisk reflexes in both upper and lower ext.    DIAGNOSTIC DATA (LABS, IMAGING, TESTING) - I reviewed patient records, labs, notes, testing and imaging myself where available.  Lab Results  Component Value Date   WBC 7.5 05/27/2022   HGB 13.3 05/27/2022   HCT 39.0 05/27/2022   MCV 85.5 05/27/2022   PLT 278 05/27/2022      Component Value Date/Time   NA 139 07/23/2022 1035   K 4.2 07/23/2022 1035   CL 106 07/23/2022 1035   CO2 20 07/23/2022 1035   GLUCOSE 85 07/23/2022 1035   GLUCOSE 94 05/27/2022 1824   BUN 12 07/23/2022 1035   CREATININE 0.68 07/23/2022 1035   CREATININE 0.66 11/01/2017 1051   CALCIUM 9.7 07/23/2022 1035   PROT 7.4 05/27/2022 1653   PROT 7.6 03/07/2022 1517  ALBUMIN 4.0 05/27/2022 1653   ALBUMIN 4.7 03/07/2022 1517   AST 16 05/27/2022 1653   ALT 22 05/27/2022 1653   ALKPHOS 39 05/27/2022 1653   BILITOT <0.1 (L) 05/27/2022 1653   BILITOT <0.2 03/07/2022 1517   GFRNONAA >60 05/27/2022 1653    GFRNONAA 113 11/01/2017 1051   GFRAA >60 03/07/2019 0520   GFRAA 131 11/01/2017 1051   Lab Results  Component Value Date   CHOL 188 09/30/2020   HDL 53 09/30/2020   LDLCALC 99 09/30/2020   TRIG 181 (H) 09/30/2020   CHOLHDL 3.5 09/30/2020   Lab Results  Component Value Date   HGBA1C 5.7 (H) 08/03/2021   No results found for: "VITAMINB12" Lab Results  Component Value Date   TSH 3.360 03/07/2022        No data to display               No data to display           ASSESSMENT AND PLAN  42 y.o. year old female  has a past medical history of Anemia, Anxiety ("fast hearbeat"), Back pain affecting pregnancy in first trimester (05/27/2015), Calculus of gallbladder without cholecystitis without obstruction (05/31/2019), Chlamydia (08/03/2021), Complication of anesthesia, Depression, Diabetes mellitus without complication (HCC), Dysrhythmia, Endometriosis, Gallstones, Gestational diabetes, Headache(784.0), Hematuria (05/27/2015), History of gestational diabetes in prior pregnancy, currently pregnant (06/21/2015), History of kidney stones, Irregular periods, Pregnancy induced hypertension, Recurrent upper respiratory infection (URI), and Spotting affecting pregnancy in first trimester (01/27/2015). here with   No diagnosis found.  [name] reports headaches and eye pressure have nearly resolved. She discontinued Diamox about a month ago in preparation for cataract surgery. I have encouraged her to resume 250mg  at bedtime for 2 weeks then increase dose to 125mg  in am and 250mg  at bedtime for two weeks then 250mg  BID. She may continue rizatriptan as needed for abortive therapy. Healthy lifestyle habits encouraged. She will follow up with PCP as directed. She will return to see me as scheduled 01/2023. She verbalizes understanding and agreement with this plan.   No orders of the defined types were placed in this encounter.    No orders of the defined types were placed in this  encounter.    Shawnie Dapper, MSN, FNP-C 01/15/2023, 11:41 AM  [facility] [address], [address] [place], [place] [address] [phone]

## 2023-01-17 ENCOUNTER — Ambulatory Visit: Payer: Medicaid Other | Admitting: Family Medicine

## 2023-01-17 DIAGNOSIS — G932 Benign intracranial hypertension: Secondary | ICD-10-CM

## 2023-03-14 DIAGNOSIS — R002 Palpitations: Secondary | ICD-10-CM | POA: Diagnosis not present

## 2023-03-14 DIAGNOSIS — F419 Anxiety disorder, unspecified: Secondary | ICD-10-CM | POA: Diagnosis not present

## 2023-03-14 DIAGNOSIS — Z299 Encounter for prophylactic measures, unspecified: Secondary | ICD-10-CM | POA: Diagnosis not present

## 2023-03-14 DIAGNOSIS — Z23 Encounter for immunization: Secondary | ICD-10-CM | POA: Diagnosis not present

## 2023-03-27 ENCOUNTER — Ambulatory Visit: Payer: Medicaid Other | Admitting: Family Medicine

## 2023-05-07 DIAGNOSIS — R0789 Other chest pain: Secondary | ICD-10-CM | POA: Diagnosis not present

## 2023-05-07 DIAGNOSIS — Z299 Encounter for prophylactic measures, unspecified: Secondary | ICD-10-CM | POA: Diagnosis not present

## 2023-05-07 DIAGNOSIS — F419 Anxiety disorder, unspecified: Secondary | ICD-10-CM | POA: Diagnosis not present

## 2023-08-21 DIAGNOSIS — Z299 Encounter for prophylactic measures, unspecified: Secondary | ICD-10-CM | POA: Diagnosis not present

## 2023-08-21 DIAGNOSIS — F419 Anxiety disorder, unspecified: Secondary | ICD-10-CM | POA: Diagnosis not present

## 2023-08-21 DIAGNOSIS — R52 Pain, unspecified: Secondary | ICD-10-CM | POA: Diagnosis not present

## 2023-08-21 DIAGNOSIS — N390 Urinary tract infection, site not specified: Secondary | ICD-10-CM | POA: Diagnosis not present

## 2023-08-21 DIAGNOSIS — R350 Frequency of micturition: Secondary | ICD-10-CM | POA: Diagnosis not present

## 2023-08-26 DIAGNOSIS — F419 Anxiety disorder, unspecified: Secondary | ICD-10-CM | POA: Diagnosis not present

## 2023-08-26 DIAGNOSIS — Z299 Encounter for prophylactic measures, unspecified: Secondary | ICD-10-CM | POA: Diagnosis not present

## 2023-08-26 DIAGNOSIS — I83819 Varicose veins of unspecified lower extremities with pain: Secondary | ICD-10-CM | POA: Diagnosis not present

## 2023-08-28 ENCOUNTER — Other Ambulatory Visit: Payer: Self-pay

## 2023-08-28 DIAGNOSIS — I872 Venous insufficiency (chronic) (peripheral): Secondary | ICD-10-CM

## 2023-09-12 ENCOUNTER — Ambulatory Visit (HOSPITAL_COMMUNITY)
Admission: RE | Admit: 2023-09-12 | Discharge: 2023-09-12 | Disposition: A | Discharge status: Home / Self Care | Source: Ambulatory Visit | Attending: Vascular Surgery | Admitting: Vascular Surgery

## 2023-09-12 DIAGNOSIS — I872 Venous insufficiency (chronic) (peripheral): Secondary | ICD-10-CM | POA: Diagnosis not present

## 2023-09-23 DIAGNOSIS — M2550 Pain in unspecified joint: Secondary | ICD-10-CM | POA: Diagnosis not present

## 2023-09-23 DIAGNOSIS — R52 Pain, unspecified: Secondary | ICD-10-CM | POA: Diagnosis not present

## 2023-09-23 DIAGNOSIS — R350 Frequency of micturition: Secondary | ICD-10-CM | POA: Diagnosis not present

## 2023-09-23 DIAGNOSIS — N390 Urinary tract infection, site not specified: Secondary | ICD-10-CM | POA: Diagnosis not present

## 2023-09-23 DIAGNOSIS — F419 Anxiety disorder, unspecified: Secondary | ICD-10-CM | POA: Diagnosis not present

## 2023-09-23 DIAGNOSIS — Z299 Encounter for prophylactic measures, unspecified: Secondary | ICD-10-CM | POA: Diagnosis not present

## 2023-10-07 IMAGING — DX DG CHEST 1V PORT
1 series · 1 of 1 positions shown · non-contrast
Comparison: 09/25/2013

CLINICAL DATA: Chest pain for 2 days

EXAM:
PORTABLE CHEST - 1 VIEW

[chest]
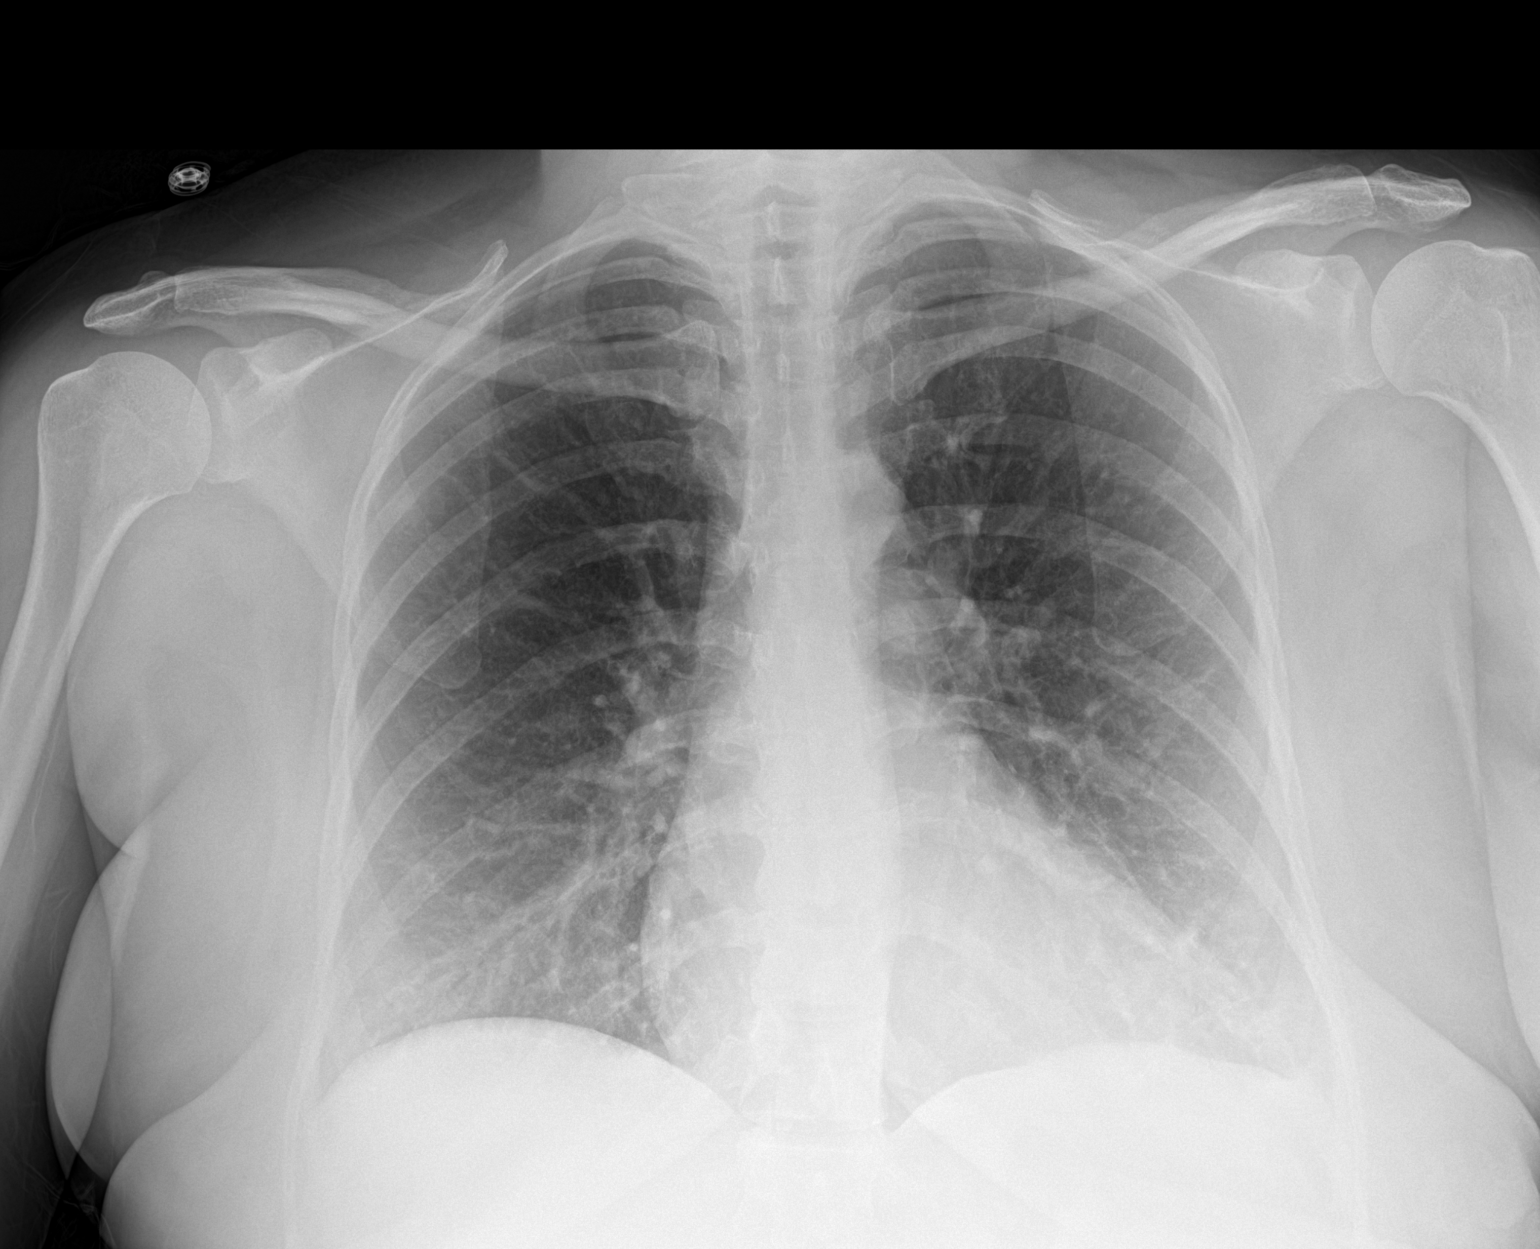

[1 of 1 positions shown; findings below may reference images not displayed]

FINDINGS: Cardiomediastinal silhouette and pulmonary vasculature are within
normal limits.

Lungs are clear.
IMPRESSION: No acute cardiopulmonary process.

## 2023-10-15 IMAGING — MR MR ABDOMEN W/O CM
9 series · 48 of 48 positions shown · non-contrast
Comparison: None Available.

CLINICAL DATA: Sixteen weeks pregnant. Abdominal pain which began
at 4 a.m. this morning. Vomiting. Right lower quadrant pain.

EXAM:
MRI ABDOMEN WITHOUT CONTRAST
TECHNIQUE: Multiplanar multisequence MR imaging was performed without the
administration of intravenous contrast.

[Series 4: cor haste · coronal · 6.0mm · 1.19mm/px · 3 of 34 slices shown]
[im 1/34]
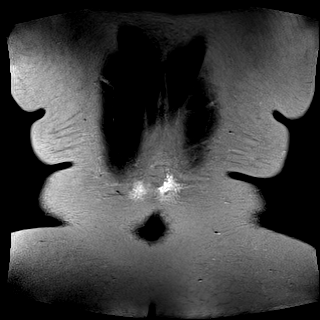
[im 17/34]
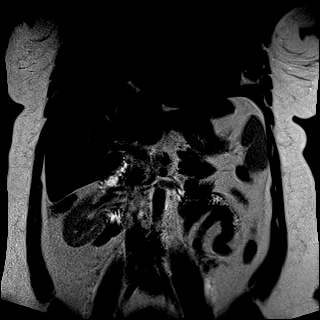
[im 34/34]
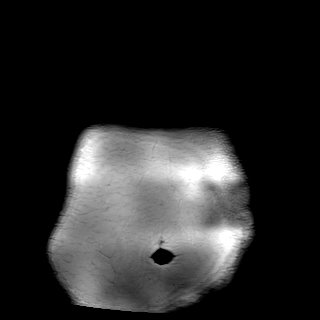

[Series 5: ax haste · axial · 6.0mm · 1.25mm/px · z∈[-208,+29]mm · 3 of 34 slices shown]
[im 1/34]
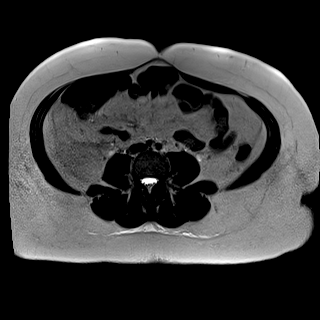
[im 17/34]
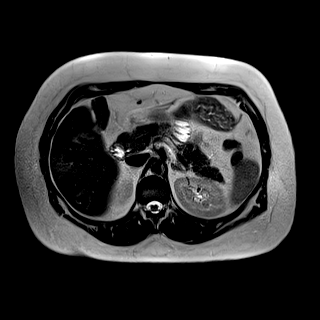
[im 34/34]
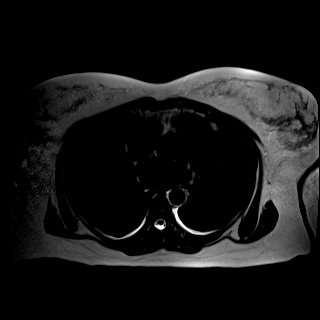

[Series 9: T2 fat-sat · axial · 6.0mm · 1.25mm/px · z∈[-208,+29]mm · 3 of 34 slices shown]
[im 1/34]
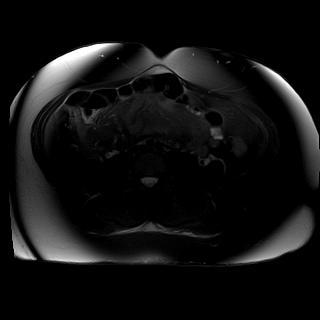
[im 17/34]
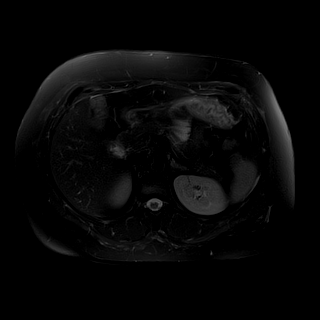
[im 34/34]
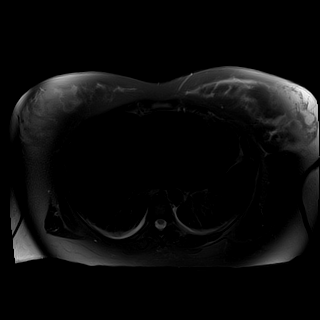

[Series 10: t1_vibe_opp-in_tra_p4_bh · axial · 3.0mm · 1.25mm/px · z∈[-220,+41]mm · 8 of 88 slices shown (1 of 2)]
[im 1/88]
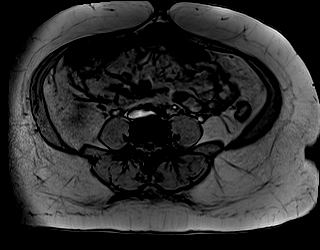
[im 13/88]
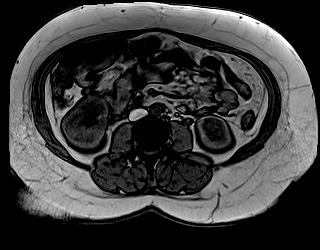
[im 25/88]
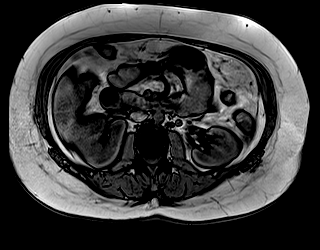
[im 38/88]
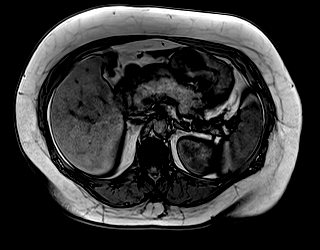
[im 50/88]
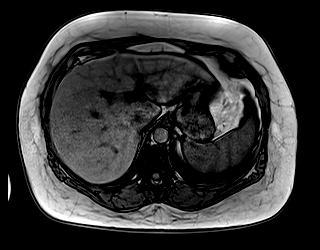
[im 63/88]
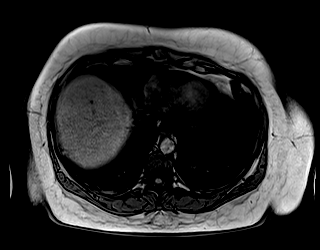
[im 75/88]
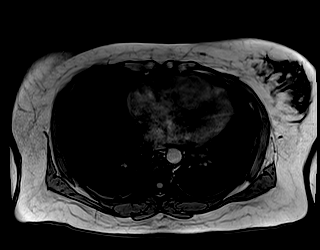
[im 88/88]
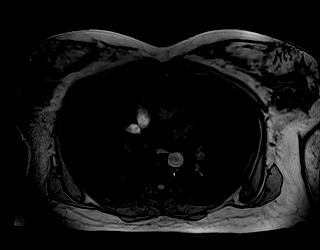

[Series 10: t1_vibe_opp-in_tra_p4_bh · axial · 3.0mm · 1.25mm/px · z∈[-220,+41]mm · 8 of 88 slices shown (2 of 2)]
[im 1/88]
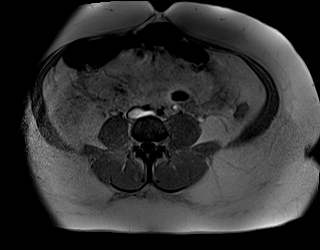
[im 13/88]
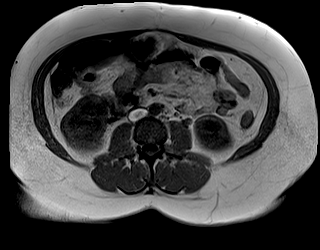
[im 25/88]
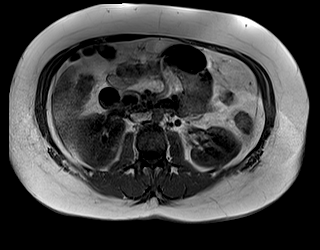
[im 38/88]
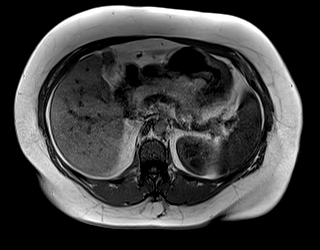
[im 50/88]
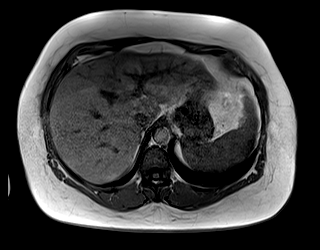
[im 63/88]
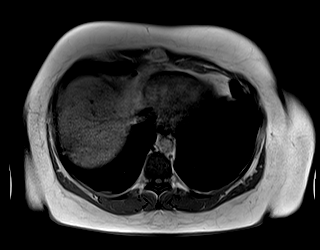
[im 75/88]
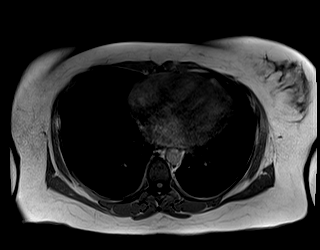
[im 88/88]
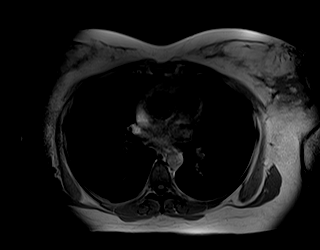

[Series 11: DWI · axial · 6.0mm · 1.49mm/px · z∈[-208,+29]mm · 9 of 102 slices shown (1 of 2)]
[im 1/102]
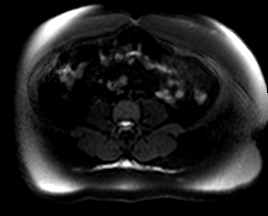
[im 13/102]
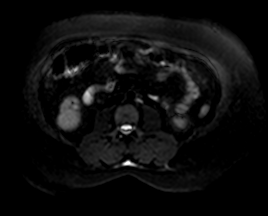
[im 26/102]
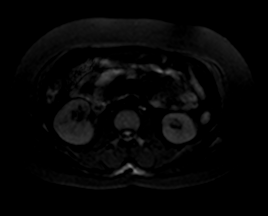
[im 38/102]
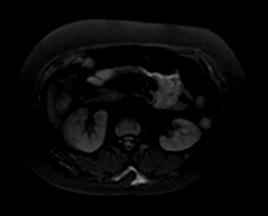
[im 51/102]
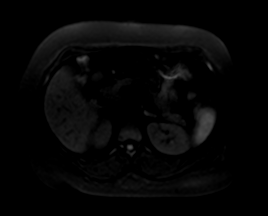
[im 64/102]
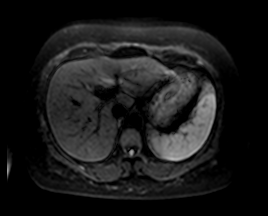
[im 76/102]
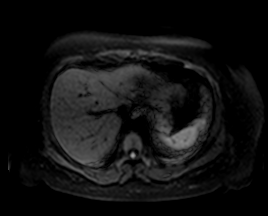
[im 89/102]
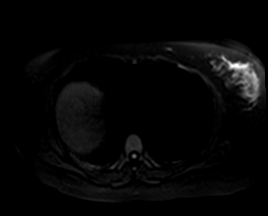
[im 102/102]
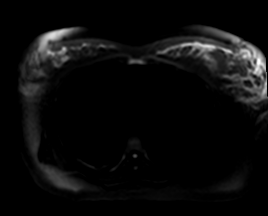

[Series 12: DWI · axial · 6.0mm · 1.49mm/px · z∈[-208,+29]mm · 3 of 34 slices shown (2 of 2)]
[im 1/34]
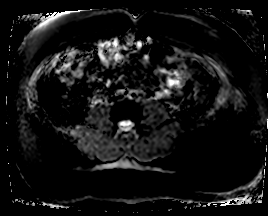
[im 17/34]
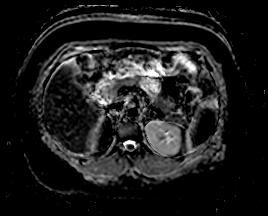
[im 34/34]
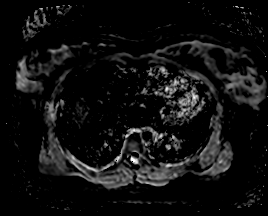

[Series 13: bSSFP · axial · 6.0mm · 0.78mm/px · z∈[-208,+29]mm · 3 of 34 slices shown]
[im 1/34]
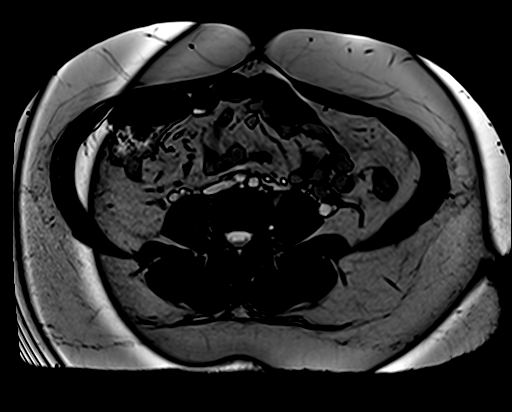
[im 17/34]
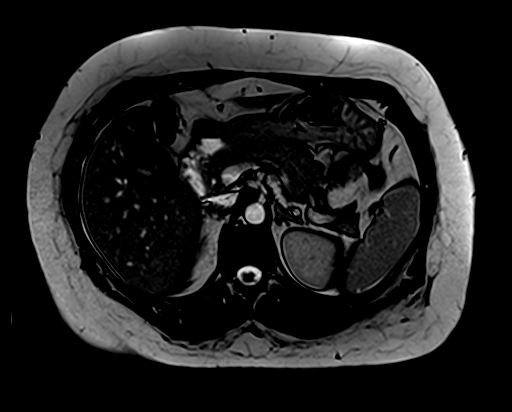
[im 34/34]
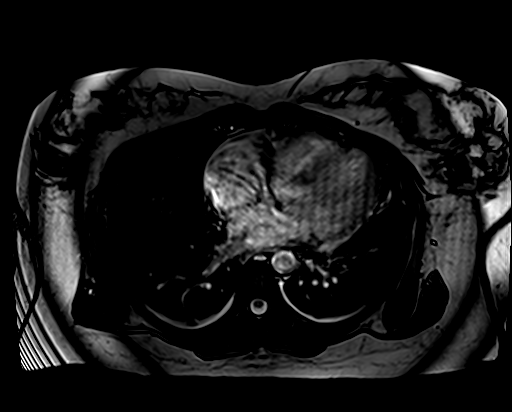

[Series 14: t1_vibe_fs_tra_p4_bh_pre · axial · 3.0mm · 1.19mm/px · z∈[-220,+41]mm · 8 of 88 slices shown]
[im 1/88]
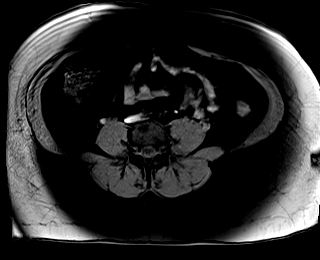
[im 13/88]
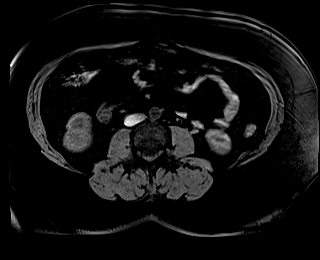
[im 25/88]
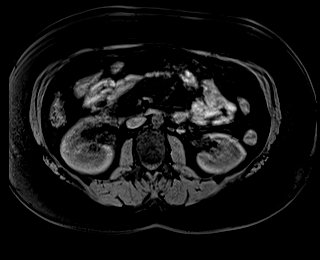
[im 38/88]
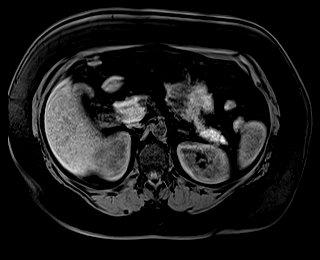
[im 50/88]
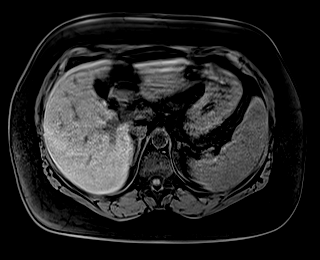
[im 63/88]
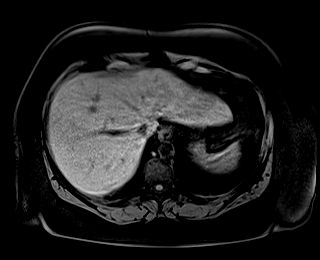
[im 75/88]
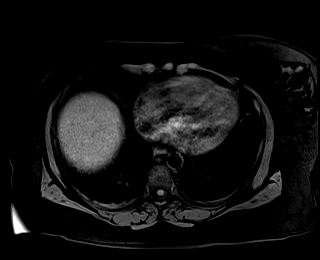
[im 88/88]
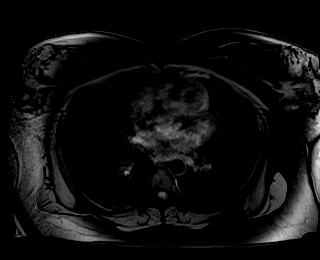

[48 of 48 positions shown; findings below may reference images not displayed]

FINDINGS: Lower chest: No acute findings.

Hepatobiliary: Status post cholecystectomy. No focal liver
abnormality identified. Mild hepatic steatosis. There is no bile
duct dilatation.

Pancreas: No mass, inflammatory changes, or other parenchymal
abnormality identified.

Spleen:  Within normal limits in size and appearance.

Adrenals/Urinary Tract: No masses identified. No evidence of
hydronephrosis.

Stomach/Bowel: Visualized portions within the abdomen are
unremarkable.

Vascular/Lymphatic: No pathologically enlarged lymph nodes
identified. No abdominal aortic aneurysm demonstrated.

Other:  No significant free fluid or signs of fluid collection.

Musculoskeletal: No suspicious bone lesions identified.
IMPRESSION: 1. No acute findings identified within the abdomen. Note: This exam
only includes images of the abdomen. Exam does not extend through
the pelvis. The pelvic bowel loops including the appendix are not
included on this exam.
2. Status post cholecystectomy.
3. Mild hepatic steatosis.

## 2023-10-22 ENCOUNTER — Ambulatory Visit: Attending: Vascular Surgery | Admitting: Vascular Surgery

## 2023-10-22 ENCOUNTER — Encounter: Payer: Self-pay | Admitting: Vascular Surgery

## 2023-10-22 VITALS — BP 116/81 | HR 72 | Temp 98.1°F | Ht 64.5 in | Wt 207.0 lb

## 2023-10-22 DIAGNOSIS — I872 Venous insufficiency (chronic) (peripheral): Secondary | ICD-10-CM | POA: Insufficient documentation

## 2023-10-22 NOTE — Progress Notes (Signed)
 [facility] [place]  ASSESSMENT / PLAN: [name] is a 43 y.o. female with chronic venous insufficiency of bilateral lower extremities causing swelling and venous claudication (C2 disease).  Venous duplex shows deep and superficial venous reflux.  The greater saphenous vein does not appear amenable to endovenous ablation.  Recommend compression and elevation for symptomatic relief.  Follow up with me as needed.  CHIEF COMPLAINT: Painful varicosities  HISTORY OF PRESENT ILLNESS: [name] is a 43 y.o. 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  The patient reports aching discomfort in the calves around the area of varicosities which worsens as the day goes on.  The legs feel tired and heavy.  He does note some swelling in the legs.  This is quite bothersome to her.  She has been encouraged to try compression therapy.  Past Medical History:  Diagnosis Date   Anemia    Anxiety fast hearbeat   took Benadryl  at end of pregnancy   Back pain affecting pregnancy in first trimester 05/27/2015   Calculus of gallbladder without cholecystitis without obstruction 05/31/2019   Chlamydia 08/03/2021   06/21/21 @ [facility]  Only took 3 doxycycline (d/t n/v)   POC 08/03/21: neg   Complication of anesthesia    Epidural 1 sided   Depression    During 1 pregnancy   Diabetes mellitus without complication (HCC)    Dysrhythmia    Endometriosis    Gallstones    Gestational diabetes    just started checking BS 03/05/12   Headache(784.0)    Hematuria 05/27/2015   History of gestational diabetes in prior pregnancy, currently pregnant 06/21/2015   History of kidney stones    Irregular periods    Pregnancy induced hypertension    Recurrent upper respiratory infection (URI)    Mostly when pregnant   Spotting affecting pregnancy in first trimester 01/27/2015    Past Surgical History:   Procedure Laterality Date   APPENDECTOMY Right 09/03/2021   CATARACT EXTRACTION  09/28/2022   10/19/2022   CESAREAN SECTION WITH BILATERAL TUBAL LIGATION N/A 01/18/2022   Procedure: CESAREAN SECTION WITH BILATERAL TUBAL LIGATION;  Surgeon: Ozan, Jennifer, DO;  Location: [facility];  Service: Obstetrics;  Laterality: N/A;   CHOLECYSTECTOMY N/A 03/20/2019   Procedure: LAPAROSCOPIC CHOLECYSTECTOMY;  Surgeon: Mavis Anes, MD;  Location: [facility];  Service: General;  Laterality: N/A;   LAPAROSCOPIC APPENDECTOMY N/A 09/03/2021   Procedure: APPENDECTOMY LAPAROSCOPIC;  Surgeon: Rubin Calamity, MD;  Location: [facility];  Service: General;  Laterality: N/A;   MULTIPLE TOOTH EXTRACTIONS     WISDOM TOOTH EXTRACTION      Family History  Problem Relation Age of Onset   Hypertension Mother    Anxiety disorder Mother    Depression Mother    Heart attack Mother    High Cholesterol Mother    Hypertension Father    High Cholesterol Father    Anxiety disorder Sister    Heart disease Maternal Grandmother    Cancer Maternal Grandmother        brain   Heart failure Maternal Grandmother    Breast cancer Maternal Grandmother    Cancer Paternal Grandmother        brain   ADD / ADHD Daughter    Asthma Daughter    ADD / ADHD Daughter    Seizures Daughter    ADD / ADHD Daughter    ADD / ADHD Daughter  Hydrocephalus Niece     Social History   Socioeconomic History   Marital status: Divorced    Spouse name: Not on file   Number of children: Not on file   Years of education: Not on file   Highest education level: Not on file  Occupational History   Not on file  Tobacco Use   Smoking status: Former    Current packs/day: 0.00    Average packs/day: 0.1 packs/day for 0.6 years (0.1 ttl pk-yrs)    Types: Cigarettes    Start date: 02/19/1998    Quit date: 09/17/1998    Years since quitting: 25.1   Smokeless tobacco: Never  Vaping Use   Vaping status: Never Used  Substance and Sexual Activity    Alcohol use: Not Currently    Comment: not while pregnant   Drug use: No   Sexual activity: Not Currently    Partners: Male    Birth control/protection: None  Other Topics Concern   Not on file  Social History Narrative   Lives in [place], [place]   Does not work outside home.   Married for over 3 years.    Wear seatbelt.   Eats all food groups.   Enjoys dance.    Social Drivers of Corporate investment banker Strain: Low Risk  (01/19/2020)   Overall Financial Resource Strain (CARDIA)    Difficulty of Paying Living Expenses: Not very hard  Food Insecurity: No Food Insecurity (08/03/2021)   Hunger Vital Sign    Worried About Running Out of Food in the Last Year: Never true    Ran Out of Food in the Last Year: Never true  Transportation Needs: No Transportation Needs (08/03/2021)   PRAPARE - Administrator, Civil Service (Medical): No    Lack of Transportation (Non-Medical): No  Physical Activity: Insufficiently Active (08/03/2021)   Exercise Vital Sign    Days of Exercise per Week: 1 day    Minutes of Exercise per Session: 10 min  Stress: No Stress Concern Present (08/03/2021)   [employer] of Occupational Health - Occupational Stress Questionnaire    Feeling of Stress : Only a little  Social Connections: Unknown (04/03/2022)   [facility]   Social Network    Social Network: Not on file  Intimate Partner Violence: Unknown (04/03/2022)   Received from [facility]   HITS    Physically Hurt: Not on file    Insult or Talk Down To: Not on file    Threaten Physical Harm: Not on file    Scream or Curse: Not on file    Allergies  Allergen Reactions   Sulfamethoxazole-Trimethoprim Swelling and Rash    Current Outpatient Medications  Medication Sig Dispense Refill   acetaminophen  (TYLENOL ) 500 MG tablet Take 1 tablet (500 mg total) by mouth every 6 (six) hours as needed for mild pain. 30 tablet 0   acetaZOLAMIDE  (DIAMOX ) 250 MG tablet Take 1  tablet (250 mg total) by mouth 2 (two) times daily. 180 tablet 3   ALPRAZolam  (XANAX ) 0.25 MG tablet Take 0.25 mg by mouth at bedtime as needed for anxiety. (Patient not taking: Reported on 11/22/2022)     cycloSPORINE (RESTASIS) 0.05 % ophthalmic emulsion Place 1 drop into both eyes 2 (two) times daily.     FLUoxetine  (PROZAC ) 40 MG capsule Take 40 mg by mouth daily.     loratadine  (CLARITIN ) 10 MG tablet Take 10 mg by mouth daily as needed for allergies.  pantoprazole  (PROTONIX ) 20 MG tablet Take 1 tablet (20 mg total) by mouth daily. (Patient not taking: Reported on 11/22/2022) 90 tablet 3   propranolol  (INDERAL ) 10 MG tablet Take 10 mg by mouth daily.     rizatriptan  (MAXALT -MLT) 10 MG disintegrating tablet Take 1 tablet (10 mg total) by mouth as needed for migraine. May repeat in 2 hours if needed (Patient not taking: Reported on 11/22/2022) 12 tablet 11   No current facility-administered medications for this visit.    PHYSICAL EXAM Vitals:   10/22/23 1347  BP: 116/81  Pulse: 72  Temp: 98.1 F (36.7 C)  SpO2: 98%  Weight: 207 lb (93.9 kg)  Height: 5' 4.5 (1.638 m)    Middle-age woman in no acute distress Regular rate and rhythm Unlabored breathing Palpable dorsalis pedis pulses bilaterally Prominent reticular veins about the calves bilaterally  PERTINENT LABORATORY AND RADIOLOGIC DATA  Most recent CBC    Latest Ref Rng & Units 05/27/2022    6:24 PM 05/27/2022    4:53 PM 03/07/2022    3:17 PM  CBC  WBC 4.0 - 10.5 K/uL  7.5  6.2   Hemoglobin 12.0 - 15.0 g/dL 86.6  86.3  86.2   Hematocrit 36.0 - 46.0 % 39.0  40.7  40.8   Platelets 150 - 400 K/uL  278  381      Most recent CMP    Latest Ref Rng & Units 07/23/2022   10:35 AM 05/27/2022    6:24 PM 05/27/2022    4:53 PM  CMP  Glucose 70 - 99 mg/dL 85  94  897   BUN 6 - 24 mg/dL 12  14  13    Creatinine 0.57 - 1.00 mg/dL 9.31  9.39  9.30   Sodium 134 - 144 mmol/L 139  141  137   Potassium 3.5 - 5.2 mmol/L 4.2  3.7  3.6    Chloride 96 - 106 mmol/L 106  108  107   CO2 20 - 29 mmol/L 20   19   Calcium  8.7 - 10.2 mg/dL 9.7   89.9   Total Protein 6.5 - 8.1 g/dL   7.4   Total Bilirubin 0.3 - 1.2 mg/dL   <9.8   Alkaline Phos 38 - 126 U/L   39   AST 15 - 41 U/L   16   ALT 0 - 44 U/L   22     Renal function CrCl cannot be calculated (Patient's most recent lab result is older than the maximum 21 days allowed.).  Hgb A1c MFr Bld (%)  Date Value  08/03/2021 5.7 (H)    LDL Cholesterol (Calc)  Date Value Ref Range Status  11/01/2017 115 (H) mg/dL (calc) Final    Comment:    Reference range: <100 . Desirable range <100 mg/dL for primary prevention;   <70 mg/dL for patients with CHD or diabetic patients  with > or = 2 CHD risk factors. SABRA LDL-C is now calculated using the Martin-Hopkins  calculation, which is a validated novel method providing  better accuracy than the Friedewald equation in the  estimation of LDL-C.  Gladis APPLETHWAITE et al. SANDREA. 7986;689(80): 2061-2068  (http://education.[facility].com/faq/FAQ164)    LDL Cholesterol  Date Value Ref Range Status  09/30/2020 99 0 - 99 mg/dL Final    Comment:           Total Cholesterol/HDL:CHD Risk Coronary Heart Disease Risk Table  Men   Women  1/2 Average Risk   3.4   3.3  Average Risk       5.0   4.4  2 X Average Risk   9.6   7.1  3 X Average Risk  23.4   11.0        Use the calculated Patient Ratio above and the CHD Risk Table to determine the patient's CHD Risk.        ATP III CLASSIFICATION (LDL):  <100     mg/dL   Optimal  899-870  mg/dL   Near or Above                    Optimal  130-159  mg/dL   Borderline  839-810  mg/dL   High  >809     mg/dL   Very High Performed at [facility], [address] [place], [place] [address]     Venous reflux duplex 09/12/2023  Left:  - No evidence of deep vein thrombosis seen in the left lower extremity,  from the common femoral through the popliteal  veins.  - No evidence of superficial venous thrombosis in the left lower  extremity.  - Venous reflux is noted in the left common femoral vein.  - Venous reflux is noted in the left sapheno-femoral junction.   Debby SAILOR. Magda, MD FACS [facility] Phone Number: [phone] 10/22/2023 4:40 PM   Total time spent on preparing this encounter including chart review, data review, collecting history, examining the patient, and coordinating care: 45 min  Portions of this report may have been transcribed using voice recognition software.  Every effort has been made to ensure accuracy; however, inadvertent computerized transcription errors may still be present.

## 2023-12-17 DIAGNOSIS — Z1331 Encounter for screening for depression: Secondary | ICD-10-CM | POA: Diagnosis not present

## 2023-12-17 DIAGNOSIS — Z79899 Other long term (current) drug therapy: Secondary | ICD-10-CM | POA: Diagnosis not present

## 2023-12-17 DIAGNOSIS — F419 Anxiety disorder, unspecified: Secondary | ICD-10-CM | POA: Diagnosis not present

## 2023-12-17 DIAGNOSIS — Z0000 Encounter for general adult medical examination without abnormal findings: Secondary | ICD-10-CM | POA: Diagnosis not present

## 2023-12-17 DIAGNOSIS — Z789 Other specified health status: Secondary | ICD-10-CM | POA: Diagnosis not present

## 2023-12-17 DIAGNOSIS — Z299 Encounter for prophylactic measures, unspecified: Secondary | ICD-10-CM | POA: Diagnosis not present

## 2023-12-18 DIAGNOSIS — N898 Other specified noninflammatory disorders of vagina: Secondary | ICD-10-CM | POA: Diagnosis not present

## 2023-12-19 ENCOUNTER — Other Ambulatory Visit: Payer: Self-pay | Admitting: Family Medicine

## 2023-12-19 DIAGNOSIS — Z1231 Encounter for screening mammogram for malignant neoplasm of breast: Secondary | ICD-10-CM

## 2023-12-31 ENCOUNTER — Ambulatory Visit
Admission: RE | Admit: 2023-12-31 | Discharge: 2023-12-31 | Disposition: A | Discharge status: Home / Self Care | Source: Ambulatory Visit | Attending: Family Medicine | Admitting: Family Medicine

## 2023-12-31 DIAGNOSIS — Z1231 Encounter for screening mammogram for malignant neoplasm of breast: Secondary | ICD-10-CM | POA: Diagnosis not present
